# Patient Record
Sex: Female | Born: 1945 | Race: White | Hispanic: No | Smoking: Never smoker
Health system: Southern US, Community
[De-identification: ages and names within clinical notes are randomized; demographics above are authoritative.]

## PROBLEM LIST (undated history)

## (undated) ENCOUNTER — Emergency Department (HOSPITAL_COMMUNITY): Disposition: A | Discharge status: Other Acute Inpatient Hospital | Payer: Medicare Other

## (undated) DIAGNOSIS — F411 Generalized anxiety disorder: Secondary | ICD-10-CM

## (undated) DIAGNOSIS — R079 Chest pain, unspecified: Secondary | ICD-10-CM

## (undated) DIAGNOSIS — Z860100 Personal history of colon polyps, unspecified: Secondary | ICD-10-CM

## (undated) DIAGNOSIS — E785 Hyperlipidemia, unspecified: Secondary | ICD-10-CM

## (undated) DIAGNOSIS — S065X9A Traumatic subdural hemorrhage with loss of consciousness of unspecified duration, initial encounter: Secondary | ICD-10-CM

## (undated) DIAGNOSIS — I10 Essential (primary) hypertension: Secondary | ICD-10-CM

## (undated) DIAGNOSIS — I2510 Atherosclerotic heart disease of native coronary artery without angina pectoris: Secondary | ICD-10-CM

## (undated) DIAGNOSIS — M810 Age-related osteoporosis without current pathological fracture: Secondary | ICD-10-CM

## (undated) HISTORY — DX: Chest pain, unspecified: R07.9

## (undated) HISTORY — PX: NASAL SINUS SURGERY: SHX719

## (undated) HISTORY — DX: Personal history of colon polyps, unspecified: Z86.0100

## (undated) HISTORY — DX: Personal history of colonic polyps: Z86.010

## (undated) HISTORY — DX: Essential (primary) hypertension: I10

## (undated) HISTORY — PX: POLYPECTOMY: SHX149

## (undated) HISTORY — DX: Atherosclerotic heart disease of native coronary artery without angina pectoris: I25.10

## (undated) HISTORY — DX: Generalized anxiety disorder: F41.1

## (undated) HISTORY — DX: Hyperlipidemia, unspecified: E78.5

## (undated) HISTORY — DX: Age-related osteoporosis without current pathological fracture: M81.0

---
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
## 2005-05-21 ENCOUNTER — Encounter: Payer: Self-pay | Admitting: Gastroenterology

## 2006-01-26 ENCOUNTER — Encounter: Admission: RE | Admit: 2006-01-26 | Discharge: 2006-01-26 | Payer: Self-pay | Admitting: Family Medicine

## 2007-02-08 ENCOUNTER — Encounter: Admission: RE | Admit: 2007-02-08 | Discharge: 2007-02-08 | Payer: Self-pay | Admitting: Family Medicine

## 2007-07-27 ENCOUNTER — Inpatient Hospital Stay (HOSPITAL_COMMUNITY): Admission: EM | Admit: 2007-07-27 | Discharge: 2007-08-02 | Payer: Self-pay | Admitting: Emergency Medicine

## 2007-07-27 ENCOUNTER — Ambulatory Visit: Payer: Self-pay | Admitting: Cardiothoracic Surgery

## 2007-07-27 ENCOUNTER — Encounter (INDEPENDENT_AMBULATORY_CARE_PROVIDER_SITE_OTHER): Payer: Self-pay | Admitting: Cardiology

## 2007-07-28 ENCOUNTER — Encounter: Payer: Self-pay | Admitting: Cardiothoracic Surgery

## 2007-08-18 ENCOUNTER — Encounter (HOSPITAL_COMMUNITY): Admission: RE | Admit: 2007-08-18 | Discharge: 2007-10-19 | Payer: Self-pay | Admitting: *Deleted

## 2007-08-26 ENCOUNTER — Ambulatory Visit: Payer: Self-pay | Admitting: Cardiothoracic Surgery

## 2007-08-26 ENCOUNTER — Encounter: Admission: RE | Admit: 2007-08-26 | Discharge: 2007-08-26 | Payer: Self-pay | Admitting: Cardiothoracic Surgery

## 2007-10-20 ENCOUNTER — Encounter (HOSPITAL_COMMUNITY): Admission: RE | Admit: 2007-10-20 | Discharge: 2008-01-18 | Payer: Self-pay | Admitting: *Deleted

## 2007-10-20 HISTORY — PX: CORONARY ARTERY BYPASS GRAFT: SHX141

## 2008-01-10 ENCOUNTER — Encounter: Payer: Self-pay | Admitting: Cardiology

## 2008-02-09 ENCOUNTER — Encounter: Admission: RE | Admit: 2008-02-09 | Discharge: 2008-02-09 | Payer: Self-pay | Admitting: Family Medicine

## 2008-06-27 ENCOUNTER — Ambulatory Visit: Payer: Self-pay | Admitting: Gastroenterology

## 2008-06-27 DIAGNOSIS — K5900 Constipation, unspecified: Secondary | ICD-10-CM

## 2008-06-27 DIAGNOSIS — Z860100 Personal history of colon polyps, unspecified: Secondary | ICD-10-CM | POA: Insufficient documentation

## 2008-06-27 LAB — CONVERTED CEMR LAB: TSH: 2.16 microintl units/mL (ref 0.35–5.50)

## 2008-07-04 ENCOUNTER — Encounter: Payer: Self-pay | Admitting: Gastroenterology

## 2008-07-06 ENCOUNTER — Telehealth: Payer: Self-pay | Admitting: Gastroenterology

## 2008-08-06 ENCOUNTER — Ambulatory Visit: Payer: Self-pay | Admitting: Gastroenterology

## 2008-10-19 HISTORY — PX: CORONARY ANGIOPLASTY WITH STENT PLACEMENT: SHX49

## 2009-02-11 ENCOUNTER — Encounter: Admission: RE | Admit: 2009-02-11 | Discharge: 2009-02-11 | Payer: Self-pay | Admitting: Family Medicine

## 2009-04-03 ENCOUNTER — Encounter: Payer: Self-pay | Admitting: Cardiology

## 2009-04-04 ENCOUNTER — Encounter: Payer: Self-pay | Admitting: Cardiology

## 2009-05-01 ENCOUNTER — Encounter: Payer: Self-pay | Admitting: Cardiology

## 2009-08-01 ENCOUNTER — Ambulatory Visit: Payer: Self-pay | Admitting: Cardiology

## 2009-08-01 DIAGNOSIS — E785 Hyperlipidemia, unspecified: Secondary | ICD-10-CM | POA: Insufficient documentation

## 2009-08-01 DIAGNOSIS — I2510 Atherosclerotic heart disease of native coronary artery without angina pectoris: Secondary | ICD-10-CM

## 2009-08-01 DIAGNOSIS — I10 Essential (primary) hypertension: Secondary | ICD-10-CM

## 2009-08-05 ENCOUNTER — Inpatient Hospital Stay (HOSPITAL_BASED_OUTPATIENT_CLINIC_OR_DEPARTMENT_OTHER): Admission: RE | Admit: 2009-08-05 | Discharge: 2009-08-05 | Payer: Self-pay | Admitting: Cardiology

## 2009-08-05 ENCOUNTER — Ambulatory Visit: Payer: Self-pay | Admitting: Cardiology

## 2009-08-06 ENCOUNTER — Telehealth: Payer: Self-pay | Admitting: Cardiology

## 2009-08-07 LAB — CONVERTED CEMR LAB
BUN: 15 mg/dL (ref 6–23)
Basophils Relative: 0.6 % (ref 0.0–3.0)
CO2: 30 meq/L (ref 19–32)
Creatinine, Ser: 1 mg/dL (ref 0.4–1.2)
Eosinophils Relative: 2.9 % (ref 0.0–5.0)
Glucose, Bld: 92 mg/dL (ref 70–99)
Lymphocytes Relative: 32.8 % (ref 12.0–46.0)
Lymphs Abs: 2.3 10*3/uL (ref 0.7–4.0)
MCHC: 34.1 g/dL (ref 30.0–36.0)
MCV: 94.3 fL (ref 78.0–100.0)
Monocytes Relative: 8 % (ref 3.0–12.0)
Neutro Abs: 3.8 10*3/uL (ref 1.4–7.7)
Neutrophils Relative %: 55.7 % (ref 43.0–77.0)
RDW: 12 % (ref 11.5–14.6)
Sodium: 143 meq/L (ref 135–145)

## 2009-08-09 ENCOUNTER — Ambulatory Visit: Payer: Self-pay | Admitting: Cardiology

## 2009-08-09 ENCOUNTER — Inpatient Hospital Stay (HOSPITAL_COMMUNITY): Admission: RE | Admit: 2009-08-09 | Discharge: 2009-08-10 | Payer: Self-pay | Admitting: Cardiology

## 2009-08-16 ENCOUNTER — Telehealth (INDEPENDENT_AMBULATORY_CARE_PROVIDER_SITE_OTHER): Payer: Self-pay | Admitting: *Deleted

## 2009-08-19 ENCOUNTER — Telehealth: Payer: Self-pay | Admitting: Cardiology

## 2009-08-19 ENCOUNTER — Telehealth (INDEPENDENT_AMBULATORY_CARE_PROVIDER_SITE_OTHER): Payer: Self-pay | Admitting: Physician Assistant

## 2009-08-20 ENCOUNTER — Telehealth: Payer: Self-pay | Admitting: Cardiology

## 2009-08-26 ENCOUNTER — Encounter (INDEPENDENT_AMBULATORY_CARE_PROVIDER_SITE_OTHER): Payer: Self-pay | Admitting: *Deleted

## 2009-08-27 ENCOUNTER — Encounter: Payer: Self-pay | Admitting: Physician Assistant

## 2009-08-27 ENCOUNTER — Ambulatory Visit: Payer: Self-pay | Admitting: Cardiology

## 2009-08-27 DIAGNOSIS — R079 Chest pain, unspecified: Secondary | ICD-10-CM

## 2009-08-28 ENCOUNTER — Inpatient Hospital Stay (HOSPITAL_COMMUNITY): Admission: RE | Admit: 2009-08-28 | Discharge: 2009-08-29 | Payer: Self-pay | Admitting: Cardiovascular Disease

## 2009-08-28 ENCOUNTER — Ambulatory Visit: Payer: Self-pay | Admitting: Cardiovascular Disease

## 2009-08-28 LAB — CONVERTED CEMR LAB
CO2: 29 meq/L (ref 19–32)
Calcium: 9.5 mg/dL (ref 8.4–10.5)
GFR calc non Af Amer: 40.29 mL/min (ref >60)
Glucose, Bld: 102 mg/dL — ABNORMAL HIGH (ref 70–99)

## 2009-08-30 ENCOUNTER — Encounter: Payer: Self-pay | Admitting: Cardiology

## 2009-09-17 ENCOUNTER — Ambulatory Visit: Payer: Self-pay | Admitting: Cardiovascular Disease

## 2009-09-17 ENCOUNTER — Encounter: Payer: Self-pay | Admitting: Cardiology

## 2009-09-17 ENCOUNTER — Encounter: Payer: Self-pay | Admitting: Nurse Practitioner

## 2009-09-18 ENCOUNTER — Telehealth: Payer: Self-pay | Admitting: Gastroenterology

## 2009-10-19 ENCOUNTER — Encounter (HOSPITAL_COMMUNITY): Admission: RE | Admit: 2009-10-19 | Discharge: 2010-01-17 | Payer: Self-pay | Admitting: Cardiology

## 2009-10-23 ENCOUNTER — Encounter: Payer: Self-pay | Admitting: Cardiology

## 2009-10-30 ENCOUNTER — Encounter: Payer: Self-pay | Admitting: Cardiology

## 2009-11-07 ENCOUNTER — Encounter: Payer: Self-pay | Admitting: Cardiology

## 2009-11-13 ENCOUNTER — Telehealth (INDEPENDENT_AMBULATORY_CARE_PROVIDER_SITE_OTHER): Payer: Self-pay | Admitting: Physician Assistant

## 2009-12-17 ENCOUNTER — Encounter: Payer: Self-pay | Admitting: Cardiology

## 2009-12-23 ENCOUNTER — Encounter: Payer: Self-pay | Admitting: Cardiology

## 2009-12-24 ENCOUNTER — Ambulatory Visit: Payer: Self-pay | Admitting: Cardiology

## 2009-12-24 DIAGNOSIS — F411 Generalized anxiety disorder: Secondary | ICD-10-CM

## 2010-02-12 ENCOUNTER — Encounter: Admission: RE | Admit: 2010-02-12 | Discharge: 2010-02-12 | Payer: Self-pay | Admitting: Family Medicine

## 2010-02-17 ENCOUNTER — Ambulatory Visit: Payer: Self-pay | Admitting: Cardiovascular Disease

## 2010-02-17 ENCOUNTER — Inpatient Hospital Stay (HOSPITAL_COMMUNITY): Admission: EM | Admit: 2010-02-17 | Discharge: 2010-02-18 | Payer: Self-pay | Admitting: Emergency Medicine

## 2010-03-24 ENCOUNTER — Encounter: Payer: Self-pay | Admitting: Cardiology

## 2010-03-25 ENCOUNTER — Ambulatory Visit: Payer: Self-pay | Admitting: Cardiology

## 2010-04-03 ENCOUNTER — Encounter: Payer: Self-pay | Admitting: Cardiology

## 2010-07-22 ENCOUNTER — Encounter: Payer: Self-pay | Admitting: Cardiology

## 2010-08-12 ENCOUNTER — Telehealth: Payer: Self-pay | Admitting: Cardiology

## 2010-09-25 ENCOUNTER — Ambulatory Visit: Payer: Self-pay | Admitting: Cardiology

## 2010-09-25 ENCOUNTER — Encounter: Payer: Self-pay | Admitting: Cardiology

## 2010-10-17 ENCOUNTER — Telehealth: Payer: Self-pay | Admitting: Cardiology

## 2010-11-09 ENCOUNTER — Encounter: Payer: Self-pay | Admitting: Cardiothoracic Surgery

## 2010-11-18 NOTE — Miscellaneous (Signed)
Clinical Lists Changes  Observations: Added new observation of CARDCATHFIND:  1. Coronary artery status post prior coronary bypass graft surgery and       prior percutaneous coronary interventions as described above.   2. Severe native vessel disease with 50% to 70% ostial and 80%       stenosis in the proximal LAD, 0% stenosis at the stent site in the       marginal branch of the circumflex artery, and 40% proximal and 50%       stenosis in the mid right coronary artery.   3. Occluded vein graft to the right coronary artery (old), occluded       vein graft to the circumflex artery (old), patent vein graft to       diagonal branch of the left anterior descending with 30% narrowing       within the stent in the proximal portion of the vein graft, and       patent but small-caliber left internal mammary artery graft to the       left anterior descending.   4. Normal left ventricular function.      RECOMMENDATIONS:  There was no clear source of ischemia.  His symptoms   are somewhat atypical and I suspect in view of these findings they are   probably not ischemic.  We will plan reassurance and search for further   etiology of her symptoms if they persist.           (02/18/2010 9:21) Added new observation of CXR RESULTS:  Cardiomediastinal silhouette is stable.  Status post CABG   again noted.  No acute infiltrate or pleural effusion.  Thoracic   spine osteopenia. No pulmonary edema.    IMPRESSION:   No active disease.  Status post CABG .  (02/17/2010 9:21)      Cardiac Cath  Procedure date:  02/18/2010  Findings:       1. Coronary artery status post prior coronary bypass graft surgery and       prior percutaneous coronary interventions as described above.   2. Severe native vessel disease with 50% to 70% ostial and 80%       stenosis in the proximal LAD, 0% stenosis at the stent site in the       marginal branch of the circumflex artery, and 40% proximal and 50%   stenosis in the mid right coronary artery.   3. Occluded vein graft to the right coronary artery (old), occluded       vein graft to the circumflex artery (old), patent vein graft to       diagonal branch of the left anterior descending with 30% narrowing       within the stent in the proximal portion of the vein graft, and       patent but small-caliber left internal mammary artery graft to the       left anterior descending.   4. Normal left ventricular function.      RECOMMENDATIONS:  There was no clear source of ischemia.  His symptoms   are somewhat atypical and I suspect in view of these findings they are   probably not ischemic.  We will plan reassurance and search for further   etiology of her symptoms if they persist.            CXR  Procedure date:  02/17/2010  Findings:       Cardiomediastinal silhouette is stable.  Status post CABG  again noted.  No acute infiltrate or pleural effusion.  Thoracic   spine osteopenia. No pulmonary edema.    IMPRESSION:   No active disease.  Status post CABG .

## 2010-11-18 NOTE — Progress Notes (Signed)
Summary: going to [place] - needs records to take with her  Phone Note Call from Patient Call back at [facility] Phone [phone]   Caller: Patient Reason for Call: Talk to Nurse Initial call taken by: Judie Grieve,  August 12, 2010 2:36 PM  Follow-up for Phone Call        appt in 09/2010 for 6 month appt.  leaving for [place] on the 1st of November, would like records to take with her.  Instructed pt to come by office and sign a release of information form for records.  Kim in Medical Records aware and will have them ready for her. Follow-up by: Charolotte Capuchin, RN,  August 12, 2010 4:41 PM

## 2010-11-18 NOTE — Letter (Signed)
Summary: [facility]  [facility]   Imported By: Debby Freiberg 04/16/2010 14:42:02  _____________________________________________________________________  External Attachment:    Type:   Image     Comment:   External Document

## 2010-11-18 NOTE — Progress Notes (Signed)
Summary: DISCOMFORT  Phone Note From Other Clinic   Call For: Dr Rollene Rotunda Summary of Call: Pt c/o atypical cp intermittently. Maria at cardiac rehab requested [facility] be made aware. She will fax over request for pt to get a call and discuss this. No Sx currently. Initial call taken by: Park Breed PA-C,  November 13, 2009 3:47 PM     Appended Document:  We will call the patient to see how she is doing.  Appended Document:  SPOKE WITH PT - STATES SHE HAS BEEN FEELING MORE TIRED OVER THE PAST 2 TO 3 WEEKS, SHE IS HAVING A SQUEEZING SENSATION MOSTLY AT NIGHT WHEN SHE LAYS DOWN BUT DOES KNOW SHE HAS MUSCLE SPASMS.  SHE FEELS SO THOUGH HER HR IS GOING UP AT REST  BUT STATES SYMPTOMS GETS BETTER WITH WALKING.  THE SQUEEZING FEELING HAS BEEN GOING ON SINCE HER BYPASS SURGERY.  SHE IS MOST CONCERNED ABOUT THE FATIGUE.  SHE WOULD ALSO LIKE TO KNOW IF SHE CAN SWIM.    Appended Document: DISCOMFORT 11/14/09  7pm  I spoke with the patient tonight.  She is not having discomfort currently.  Her symptoms are unlike her angina.  I did review cardiac rehab records and she has been having PACs which is part of her symptoms.  At this point I don't think further testing is indicated.

## 2010-11-18 NOTE — Letter (Signed)
[facility]   Imported By: Debby Freiberg 04/16/2010 14:41:25  _____________________________________________________________________  External Attachment:    Type:   Image     Comment:   External Document

## 2010-11-18 NOTE — Miscellaneous (Signed)
Summary: [facility]   [facility]   Imported By: Roderic Ovens 11/08/2009 11:32:12  _____________________________________________________________________  External Attachment:    Type:   Image     Comment:   External Document

## 2010-11-18 NOTE — Letter (Signed)
Summary: [facility]   Imported By: Kassie Mends 12/09/2009 09:34:47  _____________________________________________________________________  External Attachment:    Type:   Image     Comment:   External Document

## 2010-11-18 NOTE — Letter (Signed)
[facility]   Imported By: Marylou Mccoy 12/05/2009 12:46:37  _____________________________________________________________________  External Attachment:    Type:   Image     Comment:   External Document

## 2010-11-18 NOTE — Assessment & Plan Note (Signed)
Summary: 3 month rov/sl   Visit Type:  Follow-up Primary Provider:  Rudi Heap, MD  CC:  CAD.  History of Present Illness: The patient presents for followup of her coronary disease. Since I last saw her she has continued to get episodes at the same time each month of heart racing and some discomfort. She actually was treating this with St. John's wort and 2 she became concerned about using this. She would take nitroglycerin at times without improvement. These would not happen with exertion but would occur at rest. She had symptoms for days at a time. None of her symptoms were similar to her previous angina. She is not describing classic substernal pressure, neck or arm discomfort. She is not describing new shortness of breath, PND or orthopnea. She had no presyncope or syncope. She seems to have quite a bit of stress and discusses this today. She is doing cardiac rehabilitation and enjoys it but gets stressed about getting there 3 times a week for instance.  Current Medications (verified): 1)  Crestor 5 Mg Tabs (Rosuvastatin Calcium) .... Once Daily 2)  Metoprolol Tartrate 25 Mg Tabs (Metoprolol Tartrate) .... Two Times A Day 3)  Cozaar 25 Mg Tabs (Losartan Potassium) .... Once Daily 4)  Aspirin Ec 325 Mg Tbec (Aspirin) .... Once Daily 5)  Beano  Tabs (Alpha-D-Galactosidase) .... Take 2 Three Times A Day 6)  Benadryl 25 Mg Caps (Diphenhydramine Hcl) .... As Needed 7)  Fexofenadine Hcl 180 Mg Tabs (Fexofenadine Hcl) .... As Needed 8)  Zetia 10 Mg Tabs (Ezetimibe) .... Daily 9)  Vitamin D3 2000 Unit Caps (Cholecalciferol) .... Daily 10)  Fish Oil   Oil (Fish Oil) .... Two Times A Day 11)  Flax Seed Oil 1000 Mg Caps (Flaxseed (Linseed)) .... Daily 12)  Vitamin B Complex-C   Caps (B Complex-C) .... 3 Times Weekly 13)  Miralax  Powd (Polyethylene Glycol 3350) .... 1/2 Daily 14)  Plavix 75 Mg Tabs (Clopidogrel Bisulfate) .... Take One Daily 15)  Acid Reducer 75 Mg Tabs (Ranitidine Hcl) .Marland Kitchen.. 1  By Mouth Daily  Allergies (verified): 1)  ! Sulfa 2)  ! * Bactrin 3)  ! Prednisone  Past History:  Past Medical History: Arrhythmia Coronary Artery Disease      a. CABG x 4 07/2007 - left IMA grafting to the LAD and vein grafts to the right coronary, diagonal, and OM.      b. 07/2009 - PCI of native OM - 2.25 x 23-mm Cypher           c. 08/2009 - s/p PCI of VG-D1: 2.25- x 23-mm Cypher DES Hyperlipidemia x 12 years Fructose intolerance HTN   Past Surgical History: Reviewed history from 08/01/2009 and no changes required. CABG (October 2008 LIMA to the LAD, SVG to RCA, SVG to diagonal and SVG to OM1) Sinus surgery Polypectomy  Review of Systems       As stated in the HPI and negative for all other systems.   Vital Signs:  Patient profile:   65 year old female Height:      65 inches Weight:      149 pounds BMI:     24.88 Pulse rate:   64 / minute Resp:     16 per minute BP sitting:   102 / 68  (right arm)  Vitals Entered By: Marrion Coy, CNA (December 24, 2009 2:08 PM)  Physical Exam  General:  Well developed, well nourished, in no acute distress. Head:  HEENT:  Normal Eyes:  PERRLA/EOM intact; conjunctiva and lids normal. Mouth:  Teeth, gums and palate normal. Oral mucosa normal. Neck:  supple without bruits or JVD Chest Wall:  well-healed sternotomy scar Lungs:  Clear bilaterally to auscultation and percussion. Abdomen:  Bowel sounds positive; abdomen soft and non-tender without masses, organomegaly, or hernias noted. No hepatosplenomegaly. Msk:  Back normal, normal gait. Muscle strength and tone normal. Extremities:  No clubbing or cyanosis. Neurologic:  Alert and oriented x 3. Skin:  Intact without lesions or rashes. Cervical Nodes:  no significant adenopathy Axillary Nodes:  no significant adenopathy Inguinal Nodes:  no significant adenopathy Psych:  Normal affect.   Detailed Cardiovascular Exam  Neck    Carotids: Carotids full and equal bilaterally  without bruits.      Neck Veins: Normal, no JVD.    Heart    Inspection: no deformities or lifts noted.      Palpation: normal PMI with no thrills palpable.      Auscultation: regular rate and rhythm, S1, S2 without murmurs, rubs, gallops, or clicks.    Vascular    Abdominal Aorta: no palpable masses, pulsations, or audible bruits.      Femoral Pulses: normal femoral pulses bilaterally.      Pedal Pulses: pulses normal in all 4 extremities    Radial Pulses: normal radial pulses bilaterally.      Peripheral Circulation: no clubbing, cyanosis, or edema noted with normal capillary refill.     EKG  Procedure date:  12/24/2009  Findings:      sinus rhythm, rate 64, axis within normal limits, intervals within normal limits, no acute ST-T wave changes.  Impression & Recommendations:  Problem # 1:  CHEST PAIN UNSPECIFIED (ICD-786.50) The patient continues to have atypical chest discomfort not like previous angina. It improves with St. John's wort. A cousin from [place] sent to her a drug that she thought she should be taking which she described as a "miracle drug". This turned out to be Plavix which she is already on. We discussed this discomfort at length. At this point I have no suspicion of ongoing angina. No further testing is indicated. She should continue with risk reduction. Orders: EKG w/ Interpretation (93000)  Problem # 2:  ESSENTIAL HYPERTENSION, BENIGN (ICD-401.1) Her blood pressure is controlled and she will continue the meds as listed.  Problem # 3:  DYSLIPIDEMIA (ICD-272.4) I reviewed her lipid profile. This was done recently with an HDL of 52 and an LDL of 66. She can continue the regimen as listed.  Problem # 4:  ANXIETY STATE, UNSPECIFIED (ICD-300.00) She does not admit to or acknowledge this as a possibility. It is clear however that she is anxious. We discussed relaxation techniques.  Patient Instructions: 1)  Your physician recommends that you schedule a follow-up  appointment in: 6 months with Dr Antoine Poche 2)  Your physician recommends that you continue on your current medications as directed. Please refer to the Current Medication list given to you today.

## 2010-11-18 NOTE — Assessment & Plan Note (Signed)
Summary: 6 month rov 414.01  pfh,rn   Visit Type:  Follow-up Primary Provider:  Rudi Heap, MD  CC:  CAD.  History of Present Illness: The patient presents for follow up.  Since I last saw her she has had no throat or cardiovascular complaints. She remains active. She was walking. She is no longer having chest pain that she had previously. She denies any neck or arm discomfort. She has had no new palpitations, presyncope or syncope. She has no shortness of breath, PND or orthopnea.   Current Medications (verified): 1)  Crestor 5 Mg Tabs (Rosuvastatin Calcium) .Marland Kitchen.. 1 1/2 By Mouth Daily 2)  Metoprolol Tartrate 25 Mg Tabs (Metoprolol Tartrate) .Marland Kitchen.. 1 By Mouth Three Times A Day 3)  Cozaar 25 Mg Tabs (Losartan Potassium) .... Once Daily 4)  Aspirin Ec 325 Mg Tbec (Aspirin) .... Once Daily 5)  Beano  Tabs (Alpha-D-Galactosidase) .... Take 2 Three Times A Day 6)  Benadryl 25 Mg Caps (Diphenhydramine Hcl) .... As Needed 7)  Fexofenadine Hcl 180 Mg Tabs (Fexofenadine Hcl) .... As Needed 8)  Zetia 10 Mg Tabs (Ezetimibe) .... Daily 9)  Vitamin D3 2000 Unit Caps (Cholecalciferol) .... Daily 10)  Fish Oil   Oil (Fish Oil) .... Two Times A Day 11)  Flax Seed Oil 1000 Mg Caps (Flaxseed (Linseed)) .... Daily 12)  Vitamin B Complex-C   Caps (B Complex-C) .... 3 Times Weekly 13)  Miralax  Powd (Polyethylene Glycol 3350) .... 1/2 Daily 14)  Plavix 75 Mg Tabs (Clopidogrel Bisulfate) .... Take One Daily 15)  Protonix 40 Mg Tbec (Pantoprazole Sodium) .Marland Kitchen.. 1 By Mouth Dialy 16)  Calcium Carbonate 600 Mg Tabs (Calcium Carbonate) .Marland Kitchen.. 1 By Mouth Dialyu 17)  Nitrostat 0.4 Mg Subl (Nitroglycerin) .Marland Kitchen.. 1 By Mouth As Needed For Chest Pain As Directed  Allergies (verified): 1)  ! Sulfa 2)  ! * Bactrin 3)  ! Prednisone  Past History:  Past Medical History: Reviewed history from 12/24/2009 and no changes required. Arrhythmia Coronary Artery Disease      a. CABG x 4 07/2007 - left IMA grafting to the LAD  and vein grafts to the right coronary, diagonal, and OM.      b. 07/2009 - PCI of native OM - 2.25 x 23-mm Cypher           c. 08/2009 - s/p PCI of VG-D1: 2.25- x 23-mm Cypher DES Hyperlipidemia x 12 years Fructose intolerance HTN   Past Surgical History: Reviewed history from 08/01/2009 and no changes required. CABG (October 2008 LIMA to the LAD, SVG to RCA, SVG to diagonal and SVG to OM1) Sinus surgery Polypectomy  Review of Systems       As stated in the HPI and negative for all other systems.   Vital Signs:  Patient profile:   65 year old female Height:      65 inches Weight:      152 pounds BMI:     25.39 Pulse rate:   57 / minute Resp:     16 per minute BP sitting:   142 / 78  (right arm)  Vitals Entered By: Marrion Coy, CNA (September 25, 2010 9:42 AM)  Physical Exam  General:  Well developed, well nourished, in no acute distress. Head:  HEENT: Normal Eyes:  PERRLA/EOM intact; conjunctiva and lids normal. Mouth:  Teeth, gums and palate normal. Oral mucosa normal. Neck:  supple without bruits or JVD Chest Wall:  well-healed sternotomy scar Lungs:  Clear bilaterally  to auscultation and percussion. Abdomen:  Bowel sounds positive; abdomen soft and non-tender without masses, organomegaly, or hernias noted. No hepatosplenomegaly. Msk:  Back normal, normal gait. Muscle strength and tone normal. Extremities:  No clubbing or cyanosis. Neurologic:  Alert and oriented x 3. Skin:  Intact without lesions or rashes. Cervical Nodes:  no significant adenopathy Inguinal Nodes:  no significant adenopathy Psych:  Normal affect.   Detailed Cardiovascular Exam  Neck    Carotids: Carotids full and equal bilaterally without bruits.      Neck Veins: Normal, no JVD.    Heart    Inspection: no deformities or lifts noted.      Palpation: normal PMI with no thrills palpable.      Auscultation: regular rate and rhythm, S1, S2 without murmurs, rubs, gallops, or clicks.     Vascular    Abdominal Aorta: no palpable masses, pulsations, or audible bruits.      Femoral Pulses: normal femoral pulses bilaterally.      Pedal Pulses: pulses normal in all 4 extremities    Radial Pulses: normal radial pulses bilaterally.      Peripheral Circulation: no clubbing, cyanosis, or edema noted with normal capillary refill.     EKG  Procedure date:  09/25/2010  Findings:      Sinus bradycardia, rate 57, axis rightward, intervals within normal limits, no acute ST-T wave changes.  Impression & Recommendations:  Problem # 1:  CORONARY ATHEROSCLEROSIS NATIVE CORONARY ARTERY (ICD-414.01) She is having no new symptoms. No change in therapy is indicated. Orders: EKG w/ Interpretation (93000)  Problem # 2:  DYSLIPIDEMIA (ICD-272.4) She had an excellent lipid profile when last checked October. She will continue with her current regimen.  Problem # 3:  ESSENTIAL HYPERTENSION, BENIGN (ICD-401.1) Her blood pressure is very slightly elevated today but this was never the case at home Orders: EKG w/ Interpretation (93000)  Patient Instructions: 1)  Your physician recommends that you schedule a follow-up appointment in: 6 months with Dr Antoine Poche 2)  Your physician recommends that you continue on your current medications as directed. Please refer to the Current Medication list given to you today.

## 2010-11-18 NOTE — Assessment & Plan Note (Signed)
Summary: eph/jml   Visit Type:  Follow-up Primary Provider:  Rudi Heap, MD  CC:  CAD.  History of Present Illness: The patient presents for followup of her known coronary disease. Since I last saw her she has been doing much better. She's been ambulating without problems. Is starting to exercise more. She is breathing better. She has more energy. She's not having any chest discomfort, neck or arm discomfort. She is not having palpitations, presyncope or syncope. She's going to start p.o. and start swimming.  Current Medications (verified): 1)  Crestor 5 Mg Tabs (Rosuvastatin Calcium) .Marland Kitchen.. 1 1/4 By Mouth Daily 2)  Metoprolol Tartrate 25 Mg Tabs (Metoprolol Tartrate) .Marland Kitchen.. 1 By Mouth Three Times A Day 3)  Cozaar 25 Mg Tabs (Losartan Potassium) .... Once Daily 4)  Aspirin Ec 325 Mg Tbec (Aspirin) .... Once Daily 5)  Beano  Tabs (Alpha-D-Galactosidase) .... Take 2 Three Times A Day 6)  Benadryl 25 Mg Caps (Diphenhydramine Hcl) .... As Needed 7)  Fexofenadine Hcl 180 Mg Tabs (Fexofenadine Hcl) .... As Needed 8)  Zetia 10 Mg Tabs (Ezetimibe) .... Daily 9)  Vitamin D3 2000 Unit Caps (Cholecalciferol) .... Daily 10)  Fish Oil   Oil (Fish Oil) .... Two Times A Day 11)  Flax Seed Oil 1000 Mg Caps (Flaxseed (Linseed)) .... Daily 12)  Vitamin B Complex-C   Caps (B Complex-C) .... 3 Times Weekly 13)  Miralax  Powd (Polyethylene Glycol 3350) .... 1/2 Daily 14)  Plavix 75 Mg Tabs (Clopidogrel Bisulfate) .... Take One Daily 15)  Protonix 40 Mg Tbec (Pantoprazole Sodium) .Marland Kitchen.. 1 By Mouth Dialy 16)  Calcium Carbonate 600 Mg Tabs (Calcium Carbonate) .Marland Kitchen.. 1 By Mouth Dialyu  Allergies (verified): 1)  ! Sulfa 2)  ! * Bactrin 3)  ! Prednisone  Past History:  Past Medical History: Reviewed history from 12/24/2009 and no changes required. Arrhythmia Coronary Artery Disease      a. CABG x 4 07/2007 - left IMA grafting to the LAD and vein grafts to the right coronary, diagonal, and OM.      b. 07/2009  - PCI of native OM - 2.25 x 23-mm Cypher           c. 08/2009 - s/p PCI of VG-D1: 2.25- x 23-mm Cypher DES Hyperlipidemia x 12 years Fructose intolerance HTN   Past Surgical History: Reviewed history from 08/01/2009 and no changes required. CABG (October 2008 LIMA to the LAD, SVG to RCA, SVG to diagonal and SVG to OM1) Sinus surgery Polypectomy  Review of Systems       As stated in the HPI and negative for all other systems.   Vital Signs:  Patient profile:   65 year old female Height:      65 inches Weight:      148 pounds BMI:     24.72 Pulse rate:   65 / minute Resp:     16 per minute BP sitting:   120 / 60  (right arm)  Vitals Entered By: Marrion Coy, CNA (March 25, 2010 3:31 PM)  Physical Exam  General:  Well developed, well nourished, in no acute distress. Head:  HEENT: Normal Eyes:  PERRLA/EOM intact; conjunctiva and lids normal. Mouth:  Teeth, gums and palate normal. Oral mucosa normal. Neck:  supple without bruits or JVD Chest Wall:  well-healed sternotomy scar Heart:  regular S1, S2, no S3, S4, or murmurs. Abdomen:  Bowel sounds positive; abdomen soft and non-tender without masses, organomegaly, or hernias noted.  No hepatosplenomegaly. Msk:  Back normal, normal gait. Muscle strength and tone normal. Extremities:  No clubbing or cyanosis. Neurologic:  Alert and oriented x 3. Skin:  Intact without lesions or rashes. Cervical Nodes:  no significant adenopathy Psych:  Normal affect.   EKG  Procedure date:  03/25/2010  Findings:      Sinus rhythm, rate 65, axis within normal limits, intervals within normal limits, no acute ST-T wave changes.  Impression & Recommendations:  Problem # 1:  CORONARY ATHEROSCLEROSIS NATIVE CORONARY ARTERY (ICD-414.01) The patient is improved. She will continue with secondary risk reduction. No further testing is suggested. Orders: EKG w/ Interpretation (93000)  Problem # 2:  ANXIETY STATE, UNSPECIFIED (ICD-300.00) Is  interesting that her symptoms improved coincidentally with her making a trip back to [place] to take her mother back with her. The patient's mother had been living with her for 2 months during which time her symptoms were most severe.  Problem # 3:  DYSLIPIDEMIA (ICD-272.4) She has an excellent lipid profile that is followed closely by Dr. Christell Constant.I will defer to his management.  Patient Instructions: 1)  Your physician recommends that you schedule a follow-up appointment in: 6 months with Dr Antoine Poche 2)  Your physician recommends that you continue on your current medications as directed. Please refer to the Current Medication list given to you today.

## 2010-11-20 NOTE — Progress Notes (Signed)
Summary: pt needs to talk about medication  Phone Note Call from Patient Call back at Home Phone [phone]   Caller: Patient Reason for Call: Talk to Nurse, Talk to Doctor Summary of Call: pt is having a problem getting her crestor the Pharm only gave her 10 pills and she needs 45 Initial call taken by: Omer Jack,  October 17, 2010 3:33 PM  Follow-up for Phone Call        pt aware RX was sent into [facility]. Follow-up by: Charolotte Capuchin, RN,  October 17, 2010 5:23 PM    Prescriptions: CRESTOR 5 MG TABS (ROSUVASTATIN CALCIUM) 1 1/2 by mouth daily  #45 x 11   Entered by:   Charolotte Capuchin, RN   Authorized by:   Rollene Rotunda, MD, [facility]   Signed by:   Charolotte Capuchin, RN on 10/17/2010   Method used:   Electronically to        [facility]* (retail)       [address] [place], [place]  [address]       Ph: [phone]       Fax: [phone]   RxID:   [id number]

## 2011-01-06 LAB — COMPREHENSIVE METABOLIC PANEL
ALT: 31 U/L (ref 0–35)
CO2: 29 mEq/L (ref 19–32)
Calcium: 9.2 mg/dL (ref 8.4–10.5)
Creatinine, Ser: 0.85 mg/dL (ref 0.4–1.2)
GFR calc Af Amer: >60 mL/min (ref >60)
GFR calc non Af Amer: >60 mL/min (ref >60)
Glucose, Bld: 83 mg/dL (ref 70–99)
Sodium: 141 mEq/L (ref 135–145)
Total Protein: 6.6 g/dL (ref 6.0–8.3)

## 2011-01-06 LAB — CBC
HCT: 39.2 % (ref 36.0–46.0)
Hemoglobin: 13.9 g/dL (ref 12.0–15.0)
Hemoglobin: 14.2 g/dL (ref 12.0–15.0)
MCHC: 35.4 g/dL (ref 30.0–36.0)
MCHC: 35.5 g/dL (ref 30.0–36.0)
MCV: 93.3 fL (ref 78.0–100.0)
MCV: 93.4 fL (ref 78.0–100.0)
Platelets: 194 10*3/uL (ref 150–400)
RBC: 4.3 MIL/uL (ref 3.87–5.11)
RDW: 12.6 % (ref 11.5–15.5)
WBC: 5.5 10*3/uL (ref 4.0–10.5)

## 2011-01-06 LAB — POCT CARDIAC MARKERS
CKMB, poc: 1.9 ng/mL (ref 1.0–8.0)
CKMB, poc: 1.9 ng/mL (ref 1.0–8.0)
Myoglobin, poc: 62.4 ng/mL (ref 12–200)
Myoglobin, poc: 65.5 ng/mL (ref 12–200)
Troponin i, poc: <0.05 ng/mL (ref 0.00–0.09)

## 2011-01-06 LAB — CARDIAC PANEL(CRET KIN+CKTOT+MB+TROPI)
CK, MB: 2.1 ng/mL (ref 0.3–4.0)
CK, MB: 2.3 ng/mL (ref 0.3–4.0)
Relative Index: 1.9 (ref 0.0–2.5)
Total CK: 109 U/L (ref 7–177)
Total CK: 118 U/L (ref 7–177)

## 2011-01-06 LAB — PROTIME-INR
INR: 0.96 (ref 0.00–1.49)
Prothrombin Time: 12.7 seconds (ref 11.6–15.2)

## 2011-01-06 LAB — DIFFERENTIAL
Eosinophils Relative: 3 % (ref 0–5)
Lymphocytes Relative: 28 % (ref 12–46)
Lymphocytes Relative: 32 % (ref 12–46)
Lymphs Abs: 1.5 10*3/uL (ref 0.7–4.0)
Lymphs Abs: 2 10*3/uL (ref 0.7–4.0)
Monocytes Absolute: 0.6 10*3/uL (ref 0.1–1.0)
Monocytes Relative: 10 % (ref 3–12)
Monocytes Relative: 11 % (ref 3–12)
Neutro Abs: 3.1 10*3/uL (ref 1.7–7.7)
Neutrophils Relative %: 54 % (ref 43–77)

## 2011-01-06 LAB — BASIC METABOLIC PANEL
Chloride: 105 mEq/L (ref 96–112)
Creatinine, Ser: 0.87 mg/dL (ref 0.4–1.2)
GFR calc Af Amer: >60 mL/min (ref >60)
GFR calc non Af Amer: >60 mL/min (ref >60)
Potassium: 4.5 mEq/L (ref 3.5–5.1)
Sodium: 140 mEq/L (ref 135–145)

## 2011-01-06 LAB — BRAIN NATRIURETIC PEPTIDE: Pro B Natriuretic peptide (BNP): 106 pg/mL — ABNORMAL HIGH (ref 0.0–100.0)

## 2011-01-06 LAB — APTT: aPTT: 29 seconds (ref 24–37)

## 2011-01-08 ENCOUNTER — Other Ambulatory Visit: Payer: Self-pay | Admitting: Family Medicine

## 2011-01-08 DIAGNOSIS — Z1231 Encounter for screening mammogram for malignant neoplasm of breast: Secondary | ICD-10-CM

## 2011-01-21 LAB — CARDIAC PANEL(CRET KIN+CKTOT+MB+TROPI)
CK, MB: 1.9 ng/mL (ref 0.3–4.0)
Relative Index: INVALID (ref 0.0–2.5)
Troponin I: 0.03 ng/mL (ref 0.00–0.06)
Troponin I: 0.04 ng/mL (ref 0.00–0.06)

## 2011-01-21 LAB — PROTIME-INR
INR: 0.91 (ref 0.00–1.49)
Prothrombin Time: 12.2 seconds (ref 11.6–15.2)

## 2011-01-21 LAB — CBC
HCT: 36.7 % (ref 36.0–46.0)
Hemoglobin: 13.4 g/dL (ref 12.0–15.0)
MCHC: 34.3 g/dL (ref 30.0–36.0)
MCV: 94.2 fL (ref 78.0–100.0)
Platelets: 195 10*3/uL (ref 150–400)
RBC: 3.9 MIL/uL (ref 3.87–5.11)
RBC: 4.1 MIL/uL (ref 3.87–5.11)
WBC: 4 10*3/uL (ref 4.0–10.5)
WBC: 6.2 10*3/uL (ref 4.0–10.5)

## 2011-01-21 LAB — BASIC METABOLIC PANEL
BUN: 12 mg/dL (ref 6–23)
BUN: 13 mg/dL (ref 6–23)
Calcium: 9.2 mg/dL (ref 8.4–10.5)
Chloride: 111 mEq/L (ref 96–112)
Chloride: 112 mEq/L (ref 96–112)
Creatinine, Ser: 0.93 mg/dL (ref 0.4–1.2)
GFR calc Af Amer: >60 mL/min (ref >60)
GFR calc non Af Amer: >60 mL/min (ref >60)
Potassium: 3.7 mEq/L (ref 3.5–5.1)
Sodium: 140 mEq/L (ref 135–145)

## 2011-01-22 LAB — CBC
HCT: 37.1 % (ref 36.0–46.0)
HCT: 40.2 % (ref 36.0–46.0)
Hemoglobin: 12.9 g/dL (ref 12.0–15.0)
Hemoglobin: 13.9 g/dL (ref 12.0–15.0)
MCHC: 34.8 g/dL (ref 30.0–36.0)
MCV: 94.2 fL (ref 78.0–100.0)
Platelets: 190 10*3/uL (ref 150–400)
RBC: 4.3 MIL/uL (ref 3.87–5.11)
RDW: 12.5 % (ref 11.5–15.5)
RDW: 12.7 % (ref 11.5–15.5)
WBC: 4.8 10*3/uL (ref 4.0–10.5)

## 2011-01-22 LAB — BASIC METABOLIC PANEL
BUN: 14 mg/dL (ref 6–23)
CO2: 27 mEq/L (ref 19–32)
Calcium: 8.9 mg/dL (ref 8.4–10.5)
Chloride: 105 mEq/L (ref 96–112)
GFR calc non Af Amer: >60 mL/min (ref >60)
Glucose, Bld: 90 mg/dL (ref 70–99)
Glucose, Bld: 92 mg/dL (ref 70–99)
Potassium: 3.8 mEq/L (ref 3.5–5.1)
Potassium: 4.4 mEq/L (ref 3.5–5.1)
Sodium: 141 mEq/L (ref 135–145)
Sodium: 141 mEq/L (ref 135–145)

## 2011-02-13 ENCOUNTER — Other Ambulatory Visit: Payer: Self-pay | Admitting: Cardiovascular Disease

## 2011-02-16 ENCOUNTER — Ambulatory Visit
Admission: RE | Admit: 2011-02-16 | Discharge: 2011-02-16 | Disposition: A | Discharge status: Home / Self Care | Payer: Medicare Other | Source: Ambulatory Visit | Attending: Family Medicine | Admitting: Family Medicine

## 2011-02-16 DIAGNOSIS — Z1231 Encounter for screening mammogram for malignant neoplasm of breast: Secondary | ICD-10-CM

## 2011-03-03 NOTE — Consult Note (Signed)
[name], Nancy [name] NO.:  [id number]   MEDICAL RECORD NO.:  [id number]          PATIENT TYPE:  INP   LOCATION:  2914                         FACILITY:  [facility]   PHYSICIAN:  Kerin Perna, M.D.  DATE OF BIRTH:  26-Nov-1945   DATE OF CONSULTATION:  07/27/2007  DATE OF DISCHARGE:                                 CONSULTATION   REQUESTING PHYSICIAN:  1. Ritta Slot, M.D.  2. Dr. Allyson Sabal.   REASON FOR CONSULTATION:  Unstable angina with severe 3-vessel coronary  artery disease.   CHIEF COMPLAINT:  Chest pain.   HISTORY OF PRESENT ILLNESS:  I was asked to evaluate this 65 year old  white female for potential surgical coronary revascularization for  recently diagnosed severe coronary artery disease.  The patient has had  a several week history of exertional chest burning relieved by rest.  A  stress Myoview was performed today, during which the patient developed  severe chest pain radiating into her arms and became very hot and  uncomfortable.  The profusion images were positive for ischemia and she  was brought to the emergency department and was admitted.  She underwent  an urgent left heart cath today by Dr. Lynnea Ferrier, which demonstrated a 99%  stenosis of the OM1, and 80% stenosis of the LAD and a 70% stenosis of  the right coronary.  Her LV ejection fraction was normal and she had no  evidence of mitral regurgitation.  Because of her coronary anatomy and  symptoms, she was felt to be a candidate for surgical revascularization.  She is currently stable on IV heparin protocol in the CCU.   PAST MEDICAL HISTORY:  1. Hypertension.  2. History of rare episodes of TAF.  3. Hyperlipidemia.  4. Status post sinus surgery of no effect.  5. Nonsmoker.  6. Chronic hematuria of unknown etiology.   ALLERGIES:  SULFA.   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  She has lived in the [place] area for over  a  year and a half.  She does not smoke or use alcohol.  She is married  with 2 healthy children.  Her father died at age 22 with heart disease.   REVIEW OF SYSTEMS:  CONSTITUTIONAL:  Negative for fever, weight loss.  HEENT is significant for recent left mandibular crown with a temporary  crown placed at this time.  She denied difficulty swallowing.  Thoracic  review is negative for history of thoracic trauma or history of abnormal  chest x-ray and there is no evidence of recent upper respiratory  infection.  CARDIAC:  Positive for severe coronary artery disease and  mild arrhythmia.  GI:  Positive for constipation, which is a chronic  problem.  She has had a colonoscopy and polypectomy in the past.  No  history of hepatitis, jaundice, or gallstones.  UROLOGIC:  Positive for  chronic hematuria of unknown etiology.  NEUROLOGIC:  Negative for stroke  or seizure.  VASCULAR:  Negative for DVT, claudication, or TIA.  ENDOCRINE:  Negative for diabetes or thyroid disease.  MUSCULOSKELETAL:  Positive for arthritis in her knees and she has had an arthroscopy in  the right knee with removal of torn cartilage 5 to 7 years ago.   PHYSICAL EXAMINATION:  VITAL SIGNS:  She is 5 feet, 5 inches and weighs  145 pounds.  Blood pressure is 130/80, pulse 79, respiratory rate 18.  GENERAL:  Middle aged female in no acute distress.  HEENT:  Normocephalic.  NECK:  No JVD and no carotid bruits.  LYMPHATICS:  No palpable, cervical, or supraclavicular adenopathy.  LUNGS:  Breath sounds are clear and equal.  CARDIAC:  Rhythm is regular without S3, gallop, or murmur.  ABDOMEN:  Soft without pulsatile mass or organomegaly.  EXTREMITIES:  No clubbing, cyanosis, or edema.  Peripheral pulses are 2+  and strong in all extremities.  NEURO:  Intact.  She has a compression dressing on the right groin at  the cardiac catheterization site.   LABORATORY DATA:  Reviewed the coronary arteriograms:  She has a very  tight large  circumflex marginal with moderate stenosis of the LAD and  the right coronary arteries.  Her EF is preserved.   PLAN:  The patient will be prepared for surgical vascularization on this  admission on Friday morning July 29, 2007.  I have discussed the  details of the surgery as well as the alternatives, expected recovery,  benefits and risks for the patient and she understands and agrees.   Thank you very much for the consultation.      Kerin Perna, M.D.  Electronically Signed     PV/MEDQ  D:  07/27/2007  T:  07/28/2007  Job:  [id number]   cc:   [facility] Office  Ines Bloomer, [place]  [facility]  Nanetta Batty, M.D.

## 2011-03-03 NOTE — Assessment & Plan Note (Signed)
OFFICE VISIT   Nancy [name], Nancy [name]  DOB:  August 27, 1946                                        August 26, 2007  CHART #:  [id number]   CURRENT PROBLEMS:  1. Status post CABG x4, July 29, 2007, for class IV unstable angina      with severe three-vessel disease.  2. Hypertension.  3. Chronic hematuria of unknown etiology.   HISTORY OF PRESENT ILLNESS:  The patient is a 65 year old female who  underwent urgent surgical coronary revascularization when she was  admitted to the ER after a positive stress test and found to have severe  three-vessel disease.  She underwent left IMA grafting to the LAD and a  vein graft to the right coronary, diagonal, and OM.  She did well  following surgery, remained in sinus rhythm, and was discharged home on  the 4th postoperative day on Lopressor 25 b.i.d., Crestor 10 mg daily,  aspirin, and a 5 day course of Lasix and potassium.  She has had no  recurrent angina, and she is starting the outpatient rehab program.  The  surgical incisions have healed nicely.   PHYSICAL EXAM:  Blood pressure 101/60, pulse 80, respirations 18,  saturation 95%.  She is alert and pleasant.  Her breath sounds are clear  and equal.  The sternum is stable and well-healed.  The leg incision is  well-healed, and there is no peripheral edema.  Her neurologic exam is  intact.   LABORATORY DATA:  PA and lateral chest x-ray shows clear lung fields  with stable cardiac silhouette.  No pleural effusion, and the sternal  wires are well aligned.   IMPRESSION AND PLAN:  The patient has done well 1 month following  surgery.  She was told she could resume driving and light household  activities.  She knows not to lift more than 20  pounds until 3 months after surgery, and she will continue her current  medications until she checks back with Dr. Jenne Campus.  She will return  here as needed.   Kerin Perna, M.D.  Electronically Signed   PV/MEDQ  D:   08/26/2007  T:  08/27/2007  Job:  [id number]   cc:   Darlin Priestly, MD

## 2011-03-03 NOTE — Cardiovascular Report (Signed)
Nancy [name], STRUCKMAN               ACCOUNT NO.:  [id number]   MEDICAL RECORD NO.:  [id number]          PATIENT TYPE:  INP   LOCATION:  2807                         FACILITY:  [facility]   PHYSICIAN:  Ritta Slot, MD     DATE OF BIRTH:  May 05, 1946   DATE OF PROCEDURE:  07/27/2007  DATE OF DISCHARGE:                            CARDIAC CATHETERIZATION   PROCEDURE PERFORMED:  1. Selective angiography of native coronary circulation by Judkins      technique.  2. Retrograde left heart catheterization.  3. Left ventriculography.  4. Selective visualization of the left internal mammary artery.   COMPLICATIONS:  None.   ENTRY SITE:  Right femoral artery.   1% Xylocaine 20 mL used as local anesthetic.   SEDATION USED:  2 mg of Versed, 25 mcg of fentanyl.   PATIENT PROFILE:  The patient is a 65 year old Caucasian lady with a  history of hypertension and a positive family history of coronary artery  disease who was undergoing a Persantine Myoview Scan at [facility] office.  During the procedure, she had  significant substernal chest pressure and was, therefore, transferred  over to the emergency room here at [facility] for further evaluation.  At that time, it was decided to bring her to the cardiac catheterization  laboratory for further evaluation of her coronary arteries.   RESULTS:  1. Pressures:  The aortic pressure was 119/61/86, the left ventricular      pressure was 135/-12/64, the pullback aortic pressure was      128/60/91.  No aortic valve gradient by pullback technique was      noticed.  2. Angiographic results:  1.  The right coronary artery had a proximal      75% stenosis with a subsequent 60% stenosis just distal to that      lesion.  There was good flow down the right coronary artery with a      patent large right coronary artery supplying the PDA.  This was a      dominant vessel.  2.  The left main artery was short but patent.      3.  The  left anterior descending artery at the ostium of the left      anterior descending where it takes off from the left main coronary      artery, there was a 99% significant stenosis that is approximately      10 mm long.  In addition, following that, there was around a 20 to      30-mm long lesion just distal to in the proximal LAD that is around      80%.  There is good flow down the LAD with TIMI 3 flow.  Left      circumflex system is large.  The left circumflex artery is patent      without any disease, but there is a significant lesion and enlarged      OM at the ostium of the large OM and in the very proximal portion      of the OM and a  99% lesion.  3. LV function:  The left ventricle shows normal contractility at 60%.  4. The left internal mammary artery was patent and free of any      disease, and there was no evidence of significant subclavian artery      stenosis.   IMPRESSION:  A 65 year old female with significant 3-vessel coronary  artery disease, normal left ventricular function with a proximal left  anterior descending/distal left main.  I think she will be best served  by coronary artery  bypass grafting, and I think she needs to stay in the intensive care  unit until that can be performed.  She will be continued on her nitro  drip and restart the heparin drip 6 hours after this procedure.  StarClose  device was inserted to the right groin, and 1 gram of Ancef  was given for antibiotic coverage for the StarClose device.      Ritta Slot, MD  Electronically Signed     HS/MEDQ  D:  07/27/2007  T:  07/28/2007  Job:  [id number]

## 2011-03-03 NOTE — Discharge Summary (Signed)
NAMEBUELAH, RENNIE NO.:  [id number]   MEDICAL RECORD NO.:  [id number]          PATIENT TYPE:  INP   LOCATION:  2008                         FACILITY:  [facility]   PHYSICIAN:  Kerin Perna, M.D.  DATE OF BIRTH:  April 15, 1946   DATE OF ADMISSION:  07/27/2007  DATE OF DISCHARGE:  08/02/2007                               DISCHARGE SUMMARY   HISTORY OF PRESENT ILLNESS:  The patient is a 65 year old female patient  of Dr. Allyson Sabal who has recently been evaluated for chest burning symptoms.  She presented on the date of admission to the [facility] office for a stress Myoview due to these symptoms.  She  was recently in [place] where she developed some difficulty with chest  burning-type sensation with ambulation.  She had the Myoview study and  developed chest pressure.  She had increasing symptoms and was felt to  require further evaluation in the hospital due to findings that were  positive for ischemia.  She was given nitroglycerin and EMS was called  and she was sent to the hospital.  She was felt to require further  evaluation and treatment to include cardiac catheterization.   PAST MEDICAL HISTORY:  Essentially unremarkable.  She did have a short  episode of paroxysmal atrial fibrillation during her stress study  lasting for approximately ten seconds.  Other diagnoses due include:  1. Hypertension.  2. Hyperlipidemia.   PAST SURGICAL HISTORY:  Includes:  1. Sinus surgery in 1995.  2. She has had colon polypectomies in 2001, 2003 and 2006.   OTHER DIAGNOSES:  Include:  1. Eczema.  2. Some visual disorder not delineated on the history and physical.   MEDICATIONS PRIOR TO ADMISSION:  1. Metoprolol 25 at noon and 50 in the evening.  2. Allegra 180 mg p.r.n.  3. Flax seed oil 1300 mg two daily.  4. Melatonin p.r.n.  5. Benadryl p.r.n.  6. Pepto-Bismol p.r.n.  7. Multivitamin.  8. Crestor 10 mg q.a.m.  9. Prevacid 30 mg daily.  10.Stool softener twice daily.   ALLERGIES:  INCLUDE SULFA AND SHE IS INTOLERANT TO FRUCTOSE.   FAMILY HISTORY/SOCIAL HISTORY/REVIEW OF SYMPTOMS/PHYSICAL EXAMINATION:  Please see the history and physical note done at the time of admission.   HOSPITAL COURSE:  Patient was admitted to the ER with unstable angina.  She was taken for cardiac catheterization by Dr. Lynnea Ferrier.  This  demonstrated a 99% stenosis of the OM1 and an 80% stenosis of the LAD  and a 70% stenosis of the right coronary artery.  Her left ventricular  ejection fraction was normal and she had no evidence of mitral  regurgitation.  She was placed on heparin and placed in the CCU.  A  cardiac surgical consultation was obtained with Kerin Perna, MD, who  evaluated the patient's studies and recommend proceeding with surgical  revascularization.   PROCEDURE:  On July 29, 2007, the patient underwent the following  procedure:  Coronary artery bypass grafting x4.  The following grafts  were placed:  1) Left internal mammary artery to the left  anterior  descending, 2) a saphenous vein graft to the right coronary artery, 3)  a saphenous vein graft to the diagonal, and finally 4) a saphenous vein  graft to the obtuse marginal.  Patient tolerated the procedure well and  was taken to the surgical intensive care unit in stable condition.   POSTOPERATIVE HOSPITAL COURSE:  Patient has done quite well.  She has  remained hemodynamically stable.  She has had some tachycardia, but her  beta-blocker has been titrated up.  Her routine lines, monitors and  drainage devices have all been discontinued in the standard fashion.  Her incisions are healing well without signs of infection.  She has had  some mild volume overload, but has tolerated a gentle diuresis.  Her  oxygen has been weaned and she maintains good saturations on room air.  Her laboratory values are stable.  Most recent hemoglobin and hematocrit  dated August 02, 2007 are  12 and 35.4, respectively.  Electrolytes, BUN  and creatinine are all within normal limits.  She is tolerating cardiac  rehabilitation phase one modalities using standard protocols.  Her  overall status is felt to be quite stable for discharge on August 02, 2007.   CONDITION ON DISCHARGE:  Stable and improving.   INSTRUCTIONS:  The patient will receive written instructions in regard  to medications, activity, diet, wound care and followup.   FOLLOWUP:  1. Will include Dr. Donata Clay three weeks; the office will call with      this appointment.  2. Additionally, she is instructed to follow up with Dr. Allyson Sabal in two      weeks.   MEDICATIONS AT THE TIME OF DICTATION:  Will be as follows:  1. For pain, Ultram 50 mg one or two every six hours as needed.  2. Lopressor 25 mg twice daily.  3. Crestor 10 mg daily.  4. Lasix 40 mg daily for five days.  5. K-Dur 20 mEq daily for five days.  6. Allegra p.r.n.   FINAL DIAGNOSIS:  Severe multivessel coronary artery disease now status  post surgical revascularization as described.   OTHER DIAGNOSES:  Include:  1. Hypertension.  2. History of anemia.  3. History of colon polyps.  4. HISTORY OF ENVIRONMENTAL ALLERGIES.  5. History of hyperlipidemia.  6. History of hematuria.  7. HISTORY OF FRUCTOSE INTOLERANCE.  8. HISTORY OF SULFA ALLERGY.  9. History of sinus surgery.  10.History of rare episode of paroxysmal atrial fibrillation.      Rowe Clack, P.A.-C.      Kerin Perna, M.D.  Electronically Signed    WEG/MEDQ  D:  08/02/2007  T:  08/02/2007  Job:  [id number]   cc:   Nanetta Batty, M.D.

## 2011-03-03 NOTE — Op Note (Signed)
NAMECYDNE, Nancy [name] NO.:  [id number]   MEDICAL RECORD NO.:  [id number]          PATIENT TYPE:  INP   LOCATION:  2311                         FACILITY:  [facility]   PHYSICIAN:  Kerin Perna, M.D.  DATE OF BIRTH:  03/24/1946   DATE OF PROCEDURE:  07/29/2007  DATE OF DISCHARGE:                               OPERATIVE REPORT   OPERATION:  1. Coronary artery bypass grafting x4 (left internal mammary artery to      left anterior descending, saphenous vein graft to right coronary      artery, saphenous vein graft to diagonal, saphenous vein graft to      obtuse marginal).  2. Endoscopic vein harvest of the right leg greater saphenous vein.   SURGEON:  Kerin Perna, M.D.   ASSISTANT:  Rowe Clack, P.A.-C.  Rick Arozco, SA   PREOPERATIVE DIAGNOSIS:  Class IV unstable angina with severe three  vessel coronary disease.   POSTOPERATIVE DIAGNOSIS:  Class IV unstable angina with severe three  vessel coronary disease.   ANESTHESIA:  General.   INDICATIONS:  The patient is a 65 year old female who presented with  symptoms of unstable angina.  Cardiac catheterization by Dr. Lynnea Ferrier  demonstrated severe three vessel coronary disease with fairly well  preserved LV function.  She is felt to be a candidate for surgical  evaluation.  I examined the patient in her CCU room and reviewed the  results of the cardiac catheterization with the patient and the family.  I discussed the indications and expected benefits of coronary bypass  surgery for treatment of her coronary artery disease.  I reviewed the  alternatives to surgical therapy, as well.  I discussed with the patient  the major aspects of the planned procedure including the choice of  conduit to include internal mammary artery and endoscopically harvested  saphenous vein, the location of the surgical incisions, the use of  general anesthesia and cardiopulmonary bypass, and the expected  postoperative hospital  recovery.  I reviewed with the patient the risks  to her of coronary artery bypass surgery including the risks of MI, CVA,  bleeding, blood transfusion requirement, infection, and death.  After  reviewing these issues, she demonstrated her understanding and agreed to  proceed with the operation as planned under what I felt was an informed  consent.   OPERATIVE FINDINGS:  The vein was of good quality.  The mammary artery  was small less than 1.5 mm but had good flow.  The patient received 2  units of packed cells while on bypass for a hematocrit of left less than  20.   PROCEDURE:  The patient was brought to operating room and placed supine  on the operating table where general anesthesia was induced under  invasive hemodynamic monitoring.  The chest, abdomen and legs were  prepped with Betadine and draped as a sterile field.  A sternal incision  was made as the saphenous vein was harvested endoscopically from the  right leg.  The left internal mammary artery was harvested as a pedicle  graft from its origin at the  subclavian vessels.  It was a good vessel  with excellent flow.  Heparin was administered and the ACT was  documented as being therapeutic.  The pericardium was opened and  suspended.  Pursestrings were placed in the ascending aorta and right  atrium and the patient was cannulated.  When the vein was inspected and  found to be adequate, the patient was placed on bypass.  The distal  coronaries were dissected and identified.  Cardioplegia catheters were  placed for both antegrade and retrograde cold blood cardioplegia.  The  mammary artery and vein grafts were prepared for the distal anastomoses.  The patient was cooled to 32 degrees.  The aortic crossclamp was applied  and 800 mL of cold blood cardioplegia was delivered in split doses  between antegrade aortic and retrograde coronary sinus catheters.  There  was good cardioplegic arrest and septal temperature dropped to less  than  15 degrees.   The distal coronary anastomoses were then performed.  The first distal  anastomosis was to the right coronary.  This had a proximal 75%  stenosis.  A reverse saphenous vein was sewn end-to-side with running 7-  0 Prolene and there is good flow through graft.  The second distal  anastomosis was placed to the circumflex marginal.  This is a 1.5 mm  vessel with a proximal 95% stenosis.  A reverse saphenous vein was sewn  end-to-side with running 7-0 Prolene.  There was good flow through  graft.  Cardioplegia was redosed.  The third distal anastomosis was to  the first diagonal branch of the LAD.  This had a proximal 70% stenosis.  A reverse saphenous vein was sewn end-to-side with running 7-0 Prolene  and there was good flow through graft.  The fourth distal anastomosis  was to the mid portion of the LAD.  It was a 1.5 mm vessel and had a  proximal 80-90% stenosis.  An incision was made in the left lateral  pericardium and the internal mammary artery pedicle was brought through  the pericardium and brought down onto the LAD and sewn end-to-side with  running 8-0 Prolene.  There was good flow through the anastomosis after  briefly releasing the bulldog pedicle on the mammary artery.  The  bulldog was reapplied and the pedicle was secured to the epicardium.  Cardioplegia was redosed.   While the crossclamp was still in place, three proximal vein anastomoses  were placed on the ascending aorta using a 4 mm punch and running 7-0  Prolene.  Prior to tying down the final proximal anastomosis, air was  vented from the coronaries and the left side of the heart using a dose  of retrograde warm blood cardioplegia.  The final proximal anastomosis  was tied and the crossclamp was removed.   The heart was cardioverted back to a regular rhythm.  Air was aspirated  from the vein grafts with a 27 gauge needle.  The cardioplegia catheters  were removed.  The grafts were checked and  found to be hemostatic at the  proximal and distal anastomoses.  The grafts had good flow.  The patient  was rewarmed to 37 degrees and temporary pacing wires were applied.  The  lungs were re-expanded and the ventilator was resumed.  The patient was  then weaned from bypass on without difficulty.  Cardiac output and blood  pressure were stable.  Protamine was administered without adverse  reaction.  The cannulae were removed and the mediastinum was irrigated  with  warm antibiotic irrigation.  The leg incision was irrigated and  closed in a standard fashion.  The superior pericardial fat was closed  over the aorta and vein grafts.  Two mediastinal and a left pleural  chest tube were placed and brought out through separate incisions.  The  sternum was closed with interrupted steel wire.  The pectoralis fascia  was closed with a running #1 Vicryl.  The subcutaneous and skin layers  were closed using running Vicryl and sterile dressings were applied.  Total bypass time was 122 minutes with crossclamp time of 85 minutes.      Kerin Perna, M.D.  Electronically Signed     PV/MEDQ  D:  07/29/2007  T:  07/30/2007  Job:  [id number]   cc:   [facility]

## 2011-03-03 NOTE — H&P (Signed)
[name], [name]               ACCOUNT NO.:  [id number]   MEDICAL RECORD NO.:  [id number]          PATIENT TYPE:  INP   LOCATION:  1826                         FACILITY:  [facility]   PHYSICIAN:  Nancy Priestly, MD  DATE OF BIRTH:  Aug 30, 1946   DATE OF ADMISSION:  07/27/2007  DATE OF DISCHARGE:                              HISTORY & PHYSICAL   CHIEF COMPLAINT:  Chest pain.   HISTORY OF PRESENT ILLNESS:  This is a 65 year old patient of Dr. Allyson [name],  who had just seen Dr. Allyson [name] the first of last week for chest burning and  presented to our office today for a stress Myoview for those symptoms.  She had been in [place] to get her mother to bring her back here and was  walking at a good clip, not really strenuous, but just good walking and  she developed burning that would resolve with rest.  She originally saw  her primary care PA, Nancy [name], who sent her to Dr. Allyson [name] for a  followup.  Dr. Allyson [name] felt she needed a stress test.  She had it today  and actually she had chest pressure prior to the Myoview and then went  ahead and did it.  It increased with severity, went down her left arm,  her tongue felt heavy, became very hot.  The stress study was also  positive for ischemia.  She was then brought up to the clinical area for  evaluation and plans for heart cath on Friday, but at the end of the  visit she complained of chest pressure.  We gave her one nitroglycerin  sublingual and it initially went away, then it returned with worsening  pain, midsternal down her left arm.  We had her lie down, her blood  pressure went up to 180/90 after the first nitroglycerin, a second nitro  was given and four baby aspirin were chewed.  After the second, her pain  turned into more of just a mild heaviness again.  We went ahead and  called EMS.  The patient contacted her husband and explained the issues  to him and we have sent her to the emergency room and she will need a  heart catheterization  today.   PAST MEDICAL HISTORY:  Essentially unremarkable.  She is fairly healthy.  She had a history of FRUCTOSE ALLERGIES and she has had some PACs as  well.  On monitor she had a short line of PAF lasting 10 seconds.  The  2D echo and a Myoview at that time was normal and that was in October  2007.  She has hypertension as well as hyperlipidemia.  She is on  Crestor and on a beta blocker as well.   Other history includes 1995 sinus surgery and colon polypectomies in  2001, 2003 and 2006.  She also has eczema of the skin and visual  disorder.   FAMILY HISTORY:  Her mother is alive at age 95.  She is currently  staying with her daughter, visiting from [place], and they plan to take  her back November 26 to [place].  Her father  died at 54 with heart  disease.  One brother, in [place], has hypertension.  He is a smoker.  She has two children, healthy.   SOCIAL HISTORY:  Married for 43 years with two children, two  grandchildren.  She is active.  She is a retired Radiation protection practitioner.  She drinks an occasional glass of wine.  No tobacco use, no illicit drug  use.  Does not exercise, but is active.   CURRENT MEDICATIONS:  1. Metoprolol 25 mg in the morning, 25 at noon and 50 in the evening.  2. Allegra 180 mg p.r.n.  3. Flax seed oil 1300 mg two daily.  4. Melatonin p.r.n.  5. Benadryl p.r.n.  6. Pepto Bismol p.r.n.  7. Multivitamin two weekly.  8. Crestor 10 mg every morning.  9. Prevacid 30 mg daily.  10.Stool softener twice a day.   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   REVIEW OF SYSTEMS:  No weight changes, no colds or fevers.  MUSCULOSKELETAL:  No joint pain or muscle pain.  SKIN:  Has eczema, but  no significant rashes currently, no blurred vision.  RESPIRATORY/CARDIOVASCULAR:  As per HPI.  GI:  Frequent constipation,  but no melena, no diarrhea, no indigestion.  GU:  She has chronic  hematuria.  She saw a urologist the last time in 2006.  They  did a  cysto.  They could find no reason for the hematuria and she does need a  followup in the very near future.  HEENT:  She has a temporary crown on  her tooth and needs a permanent crown and that is planned for October  14.  She does have a little looseness of the crown currently present.   PHYSICAL EXAM:  VITAL SIGNS:  Initially, blood pressure in the left arm  is 150/74, weight 145.4.  EKG:  With the stress test, sinus rhythm.  Rate 67.  EKG today in the  office, when her pain became more severe, revealed sinus tachycardia,  rate 111, right atrial enlargement, diffuse STT-wave changes.  ON EXAM:  Alert, oriented, white female, pleasant affect, with  increasing anxiety as her pain increased.  SKIN:  Warm and dry, brisk capillary refill.  SCLERAE:  Clear.  NECK:  Supple.  No JVD, no bruits, no thyromegaly.  No adenopathy.  LUNGS:  Clear, without rales, rhonchi or wheezes.  HEART SOUNDS:  S1, S2, regular rate and rhythm without murmur, gallop,  rub or click.  ABDOMEN:  Soft, nontender.  Positive bowel sounds.  Do not palpate  liver, spleen or masses.  LOWER EXTREMITIES:  Without edema, 2+ pedals bilaterally.  NEUROLOGIC:  Alert and oriented times three.   ASSESSMENT:  1. Now unstable angina.  2. Positive stress test.  3. Previously exertional angina.  4. Hypertension.  5. Hyperlipidemia.  6. She has a temporary crown on one of her molars that is somewhat      loose.  7. History of hematuria.  8. Fructose intolerance.   PLAN:  We had patient call her husband to make him aware that we are  sending her to the hospital, non-emergent, but we did not want her to  drive herself.  She was stable when she left with only minimal  heaviness.  She will begin IV heparin, IV nitroglycerin, serial CK-MBs  and undergo cardiac catheterization today.  I did discuss 1% chance of  heart attack, stroke or death with cardiac catheterization and a chance  of hematoma at the  site.  Dr. Jenne [name]  reviewed this with her, as well.  Dr. Jenne [name] saw her with me and these were his plans.  I am dictating  this for Dr. Jenne [name].      Nancy [name]. Valarie Merino      Nancy Priestly, MD  Electronically Signed    LRI/MEDQ  D:  07/27/2007  T:  07/27/2007  Job:  [id number]   cc:   [id number], [employer]

## 2011-03-19 ENCOUNTER — Encounter: Payer: Self-pay | Admitting: *Deleted

## 2011-03-19 ENCOUNTER — Encounter: Payer: Self-pay | Admitting: Cardiovascular Disease

## 2011-03-20 ENCOUNTER — Encounter: Payer: Self-pay | Admitting: Cardiology

## 2011-03-20 ENCOUNTER — Ambulatory Visit (INDEPENDENT_AMBULATORY_CARE_PROVIDER_SITE_OTHER): Payer: Medicare Other | Admitting: Cardiology

## 2011-03-20 DIAGNOSIS — I10 Essential (primary) hypertension: Secondary | ICD-10-CM

## 2011-03-20 DIAGNOSIS — E785 Hyperlipidemia, unspecified: Secondary | ICD-10-CM

## 2011-03-20 DIAGNOSIS — I2510 Atherosclerotic heart disease of native coronary artery without angina pectoris: Secondary | ICD-10-CM

## 2011-03-20 NOTE — Assessment & Plan Note (Signed)
The blood pressure is at target. No change in medications is indicated. We will continue with therapeutic lifestyle changes (TLC).  

## 2011-03-20 NOTE — Progress Notes (Signed)
HPI The patient presents for 6 month followup. She recently has had some trouble with muscle aches taking her Crestor. This happens periodically she'll stop the Crestor for a while. I did look at her blood work and her LDL was 61 her couple of days ago. Her HDL was excellent. She otherwise has been feeling well. She denies any routine chest discomfort, neck or arm discomfort.  She is exercising.  Allergies  Allergen Reactions  . Morphine And Related   . Prednisone   . Sulfamethoxazole W/Trimethoprim   . Sulfonamide Derivatives     Current Outpatient Prescriptions  Medication Sig Dispense Refill  . Alpha-D-Galactosidase (BEANO PO) Take by mouth daily.        Marland Kitchen aspirin 325 MG tablet Take 325 mg by mouth daily.        . B Complex-C (SUPER B COMPLEX PO) Take by mouth daily.        . calcium carbonate (TUMS) 500 MG chewable tablet Chew 1 tablet by mouth daily.        . Cholecalciferol (VITAMIN D-3 PO) Take by mouth daily.        . diphenhydramine-acetaminophen (TYLENOL PM) 25-500 MG TABS Take 1 tablet by mouth at bedtime as needed.        . ezetimibe (ZETIA) 10 MG tablet Take 10 mg by mouth daily.        . fish oil-omega-3 fatty acids 1000 MG capsule Take 2 g by mouth daily.        Marland Kitchen FLAXSEED, LINSEED, PO Take by mouth daily.        Marland Kitchen losartan (COZAAR) 25 MG tablet Take 25 mg by mouth daily.        . metoprolol succinate (TOPROL-XL) 25 MG 24 hr tablet Take 25 mg by mouth. 3 po daily       . nitroGLYCERIN (NITROSTAT) 0.4 MG SL tablet Place 0.4 mg under the tongue every 5 (five) minutes as needed.        . Nutritional Supplements (CHOICE DM FIBER-BURST PO) Take by mouth daily.        . pantoprazole (PROTONIX) 40 MG tablet Take 40 mg by mouth daily.        Marland Kitchen PLAVIX 75 MG tablet TAKE ONE TABLET BY MOUTH EVERY DAY  30 each  6  . polyethylene glycol (MIRALAX / GLYCOLAX) packet Take 17 g by mouth daily.        . rosuvastatin (CRESTOR) 5 MG tablet Take 5 mg by mouth daily.          Past Medical  History  Diagnosis Date  . DYSLIPIDEMIA   . Anxiety state, unspecified   . Essential hypertension, benign   . CORONARY ATHEROSCLEROSIS NATIVE CORONARY ARTERY   . CHEST PAIN UNSPECIFIED   . COLONIC POLYPS, HX OF     Past Surgical History  Procedure Date  . Coronary artery bypass graft   . Nasal sinus surgery   . Polypectomy     ROS:  As stated in the HPI and negative for all other systems.  PHYSICAL EXAM BP 129/73  Pulse 62  Ht 5\' 5"  (1.651 m)  Wt 150 lb (68.04 kg)  BMI 24.96 kg/m2 GENERAL:  Well appearing HEENT:  Pupils equal round and reactive, fundi not visualized, oral mucosa unremarkable NECK:  No jugular venous distention, waveform within normal limits, carotid upstroke brisk and symmetric, no bruits, no thyromegaly LYMPHATICS:  No cervical, inguinal adenopathy LUNGS:  Clear to auscultation bilaterally BACK:  No CVA  tenderness CHEST:  Well healed sternotomy scar. HEART:  PMI not displaced or sustained,S1 and S2 within normal limits, no S3, no S4, no clicks, no rubs, no murmurs ABD:  Flat, positive bowel sounds normal in frequency in pitch, no bruits, no rebound, no guarding, no midline pulsatile mass, no hepatomegaly, no splenomegaly EXT:  2 plus pulses throughout, no edema, no cyanosis no clubbing SKIN:  No rashes no nodules NEURO:  Cranial nerves II through XII grossly intact, motor grossly intact throughout PSYCH:  Cognitively intact, oriented to person place and time   EKG:  Sinus rhythm, rate 64, axis within normal limits, intervals within normal limits, no acute ST-T wave changes.   ASSESSMENT AND PLAN

## 2011-03-20 NOTE — Patient Instructions (Signed)
Continue current medications Follow up in 6 months with Dr Hochrein 

## 2011-03-20 NOTE — Assessment & Plan Note (Signed)
The patient has no new sypmtoms.  No further cardiovascular testing is indicated.  We will continue with aggressive risk reduction and meds as listed.  

## 2011-03-20 NOTE — Assessment & Plan Note (Signed)
She will continue to take her Crestor as tolerated.

## 2011-04-27 ENCOUNTER — Other Ambulatory Visit: Payer: Self-pay | Admitting: Cardiology

## 2011-06-15 ENCOUNTER — Inpatient Hospital Stay (HOSPITAL_COMMUNITY)
Admission: EM | Admit: 2011-06-15 | Discharge: 2011-06-16 | DRG: 313 | Disposition: A | Discharge status: Home / Self Care | Payer: Medicare Other | Source: Ambulatory Visit | Attending: Cardiology | Admitting: Cardiology

## 2011-06-15 ENCOUNTER — Emergency Department (HOSPITAL_COMMUNITY): Payer: Medicare Other

## 2011-06-15 DIAGNOSIS — G8929 Other chronic pain: Secondary | ICD-10-CM | POA: Diagnosis present

## 2011-06-15 DIAGNOSIS — Z882 Allergy status to sulfonamides status: Secondary | ICD-10-CM

## 2011-06-15 DIAGNOSIS — K219 Gastro-esophageal reflux disease without esophagitis: Secondary | ICD-10-CM | POA: Diagnosis present

## 2011-06-15 DIAGNOSIS — Z9861 Coronary angioplasty status: Secondary | ICD-10-CM

## 2011-06-15 DIAGNOSIS — Z7982 Long term (current) use of aspirin: Secondary | ICD-10-CM

## 2011-06-15 DIAGNOSIS — Z951 Presence of aortocoronary bypass graft: Secondary | ICD-10-CM

## 2011-06-15 DIAGNOSIS — Z8249 Family history of ischemic heart disease and other diseases of the circulatory system: Secondary | ICD-10-CM

## 2011-06-15 DIAGNOSIS — E785 Hyperlipidemia, unspecified: Secondary | ICD-10-CM | POA: Diagnosis present

## 2011-06-15 DIAGNOSIS — M549 Dorsalgia, unspecified: Secondary | ICD-10-CM | POA: Diagnosis present

## 2011-06-15 DIAGNOSIS — E7412 Hereditary fructose intolerance: Secondary | ICD-10-CM | POA: Diagnosis present

## 2011-06-15 DIAGNOSIS — R079 Chest pain, unspecified: Secondary | ICD-10-CM

## 2011-06-15 DIAGNOSIS — Z79899 Other long term (current) drug therapy: Secondary | ICD-10-CM

## 2011-06-15 DIAGNOSIS — Z7902 Long term (current) use of antithrombotics/antiplatelets: Secondary | ICD-10-CM

## 2011-06-15 DIAGNOSIS — G4700 Insomnia, unspecified: Secondary | ICD-10-CM | POA: Diagnosis present

## 2011-06-15 DIAGNOSIS — Z888 Allergy status to other drugs, medicaments and biological substances status: Secondary | ICD-10-CM

## 2011-06-15 DIAGNOSIS — I2510 Atherosclerotic heart disease of native coronary artery without angina pectoris: Secondary | ICD-10-CM | POA: Diagnosis present

## 2011-06-15 DIAGNOSIS — R0789 Other chest pain: Principal | ICD-10-CM | POA: Diagnosis present

## 2011-06-15 DIAGNOSIS — I10 Essential (primary) hypertension: Secondary | ICD-10-CM | POA: Diagnosis present

## 2011-06-15 LAB — COMPREHENSIVE METABOLIC PANEL
ALT: 29 U/L (ref 0–35)
Albumin: 4.2 g/dL (ref 3.5–5.2)
Alkaline Phosphatase: 68 U/L (ref 39–117)
Calcium: 9.6 mg/dL (ref 8.4–10.5)
GFR calc Af Amer: >60 mL/min (ref >60)
Glucose, Bld: 98 mg/dL (ref 70–99)
Potassium: 4.4 mEq/L (ref 3.5–5.1)
Sodium: 142 mEq/L (ref 135–145)
Total Protein: 7.1 g/dL (ref 6.0–8.3)

## 2011-06-15 LAB — CBC
Hemoglobin: 13.6 g/dL (ref 12.0–15.0)
MCH: 31.2 pg (ref 26.0–34.0)
Platelets: 207 10*3/uL (ref 150–400)
RBC: 4.36 MIL/uL (ref 3.87–5.11)
WBC: 5.6 10*3/uL (ref 4.0–10.5)

## 2011-06-15 LAB — PROTIME-INR
INR: 0.94 (ref 0.00–1.49)
Prothrombin Time: 12.8 seconds (ref 11.6–15.2)

## 2011-06-15 LAB — POCT I-STAT TROPONIN I: Troponin i, poc: 0 ng/mL (ref 0.00–0.08)

## 2011-06-15 LAB — TROPONIN I: Troponin I: <0.3 ng/mL (ref <0.30)

## 2011-06-16 ENCOUNTER — Inpatient Hospital Stay (HOSPITAL_COMMUNITY): Payer: Medicare Other

## 2011-06-16 DIAGNOSIS — R079 Chest pain, unspecified: Secondary | ICD-10-CM

## 2011-06-16 LAB — HEPARIN LEVEL (UNFRACTIONATED)
Heparin Unfractionated: 0.87 IU/mL — ABNORMAL HIGH (ref 0.30–0.70)
Heparin Unfractionated: 0.33 IU/mL (ref 0.30–0.70)

## 2011-06-16 LAB — LIPID PANEL
Cholesterol: 153 mg/dL (ref 0–200)
HDL: 46 mg/dL (ref >39)
LDL Cholesterol: 84 mg/dL (ref 0–99)
Triglycerides: 116 mg/dL (ref <150)

## 2011-06-16 LAB — CARDIAC PANEL(CRET KIN+CKTOT+MB+TROPI)
CK, MB: 2.8 ng/mL (ref 0.3–4.0)
Relative Index: INVALID (ref 0.0–2.5)
Total CK: 88 U/L (ref 7–177)
Troponin I: <0.3 ng/mL (ref <0.30)
Troponin I: <0.3 ng/mL (ref <0.30)

## 2011-06-16 LAB — CBC
MCH: 30.8 pg (ref 26.0–34.0)
MCV: 89.2 fL (ref 78.0–100.0)
Platelets: 183 10*3/uL (ref 150–400)
RDW: 12.7 % (ref 11.5–15.5)
WBC: 5.7 10*3/uL (ref 4.0–10.5)

## 2011-06-16 LAB — BASIC METABOLIC PANEL
Calcium: 9.1 mg/dL (ref 8.4–10.5)
GFR calc Af Amer: >60 mL/min (ref >60)
GFR calc non Af Amer: >60 mL/min (ref >60)
Glucose, Bld: 86 mg/dL (ref 70–99)
Potassium: 3.6 mEq/L (ref 3.5–5.1)
Sodium: 144 mEq/L (ref 135–145)

## 2011-06-16 LAB — PRO B NATRIURETIC PEPTIDE: Pro B Natriuretic peptide (BNP): 220.9 pg/mL — ABNORMAL HIGH (ref 0–125)

## 2011-06-16 MED ORDER — TECHNETIUM TC 99M TETROFOSMIN IV KIT
30.0000 | PACK | Freq: Once | INTRAVENOUS | Status: AC | PRN
  Administered 2011-06-16: 30 via INTRAVENOUS

## 2011-06-16 MED ORDER — TECHNETIUM TC 99M TETROFOSMIN IV KIT
10.0000 | PACK | Freq: Once | INTRAVENOUS | Status: AC | PRN
  Administered 2011-06-16: 10 via INTRAVENOUS

## 2011-07-02 NOTE — H&P (Signed)
NAMETABIA, LANDOWSKI NO.:  [id number]  MEDICAL RECORD NO.:  [id number]  LOCATION:  2925                         FACILITY:  [facility]  PHYSICIAN:  Harlon Flor, MD   DATE OF BIRTH:  [birth year]  DATE OF ADMISSION:  06/15/2011 DATE OF DISCHARGE:                             HISTORY & PHYSICAL   PRIMARY CARDIOLOGIST:  Rollene Rotunda, MD, [facility]  PRIMARY CARE PHYSICIAN:  Ernestina Penna, MD  CHIEF COMPLAINT:  Chest pain.  HISTORY OF PRESENT ILLNESS:  Ms. Mink is a pleasant 65 year old white female with coronary artery disease and prior bypass surgery in 2008 who presents this evening with chest pain.  She states she noted her pain at approximately 11:30 this morning.  Her pain is waxing and waning throughout the day and she has also noted that her blood pressure was low when her chest is hurting.  She states that her pain radiates to her left shoulder.  She does note that it is somewhat worse with exertionand has been improved with nitroglycerin.  Currently, it has almost gone.  She also notes being fatigued in the last few weeks to months and has wondered if this has to do with the diet she is on due to her fructose intolerance.  These symptoms were somewhat similar to the symptoms she had prior to her last PCI, but the chest pain is of somewhat different quality.  She is currently feeling better after receiving nitroglycerin in the emergency room.  PAST MEDICAL HISTORY: 1. Coronary artery disease:  She had bypass surgery in 2008 with a     LIMA to LAD, a vein graft to her OM, her diagonal, and her RCA.  It     is known that the vein graft to her obtuse marginal and RCA were     occluded.  In October 2010, she underwent 2.25 x 23 Cypher PCI to     her native OM and in November 2010 she underwent 2.25 x 23 Cypher     stenting to the vein graft to the diagonal.  She had a repeat heart     catheterization in May 2011, which showed patent stents,     obstructive  negative disease, and patent LIMA and vein graft to     diagonal. 2. Hyperlipidemia. 3. Hypertension. 4. Fructose intolerance.  MEDICATIONS: 1. Aspirin 325 daily. 2. Toprol-XL 75 mg daily. 3. Losartan 25 mg daily. 4. Zetia 10 mg daily. 5. Crestor 5 mg Monday through Friday only. 6. Plavix 75 mg daily. 7. Protonix 40 mg daily. 8. Fish oil 1400 mg 2 tablets daily. 9. Flaxseed oil 1300 mg daily. 10.Calcium plus vitamin D 1 tablet daily. 11.Super B-Complex daily. 12.D3 2000 international units daily. 13.Benadryl 25 mg as needed for sleep.  ALLERGIES:  MORPHINE and PREDNISONE.  SOCIAL HISTORY:  The patient lives with her husband.  She has never smoked tobacco.  She uses only occasional alcohol.  She does walk 1-2 times per  cm per week with her husband as well as go swimming once per week.  FAMILY HISTORY:  Her mother had hypertension.  She also has a sibling with hypertension.  There is no early  coronary artery disease.  REVIEW OF SYSTEMS:  A full review of systems is obtained and is negative except as stated in the HPI.  PHYSICAL EXAMINATION:  VITAL SIGNS:  Blood pressure 154/60, pulse 63, temperature 97.9, and respiratory rate 18. GENERAL:  No acute distress. HEENT:  Extraocular movements are intact.  Oropharynx is benign. Nonicteric sclera. NECK:  Supple.  No carotid bruits. CARDIOVASCULAR:  Regular rate and rhythm with normal S1 and S2.  No gallop or murmur. LUNGS:  Clear to auscultation bilaterally. ABDOMEN:  Soft, nontender, and nondistended. EXTREMITIES:  No clubbing, cyanosis, or edema. PULSES:  2+ dorsalis pedis and posterior tibial bilaterally. MUSCULOSKELETAL:  No joint deformity.  No CVA tenderness. NEURO:  Alert and oriented x3.  Moves all extremities well.  No gross deficits. LYMPH:  No lymphadenopathy. SKIN:  No rashes.  Chest x-ray is reviewed and it is clear.  EKG shows normal sinus rhythm with a rate of 60 and no ST-T changes.  LABORATORY DATA:   White count 5.6, hemoglobin 13.6, and platelets 207. Sodium 142, potassium 4.4, BUN 12, and creatinine 0.76.  First troponin is undetectable.  ASSESSMENT/PLAN:  Ms.  Berrios is a pleasant 65 year old white female with coronary artery disease and prior bypass surgery as well as percutaneous coronary intervention to her native the obtuse marginal and percutaneous coronary intervention to the vein graft to her diagonal who is here with unstable angina. 1. Unstable angina:  She is currently on aspirin and Plavix, which we     will continue.  We will start a heparin drip in the emergency room.     We will continue her Toprol and other medications and likely plan     to titrate her antianginal therapy.  We will consider adding Imdur.     We will likely consider left heart cath in the morning unless her     cardiac enzymes remain negative in which case functional testing     could be considered. 2. Hyperlipidemia:  She has not tolerated many statins.  We will     continue her Crestor at the dose that she tolerates 5 mg Monday     through Friday. 3. Fluids:  We will give her normal saline at 75 mL an hour while she     is n.p.o. tonight.     Harlon Flor, MD     MMB/MEDQ  D:  06/15/2011  T:  06/16/2011  Job:  [id number]  cc:   Ernestina Penna, M.D.  Electronically Signed by Meridee Score MD on 07/02/2011 08:10:26 PM

## 2011-07-06 ENCOUNTER — Encounter: Payer: Self-pay | Admitting: Cardiology

## 2011-07-07 ENCOUNTER — Encounter: Payer: Self-pay | Admitting: Cardiology

## 2011-07-07 ENCOUNTER — Ambulatory Visit (INDEPENDENT_AMBULATORY_CARE_PROVIDER_SITE_OTHER): Payer: Medicare Other | Admitting: Cardiology

## 2011-07-07 DIAGNOSIS — I10 Essential (primary) hypertension: Secondary | ICD-10-CM

## 2011-07-07 DIAGNOSIS — E785 Hyperlipidemia, unspecified: Secondary | ICD-10-CM

## 2011-07-07 DIAGNOSIS — R079 Chest pain, unspecified: Secondary | ICD-10-CM

## 2011-07-07 NOTE — Progress Notes (Signed)
HPI The patient presents for post hospital followup. She was admitted with chest pain and ruled out.  She had a negative stress test.  Since that time she has had no further chest pain.  She is going to go back to swimming. The patient denies any new symptoms such as chest discomfort, neck or arm discomfort. There has been no new shortness of breath, PND or orthopnea. There have been no reported palpitations, presyncope or syncope.  She is have some slight memory issues.  Allergies  Allergen Reactions  . Morphine And Related   . Prednisone   . Sulfamethoxazole W/Trimethoprim   . Sulfonamide Derivatives     Current Outpatient Prescriptions  Medication Sig Dispense Refill  . Alpha-D-Galactosidase (BEANO PO) Take by mouth daily.        Marland Kitchen aspirin 81 MG tablet Take 81 mg by mouth daily.        . B Complex-C (SUPER B COMPLEX PO) Take by mouth daily.        . calcium carbonate (TUMS) 500 MG chewable tablet Chew 1 tablet by mouth daily.        . Cholecalciferol (VITAMIN D-3 PO) Take by mouth daily.        . diphenhydramine-acetaminophen (TYLENOL PM) 25-500 MG TABS Take 1 tablet by mouth at bedtime as needed.        . fish oil-omega-3 fatty acids 1000 MG capsule Take 2 g by mouth daily.        Marland Kitchen FLAXSEED, LINSEED, PO Take by mouth daily.        Marland Kitchen losartan (COZAAR) 25 MG tablet Take 25 mg by mouth daily.        . metoprolol succinate (TOPROL-XL) 25 MG 24 hr tablet Take 25 mg by mouth. 3 po daily       . nitroGLYCERIN (NITROSTAT) 0.4 MG SL tablet Place 0.4 mg under the tongue every 5 (five) minutes as needed.        . Nutritional Supplements (CHOICE DM FIBER-BURST PO) Take by mouth daily.        . pantoprazole (PROTONIX) 40 MG tablet Take 40 mg by mouth daily.        Marland Kitchen PLAVIX 75 MG tablet TAKE ONE TABLET BY MOUTH EVERY DAY  30 each  6  . polyethylene glycol (MIRALAX / GLYCOLAX) packet Take 17 g by mouth daily.        . rosuvastatin (CRESTOR) 5 MG tablet Take 5 mg by mouth daily.        Marland Kitchen ZETIA 10  MG tablet TAKE ONE TABLET BY MOUTH EVERY DAY  30 each  6    Past Medical History  Diagnosis Date  . DYSLIPIDEMIA   . Anxiety state, unspecified   . Essential hypertension, benign   . CORONARY ATHEROSCLEROSIS NATIVE CORONARY ARTERY   . CHEST PAIN UNSPECIFIED   . COLONIC POLYPS, HX OF     Past Surgical History  Procedure Date  . Coronary artery bypass graft   . Nasal sinus surgery   . Polypectomy     ROS:  As stated in the HPI and negative for all other systems.  PHYSICAL EXAM BP 133/62  Pulse 59  Resp 16  Ht 5\' 5"  (1.651 m)  Wt 140 lb (63.504 kg)  BMI 23.30 kg/m2 GENERAL:  Well appearing HEENT:  Pupils equal round and reactive, fundi not visualized, oral mucosa unremarkable NECK:  No jugular venous distention, waveform within normal limits, carotid upstroke brisk and symmetric, no bruits, no  thyromegaly LYMPHATICS:  No cervical, inguinal adenopathy LUNGS:  Clear to auscultation bilaterally BACK:  No CVA tenderness CHEST:  Well healed sternotomy scar. HEART:  PMI not displaced or sustained,S1 and S2 within normal limits, no S3, no S4, no clicks, no rubs, no murmurs ABD:  Flat, positive bowel sounds normal in frequency in pitch, no bruits, no rebound, no guarding, no midline pulsatile mass, no hepatomegaly, no splenomegaly EXT:  2 plus pulses throughout, no edema, no cyanosis no clubbing SKIN:  No rashes no nodules NEURO:  Cranial nerves II through XII grossly intact, motor grossly intact throughout PSYCH:  Cognitively intact, oriented to person place and time   EKG:  Sinus rhythm, rate 64, axis within normal limits, intervals within normal limits, no acute ST-T wave changes.   ASSESSMENT AND PLAN

## 2011-07-07 NOTE — Assessment & Plan Note (Signed)
At this point she is having no pain.   No change in therapy or further study is indicated.  She will continue with meds as listed

## 2011-07-07 NOTE — Assessment & Plan Note (Signed)
The blood pressure is at target. No change in medications is indicated. We will continue with therapeutic lifestyle changes (TLC).  

## 2011-07-07 NOTE — Patient Instructions (Signed)
Follow up in 6 months Hochrein.  You will receive a letter in the mail 2 months before you are due.  Please call us when you receive this letter to schedule your follow up appointment.  Continue current medications

## 2011-07-07 NOTE — Assessment & Plan Note (Signed)
She will continue the current meds as listed.  I will repeat a lipid panel when I see her back.

## 2011-07-17 ENCOUNTER — Other Ambulatory Visit (HOSPITAL_COMMUNITY): Payer: Self-pay | Admitting: Urology

## 2011-07-17 DIAGNOSIS — D49519 Neoplasm of unspecified behavior of unspecified kidney: Secondary | ICD-10-CM

## 2011-07-20 ENCOUNTER — Other Ambulatory Visit: Payer: Self-pay | Admitting: Cardiology

## 2011-07-30 LAB — BASIC METABOLIC PANEL
BUN: 12
BUN: 12
BUN: 9
CO2: 27
CO2: 28
CO2: 31
CO2: 32
Calcium: 7.6 — ABNORMAL LOW
Calcium: 8.5
Calcium: 8.9
Chloride: 100
Chloride: 102
Chloride: 106
Chloride: 112
Creatinine, Ser: 0.77
Creatinine, Ser: 0.79
Creatinine, Ser: 0.8
Creatinine, Ser: 0.9
GFR calc Af Amer: >60
GFR calc Af Amer: >60
GFR calc Af Amer: >60
GFR calc Af Amer: >60
GFR calc non Af Amer: >60
GFR calc non Af Amer: >60
GFR calc non Af Amer: >60
Glucose, Bld: 105 — ABNORMAL HIGH
Glucose, Bld: 106 — ABNORMAL HIGH
Glucose, Bld: 124 — ABNORMAL HIGH
Potassium: 4
Potassium: 4
Potassium: 4.3
Potassium: 4.8
Sodium: 140
Sodium: 141
Sodium: 142
Sodium: 144

## 2011-07-30 LAB — CROSSMATCH
ABO/RH(D): O POS
Antibody Screen: NEGATIVE

## 2011-07-30 LAB — COMPREHENSIVE METABOLIC PANEL
ALT: 19
ALT: 24
AST: 23
Albumin: 3.8
Albumin: 4.3
Alkaline Phosphatase: 63
CO2: 27
Calcium: 8.8
Calcium: 9.2
Chloride: 104
GFR calc Af Amer: >60
GFR calc non Af Amer: >60
Glucose, Bld: 110 — ABNORMAL HIGH
Potassium: 4.2
Sodium: 138
Sodium: 139
Total Protein: 6.9

## 2011-07-30 LAB — URINALYSIS, ROUTINE W REFLEX MICROSCOPIC
Bilirubin Urine: NEGATIVE
Ketones, ur: NEGATIVE
Leukocytes, UA: NEGATIVE
Nitrite: NEGATIVE
Protein, ur: NEGATIVE
Urobilinogen, UA: 0.2
pH: 6.5

## 2011-07-30 LAB — POCT I-STAT 3, ART BLOOD GAS (G3+)
Bicarbonate: 24.7 — ABNORMAL HIGH
Bicarbonate: 24.9 — ABNORMAL HIGH
O2 Saturation: 100
O2 Saturation: 97
Operator id: 285121
Operator id: 285121
Operator id: 3406
TCO2: 26
pCO2 arterial: 30.9 — ABNORMAL LOW
pCO2 arterial: 35.8
pCO2 arterial: 40.7
pCO2 arterial: 44.5
pH, Arterial: 7.345 — ABNORMAL LOW
pO2, Arterial: 296 — ABNORMAL HIGH
pO2, Arterial: 358 — ABNORMAL HIGH
pO2, Arterial: 75 — ABNORMAL LOW

## 2011-07-30 LAB — CREATININE, SERUM
Creatinine, Ser: 0.77
Creatinine, Ser: 0.82
GFR calc Af Amer: >60
GFR calc Af Amer: >60
GFR calc non Af Amer: >60
GFR calc non Af Amer: >60

## 2011-07-30 LAB — CBC
HCT: 30.1 — ABNORMAL LOW
HCT: 30.1 — ABNORMAL LOW
HCT: 32.9 — ABNORMAL LOW
HCT: 34.7 — ABNORMAL LOW
HCT: 35.4 — ABNORMAL LOW
HCT: 37.4
Hemoglobin: 10.2 — ABNORMAL LOW
Hemoglobin: 10.4 — ABNORMAL LOW
Hemoglobin: 11.2 — ABNORMAL LOW
Hemoglobin: 11.9 — ABNORMAL LOW
Hemoglobin: 12
Hemoglobin: 12.7
Hemoglobin: 14.5
MCHC: 33.9
MCHC: 33.9
MCHC: 33.9
MCHC: 34.1
MCHC: 34.3
MCHC: 34.5
MCHC: 34.9
MCV: 89
MCV: 89.9
MCV: 90.6
MCV: 91.1
MCV: 91.6
MCV: 91.7
MCV: 92.6
Platelets: 122 — ABNORMAL LOW
Platelets: 130 — ABNORMAL LOW
Platelets: 134 — ABNORMAL LOW
Platelets: 134 — ABNORMAL LOW
Platelets: 141 — ABNORMAL LOW
Platelets: 204
Platelets: 217
Platelets: 226
RBC: 3.3 — ABNORMAL LOW
RBC: 3.39 — ABNORMAL LOW
RBC: 3.66 — ABNORMAL LOW
RBC: 3.83 — ABNORMAL LOW
RBC: 3.87
RBC: 4.08
RBC: 4.09
RBC: 4.25
RBC: 4.55
RDW: 13.4
RDW: 13.5
RDW: 13.5
RDW: 13.6
RDW: 13.8
RDW: 13.9
WBC: 11.9 — ABNORMAL HIGH
WBC: 12 — ABNORMAL HIGH
WBC: 5.5
WBC: 7
WBC: 7.5
WBC: 8
WBC: 8.3
WBC: 8.7
WBC: 8.7

## 2011-07-30 LAB — HEMOGLOBIN AND HEMATOCRIT, BLOOD
HCT: 25.8 — ABNORMAL LOW
Hemoglobin: 9 — ABNORMAL LOW

## 2011-07-30 LAB — BLOOD GAS, ARTERIAL
Drawn by: 277551
Patient temperature: 98.6
TCO2: 23.1
pCO2 arterial: 36.8
pH, Arterial: 7.393

## 2011-07-30 LAB — CARDIAC PANEL(CRET KIN+CKTOT+MB+TROPI)
CK, MB: 2.1
Relative Index: INVALID
Troponin I: 0.05

## 2011-07-30 LAB — I-STAT 8, (EC8 V) (CONVERTED LAB)
BUN: 10
Chloride: 105
Glucose, Bld: 104 — ABNORMAL HIGH
Potassium: 5
pCO2, Ven: 44.1 — ABNORMAL LOW
pH, Ven: 7.361 — ABNORMAL HIGH

## 2011-07-30 LAB — I-STAT EC8
Acid-base deficit: 3 — ABNORMAL HIGH
BUN: 9
Bicarbonate: 23.9
Glucose, Bld: 102 — ABNORMAL HIGH
HCT: 31 — ABNORMAL LOW
Operator id: 285121
Potassium: 3.1 — ABNORMAL LOW
TCO2: 22
pCO2 arterial: 49.6 — ABNORMAL HIGH
pH, Arterial: 7.41 — ABNORMAL HIGH

## 2011-07-30 LAB — PLATELET COUNT: Platelets: 116 — ABNORMAL LOW

## 2011-07-30 LAB — CK TOTAL AND CKMB (NOT AT ARMC)
CK, MB: 3
Relative Index: INVALID
Relative Index: INVALID
Total CK: 48
Total CK: 94

## 2011-07-30 LAB — PREPARE FRESH FROZEN PLASMA

## 2011-07-30 LAB — POCT I-STAT 4, (NA,K, GLUC, HGB,HCT)
Glucose, Bld: 97
HCT: 32 — ABNORMAL LOW
Hemoglobin: 10.9 — ABNORMAL LOW
Hemoglobin: 6.8 — CL
Hemoglobin: 8.2 — ABNORMAL LOW
Operator id: 3406
Operator id: 3406
Operator id: 3406
Operator id: 3406
Potassium: 3.9
Sodium: 137
Sodium: 138

## 2011-07-30 LAB — PROTIME-INR
INR: 0.9
INR: 1.3
Prothrombin Time: 11.9
Prothrombin Time: 14.5
Prothrombin Time: 16.7 — ABNORMAL HIGH

## 2011-07-30 LAB — HEPARIN LEVEL (UNFRACTIONATED)
Heparin Unfractionated: 0.73 — ABNORMAL HIGH
Heparin Unfractionated: 0.78 — ABNORMAL HIGH
Heparin Unfractionated: 0.98 — ABNORMAL HIGH
Heparin Unfractionated: <0.1 — ABNORMAL LOW

## 2011-07-30 LAB — LIPID PANEL: Cholesterol: 161

## 2011-07-30 LAB — TSH: TSH: 1.785

## 2011-07-30 LAB — DIFFERENTIAL
Lymphs Abs: 1.5
Monocytes Absolute: 0.4
Monocytes Relative: 6
Neutro Abs: 5
Neutrophils Relative %: 72

## 2011-07-30 LAB — MAGNESIUM
Magnesium: 2.3
Magnesium: 2.7 — ABNORMAL HIGH
Magnesium: 3 — ABNORMAL HIGH

## 2011-07-30 LAB — PREPARE PLATELET PHERESIS

## 2011-07-30 LAB — TROPONIN I: Troponin I: 0.03

## 2011-07-30 LAB — ABO/RH: ABO/RH(D): O POS

## 2011-07-30 LAB — APTT
aPTT: 100 — ABNORMAL HIGH
aPTT: 35

## 2011-07-30 LAB — POCT I-STAT GLUCOSE: Glucose, Bld: 128 — ABNORMAL HIGH

## 2011-07-30 LAB — B-NATRIURETIC PEPTIDE (CONVERTED LAB): Pro B Natriuretic peptide (BNP): 56

## 2011-07-30 NOTE — Discharge Summary (Signed)
Nancy [name], DAFOE NO.:  [id number]  MEDICAL RECORD NO.:  [id number]  LOCATION:  3738                         FACILITY:  [facility]  PHYSICIAN:  Rollene Rotunda, MD, FACCDATE OF BIRTH:  Feb 19, 1946  DATE OF ADMISSION:  06/15/2011 DATE OF DISCHARGE:  06/16/2011                              DISCHARGE SUMMARY   PRIMARY CARDIOLOGIST:  Rollene Rotunda, MD, [facility]  PRIMARY CARE PROVIDER:  Ernestina Penna, MD  DISCHARGE DIAGNOSIS:  Chest pain without objective evidence of ischemia.  SECONDARY DIAGNOSES: 1. Coronary artery disease status post prior coronary artery bypass     grafting and multiple coronary interventions involving the native     obtuse marginal and vein graft to first diagonal. 2. Hyperlipidemia. 3. Hypertension. 4. Fructose intolerance. 5. Gastroesophageal reflux disease. 6. Insomnia. 7. Chronic back pain.  ALLERGIES:  SULFA, FRUCTOSE, BACITRACIN, PREDNISONE, MORPHINE.  PROCEDURES:  Lexiscan Myoview showing no evidence of ischemia or infarction.  HISTORY OF PRESENT ILLNESS:  A 65 year old female with the above problem list who was in usual state of health until approximately 11:30 in the morning on June 15, 2011, when she developed left-sided chest discomfort with a radiation to her shoulder that was worse with exertion and better with nitrates.  She presented to the [facility] ED where she had negative cardiac markers and a nonacute ECG, and was admitted for evaluation.  HOSPITAL COURSE:  The patient ruled out for MI.  She had no additional chest discomfort.  She was initially set up for an exercise Myoview however, the patient had received a beta-blocker in the morning, therefore this was switched to a [employer].  She tolerated this well and imaging has shown no evidence of ischemia or infarct.  She will be discharged home today in good condition.  DISCHARGE LABS:  Hemoglobin 13.1, hematocrit 38.0, WBC is 5.7, platelets 183.  Sodium 144,  potassium 3.6, chloride 111, CO2 of 25, BUN 12, creatinine 0.66, glucose 86, total bilirubin 0.2, alkaline phosphatase 68, AST 20, ALT 29, total protein 7.1, albumin 42, calcium 9.1, CK 89, MB 2.9, troponin-I less than 0.30.  BNP 220.9, total cholesterol 153, triglycerides 116, HDL 46, LDL 84.  MRSA screen was negative.  DISPOSITION:  The patient is being discharged home today in good condition.  FOLLOWUP APPOINTMENTS:  She will followup with Dr. Antoine Poche on July 07, 2011, at 10:45 a.m. and with Dr. Christell Constant as previously scheduled.  DISCHARGE MEDICATIONS: 1. Aspirin 81 mg daily. 2. Beano 2 tabs t.i.d. 3. Benadryl 25 mg 1-2 tabs at bedtime. 4. Calcium carbonate plus D 1 tab q.Monday, Wednesday, and Friday. 5. Clopidogrel 75 mg daily. 6. Fish oil 2 tabs daily. 7. Flaxseed oil 1 tab daily. 8. Losartan 25 mg daily. 9. Metoprolol XL 25 mg daily. 10.MiraLax half-dose daily. 11.Nitroglycerin 0.4 mg sublingual p.r.n. chest pain. 12.Protonix 40 mg daily. 13.Rosuvastatin 5 mg daily. 14.Super B complex Tuesday, Thursday, Sunday. 15.Vitamin D3 daily. 16.Zetia 10 mg daily.  OUTSTANDING LAB STUDIES:  None.  DURATION OF DISCHARGE ENCOUNTER:  Forty minutes including physician time.     Nicolasa Ducking, ANP   ______________________________ Rollene Rotunda, MD, [facility]    CB/MEDQ  D:  06/16/2011  T:  06/17/2011  Job:  [id number]  cc:   Ernestina Penna, M.D.  Electronically Signed by Nicolasa Ducking ANP on 07/02/2011 03:17:26 PM Electronically Signed by Rollene Rotunda MD [facility] on 07/30/2011 01:33:08 PM

## 2011-08-29 ENCOUNTER — Ambulatory Visit (HOSPITAL_COMMUNITY)
Admission: RE | Admit: 2011-08-29 | Discharge: 2011-08-29 | Disposition: A | Discharge status: Home / Self Care | Payer: Medicare Other | Source: Ambulatory Visit | Attending: Urology | Admitting: Urology

## 2011-08-29 DIAGNOSIS — D49519 Neoplasm of unspecified behavior of unspecified kidney: Secondary | ICD-10-CM

## 2011-08-29 DIAGNOSIS — N289 Disorder of kidney and ureter, unspecified: Secondary | ICD-10-CM | POA: Insufficient documentation

## 2011-08-29 LAB — CREATININE, SERUM: GFR calc non Af Amer: 67 mL/min — ABNORMAL LOW (ref >90)

## 2011-08-29 MED ORDER — GADOBENATE DIMEGLUMINE 529 MG/ML IV SOLN
12.0000 mL | Freq: Once | INTRAVENOUS | Status: AC | PRN
  Administered 2011-08-29: 12 mL via INTRAVENOUS

## 2011-09-29 ENCOUNTER — Ambulatory Visit: Payer: Medicare Other | Admitting: Cardiology

## 2011-10-07 ENCOUNTER — Other Ambulatory Visit: Payer: Self-pay | Admitting: Cardiovascular Disease

## 2011-11-09 ENCOUNTER — Other Ambulatory Visit: Payer: Self-pay | Admitting: Cardiology

## 2011-11-26 ENCOUNTER — Encounter: Payer: Self-pay | Admitting: Cardiology

## 2011-11-26 DIAGNOSIS — I10 Essential (primary) hypertension: Secondary | ICD-10-CM | POA: Diagnosis not present

## 2011-11-26 DIAGNOSIS — E785 Hyperlipidemia, unspecified: Secondary | ICD-10-CM | POA: Diagnosis not present

## 2011-12-02 DIAGNOSIS — E785 Hyperlipidemia, unspecified: Secondary | ICD-10-CM | POA: Diagnosis not present

## 2011-12-02 DIAGNOSIS — E875 Hyperkalemia: Secondary | ICD-10-CM | POA: Diagnosis not present

## 2011-12-02 DIAGNOSIS — I2510 Atherosclerotic heart disease of native coronary artery without angina pectoris: Secondary | ICD-10-CM | POA: Diagnosis not present

## 2011-12-11 ENCOUNTER — Other Ambulatory Visit: Payer: Self-pay | Admitting: Cardiology

## 2012-01-04 ENCOUNTER — Ambulatory Visit: Payer: Medicare Other | Admitting: Cardiology

## 2012-01-18 ENCOUNTER — Other Ambulatory Visit: Payer: Self-pay | Admitting: Family Medicine

## 2012-01-18 DIAGNOSIS — Z1231 Encounter for screening mammogram for malignant neoplasm of breast: Secondary | ICD-10-CM

## 2012-01-28 ENCOUNTER — Ambulatory Visit (INDEPENDENT_AMBULATORY_CARE_PROVIDER_SITE_OTHER): Payer: Medicare Other | Admitting: Cardiology

## 2012-01-28 ENCOUNTER — Encounter: Payer: Self-pay | Admitting: Cardiology

## 2012-01-28 VITALS — BP 125/65 | HR 59 | Ht 65.0 in | Wt 133.4 lb

## 2012-01-28 DIAGNOSIS — I10 Essential (primary) hypertension: Secondary | ICD-10-CM | POA: Diagnosis not present

## 2012-01-28 DIAGNOSIS — I2510 Atherosclerotic heart disease of native coronary artery without angina pectoris: Secondary | ICD-10-CM | POA: Diagnosis not present

## 2012-01-28 DIAGNOSIS — E785 Hyperlipidemia, unspecified: Secondary | ICD-10-CM

## 2012-01-28 NOTE — Patient Instructions (Signed)
The current medical regimen is effective;  continue present plan and medications.  Follow up in 6 months with Dr Hochrein.  You will receive a letter in the mail 2 months before you are due.  Please call us when you receive this letter to schedule your follow up appointment.  

## 2012-01-28 NOTE — Assessment & Plan Note (Signed)
The patient has no new sypmtoms.  No further cardiovascular testing is indicated.  We will continue with aggressive risk reduction and meds as listed.  

## 2012-01-28 NOTE — Assessment & Plan Note (Signed)
The blood pressure is at target. No change in medications is indicated. We will continue with therapeutic lifestyle changes (TLC).  

## 2012-01-28 NOTE — Assessment & Plan Note (Signed)
This was checked by her primary provider by her report.  I will defer to Rudi Heap, MD

## 2012-01-28 NOTE — Progress Notes (Signed)
HPI The patient presents for follow up CAD.  Since I last saw her she has done well.  The patient denies any new symptoms such as chest discomfort, neck or arm discomfort. There has been no new shortness of breath, PND or orthopnea. There have been no reported palpitations, presyncope or syncope. She is exercising and has lost weight with diet and exercise.  Allergies  Allergen Reactions  . Morphine And Related   . Prednisone   . Sulfamethoxazole W/Trimethoprim   . Sulfonamide Derivatives     Current Outpatient Prescriptions  Medication Sig Dispense Refill  . Alpha-D-Galactosidase (BEANO PO) Take by mouth daily. Take 2-3 pills before each meal a day      . aspirin 81 MG tablet Take 81 mg by mouth daily.        . B Complex-C (SUPER B COMPLEX PO) Take by mouth daily.        . Cholecalciferol (VITAMIN D-3 PO) Take by mouth daily.        . diphenhydramine-acetaminophen (TYLENOL PM) 25-500 MG TABS Take 1 tablet by mouth at bedtime as needed.        . fexofenadine (ALLEGRA) 180 MG tablet Take 180 mg by mouth daily.      . fish oil-omega-3 fatty acids 1000 MG capsule Take 2 g by mouth daily.        Marland Kitchen FLAXSEED, LINSEED, PO Take by mouth daily.        Marland Kitchen losartan (COZAAR) 25 MG tablet TAKE ONE TABLET BY MOUTH EVERY DAY  30 tablet  6  . metoprolol succinate (TOPROL-XL) 25 MG 24 hr tablet Take 25 mg by mouth. 3 po daily       . NITROSTAT 0.4 MG SL tablet DISSOLVE ONE TABLET AS NEEDED FOR CHEST PAIN AS DIRECTED  25 each  12  . pantoprazole (PROTONIX) 40 MG tablet Take 40 mg by mouth daily.        Marland Kitchen PLAVIX 75 MG tablet TAKE ONE TABLET BY MOUTH EVERY DAY  30 each  6  . polyethylene glycol (MIRALAX / GLYCOLAX) packet Take 17 g by mouth daily.        . ranitidine (ZANTAC) 75 MG tablet Take 75 mg by mouth 2 (two) times daily.      . rosuvastatin (CRESTOR) 5 MG tablet Take 1 tablet (5 mg total) by mouth daily.  30 each  5  . ZETIA 10 MG tablet TAKE ONE TABLET BY MOUTH EVERY DAY  30 each  5    Past  Medical History  Diagnosis Date  . DYSLIPIDEMIA   . Anxiety state, unspecified   . Essential hypertension, benign   . CORONARY ATHEROSCLEROSIS NATIVE CORONARY ARTERY   . CHEST PAIN UNSPECIFIED   . COLONIC POLYPS, HX OF     Past Surgical History  Procedure Date  . Coronary artery bypass graft   . Nasal sinus surgery   . Polypectomy     ROS:  As stated in the HPI and negative for all other systems.  PHYSICAL EXAM BP 125/65  Pulse 59  Ht 5\' 5"  (1.651 m)  Wt 133 lb 6.4 oz (60.51 kg)  BMI 22.20 kg/m2 GENERAL:  Well appearing HEENT:  Pupils equal round and reactive, fundi not visualized, oral mucosa unremarkable NECK:  No jugular venous distention, waveform within normal limits, carotid upstroke brisk and symmetric, no bruits, no thyromegaly LYMPHATICS:  No cervical, inguinal adenopathy LUNGS:  Clear to auscultation bilaterally BACK:  No CVA tenderness CHEST:  Well healed sternotomy scar. HEART:  PMI not displaced or sustained,S1 and S2 within normal limits, no S3, no S4, no clicks, no rubs, no murmurs ABD:  Flat, positive bowel sounds normal in frequency in pitch, no bruits, no rebound, no guarding, no midline pulsatile mass, no hepatomegaly, no splenomegaly EXT:  2 plus pulses throughout, no edema, no cyanosis no clubbing SKIN:  No rashes no nodules NEURO:  Cranial nerves II through XII grossly intact, motor grossly intact throughout PSYCH:  Cognitively intact, oriented to person place and time   EKG:  Sinus rhythm, rate 59, axis within normal limits, intervals within normal limits, no acute ST-T wave changes.  01/28/2012   ASSESSMENT AND PLAN

## 2012-02-17 ENCOUNTER — Ambulatory Visit
Admission: RE | Admit: 2012-02-17 | Discharge: 2012-02-17 | Disposition: A | Discharge status: Home / Self Care | Payer: Medicare Other | Source: Ambulatory Visit | Attending: Family Medicine | Admitting: Family Medicine

## 2012-02-17 DIAGNOSIS — Z1231 Encounter for screening mammogram for malignant neoplasm of breast: Secondary | ICD-10-CM | POA: Diagnosis not present

## 2012-03-31 DIAGNOSIS — E785 Hyperlipidemia, unspecified: Secondary | ICD-10-CM | POA: Diagnosis not present

## 2012-03-31 DIAGNOSIS — I2510 Atherosclerotic heart disease of native coronary artery without angina pectoris: Secondary | ICD-10-CM | POA: Diagnosis not present

## 2012-04-27 ENCOUNTER — Ambulatory Visit
Admission: RE | Admit: 2012-04-27 | Discharge: 2012-04-27 | Disposition: A | Discharge status: Home / Self Care | Payer: Medicare Other | Source: Ambulatory Visit | Attending: Family Medicine | Admitting: Family Medicine

## 2012-04-27 ENCOUNTER — Encounter (HOSPITAL_COMMUNITY): Payer: Self-pay | Admitting: *Deleted

## 2012-04-27 ENCOUNTER — Other Ambulatory Visit: Payer: Self-pay | Admitting: Family Medicine

## 2012-04-27 ENCOUNTER — Inpatient Hospital Stay: Admit: 2012-04-27 | Payer: Self-pay | Admitting: Neurological Surgery

## 2012-04-27 ENCOUNTER — Encounter (HOSPITAL_COMMUNITY)
Admission: EM | Disposition: A | Discharge status: Home / Self Care | Payer: Self-pay | Source: Home / Self Care | Attending: Neurological Surgery

## 2012-04-27 ENCOUNTER — Inpatient Hospital Stay (HOSPITAL_COMMUNITY): Payer: Medicare Other | Admitting: *Deleted

## 2012-04-27 ENCOUNTER — Inpatient Hospital Stay (HOSPITAL_COMMUNITY): Payer: Medicare Other

## 2012-04-27 ENCOUNTER — Inpatient Hospital Stay (HOSPITAL_COMMUNITY)
Admission: EM | Admit: 2012-04-27 | Discharge: 2012-05-02 | DRG: 027 | Disposition: A | Discharge status: Home / Self Care | Payer: Medicare Other | Attending: Neurological Surgery | Admitting: Neurological Surgery

## 2012-04-27 DIAGNOSIS — R51 Headache: Secondary | ICD-10-CM | POA: Diagnosis not present

## 2012-04-27 DIAGNOSIS — I2510 Atherosclerotic heart disease of native coronary artery without angina pectoris: Secondary | ICD-10-CM | POA: Diagnosis present

## 2012-04-27 DIAGNOSIS — S065X9A Traumatic subdural hemorrhage with loss of consciousness of unspecified duration, initial encounter: Secondary | ICD-10-CM

## 2012-04-27 DIAGNOSIS — S065XAA Traumatic subdural hemorrhage with loss of consciousness status unknown, initial encounter: Secondary | ICD-10-CM

## 2012-04-27 DIAGNOSIS — Y998 Other external cause status: Secondary | ICD-10-CM

## 2012-04-27 DIAGNOSIS — X58XXXA Exposure to other specified factors, initial encounter: Secondary | ICD-10-CM | POA: Diagnosis present

## 2012-04-27 DIAGNOSIS — S065X0A Traumatic subdural hemorrhage without loss of consciousness, initial encounter: Principal | ICD-10-CM | POA: Diagnosis present

## 2012-04-27 DIAGNOSIS — J984 Other disorders of lung: Secondary | ICD-10-CM | POA: Diagnosis not present

## 2012-04-27 DIAGNOSIS — I10 Essential (primary) hypertension: Secondary | ICD-10-CM | POA: Diagnosis present

## 2012-04-27 DIAGNOSIS — Z951 Presence of aortocoronary bypass graft: Secondary | ICD-10-CM

## 2012-04-27 DIAGNOSIS — I6200 Nontraumatic subdural hemorrhage, unspecified: Secondary | ICD-10-CM | POA: Diagnosis not present

## 2012-04-27 DIAGNOSIS — E785 Hyperlipidemia, unspecified: Secondary | ICD-10-CM | POA: Diagnosis not present

## 2012-04-27 HISTORY — PX: CRANIOTOMY: SHX93

## 2012-04-27 HISTORY — DX: Traumatic subdural hemorrhage with loss of consciousness status unknown, initial encounter: S06.5XAA

## 2012-04-27 HISTORY — DX: Traumatic subdural hemorrhage with loss of consciousness of unspecified duration, initial encounter: S06.5X9A

## 2012-04-27 LAB — COMPREHENSIVE METABOLIC PANEL
Alkaline Phosphatase: 79 U/L (ref 39–117)
BUN: 9 mg/dL (ref 6–23)
CO2: 26 mEq/L (ref 19–32)
Chloride: 100 mEq/L (ref 96–112)
Creatinine, Ser: 0.82 mg/dL (ref 0.50–1.10)
GFR calc Af Amer: 85 mL/min — ABNORMAL LOW (ref >90)
GFR calc non Af Amer: 73 mL/min — ABNORMAL LOW (ref >90)
Glucose, Bld: 86 mg/dL (ref 70–99)
Potassium: 4.3 mEq/L (ref 3.5–5.1)
Total Bilirubin: 0.3 mg/dL (ref 0.3–1.2)

## 2012-04-27 LAB — CBC WITH DIFFERENTIAL/PLATELET
Basophils Relative: 1 % (ref 0–1)
HCT: 41.1 % (ref 36.0–46.0)
Hemoglobin: 14.3 g/dL (ref 12.0–15.0)
Lymphocytes Relative: 24 % (ref 12–46)
Lymphs Abs: 2.1 10*3/uL (ref 0.7–4.0)
MCHC: 34.8 g/dL (ref 30.0–36.0)
Monocytes Absolute: 0.6 10*3/uL (ref 0.1–1.0)
Monocytes Relative: 7 % (ref 3–12)
Neutro Abs: 5.7 10*3/uL (ref 1.7–7.7)
Neutrophils Relative %: 67 % (ref 43–77)
RBC: 4.53 MIL/uL (ref 3.87–5.11)

## 2012-04-27 LAB — PROTIME-INR
INR: 0.99 (ref 0.00–1.49)
Prothrombin Time: 13.3 seconds (ref 11.6–15.2)

## 2012-04-27 LAB — POCT I-STAT TROPONIN I

## 2012-04-27 SURGERY — CRANIOTOMY HEMATOMA EVACUATION SUBDURAL
Anesthesia: General | Site: Head | Laterality: Right | Wound class: Clean

## 2012-04-27 MED ORDER — SODIUM CHLORIDE 0.9 % IV SOLN
INTRAVENOUS | Status: DC | PRN
  Administered 2012-04-27: 16:00:00 via INTRAVENOUS

## 2012-04-27 MED ORDER — HYDROCODONE-ACETAMINOPHEN 5-325 MG PO TABS
1.0000 | ORAL_TABLET | ORAL | Status: DC | PRN
  Administered 2012-04-29: 1 via ORAL
  Administered 2012-04-30 – 2012-05-01 (×2): 2 via ORAL
  Filled 2012-04-27 (×2): qty 2
  Filled 2012-04-27: qty 1

## 2012-04-27 MED ORDER — FENTANYL CITRATE 0.05 MG/ML IJ SOLN
INTRAMUSCULAR | Status: DC | PRN
  Administered 2012-04-27 (×2): 100 ug via INTRAVENOUS

## 2012-04-27 MED ORDER — SODIUM CHLORIDE 0.9 % IR SOLN
Status: DC | PRN
  Administered 2012-04-27: 17:00:00

## 2012-04-27 MED ORDER — THROMBIN 20000 UNITS EX KIT
PACK | CUTANEOUS | Status: DC | PRN
  Administered 2012-04-27: 17:00:00 via TOPICAL

## 2012-04-27 MED ORDER — ACETAMINOPHEN 325 MG PO TABS: 650.0000 mg | ORAL_TABLET | ORAL | Status: DC | PRN

## 2012-04-27 MED ORDER — METOPROLOL SUCCINATE ER 50 MG PO TB24
75.0000 mg | ORAL_TABLET | Freq: Every day | ORAL | Status: DC
  Administered 2012-04-28 – 2012-05-01 (×4): 75 mg via ORAL
  Filled 2012-04-27 (×5): qty 1

## 2012-04-27 MED ORDER — EZETIMIBE 10 MG PO TABS
10.0000 mg | ORAL_TABLET | Freq: Every day | ORAL | Status: DC
  Administered 2012-04-28 – 2012-05-01 (×4): 10 mg via ORAL
  Filled 2012-04-27 (×5): qty 1

## 2012-04-27 MED ORDER — LABETALOL HCL 5 MG/ML IV SOLN
INTRAVENOUS | Status: DC | PRN
  Administered 2012-04-27 (×2): 5 mg via INTRAVENOUS

## 2012-04-27 MED ORDER — NITROGLYCERIN 0.4 MG SL SUBL: 0.4000 mg | SUBLINGUAL_TABLET | SUBLINGUAL | Status: DC | PRN

## 2012-04-27 MED ORDER — SUCCINYLCHOLINE CHLORIDE 20 MG/ML IJ SOLN
INTRAMUSCULAR | Status: DC | PRN
  Administered 2012-04-27: 100 mg via INTRAVENOUS

## 2012-04-27 MED ORDER — MENTHOL 3 MG MT LOZG: 1.0000 | LOZENGE | OROMUCOSAL | Status: DC | PRN

## 2012-04-27 MED ORDER — PROPOFOL 10 MG/ML IV EMUL
INTRAVENOUS | Status: DC | PRN
  Administered 2012-04-27: 150 mg via INTRAVENOUS
  Administered 2012-04-27 (×2): 50 mg via INTRAVENOUS
  Administered 2012-04-27: 20 mg via INTRAVENOUS
  Administered 2012-04-27: 40 mg via INTRAVENOUS

## 2012-04-27 MED ORDER — SODIUM CHLORIDE 0.9 % IJ SOLN
3.0000 mL | Freq: Two times a day (BID) | INTRAMUSCULAR | Status: DC
  Administered 2012-04-27 – 2012-05-01 (×6): 3 mL via INTRAVENOUS

## 2012-04-27 MED ORDER — LOSARTAN POTASSIUM 25 MG PO TABS
25.0000 mg | ORAL_TABLET | Freq: Every day | ORAL | Status: DC
  Administered 2012-04-28 – 2012-05-01 (×4): 25 mg via ORAL
  Filled 2012-04-27 (×5): qty 1

## 2012-04-27 MED ORDER — ESMOLOL HCL 10 MG/ML IV SOLN
INTRAVENOUS | Status: DC | PRN
  Administered 2012-04-27 (×4): 10 mg via INTRAVENOUS

## 2012-04-27 MED ORDER — VECURONIUM BROMIDE 10 MG IV SOLR
INTRAVENOUS | Status: DC | PRN
  Administered 2012-04-27: 4 mg via INTRAVENOUS

## 2012-04-27 MED ORDER — POTASSIUM CHLORIDE IN NACL 20-0.9 MEQ/L-% IV SOLN
INTRAVENOUS | Status: DC
  Administered 2012-04-27 – 2012-04-28 (×2): via INTRAVENOUS
  Filled 2012-04-27 (×5): qty 1000

## 2012-04-27 MED ORDER — EPHEDRINE SULFATE 50 MG/ML IJ SOLN
INTRAMUSCULAR | Status: DC | PRN
  Administered 2012-04-27 (×4): 5 mg via INTRAVENOUS

## 2012-04-27 MED ORDER — ONDANSETRON HCL 4 MG/2ML IJ SOLN
4.0000 mg | Freq: Four times a day (QID) | INTRAMUSCULAR | Status: AC | PRN
  Administered 2012-04-27: 4 mg via INTRAVENOUS

## 2012-04-27 MED ORDER — BACITRACIN ZINC 500 UNIT/GM EX OINT
TOPICAL_OINTMENT | CUTANEOUS | Status: DC | PRN
  Administered 2012-04-27: 1 via TOPICAL

## 2012-04-27 MED ORDER — MIDAZOLAM HCL 5 MG/5ML IJ SOLN
INTRAMUSCULAR | Status: DC | PRN
  Administered 2012-04-27: 1 mg via INTRAVENOUS

## 2012-04-27 MED ORDER — LIDOCAINE HCL (CARDIAC) 20 MG/ML IV SOLN
INTRAVENOUS | Status: DC | PRN
  Administered 2012-04-27: 80 mg via INTRAVENOUS

## 2012-04-27 MED ORDER — SODIUM CHLORIDE 0.9 % IJ SOLN: 3.0000 mL | INTRAMUSCULAR | Status: DC | PRN

## 2012-04-27 MED ORDER — CEFAZOLIN SODIUM 1-5 GM-% IV SOLN
1.0000 g | Freq: Three times a day (TID) | INTRAVENOUS | Status: AC
  Administered 2012-04-27: 1 g via INTRAVENOUS
  Filled 2012-04-27 (×2): qty 50

## 2012-04-27 MED ORDER — SODIUM CHLORIDE 0.9 % IV SOLN: 250.0000 mL | INTRAVENOUS | Status: DC

## 2012-04-27 MED ORDER — HYDROMORPHONE HCL PF 1 MG/ML IJ SOLN: 0.2500 mg | INTRAMUSCULAR | Status: DC | PRN

## 2012-04-27 MED ORDER — ONDANSETRON HCL 4 MG/2ML IJ SOLN: 4.0000 mg | Freq: Three times a day (TID) | INTRAMUSCULAR | Status: DC | PRN

## 2012-04-27 MED ORDER — HYDROMORPHONE HCL PF 1 MG/ML IJ SOLN
0.5000 mg | INTRAMUSCULAR | Status: DC | PRN
  Administered 2012-04-28: 0.5 mg via INTRAVENOUS
  Filled 2012-04-27: qty 1

## 2012-04-27 MED ORDER — LIDOCAINE-EPINEPHRINE 1 %-1:100000 IJ SOLN
INTRAMUSCULAR | Status: DC | PRN
  Administered 2012-04-27: 10 mL

## 2012-04-27 MED ORDER — GLYCOPYRROLATE 0.2 MG/ML IJ SOLN
INTRAMUSCULAR | Status: DC | PRN
  Administered 2012-04-27: .5 mg via INTRAVENOUS

## 2012-04-27 MED ORDER — PHENOL 1.4 % MT LIQD: 1.0000 | OROMUCOSAL | Status: DC | PRN

## 2012-04-27 MED ORDER — PANTOPRAZOLE SODIUM 40 MG PO TBEC
40.0000 mg | DELAYED_RELEASE_TABLET | Freq: Every day | ORAL | Status: DC
  Administered 2012-04-28 – 2012-05-01 (×4): 40 mg via ORAL
  Filled 2012-04-27 (×4): qty 1

## 2012-04-27 MED ORDER — ACETAMINOPHEN 650 MG RE SUPP: 650.0000 mg | RECTAL | Status: DC | PRN

## 2012-04-27 MED ORDER — ONDANSETRON HCL 4 MG/2ML IJ SOLN
INTRAMUSCULAR | Status: DC | PRN
  Administered 2012-04-27: 4 mg via INTRAVENOUS

## 2012-04-27 MED ORDER — NEOSTIGMINE METHYLSULFATE 1 MG/ML IJ SOLN
INTRAMUSCULAR | Status: DC | PRN
  Administered 2012-04-27: 3 mg via INTRAVENOUS

## 2012-04-27 MED ORDER — ONDANSETRON HCL 4 MG/2ML IJ SOLN
4.0000 mg | INTRAMUSCULAR | Status: DC | PRN
  Administered 2012-04-28 – 2012-04-30 (×4): 4 mg via INTRAVENOUS
  Filled 2012-04-27 (×3): qty 2

## 2012-04-27 SURGICAL SUPPLY — 56 items
BAG DECANTER FOR FLEXI CONT (MISCELLANEOUS) ×2 IMPLANT
BUR ROUTER D-58 CRANI (BURR) ×1 IMPLANT
CANISTER SUCTION 2500CC (MISCELLANEOUS) ×2 IMPLANT
CATH VENTRIC 35X38 W/TROCAR LG (CATHETERS) ×1 IMPLANT
CLIP TI MEDIUM 6 (CLIP) IMPLANT
CLOTH BEACON ORANGE TIMEOUT ST (SAFETY) ×2 IMPLANT
CONT SPEC 4OZ CLIKSEAL STRL BL (MISCELLANEOUS) ×2 IMPLANT
CORDS BIPOLAR (ELECTRODE) ×2 IMPLANT
DRAPE MICROSCOPE ZEISS OPMI (DRAPES) IMPLANT
DRAPE NEUROLOGICAL W/INCISE (DRAPES) ×2 IMPLANT
DRAPE SURG 17X23 STRL (DRAPES) IMPLANT
DRAPE WARM FLUID 44X44 (DRAPE) ×2 IMPLANT
DRESSING TELFA 8X3 (GAUZE/BANDAGES/DRESSINGS) ×1 IMPLANT
DURAPREP 6ML APPLICATOR 50/CS (WOUND CARE) ×2 IMPLANT
ELECT CAUTERY BLADE 6.4 (BLADE) ×2 IMPLANT
ELECT REM PT RETURN 9FT ADLT (ELECTROSURGICAL) ×2
ELECTRODE REM PT RTRN 9FT ADLT (ELECTROSURGICAL) ×1 IMPLANT
EVACUATOR 1/8 PVC DRAIN (DRAIN) IMPLANT
GAUZE SPONGE 4X4 16PLY XRAY LF (GAUZE/BANDAGES/DRESSINGS) IMPLANT
GLOVE BIO SURGEON STRL SZ8 (GLOVE) ×2 IMPLANT
GOWN BRE IMP SLV AUR LG STRL (GOWN DISPOSABLE) ×1 IMPLANT
GOWN BRE IMP SLV AUR XL STRL (GOWN DISPOSABLE) ×1 IMPLANT
GOWN STRL REIN 2XL LVL4 (GOWN DISPOSABLE) ×1 IMPLANT
HEMOSTAT POWDER KIT SURGIFOAM (HEMOSTASIS) IMPLANT
HOOK DURA (MISCELLANEOUS) ×2 IMPLANT
KIT BASIN OR (CUSTOM PROCEDURE TRAY) ×2 IMPLANT
KIT DRAIN CSF ACCUDRAIN (MISCELLANEOUS) ×1 IMPLANT
KIT ROOM TURNOVER OR (KITS) ×2 IMPLANT
NEEDLE HYPO 22GX1.5 SAFETY (NEEDLE) ×2 IMPLANT
NS IRRIG 1000ML POUR BTL (IV SOLUTION) ×2 IMPLANT
PACK CRANIOTOMY (CUSTOM PROCEDURE TRAY) ×2 IMPLANT
PAD ARMBOARD 7.5X6 YLW CONV (MISCELLANEOUS) ×4 IMPLANT
PATTIES SURGICAL .25X.25 (GAUZE/BANDAGES/DRESSINGS) IMPLANT
PATTIES SURGICAL .5 X.5 (GAUZE/BANDAGES/DRESSINGS) IMPLANT
PATTIES SURGICAL .5 X3 (DISPOSABLE) IMPLANT
PATTIES SURGICAL 1X1 (DISPOSABLE) IMPLANT
PERFORATOR LRG  14-11MM (BIT) ×1
PERFORATOR LRG 14-11MM (BIT) ×1 IMPLANT
PIN MAYFIELD SKULL DISP (PIN) IMPLANT
RUBBERBAND STERILE (MISCELLANEOUS) IMPLANT
SPONGE GAUZE 4X4 12PLY (GAUZE/BANDAGES/DRESSINGS) ×2 IMPLANT
SPONGE NEURO XRAY DETECT 1X3 (DISPOSABLE) IMPLANT
SPONGE SURGIFOAM ABS GEL 100 (HEMOSTASIS) ×2 IMPLANT
STAPLER VISISTAT 35W (STAPLE) ×2 IMPLANT
SUT ETHILON 3 0 FSL (SUTURE) IMPLANT
SUT NURALON 4 0 TR CR/8 (SUTURE) ×4 IMPLANT
SUT VIC AB 2-0 CP2 18 (SUTURE) ×2 IMPLANT
SUT VIC AB 2-0 CT2 18 VCP726D (SUTURE) ×1 IMPLANT
SYR 20ML ECCENTRIC (SYRINGE) ×2 IMPLANT
SYR CONTROL 10ML LL (SYRINGE) ×2 IMPLANT
TAPE CLOTH SURG 4X10 WHT LF (GAUZE/BANDAGES/DRESSINGS) ×1 IMPLANT
TOWEL OR 17X24 6PK STRL BLUE (TOWEL DISPOSABLE) ×2 IMPLANT
TOWEL OR 17X26 10 PK STRL BLUE (TOWEL DISPOSABLE) ×2 IMPLANT
TRAY FOLEY CATH 14FRSI W/METER (CATHETERS) ×1 IMPLANT
UNDERPAD 30X30 INCONTINENT (UNDERPADS AND DIAPERS) IMPLANT
WATER STERILE IRR 1000ML POUR (IV SOLUTION) ×2 IMPLANT

## 2012-04-27 NOTE — ED Notes (Signed)
Patient transported to X-ray 

## 2012-04-27 NOTE — Transfer of Care (Signed)
Immediate Anesthesia Transfer of Care Note  Patient: Nancy [name]  Procedure(s) Performed: Procedure(s) (LRB): CRANIOTOMY HEMATOMA EVACUATION SUBDURAL (Right)  Patient Location: PACU  Anesthesia Type: General  Level of Consciousness: awake, oriented and patient cooperative  Airway & Oxygen Therapy: Patient Spontanous Breathing and Patient connected to face mask oxygen  Post-op Assessment: Report given to PACU RN, Post -op Vital signs reviewed and stable and Patient moving all extremities X 4  Post vital signs: Reviewed and stable  Complications: No apparent anesthesia complications

## 2012-04-27 NOTE — Preoperative (Signed)
Beta Blockers   Reason not to administer Beta Blockers:Not Applicable, took Toprol this AM at 0900

## 2012-04-27 NOTE — ED Provider Notes (Addendum)
History     CSN: [id number]  Arrival date & time 04/27/12  1255   First MD Initiated Contact with Patient 04/27/12 1310      Chief Complaint  Patient presents with  . Cerebrovascular Accident    (Consider location/radiation/quality/duration/timing/severity/associated sxs/prior treatment) HPI 66 year old female p/w progressive HA x 1 week. States that she struck her head several weeks ago when standing up after looking under sink. No LOC. Mild nausea. No focal weakness, sensory changes or visual changes. Pt is on no blood thinners. Pain is throbbing in nature. Outpatient CT reveal chronic subdural bleed.  Past Medical History  Diagnosis Date  . DYSLIPIDEMIA   . Anxiety state, unspecified   . Essential hypertension, benign   . CORONARY ATHEROSCLEROSIS NATIVE CORONARY ARTERY   . CHEST PAIN UNSPECIFIED   . COLONIC POLYPS, HX OF     Past Surgical History  Procedure Date  . Coronary artery bypass graft   . Nasal sinus surgery   . Polypectomy     No family history on file.  History  Substance Use Topics  . Smoking status: Never Smoker   . Smokeless tobacco: Not on file  . Alcohol Use: Not on file    OB History    Grav Para Term Preterm Abortions TAB SAB Ect Mult Living                  Review of Systems  Constitutional: Negative for fever and chills.  Respiratory: Negative for shortness of breath.   Cardiovascular: Negative for chest pain.  Gastrointestinal: Positive for nausea. Negative for vomiting and abdominal pain.  Skin: Negative for rash and wound.  Neurological: Positive for headaches. Negative for dizziness, seizures, syncope, weakness, light-headedness and numbness.    Allergies  Bactrim; Morphine and related; Prednisone; and Sulfonamide derivatives  Home Medications   Current Outpatient Rx  Name Route Sig Dispense Refill  . BEANO PO Oral Take by mouth daily. Take 2-3 pills before each meal a day    . ASPIRIN 81 MG PO TABS Oral Take 81 mg by mouth  daily.      Marland Kitchen VITAMIN D-3 PO Oral Take 1 tablet by mouth daily.     . CO Q 10 PO Oral Take 1 capsule by mouth every other day.    Marland Kitchen DIPHENHYDRAMINE-APAP (SLEEP) 25-500 MG PO TABS Oral Take 1 tablet by mouth at bedtime as needed. For insomnia/headache    . OMEGA-3 FATTY ACIDS 1000 MG PO CAPS Oral Take 1 g by mouth 2 (two) times daily.     Marland Kitchen FLAXSEED (LINSEED) PO Oral Take 1 capsule by mouth daily.     Marland Kitchen LOSARTAN POTASSIUM 25 MG PO TABS  TAKE ONE TABLET BY MOUTH EVERY DAY 30 tablet 6  . METOPROLOL SUCCINATE ER 25 MG PO TB24 Oral Take 75 mg by mouth. 3 po daily    . NITROGLYCERIN 0.4 MG SL SUBL Sublingual Place 0.4 mg under the tongue every 5 (five) minutes as needed. For chest pain    . PANTOPRAZOLE SODIUM 40 MG PO TBEC Oral Take 40 mg by mouth daily.      Marland Kitchen PLAVIX 75 MG PO TABS  TAKE ONE TABLET BY MOUTH EVERY DAY 30 each 6  . POLYETHYLENE GLYCOL 3350 PO PACK Oral Take 17 g by mouth daily.      Marland Kitchen RANITIDINE HCL 75 MG PO TABS Oral Take 75 mg by mouth daily.     Marland Kitchen ROSUVASTATIN CALCIUM 5 MG PO TABS Oral Take  5 mg by mouth See admin instructions. Take 5 mg daily on weekdays    . ZETIA 10 MG PO TABS  TAKE ONE TABLET BY MOUTH EVERY DAY 30 each 5    BP 154/54  Pulse 57  Temp 97.9 F (36.6 C) (Oral)  Resp 16  Ht 5\' 5"  (1.651 m)  Wt 132 lb (59.875 kg)  BMI 21.97 kg/m2  SpO2 100%  Physical Exam  Nursing note and vitals reviewed. Constitutional: She is oriented to person, place, and time. She appears well-developed and well-nourished. No distress.  HENT:  Head: Normocephalic and atraumatic.  Mouth/Throat: Oropharynx is clear and moist.  Eyes: EOM are normal. Pupils are equal, round, and reactive to light.  Neck: Normal range of motion. Neck supple.  Cardiovascular: Normal rate and regular rhythm.   Pulmonary/Chest: Effort normal and breath sounds normal. No respiratory distress. She has no wheezes. She has no rales.  Abdominal: Soft. Bowel sounds are normal. There is no tenderness. There is  no rebound and no guarding.  Musculoskeletal: Normal range of motion. She exhibits no edema and no tenderness.  Neurological: She is alert and oriented to person, place, and time.       5/5 motor, sensation intact, ambulating without difficulty  Skin: Skin is warm and dry. No rash noted. No erythema.  Psychiatric: She has a normal mood and affect. Her behavior is normal.    ED Course  Procedures (including critical care time)   Labs Reviewed  CBC WITH DIFFERENTIAL  COMPREHENSIVE METABOLIC PANEL  PROTIME-INR  APTT   Ct Head Wo Contrast  04/27/2012  *RADIOLOGY REPORT*  Clinical Data: 66 year old female with headache times 1 week, worse with lying down.  No known trauma.  CT HEAD WITHOUT CONTRAST  Technique:  Contiguous axial images were obtained from the base of the skull through the vertex without contrast.  Comparison: None.  Findings: Visualized paranasal sinuses and mastoids are clear. Visualized orbits and scalp soft tissues are within normal limits. No acute osseous abnormality identified.  Calcified atherosclerosis at the skull base.  Mixed density right subdural hematoma measuring between 6 and 11 mm in thickness along most of the right convexity.  Associated leftward midline shift of 10 mm to the left.  Effacement of the right lateral ventricle.  There may be early trapping of the left lateral ventricle, but no definite transependymal edema.  Basilar cisterns remain patent at this time. No evidence of cortically based acute infarction identified.  No suspicious intracranial vascular hyperdensity.  IMPRESSION: 1.  Right subdural hematoma up to 11 mm in thickness and associated with leftward midline shift of 10 mm. 2. Possible early trapping of the left lateral ventricle.  No transependymal edema at this time.  Basilar cisterns remain patent.  Critical Value/emergent results were called by telephone at the time of interpretation on 04/27/2012  at 1225 hours  to  Dr. Leodis Sias, who verbally  acknowledged these results. Arrangements are being made to transport the patient to the [facility] ER.  Original Report Authenticated By: Harley Hallmark, M.D.     1. Subdural hematoma       Date: 04/27/2012  Rate: 58  Rhythm: normal sinus rhythm  QRS Axis: normal  Intervals: normal  ST/T Wave abnormalities: normal  Conduction Disutrbances:none  Narrative Interpretation:   Old EKG Reviewed: unchanged   MDM  Discussed with Dr Yetta Barre. Will see in ED        Loren Racer, MD 04/27/12 1444  Loren Racer,  MD 06/27/12 1019

## 2012-04-27 NOTE — Progress Notes (Signed)
Dr. Yetta Barre at bedside spoke with patient  And Dr. Noreene Larsson came to bedside. Patient states she is just tired

## 2012-04-27 NOTE — Anesthesia Preprocedure Evaluation (Addendum)
Anesthesia Evaluation  Patient identified by MRN, date of birth, ID band Patient awake    Reviewed: Allergy & Precautions, H&P , NPO status , Patient's Chart, lab work & pertinent test results, reviewed documented beta blocker date and time   Airway Mallampati: II TM Distance: >3 FB Neck ROM: Full    Dental  (+) Teeth Intact and Dental Advisory Given   Pulmonary neg pulmonary ROS,  breath sounds clear to auscultation        Cardiovascular hypertension, Pt. on medications and Pt. on home beta blockers + CAD Rhythm:Regular Rate:Normal     Neuro/Psych  Headaches,    GI/Hepatic Neg liver ROS, GERD-  Medicated and Controlled,  Endo/Other  negative endocrine ROS  Renal/GU negative Renal ROS     Musculoskeletal   Abdominal Normal abdominal exam  (+)   Peds  Hematology negative hematology ROS (+)   Anesthesia Other Findings   Reproductive/Obstetrics                           Anesthesia Physical Anesthesia Plan  ASA: II  Anesthesia Plan: General   Post-op Pain Management:    Induction: Intravenous  Airway Management Planned: Oral ETT  Additional Equipment: Arterial line  Intra-op Plan:   Post-operative Plan: Extubation in OR  Informed Consent: I have reviewed the patients History and Physical, chart, labs and discussed the procedure including the risks, benefits and alternatives for the proposed anesthesia with the patient or authorized representative who has indicated his/her understanding and acceptance.   Dental advisory given  Plan Discussed with: Anesthesiologist and Surgeon  Anesthesia Plan Comments:        Anesthesia Quick Evaluation

## 2012-04-27 NOTE — ED Notes (Signed)
C/o h/a x 1 week progressively worsening with nausea no emesis. Denies visual changes, dizziness, lightheaded, extremity weakness.  Denies injury. Was instructed to go to ED after CT scan. States received IM pain med in MD's office approx [phone]

## 2012-04-27 NOTE — ED Notes (Signed)
Pt sent here from [place] [facility] with + bleed

## 2012-04-27 NOTE — Progress Notes (Signed)
Small amount of bleeding noted above left eyebrow form positioning tong form O R .

## 2012-04-27 NOTE — Op Note (Signed)
04/27/2012  5:56 PM  PATIENT:  Nancy [name]  66 y.o. female  PRE-OPERATIVE DIAGNOSIS:  Right chronic subdural hematoma  POST-OPERATIVE DIAGNOSIS:  Same  PROCEDURE:  Right burr hole for evacuation of subdural hematoma with placement of subdural drain  SURGEON:  Marikay Alar, MD  ASSISTANTS: None  ANESTHESIA:   General  EBL: Minimal ml  Total I/O In: 800 [I.V.:800] Out: 350 [Urine:350]  BLOOD ADMINISTERED:none  DRAINS: Subdural drain   SPECIMEN:  No Specimen  INDICATION FOR PROCEDURE: This patient presented with a one-week history of progressive headache. CT scan showed a 1 cm thick right chronic subdural hematoma with midline shift. Recommended either a craniotomy or a burr hole for drainage. Patient understood the risks, benefits, and alternatives and potential outcomes and wished to proceed.  PROCEDURE DETAILS: The patient was taken to the operating room and after induction of adequate generalized endotracheal anesthesia, the head was affixed in a 3 point Mayfield head rest, and turned to the left to expose the right frontotemporal parietal region. The head was shaved just behind the hairline in the right frontotemporal region and then cleaned and then prepped with DuraPrep and draped in the usual sterile fashion. 10 cc of local anesthetic was injected, and a small linear incision was made on the right of the head. A burr hole was placed in the right frontal region utilizing the high-speed, air poweedr drill. The dura was coagulated and then opened in a cruciate fashion. Was release of a greenish brownish thin subdural hematoma.  A hematoma was then removed with a combination of irrigation and suction. I continued to irrigate until the irrigant was clear. I fenestrated to thin membranes. I then placed a subdural drain through separate stab incision. Small pieces of Gelfoam were placed at the burr hole site. the galea was then closed with interrupted 2-0 Vicryl suture. The skin was  then closed with staples a sterile dressing was applied. The patient was then taken out of the 3-point Mayfield headrest and awakened from general anesthesia, and transported to the recovery room in stable condition. At the end of the procedure all sponge, needle, and instrument counts were correct.  PLAN OF CARE: Admit to inpatient   PATIENT DISPOSITION:  PACU - hemodynamically stable.   Delay start of Pharmacological VTE agent (>24hrs) due to surgical blood loss or risk of bleeding:  yes

## 2012-04-27 NOTE — ED Notes (Signed)
Neurosurgeon, Dr. Jones at bedside. 

## 2012-04-27 NOTE — ED Notes (Signed)
Returned from [employer] [facility]. Consent obtained. Taken to Neuro OR

## 2012-04-27 NOTE — Progress Notes (Signed)
Site above left eyebrow has stopped bleeding

## 2012-04-27 NOTE — Anesthesia Postprocedure Evaluation (Signed)
  Anesthesia Post-op Note  Patient: Nancy [name]  Procedure(s) Performed: Procedure(s) (LRB): CRANIOTOMY HEMATOMA EVACUATION SUBDURAL (Right)  Patient Location: PACU  Anesthesia Type: General  Level of Consciousness: awake and sedated  Airway and Oxygen Therapy: Patient Spontanous Breathing and Patient connected to nasal cannula oxygen  Post-op Pain: none  Post-op Assessment: Post-op Vital signs reviewed and Patient's Cardiovascular Status Stable  Post-op Vital Signs: stable  Complications: No apparent anesthesia complications

## 2012-04-27 NOTE — H&P (Signed)
Reason for Consult:R SDH Referring Physician: EDP  Nancy [name] is an 66 y.o. female.   HPI:  This is a 66 year old female who presented to the emergency department today with a one-week history of progressive headache. She has had some nausea but no vomiting. She denies visual changes or numbness tingling or weakness or changing gait. She does remember an incident about 3 weeks ago in which she bumped her head, no loss of consciousness, and she really did not think much of it. She presented today with the headaches and a CT scan was ordered which showed a right chronic subdural hematoma and neurosurgical by which was requested. The headache is throbbing or aching in character, and appears fairly holocephalic.  Past Medical History  Diagnosis Date  . DYSLIPIDEMIA   . Anxiety state, unspecified   . Essential hypertension, benign   . CORONARY ATHEROSCLEROSIS NATIVE CORONARY ARTERY   . CHEST PAIN UNSPECIFIED   . COLONIC POLYPS, HX OF     Past Surgical History  Procedure Date  . Coronary artery bypass graft   . Nasal sinus surgery   . Polypectomy     Allergies  Allergen Reactions  . Bactrim (Sulfamethoxazole W-Trimethoprim)     Unknown  . Morphine And Related     Unknown  . Prednisone     Unknown  . Sulfonamide Derivatives     Unknown    History  Substance Use Topics  . Smoking status: Never Smoker   . Smokeless tobacco: Not on file  . Alcohol Use: Not on file    No family history on file.   Review of Systems  Positive ROS: Negative  All other systems have been reviewed and were otherwise negative with the exception of those mentioned in the HPI and as above.  Objective: Vital signs in last 24 hours: Temp:  [97.9 F (36.6 C)] 97.9 F (36.6 C) (07/10 1259) Pulse Rate:  [57-65] 57  (07/10 1430) Resp:  [15-16] 16  (07/10 1430) BP: (144-157)/(54-87) 154/54 mmHg (07/10 1430) SpO2:  [99 %-100 %] 100 % (07/10 1430) Weight:  [59.875 kg (132 lb)] 59.875 kg (132 lb)  (07/10 1324)  General Appearance: Alert, cooperative, no distress, appears stated age Head: Normocephalic, without obvious abnormality, atraumatic Eyes: PERRL, conjunctiva/corneas clear, EOM's intact, fundi benign, both eyes      Ears: Normal TM's and external ear canals, both ears Throat: benign Neck: Supple Lungs: Clear to auscultation bilaterally, respirations unlabored Heart: Regular rate and rhythm, S1 and S2 normal, no murmur, rub or gallop Abdomen: Soft, non-tender, bowel sounds active all four quadrants, no masses, no organomegaly Extremities: Extremities normal, atraumatic, no cyanosis or edema Pulses: 2+ and symmetric all extremities Skin: Skin color, texture, turgor normal, no rashes or lesions  NEUROLOGIC:   Mental status: A&O x4, no aphasia, good attention span, Memory and fund of knowledge Motor Exam - grossly normal, normal tone and bulk. No drift Sensory Exam - grossly normal Reflexes: symmetric, no pathologic reflexes, No Hoffman's, No clonus Coordination - grossly normal Gait -not tested Balance - not tested Cranial Nerves: I: smell Not tested  II: visual acuity  OS: Okay    OD: Okay   II: visual fields Full to confrontation  II: pupils Equal, round, reactive to light  III,VII: ptosis None  III,IV,VI: extraocular muscles  Full ROM  V: mastication Normal  V: facial light touch sensation  Normal  V,VII: corneal reflex  Present  VII: facial muscle function - upper  Normal  VII:  facial muscle function - lower Normal  VIII: hearing Not tested  IX: soft palate elevation  Normal  IX,X: gag reflex Present  XI: trapezius strength  5/5  XI: sternocleidomastoid strength 5/5  XI: neck flexion strength  5/5  XII: tongue strength  Normal    Data Review Lab Results  Component Value Date   WBC 5.7 06/16/2011   HGB 13.1 06/16/2011   HCT 38.0 06/16/2011   MCV 89.2 06/16/2011   PLT 183 06/16/2011   Lab Results  Component Value Date   NA 144 06/16/2011   K 3.6  06/16/2011   CL 111 06/16/2011   CO2 25 06/16/2011   BUN 12 06/16/2011   CREATININE 0.88 08/29/2011   GLUCOSE 86 06/16/2011   Lab Results  Component Value Date   INR 0.94 06/15/2011    Radiology: No results found. CT scan: Shows a right extra-axial hypodense fluid collection consistent with a chronic subdural hematoma measuring about 1 cm in width, with some mass effect and some shift. Basal cisterns are open  Assessment/Plan: 66 year old female with a right chronic subdural hematoma 3 weeks after a fairly minor head trauma. She does take aspirin and Plavix. She is neurologically intact.. her headaches are progressive. There is some shift and mass effect with the subdural hematoma and it does measure about a centimeter in width. I am recommending either burr holes or a small formal craniotomy for evacuation of the subdural hematoma. I think there is some risk and not doing so, as this will continue to enlarge most likely. Have explained the reasoning for the surgery to she and her family. I have discussed how the surgery is performed. I have discussed the likelihood of success. They understand that the risk of the surgery include but are not limited to bleeding, infection, stroke, numbness, weakness, paralysis, visual loss, re\re bleeding, possible need for further surgery, anesthesia risk, and death. They understand this and wished to proceed. she would then be admitted to the neurosurgical ICU postoperatively.   JONES,DAVID S 04/27/2012 2:57 PM

## 2012-04-28 ENCOUNTER — Inpatient Hospital Stay (HOSPITAL_COMMUNITY): Payer: Medicare Other

## 2012-04-28 ENCOUNTER — Encounter (HOSPITAL_COMMUNITY): Payer: Self-pay | Admitting: Radiology

## 2012-04-28 NOTE — Progress Notes (Signed)
Patient ID: Nancy [name], female   DOB: 07-25-1946, 66 y.o.   MRN: [id number] Patient is doing extremely well. She has no headache. She has appropriate soreness at the bur hole site. The dressing is dry. She is awake and alert and pleasant and conversant. No aphasia. Good attention span. All extremities appropriately with good strength. It CT looks good with decreased mass effect and shift. I removed her subdural drain today. We will mobilize her. Pleased with her progress.

## 2012-04-29 MED ORDER — ACETAMINOPHEN 325 MG PO TABS
650.0000 mg | ORAL_TABLET | ORAL | Status: DC | PRN
  Administered 2012-04-29 – 2012-04-30 (×3): 650 mg via ORAL
  Filled 2012-04-29 (×3): qty 2

## 2012-04-29 MED ORDER — ACETAMINOPHEN 650 MG RE SUPP: 650.0000 mg | RECTAL | Status: DC | PRN

## 2012-04-29 MED ORDER — ZOLPIDEM TARTRATE 5 MG PO TABS
5.0000 mg | ORAL_TABLET | Freq: Every evening | ORAL | Status: DC | PRN
  Administered 2012-04-30 – 2012-05-01 (×2): 5 mg via ORAL
  Filled 2012-04-29 (×2): qty 1

## 2012-04-29 MED ORDER — ACETAMINOPHEN 325 MG PO TABS: 650.0000 mg | ORAL_TABLET | ORAL | Status: DC | PRN

## 2012-04-29 NOTE — Evaluation (Signed)
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  Pt. appears to be at baseline level of functioning.  No deficits noted.  No further OT indicated    OT Assessment  Patient does not need any further OT services    Follow Up Recommendations  No OT follow up    Barriers to Discharge      Equipment Recommendations  None recommended by OT    Recommendations for Other Services    Frequency       Precautions / Restrictions Precautions Precautions: None Restrictions Weight Bearing Restrictions: No       ADL  Eating/Feeding: Simulated;Independent Where Assessed - Eating/Feeding: Chair Grooming: Simulated;Wash/dry hands;Wash/dry face;Teeth care;Brushing hair;Independent Where Assessed - Grooming: Unsupported standing Upper Body Bathing: Simulated;Independent Where Assessed - Upper Body Bathing: Unsupported standing Lower Body Bathing: Simulated;Independent Where Assessed - Lower Body Bathing: Unsupported standing Upper Body Dressing: Simulated;Independent Where Assessed - Upper Body Dressing: Unsupported standing Lower Body Dressing: Simulated;Independent Where Assessed - Lower Body Dressing: Unsupported standing Toilet Transfer: Performed;Independent Toilet Transfer Method: Sit to Barista: Comfort height toilet Toileting - Clothing Manipulation and Hygiene: Simulated;Independent Where Assessed - Toileting Clothing Manipulation and Hygiene: Standing Tub/Shower Transfer: Simulated;Independent Tub/Shower Transfer Method: Science writer: Walk in shower Transfers/Ambulation Related to ADLs: Independent ADL Comments: Pt. able to stand on one foot independently for  simulated shower.  Husband reports he has noticed no cognitive or behavioral changes    OT Diagnosis:    OT Problem List:   OT Treatment Interventions:     OT Goals    Visit Information  Last OT Received On: 04/29/12 Assistance Needed: +1 PT/OT Co-Evaluation/Treatment: Yes    Subjective Data  Subjective: "I feel pretty good" Patient Stated Goal: To get back to normal   Prior Functioning  Vision/Perception  Home Living Lives With: Spouse Available Help at Discharge: Family Type of Home: House Home Access: Stairs to enter [employer] of Steps: 4 Entrance Stairs-Rails: Can reach both Home Layout: One level Bathroom Shower/Tub: Walk-in shower;Tub only Bathroom Toilet: Handicapped height Home Adaptive Equipment: Grab bars in shower;Built-in shower seat Prior Function Level of Independence: Independent Able to Take Stairs?: Yes Driving: Yes Vocation: Retired Musician: No difficulties Dominant Hand: Right   Vision - Assessment Eye Alignment: Within Functional Limits Additional Comments: Pt. able to read a page of info. with no difficulties.  Able to locate items in room independently Perception Perception: Within Functional Limits Praxis Praxis: Intact  Cognition  Overall Cognitive Status: Appears within functional limits for tasks assessed/performed Arousal/Alertness: Awake/alert Orientation Level: Oriented X4 / Intact Behavior During Session: [facility] for tasks performed    Extremity/Trunk Assessment Right Upper Extremity Assessment RUE ROM/Strength/Tone: Within functional levels RUE Sensation: WFL - Light Touch RUE Coordination: WFL - gross/fine motor Left Upper Extremity Assessment LUE ROM/Strength/Tone: Within functional levels LUE Sensation: WFL - Light Touch LUE Coordination: WFL - gross/fine motor Right Lower Extremity Assessment RLE ROM/Strength/Tone: Within functional levels RLE Sensation: Deficits RLE Coordination: WFL -  gross/fine motor Left Lower Extremity Assessment LLE ROM/Strength/Tone: Within functional levels LLE Sensation: Deficits LLE Sensation Deficits: Bil feet numbness LLE Coordination: WFL - gross/fine motor Trunk Assessment Trunk Assessment: Normal   Mobility Bed Mobility Bed Mobility: Supine to Sit;Sitting - Scoot to [place]  of Bed;Sit to Supine Supine to Sit: 7: Independent Sitting - Scoot to Edge of Bed: 7: Independent Sit to Supine: 7: Independent Transfers Transfers: Sit to Stand;Stand to Sit Sit to Stand: 7: Independent Stand to Sit: 7: Independent   Exercise    Balance Balance Balance Assessed: Yes High Level Balance High Level Balance Comments: See PT eval for details  End of Session OT - End of Session Activity Tolerance: Patient tolerated treatment well Patient left: in bed;with call bell/phone within reach;with family/visitor present Nurse Communication: Mobility status  GO     Nickoles Gregori M 04/29/2012, 4:12 PM

## 2012-04-29 NOTE — Evaluation (Signed)
Physical Therapy Evaluation Patient Details Name: Nancy [name] MRN: [id number] DOB: 09/04/1946 Today's Date: 04/29/2012 Time: 9147-8295 PT Time Calculation (min): 22 min  PT Assessment / Plan / Recommendation Clinical Impression  pt s/p evacuation of chronic SDH.  Pt is I at time of evaluation.  No further PT needs    PT Assessment  Patent does not need any further PT services    Follow Up Recommendations  No PT follow up    Barriers to Discharge        Equipment Recommendations  None recommended by PT    Recommendations for Other Services     Frequency      Precautions / Restrictions Precautions Precautions: None Restrictions Weight Bearing Restrictions: No   Pertinent Vitals/Pain 2/10 HA       Mobility  Bed Mobility Bed Mobility: Supine to Sit;Sitting - Scoot to Edge of Bed;Sit to Supine Supine to Sit: 7: Independent Sitting - Scoot to Edge of Bed: 7: Independent Sit to Supine: 7: Independent Transfers Transfers: Sit to Stand;Stand to Sit Sit to Stand: 7: Independent Stand to Sit: 7: Independent Ambulation/Gait Ambulation/Gait Assistance: 7: Independent Ambulation Distance (Feet): 600 Feet Assistive device: None Gait Pattern: Within Functional Limits Stairs: Yes Stairs Assistance: 7: Independent Stair Management Technique: No rails;One rail Right;Forwards;Alternating pattern Number of Stairs: 8     Exercises     PT Diagnosis:    PT Problem List:   PT Treatment Interventions:     PT Goals    Visit Information  Last PT Received On: 04/29/12 Assistance Needed: +1    Subjective Data  Subjective: no, I haven't noticed any weakness Patient Stated Goal: Home with my dogs, I   Prior Functioning  Home Living Lives With: Spouse Available Help at Discharge: Family Type of Home: House Home Access: Stairs to enter Secretary/administrator of Steps: 4 Entrance Stairs-Rails: Can reach both Home Layout: One level Bathroom Shower/Tub: Walk-in  shower;Tub only Bathroom Toilet: Handicapped height Home Adaptive Equipment: Grab bars in shower;Built-in shower seat Prior Function Level of Independence: Independent Able to Take Stairs?: Yes Driving: Yes Vocation: Retired Musician: No difficulties Dominant Hand: Right    Cognition  Overall Cognitive Status: Appears within functional limits for tasks assessed/performed Arousal/Alertness: Awake/alert Orientation Level: Oriented X4 / Intact Behavior During Session: WFL for tasks performed    Extremity/Trunk Assessment Right Upper Extremity Assessment RUE ROM/Strength/Tone: Within functional levels RUE Sensation: WFL - Light Touch RUE Coordination: WFL - gross/fine motor Left Upper Extremity Assessment LUE ROM/Strength/Tone: Within functional levels LUE Sensation: WFL - Light Touch LUE Coordination: WFL - gross/fine motor Right Lower Extremity Assessment RLE ROM/Strength/Tone: Within functional levels RLE Sensation: Deficits RLE Coordination: WFL - gross/fine motor Left Lower Extremity Assessment LLE ROM/Strength/Tone: Within functional levels LLE Sensation: Deficits LLE Sensation Deficits: Bil feet numbness LLE Coordination: WFL - gross/fine motor Trunk Assessment Trunk Assessment: Normal   Balance Balance Balance Assessed: Yes High Level Balance High Level Balance Activites: Backward walking;Direction changes;Turns;Sudden stops;Head turns High Level Balance Comments: no lob or deviation  End of Session PT - End of Session Activity Tolerance: Patient tolerated treatment well Patient left: in bed;with call bell/phone within reach Nurse Communication: Mobility status  GP     Cynthie Garmon, Eliseo Gum 04/29/2012, 4:12 PM  04/29/2012  [place] Bing, PT [phone] (pager)

## 2012-04-29 NOTE — Progress Notes (Signed)
Patient ID: Nancy [name], female   DOB: 11-15-1945, 66 y.o.   MRN: [id number] Patient is doing very well. Incision clean dry and intact. No headache but does have some right facial pain. Is mobilizing nicely. Has good strength. Awake and alert and conversant. To the floor. PT and OT, possibly home setting. Followup head CT in the morning.

## 2012-04-30 ENCOUNTER — Inpatient Hospital Stay (HOSPITAL_COMMUNITY): Payer: Medicare Other

## 2012-04-30 NOTE — Progress Notes (Signed)
Subjective: Patient reports Overall she feels okay lobe or headache this morning but otherwise occasional nausea that's not any worse it was no numbness in the arms and legs no vision trouble  Objective: Vital signs in last 24 hours: Temp:  [97.8 F (36.6 C)-98.6 F (37 C)] 98.4 F (36.9 C) (07/13 0628) Pulse Rate:  [58-64] 62  (07/13 0628) Resp:  [15-18] 18  (07/13 0628) BP: (105-148)/(45-71) 132/55 mmHg (07/13 0628) SpO2:  [90 %-100 %] 98 % (07/13 0628)  Intake/Output from previous day: 07/12 0701 - 07/13 0700 In: 340 [P.O.:240; I.V.:100] Out: 600 [Urine:600] Intake/Output this shift:    She is awake alert oriented strength 5 out of 5 wound is clean and dry  Lab Results:  Basename 04/27/12 1445  WBC 8.6  HGB 14.3  HCT 41.1  PLT 242   BMET  Basename 04/27/12 1445  NA 139  K 4.3  CL 100  CO2 26  GLUCOSE 86  BUN 9  CREATININE 0.82  CALCIUM 9.9    Studies/Results: No results found.  Assessment/Plan: Repeated CT shows a stable resolving pneumocephalus persistent fluid on the right frontal lobe still some mass effect we'll continue to observe hopefully this start being reabsorbed by the brain plan followup CT scan Monday morning  LOS: 3 days     Nakisha Chai P 04/30/2012, 9:44 AM

## 2012-05-01 MED ORDER — POLYETHYLENE GLYCOL 3350 17 G PO PACK
17.0000 g | PACK | Freq: Two times a day (BID) | ORAL | Status: DC | PRN
  Filled 2012-05-01: qty 1

## 2012-05-01 MED ORDER — FLEET ENEMA 7-19 GM/118ML RE ENEM
1.0000 | ENEMA | Freq: Every day | RECTAL | Status: DC | PRN
  Administered 2012-05-01: 1 via RECTAL
  Filled 2012-05-01: qty 1

## 2012-05-01 MED ORDER — ONDANSETRON HCL 4 MG PO TABS
4.0000 mg | ORAL_TABLET | Freq: Three times a day (TID) | ORAL | Status: DC | PRN
  Administered 2012-05-01: 4 mg via ORAL
  Filled 2012-05-01: qty 1

## 2012-05-01 MED ORDER — SENNA 8.6 MG PO TABS
1.0000 | ORAL_TABLET | Freq: Every day | ORAL | Status: DC
  Administered 2012-05-01: 8.6 mg via ORAL
  Filled 2012-05-01 (×2): qty 1

## 2012-05-01 MED ORDER — BISACODYL 10 MG RE SUPP
10.0000 mg | Freq: Every day | RECTAL | Status: DC | PRN
  Administered 2012-05-01: 10 mg via RECTAL
  Filled 2012-05-01: qty 1

## 2012-05-01 NOTE — Progress Notes (Signed)
Subjective: Patient reports nausea some headache but generally feeling fair  Objective: Vital signs in last 24 hours: Temp:  [98 F (36.7 C)-98.2 F (36.8 C)] 98.2 F (36.8 C) (07/14 0600) Pulse Rate:  [59-67] 67  (07/14 0600) Resp:  [18-19] 18  (07/14 0600) BP: (131-136)/(43-56) 133/54 mmHg (07/14 0600) SpO2:  [96 %-100 %] 96 % (07/14 0600)  Intake/Output from previous day: 07/13 0701 - 07/14 0700 In: 1340 [P.O.:480; I.V.:860] Out: -  Intake/Output this shift: Total I/O In: 360 [P.O.:360] Out: -   No evidence of a drift motor function appears stable incision on scalp appears clean and dry.  Lab Results: No results found for this basename: WBC:2,HGB:2,HCT:2,PLT:2 in the last 72 hours BMET No results found for this basename: NA:2,K:2,CL:2,CO2:2,GLUCOSE:2,BUN:2,CREATININE:2,CALCIUM:2 in the last 72 hours  Studies/Results: Ct Head Wo Contrast  04/30/2012  *RADIOLOGY REPORT*  Clinical Data: Follow-up subdural.  CT HEAD WITHOUT CONTRAST  Technique:  Contiguous axial images were obtained from the base of the skull through the vertex without contrast.  Comparison: 04/28/2012  Findings: Status post drainage of the right subdural hematoma. Subdural drain has been removed.  Slight increased thickness extra- axial fluid collection, predominately hypodense, with pneumocephalus compared to yesterday's scan, 12 mm measured approximately 2 cm posterior and 1 cm superior to the burr hole (image 20).  Continued surveillance is warranted.  Continued right- to-left shift measuring 8 mm at the level of the septum pellucidum on image 13.  IMPRESSION: Slight increased thickness subdural hematoma following subdural drain removal.  Continued right-to-left shift 8 mm.  Continued surveillance is warranted.  Original Report Authenticated By: Elsie Stain, M.D.    Assessment/Plan: Stable postop for CT scan in am  LOS: 4 days  CT in am, follow for subdural   Brighton Pilley J 05/01/2012, 9:48 AM

## 2012-05-02 ENCOUNTER — Telehealth: Payer: Self-pay | Admitting: Cardiology

## 2012-05-02 ENCOUNTER — Encounter (HOSPITAL_COMMUNITY): Payer: Self-pay | Admitting: Radiology

## 2012-05-02 ENCOUNTER — Inpatient Hospital Stay (HOSPITAL_COMMUNITY): Payer: Medicare Other

## 2012-05-02 NOTE — Discharge Summary (Signed)
Physician Discharge Summary  Patient ID: Nancy [name] MRN: [id number] DOB/AGE: [birth year] 66 y.o.  Admit date: 04/27/2012 Discharge date: 05/02/2012  Admission Diagnoses: L SDH    Discharge Diagnoses: same   Discharged Condition: good  Hospital Course: The patient was admitted on 04/27/2012 and taken to the operating room where the patient underwent L bur hole for Chronic SDH. The patient tolerated the procedure well and was taken to the recovery room and then to the ICU in stable condition. The hospital course was routine. There were no complications. Follow up head CT appeared as expected. The wound remained clean dry and intact. Pt had appropriate head soreness. No complaints of new pain or new N/T/W. The patient remained afebrile with stable vital signs, and tolerated a regular diet. The patient continued to increase activities, and pain was well controlled with oral pain medications.   Consults: None  Significant Diagnostic Studies:  Results for orders placed during the hospital encounter of 04/27/12  CBC WITH DIFFERENTIAL      Component Value Range   WBC 8.6  4.0 - 10.5 K/uL   RBC 4.53  3.87 - 5.11 MIL/uL   Hemoglobin 14.3  12.0 - 15.0 g/dL   HCT 13.2  44.0 - 10.2 %   MCV 90.7  78.0 - 100.0 fL   MCH 31.6  26.0 - 34.0 pg   MCHC 34.8  30.0 - 36.0 g/dL   RDW 72.5  36.6 - 44.0 %   Platelets 242  150 - 400 K/uL   Neutrophils Relative 67  43 - 77 %   Neutro Abs 5.7  1.7 - 7.7 K/uL   Lymphocytes Relative 24  12 - 46 %   Lymphs Abs 2.1  0.7 - 4.0 K/uL   Monocytes Relative 7  3 - 12 %   Monocytes Absolute 0.6  0.1 - 1.0 K/uL   Eosinophils Relative 2  0 - 5 %   Eosinophils Absolute 0.1  0.0 - 0.7 K/uL   Basophils Relative 1  0 - 1 %   Basophils Absolute 0.1  0.0 - 0.1 K/uL  COMPREHENSIVE METABOLIC PANEL      Component Value Range   Sodium 139  135 - 145 mEq/L   Potassium 4.3  3.5 - 5.1 mEq/L   Chloride 100  96 - 112 mEq/L   CO2 26  19 - 32 mEq/L   Glucose, Bld 86  70 -  99 mg/dL   BUN 9  6 - 23 mg/dL   Creatinine, Ser 3.47  0.50 - 1.10 mg/dL   Calcium 9.9  8.4 - 42.5 mg/dL   Total Protein 7.8  6.0 - 8.3 g/dL   Albumin 4.5  3.5 - 5.2 g/dL   AST 26  0 - 37 U/L   ALT 23  0 - 35 U/L   Alkaline Phosphatase 79  39 - 117 U/L   Total Bilirubin 0.3  0.3 - 1.2 mg/dL   GFR calc non Af Amer 73 (*) >90 mL/min   GFR calc Af Amer 85 (*) >90 mL/min  PROTIME-INR      Component Value Range   Prothrombin Time 13.3  11.6 - 15.2 seconds   INR 0.99  0.00 - 1.49  APTT      Component Value Range   aPTT 29  24 - 37 seconds  POCT I-STAT TROPONIN I      Component Value Range   Troponin i, poc 0.00  0.00 - 0.08 ng/mL   Comment 3  MRSA PCR SCREENING      Component Value Range   MRSA by PCR NEGATIVE  NEGATIVE  GLUCOSE, CAPILLARY      Component Value Range   Glucose-Capillary 116 (*) 70 - 99 mg/dL   Comment 1 Notify RN     Comment 2 Documented in Chart      Dg Chest 2 View  04/27/2012  *RADIOLOGY REPORT*  Clinical Data: 3-week history of stabbing headaches, worse over the past week.  Prior CABG.  CHEST - 2 VIEW  Comparison: Two-view chest x-ray 06/15/2011, 02/17/2010, 08/26/2007.  Findings: Prior sternotomy for CABG.  Cardiac silhouette normal in size, unchanged.  Hilar and mediastinal contours unremarkable. Stable hyperinflation and scarring in the left lower lobe.  Lungs otherwise clear.  No pleural effusions.  Mild degenerative changes throughout the thoracic spine.  Osteopenia.  Numerous round opacities overlying the upper chest and scapulae bilaterally is felt to be artifact and external to the patient, as they were not visible on prior examinations.  IMPRESSION: Hyperinflation consistent with COPD and/or asthma.  Stable scarring in the left lower lobe.  No acute cardiopulmonary disease.  Original Report Authenticated By: Arnell Sieving, M.D.   Ct Head Wo Contrast  05/02/2012  *RADIOLOGY REPORT*  Clinical Data: 66 year old female with subdural hematoma  status post drainage.  CT HEAD WITHOUT CONTRAST  Technique:  Contiguous axial images were obtained from the base of the skull through the vertex without contrast.  Comparison: 04/30/2012 and earlier.  Findings: Stable paranasal sinuses and mastoids. Stable visualized osseous structures.  Sequelae of right frontal burr hole placement. Overlying scalp soft tissues are stable with no adverse features. Visualized orbit soft tissues are within normal limits.  Calcified atherosclerosis at the skull base.  Stable small volume of pneumocephalus.  Mixed density right subdural hematoma persists measuring up to 13 mm in thickness.  The configuration of hyperdense blood products has not changed. Leftward midline shift of 8 mm is stable.  A small volume of hemorrhage layering on the tentorium is again noted.  The suprasellar and other basilar cisterns are stable and remaining fairly patent.  Stable mass effect on the ventricles without convincing ventriculomegaly. Stable gray-white matter differentiation throughout the brain.  No evidence of cortically based acute infarction identified.  No new intracranial hemorrhage.  IMPRESSION: 1.  Unchanged appearance of residual right subdural hematoma 12-13 mm in thickness.  Stable mass effect with leftward midline shift of 8 mm. 2.  No new intracranial abnormality.  Original Report Authenticated By: Harley Hallmark, M.D.   Ct Head Wo Contrast  04/30/2012  *RADIOLOGY REPORT*  Clinical Data: Follow-up subdural.  CT HEAD WITHOUT CONTRAST  Technique:  Contiguous axial images were obtained from the base of the skull through the vertex without contrast.  Comparison: 04/28/2012  Findings: Status post drainage of the right subdural hematoma. Subdural drain has been removed.  Slight increased thickness extra- axial fluid collection, predominately hypodense, with pneumocephalus compared to yesterday's scan, 12 mm measured approximately 2 cm posterior and 1 cm superior to the burr hole (image  20).  Continued surveillance is warranted.  Continued right- to-left shift measuring 8 mm at the level of the septum pellucidum on image 13.  IMPRESSION: Slight increased thickness subdural hematoma following subdural drain removal.  Continued right-to-left shift 8 mm.  Continued surveillance is warranted.  Original Report Authenticated By: Elsie Stain, M.D.   Ct Head Wo Contrast  04/28/2012  *RADIOLOGY REPORT*  Clinical Data: Followup subdural hematoma.  CT HEAD  WITHOUT CONTRAST  Technique:  Contiguous axial images were obtained from the base of the skull through the vertex without contrast.  Comparison: 04/27/2012.  Findings: Right-sided burr hole with placement of right sided drain with decrease in size of right-sided subdural hematoma.  Residual subdural hematoma measures up to 5.8 mm.  Pneumocephalus.  Mass effect with midline shift to the left by 7.3 mm versus prior 9.8 mm.  No acute thrombotic infarct.  No intracranial mass lesion detected on this unenhanced examination as separate from the above described findings.  Vascular calcifications.  IMPRESSION: Interval drainage of right-sided subdural hematoma with decrease in degree of mass effect and midline shift as noted above.  Original Report Authenticated By: Fuller Canada, M.D.   Ct Head Wo Contrast  04/27/2012  *RADIOLOGY REPORT*  Clinical Data: 66 year old female with headache times 1 week, worse with lying down.  No known trauma.  CT HEAD WITHOUT CONTRAST  Technique:  Contiguous axial images were obtained from the base of the skull through the vertex without contrast.  Comparison: None.  Findings: Visualized paranasal sinuses and mastoids are clear. Visualized orbits and scalp soft tissues are within normal limits. No acute osseous abnormality identified.  Calcified atherosclerosis at the skull base.  Mixed density right subdural hematoma measuring between 6 and 11 mm in thickness along most of the right convexity.  Associated leftward midline  shift of 10 mm to the left.  Effacement of the right lateral ventricle.  There may be early trapping of the left lateral ventricle, but no definite transependymal edema.  Basilar cisterns remain patent at this time. No evidence of cortically based acute infarction identified.  No suspicious intracranial vascular hyperdensity.  IMPRESSION: 1.  Right subdural hematoma up to 11 mm in thickness and associated with leftward midline shift of 10 mm. 2. Possible early trapping of the left lateral ventricle.  No transependymal edema at this time.  Basilar cisterns remain patent.  Critical Value/emergent results were called by telephone at the time of interpretation on 04/27/2012  at 1225 hours  to  Dr. Leodis Sias, who verbally acknowledged these results. Arrangements are being made to transport the patient to the [facility] ER.  Original Report Authenticated By: Harley Hallmark, M.D.    Antibiotics:  Anti-infectives     Start     Dose/Rate Route Frequency Ordered Stop   04/27/12 2100   ceFAZolin (ANCEF) IVPB 1 g/50 mL premix     Comments: AET 1819      1 g 100 mL/hr over 30 Minutes Intravenous Every 8 hours 04/27/12 2009 04/28/12 1314   04/27/12 1707   bacitracin 50,000 Units in sodium chloride irrigation 0.9 % 500 mL irrigation  Status:  Discontinued          As needed 04/27/12 1741 04/27/12 1812          Discharge Exam: Blood pressure 137/65, pulse 68, temperature 98.3 F (36.8 C), temperature source Oral, resp. rate 18, height 5\' 5"  (1.651 m), weight 60.7 kg (133 lb 13.1 oz), SpO2 96.00%. Neurologic: Grossly normal Incision CDI  Discharge Medications:   Medication List  As of 05/02/2012  8:27 AM   STOP taking these medications         PLAVIX 75 MG tablet         TAKE these medications         aspirin 81 MG tablet   Take 81 mg by mouth daily.      BEANO PO  Take by mouth daily. Take 2-3 pills before each meal a day      CO Q 10 PO   Take 1 capsule by mouth every other  day.      diphenhydramine-acetaminophen 25-500 MG Tabs   Commonly known as: TYLENOL PM   Take 1 tablet by mouth at bedtime as needed. For insomnia/headache      fish oil-omega-3 fatty acids 1000 MG capsule   Take 1 g by mouth 2 (two) times daily.      FLAXSEED (LINSEED) PO   Take 1 capsule by mouth daily.      losartan 25 MG tablet   Commonly known as: COZAAR   TAKE ONE TABLET BY MOUTH EVERY DAY      metoprolol succinate 25 MG 24 hr tablet   Commonly known as: TOPROL-XL   Take 75 mg by mouth. 3 po daily      nitroGLYCERIN 0.4 MG SL tablet   Commonly known as: NITROSTAT   Place 0.4 mg under the tongue every 5 (five) minutes as needed. For chest pain      pantoprazole 40 MG tablet   Commonly known as: PROTONIX   Take 40 mg by mouth daily.      polyethylene glycol packet   Commonly known as: MIRALAX / GLYCOLAX   Take 17 g by mouth daily.      ranitidine 75 MG tablet   Commonly known as: ZANTAC   Take 75 mg by mouth daily.      rosuvastatin 5 MG tablet   Commonly known as: CRESTOR   Take 5 mg by mouth See admin instructions. Take 5 mg daily on weekdays      VITAMIN D-3 PO   Take 1 tablet by mouth daily.      ZETIA 10 MG tablet   Generic drug: ezetimibe   TAKE ONE TABLET BY MOUTH EVERY DAY            Disposition: home   Final Dx: Ines Bloomer hole for CSDH  Discharge Orders    Future Orders Please Complete By Expires   Diet - low sodium heart healthy      Increase activity slowly      Driving Restrictions      Comments:   No driving   Call MD for:  temperature >100.4      Call MD for:  persistant nausea and vomiting      Call MD for:  severe uncontrolled pain      Call MD for:  redness, tenderness, or signs of infection (pain, swelling, redness, odor or green/yellow discharge around incision site)      Call MD for:  difficulty breathing, headache or visual disturbances         Follow-up Information    Follow up with Tamel Abel S, MD. Schedule an appointment  as soon as possible for a visit in 1 week.   Contact information:   [address], [address] [place] [address] [phone]           Signed: Tia Alert 05/02/2012, 8:27 AM

## 2012-05-02 NOTE — Telephone Encounter (Signed)
New msg Pt was in hospital and she has been off plavix since last wed. She wants to know should she continue taking this med.

## 2012-05-02 NOTE — Telephone Encounter (Signed)
Called patient back. She states that she was released from [facility] today after a subdural hematoma by Dr.David Jones. He has her Plavix on hold but she is not sure how much longer to hold. He advised her that he would be in touch with Dr.Hochrein but she has not been notified of Plavix instructions. Advised will ask Dr.Hochrein tomorrow and call her back. Message sent to Dr.Hochrein and Avie Arenas RN.

## 2012-05-03 ENCOUNTER — Other Ambulatory Visit: Payer: Self-pay | Admitting: Neurological Surgery

## 2012-05-03 DIAGNOSIS — I6200 Nontraumatic subdural hemorrhage, unspecified: Secondary | ICD-10-CM

## 2012-05-03 NOTE — Telephone Encounter (Signed)
Given this bleed and the fact that her PCI was in May 11 I would prefer that she stay off the Plavix.  She should be taking 81 mg ASA if this was not stopped by neurosurgery.

## 2012-05-03 NOTE — Telephone Encounter (Signed)
Reviewed with pt who states understanding and will continue to take ASA 81 mg daily - she will not take Plavix.

## 2012-05-10 ENCOUNTER — Ambulatory Visit
Admission: RE | Admit: 2012-05-10 | Discharge: 2012-05-10 | Disposition: A | Discharge status: Home / Self Care | Payer: Medicare Other | Source: Ambulatory Visit | Attending: Neurological Surgery | Admitting: Neurological Surgery

## 2012-05-10 DIAGNOSIS — I6200 Nontraumatic subdural hemorrhage, unspecified: Secondary | ICD-10-CM

## 2012-05-11 DIAGNOSIS — S065X9A Traumatic subdural hemorrhage with loss of consciousness of unspecified duration, initial encounter: Secondary | ICD-10-CM | POA: Diagnosis not present

## 2012-05-11 DIAGNOSIS — E785 Hyperlipidemia, unspecified: Secondary | ICD-10-CM | POA: Diagnosis not present

## 2012-05-11 DIAGNOSIS — S065XAA Traumatic subdural hemorrhage with loss of consciousness status unknown, initial encounter: Secondary | ICD-10-CM | POA: Diagnosis not present

## 2012-05-12 ENCOUNTER — Other Ambulatory Visit: Payer: Self-pay | Admitting: Neurological Surgery

## 2012-05-12 DIAGNOSIS — I6200 Nontraumatic subdural hemorrhage, unspecified: Secondary | ICD-10-CM

## 2012-05-24 ENCOUNTER — Ambulatory Visit
Admission: RE | Admit: 2012-05-24 | Discharge: 2012-05-24 | Disposition: A | Discharge status: Home / Self Care | Payer: Medicare Other | Source: Ambulatory Visit | Attending: Neurological Surgery | Admitting: Neurological Surgery

## 2012-05-24 DIAGNOSIS — I6200 Nontraumatic subdural hemorrhage, unspecified: Secondary | ICD-10-CM | POA: Diagnosis not present

## 2012-05-25 ENCOUNTER — Telehealth: Payer: Self-pay | Admitting: Cardiology

## 2012-05-25 NOTE — Telephone Encounter (Signed)
Pt is aware and will restart ASA at 81 mg

## 2012-05-25 NOTE — Telephone Encounter (Signed)
She should restart 81 mg ASA if she has been cleared by neurosurgery to do so.

## 2012-05-25 NOTE — Telephone Encounter (Signed)
Pt was released from Dr. Marikay Alar and patient needs to know if she needs to start her plavix and asa again please call to advise

## 2012-05-25 NOTE — Telephone Encounter (Signed)
Will forward to Dr Hochrein for review and orders 

## 2012-06-02 ENCOUNTER — Other Ambulatory Visit: Payer: Self-pay | Admitting: Cardiology

## 2012-06-07 DIAGNOSIS — H4320 Crystalline deposits in vitreous body, unspecified eye: Secondary | ICD-10-CM | POA: Diagnosis not present

## 2012-06-07 DIAGNOSIS — H04129 Dry eye syndrome of unspecified lacrimal gland: Secondary | ICD-10-CM | POA: Diagnosis not present

## 2012-06-07 DIAGNOSIS — H2510 Age-related nuclear cataract, unspecified eye: Secondary | ICD-10-CM | POA: Diagnosis not present

## 2012-07-04 ENCOUNTER — Other Ambulatory Visit: Payer: Self-pay | Admitting: Cardiology

## 2012-07-04 NOTE — Telephone Encounter (Signed)
..   Requested Prescriptions   Pending Prescriptions Disp Refills  . ZETIA 10 MG PO TABS 30 tablet 5    Sig: TAKE ONE TABLET BY MOUTH EVERY DAY

## 2012-07-21 ENCOUNTER — Encounter: Payer: Self-pay | Admitting: Cardiology

## 2012-07-21 DIAGNOSIS — E785 Hyperlipidemia, unspecified: Secondary | ICD-10-CM | POA: Diagnosis not present

## 2012-07-21 DIAGNOSIS — I259 Chronic ischemic heart disease, unspecified: Secondary | ICD-10-CM | POA: Diagnosis not present

## 2012-07-27 DIAGNOSIS — Z23 Encounter for immunization: Secondary | ICD-10-CM | POA: Diagnosis not present

## 2012-07-28 ENCOUNTER — Encounter: Payer: Self-pay | Admitting: Cardiology

## 2012-07-28 ENCOUNTER — Ambulatory Visit (INDEPENDENT_AMBULATORY_CARE_PROVIDER_SITE_OTHER): Payer: Medicare Other | Admitting: Cardiology

## 2012-07-28 VITALS — BP 117/57 | HR 61 | Ht 65.0 in | Wt 133.8 lb

## 2012-07-28 DIAGNOSIS — I10 Essential (primary) hypertension: Secondary | ICD-10-CM | POA: Diagnosis not present

## 2012-07-28 DIAGNOSIS — I2510 Atherosclerotic heart disease of native coronary artery without angina pectoris: Secondary | ICD-10-CM | POA: Diagnosis not present

## 2012-07-28 DIAGNOSIS — I6200 Nontraumatic subdural hemorrhage, unspecified: Secondary | ICD-10-CM

## 2012-07-28 DIAGNOSIS — R079 Chest pain, unspecified: Secondary | ICD-10-CM | POA: Diagnosis not present

## 2012-07-28 DIAGNOSIS — S065X9A Traumatic subdural hemorrhage with loss of consciousness of unspecified duration, initial encounter: Secondary | ICD-10-CM

## 2012-07-28 NOTE — Patient Instructions (Addendum)
**Note De-identified  Obfuscation** Your physician recommends that you continue on your current medications as directed. Please refer to the Current Medication list given to you today.  Your physician wants you to follow-up in: 1 year. You will receive a reminder letter in the mail two months in advance. If you don't receive a letter, please call our office to schedule the follow-up appointment.  

## 2012-07-28 NOTE — Progress Notes (Signed)
HPI The patient presents for follow up CAD.  Since I last saw her she has done well.  The patient denies any new symptoms such as chest discomfort, neck or arm discomfort. There has been no new shortness of breath, PND or orthopnea. There have been no reported palpitations, presyncope or syncope. She does have muscle aches and this does limit her abilities. She is disappointed because she can't exercise.  Of note since I last saw her she bumped her head and had a subdural hematoma and needed surgery to relieve this. She has since been on Plavix. She has recovered completely from this.  Allergies  Allergen Reactions  . Bactrim (Sulfamethoxazole W-Trimethoprim)     Unknown  . Morphine And Related     nausea  . Prednisone     Unknown  . Sulfonamide Derivatives     Unknown    Current Outpatient Prescriptions  Medication Sig Dispense Refill  . Alpha-D-Galactosidase (BEANO PO) Take by mouth daily. Take 2-3 pills before each meal a day      . aspirin 81 MG tablet Take 81 mg by mouth daily.        . Cholecalciferol (VITAMIN D-3 PO) Take 1 tablet by mouth daily.       . Coenzyme Q10 (CO Q 10 PO) Take 1 capsule by mouth every other day.      . diphenhydramine-acetaminophen (TYLENOL PM) 25-500 MG TABS Take 1 tablet by mouth at bedtime as needed. For insomnia/headache      . fish oil-omega-3 fatty acids 1000 MG capsule Take 1 g by mouth 2 (two) times daily.       Marland Kitchen FLAXSEED, LINSEED, PO Take 1 capsule by mouth daily.       Marland Kitchen losartan (COZAAR) 25 MG tablet TAKE ONE TABLET BY MOUTH EVERY DAY  30 tablet  9  . metoprolol succinate (TOPROL-XL) 25 MG 24 hr tablet Take 75 mg by mouth. 3 po daily      . nitroGLYCERIN (NITROSTAT) 0.4 MG SL tablet Place 0.4 mg under the tongue every 5 (five) minutes as needed. For chest pain      . pantoprazole (PROTONIX) 40 MG tablet Take 40 mg by mouth daily.        . polyethylene glycol (MIRALAX / GLYCOLAX) packet Take 17 g by mouth daily.        . ranitidine  (ZANTAC) 75 MG tablet Take 75 mg by mouth daily.       . rosuvastatin (CRESTOR) 5 MG tablet Take 5 mg by mouth See admin instructions. Take 5 mg daily on weekdays      . ZETIA 10 MG tablet TAKE ONE TABLET BY MOUTH EVERY DAY  30 tablet  5    Past Medical History  Diagnosis Date  . DYSLIPIDEMIA   . Anxiety state, unspecified   . Essential hypertension, benign   . CORONARY ATHEROSCLEROSIS NATIVE CORONARY ARTERY   . CHEST PAIN UNSPECIFIED   . COLONIC POLYPS, HX OF     Past Surgical History  Procedure Date  . Coronary artery bypass graft   . Nasal sinus surgery   . Polypectomy   . Craniotomy 04/27/2012    Procedure: CRANIOTOMY HEMATOMA EVACUATION SUBDURAL;  Surgeon: Tia Alert, MD;  Location: [facility];  Service: Neurosurgery;  Laterality: Right;  Right Frontal Burr hole for Subdural Hematoma    ROS:  As stated in the HPI and negative for all other systems.  PHYSICAL EXAM BP 117/57  Pulse 61  Ht 5\' 5"  (1.651 m)  Wt 60.691 kg (133 lb 12.8 oz)  BMI 22.27 kg/m2 GENERAL:  Well appearing NECK:  No jugular venous distention, waveform within normal limits, carotid upstroke brisk and symmetric, no bruits, no thyromegaly LUNGS:  Clear to auscultation bilaterally BACK:  No CVA tenderness CHEST:  Well healed sternotomy scar. HEART:  PMI not displaced or sustained,S1 and S2 within normal limits, no S3, no S4, no clicks, no rubs, no murmurs ABD:  Flat, positive bowel sounds normal in frequency in pitch, no bruits, no rebound, no guarding, no midline pulsatile mass, no hepatomegaly, no splenomegaly EXT:  2 plus pulses throughout, no edema, no cyanosis no clubbing   EKG:  Sinus rhythm, rate 61, axis within normal limits, intervals within normal limits, no acute ST-T wave changes.  07/28/2012   ASSESSMENT AND PLAN  CORONARY ATHEROSCLEROSIS NATIVE CORONARY ARTERY -  The patient has no new sypmtoms. No further cardiovascular testing is indicated. We will continue with aggressive risk  reduction and meds as listed.   ESSENTIAL HYPERTENSION, BENIGN -  The blood pressure is at target. No change in medications is indicated. We will continue with therapeutic lifestyle changes (TLC).   DYSLIPIDEMIA -  This is going to be followed by Dr. Modesto Charon.  Given her muscle aches it is very reasonable to stop the Crestor. She could be started on pravastatin perhaps 40 mg. However, I will defer to her PCP who is seeing her next week.  SUBDURAL HEMATOMA - She can remain off of the Plavix.

## 2012-08-03 DIAGNOSIS — I2510 Atherosclerotic heart disease of native coronary artery without angina pectoris: Secondary | ICD-10-CM | POA: Diagnosis not present

## 2012-08-03 DIAGNOSIS — E785 Hyperlipidemia, unspecified: Secondary | ICD-10-CM | POA: Diagnosis not present

## 2012-08-15 ENCOUNTER — Other Ambulatory Visit: Payer: Self-pay | Admitting: Cardiology

## 2012-08-15 NOTE — Telephone Encounter (Signed)
..   Requested Prescriptions   Pending Prescriptions Disp Refills  . NITROSTAT 0.4 MG SL tablet [Pharmacy Med Name: NITROSTAT 0.4MG      SUB] 25 tablet 6    Sig: DISSOLVE ONE TABLET AS NEEDED FOR CHEST PAIN AS DIRECTED

## 2012-09-05 DIAGNOSIS — N281 Cyst of kidney, acquired: Secondary | ICD-10-CM | POA: Diagnosis not present

## 2012-09-24 ENCOUNTER — Encounter (HOSPITAL_COMMUNITY): Payer: Self-pay | Admitting: *Deleted

## 2012-09-24 DIAGNOSIS — I2510 Atherosclerotic heart disease of native coronary artery without angina pectoris: Secondary | ICD-10-CM | POA: Diagnosis not present

## 2012-09-24 DIAGNOSIS — Z7982 Long term (current) use of aspirin: Secondary | ICD-10-CM | POA: Diagnosis not present

## 2012-09-24 DIAGNOSIS — S01409A Unspecified open wound of unspecified cheek and temporomandibular area, initial encounter: Secondary | ICD-10-CM | POA: Insufficient documentation

## 2012-09-24 DIAGNOSIS — S0180XA Unspecified open wound of other part of head, initial encounter: Secondary | ICD-10-CM | POA: Insufficient documentation

## 2012-09-24 DIAGNOSIS — Z860100 Personal history of colon polyps, unspecified: Secondary | ICD-10-CM | POA: Insufficient documentation

## 2012-09-24 DIAGNOSIS — E785 Hyperlipidemia, unspecified: Secondary | ICD-10-CM | POA: Insufficient documentation

## 2012-09-24 DIAGNOSIS — F411 Generalized anxiety disorder: Secondary | ICD-10-CM | POA: Insufficient documentation

## 2012-09-24 DIAGNOSIS — Z951 Presence of aortocoronary bypass graft: Secondary | ICD-10-CM | POA: Diagnosis not present

## 2012-09-24 DIAGNOSIS — Z79899 Other long term (current) drug therapy: Secondary | ICD-10-CM | POA: Diagnosis not present

## 2012-09-24 DIAGNOSIS — W540XXA Bitten by dog, initial encounter: Secondary | ICD-10-CM | POA: Insufficient documentation

## 2012-09-24 DIAGNOSIS — I10 Essential (primary) hypertension: Secondary | ICD-10-CM | POA: Diagnosis not present

## 2012-09-24 DIAGNOSIS — Y9389 Activity, other specified: Secondary | ICD-10-CM | POA: Insufficient documentation

## 2012-09-24 DIAGNOSIS — Y9289 Other specified places as the place of occurrence of the external cause: Secondary | ICD-10-CM | POA: Insufficient documentation

## 2012-09-24 NOTE — ED Notes (Signed)
Pt was attacked by her dog in the face, completely unprevoked.  Dog is up-to-date on shots.  Lac to corner of R lip and hematoma with skin breakage to L forearm.  Last tetanus 5 years ago.

## 2012-09-25 ENCOUNTER — Emergency Department (HOSPITAL_COMMUNITY)
Admission: EM | Admit: 2012-09-25 | Discharge: 2012-09-25 | Disposition: A | Discharge status: Home / Self Care | Payer: Medicare Other | Attending: Emergency Medicine | Admitting: Emergency Medicine

## 2012-09-25 DIAGNOSIS — IMO0002 Reserved for concepts with insufficient information to code with codable children: Secondary | ICD-10-CM

## 2012-09-25 DIAGNOSIS — S01459A Open bite of unspecified cheek and temporomandibular area, initial encounter: Secondary | ICD-10-CM

## 2012-09-25 MED ORDER — BACITRACIN-NEOMYCIN-POLYMYXIN OINTMENT TUBE
TOPICAL_OINTMENT | Freq: Once | CUTANEOUS | Status: AC
  Administered 2012-09-25: 01:00:00 via TOPICAL
  Filled 2012-09-25: qty 15

## 2012-09-25 NOTE — ED Provider Notes (Signed)
History     CSN: [id number]  Arrival date & time 09/24/12  2302   First MD Initiated Contact with Patient 09/25/12 0019      Chief Complaint  Patient presents with  . Animal Bite    (Consider location/radiation/quality/duration/timing/severity/associated sxs/prior treatment) HPI Comments: Patient was With her dog in bed.  When he turned and bit her on the face, cutting a laceration to the corner of the mouth and has a page to the anterior portion of her left forearm.  Patient.  Tetanus is up-to-date.  The dog's immunizations are, up-to-date  Patient is a 66 y.o. female presenting with animal bite. The history is provided by the patient.  Animal Bite  The incident occurred just prior to arrival. There is an injury to the face. There is an injury to the left forearm.    Past Medical History  Diagnosis Date  . DYSLIPIDEMIA   . Anxiety state, unspecified   . Essential hypertension, benign   . CORONARY ATHEROSCLEROSIS NATIVE CORONARY ARTERY   . CHEST PAIN UNSPECIFIED   . COLONIC POLYPS, HX OF     Past Surgical History  Procedure Date  . Coronary artery bypass graft   . Nasal sinus surgery   . Polypectomy   . Craniotomy 04/27/2012    Procedure: CRANIOTOMY HEMATOMA EVACUATION SUBDURAL;  Surgeon: Tia Alert, MD;  Location: [facility];  Service: Neurosurgery;  Laterality: Right;  Right Frontal Burr hole for Subdural Hematoma    No family history on file.  History  Substance Use Topics  . Smoking status: Never Smoker   . Smokeless tobacco: Not on file  . Alcohol Use: No    OB History    Grav Para Term Preterm Abortions TAB SAB Ect Mult Living                  Review of Systems  Constitutional: Negative for fever.  HENT: Negative for facial swelling.   Respiratory: Negative for wheezing.   Skin: Positive for wound.  Neurological: Negative for dizziness.    Allergies  Bactrim; Morphine and related; Prednisone; and Sulfonamide derivatives  Home Medications     Current Outpatient Rx  Name  Route  Sig  Dispense  Refill  . BEANO PO   Oral   Take 2-3 tablets by mouth daily. Take 2-3 pills before each meal a day         . ASPIRIN 81 MG PO TABS   Oral   Take 81 mg by mouth daily.          Marland Kitchen VITAMIN D-3 PO   Oral   Take 1 tablet by mouth daily.          . CO Q 10 PO   Oral   Take 1 capsule by mouth every other day.         Marland Kitchen DIPHENHYDRAMINE HCL 25 MG PO TABS   Oral   Take 25 mg by mouth at bedtime as needed. For sleep         . DIPHENHYDRAMINE-APAP (SLEEP) 25-500 MG PO TABS   Oral   Take 1 tablet by mouth at bedtime as needed. For insomnia/headache         . EZETIMIBE 10 MG PO TABS   Oral   Take 10 mg by mouth daily.         . OMEGA-3 FATTY ACIDS 1000 MG PO CAPS   Oral   Take 1 g by mouth 2 (two) times daily.          Marland Kitchen  FLAXSEED (LINSEED) PO   Oral   Take 1 capsule by mouth daily.          Marland Kitchen LOSARTAN POTASSIUM 25 MG PO TABS   Oral   Take 25 mg by mouth daily.         Marland Kitchen METOPROLOL SUCCINATE ER 25 MG PO TB24   Oral   Take 75 mg by mouth daily. 3 po daily         . NITROGLYCERIN 0.4 MG SL SUBL   Sublingual   Place 0.4 mg under the tongue every 5 (five) minutes as needed. For chest pain         . PANTOPRAZOLE SODIUM 40 MG PO TBEC   Oral   Take 40 mg by mouth daily.          Marland Kitchen PANTOPRAZOLE SODIUM 40 MG PO TBEC   Oral   Take 40 mg by mouth daily.         Marland Kitchen POLYETHYLENE GLYCOL 3350 PO PACK   Oral   Take 17 g by mouth daily.            BP 150/75  Pulse 50  Temp 97.5 F (36.4 C) (Oral)  Resp 18  SpO2 100%  Physical Exam  Constitutional: She appears well-developed and well-nourished.  HENT:  Head: Normocephalic. Head is with laceration.    Eyes: Pupils are equal, round, and reactive to light.  Neck: Normal range of motion.  Cardiovascular: Normal rate.   Pulmonary/Chest: Effort normal.  Musculoskeletal: Normal range of motion.  Skin: Skin is warm.       Laceration to corner  of mouth R side  Pinch/abrasion to L FA     ED Course  LACERATION REPAIR Date/Time: 09/25/2012 1:02 AM Performed by: Arman Filter Authorized by: Arman Filter Consent: Verbal consent obtained. Risks and benefits: risks, benefits and alternatives were discussed Consent given by: patient Patient understanding: patient states understanding of the procedure being performed Patient identity confirmed: verbally with patient Time out: Immediately prior to procedure a "time out" was called to verify the correct patient, procedure, equipment, support staff and site/side marked as required. Body area: head/neck Location details: chin Laceration length: 1 cm Foreign bodies: no foreign bodies Tendon involvement: none Nerve involvement: none Vascular damage: no Anesthesia: local infiltration Local anesthetic: lidocaine 1% without epinephrine Anesthetic total: 1.5 ml Patient sedated: no Irrigation solution: saline Skin closure: 6-0 Prolene Mucous membrane closure: 4-0 Chromic gut Number of sutures: 5 Technique: simple Approximation: close Approximation difficulty: simple Dressing: antibiotic ointment Comments: 1 suture placed in mucous membrane 4 sutures placced in facial skin    (including critical care time)  Labs Reviewed - No data to display No results found.   No diagnosis found.    MDM   Facial and intraoral laceration, repaired        Arman Filter, NP 09/25/12 [phone]

## 2012-09-25 NOTE — ED Notes (Signed)
Pt st's her dog bit her in the face.  Pt has lac to right upper lip.

## 2012-09-25 NOTE — ED Provider Notes (Signed)
Medical screening examination/treatment/procedure(s) were performed by non-physician practitioner and as supervising physician I was immediately available for consultation/collaboration.   Belvie Iribe W. Delaine Hernandez, MD 09/25/12 1749 

## 2012-09-29 DIAGNOSIS — Z4802 Encounter for removal of sutures: Secondary | ICD-10-CM | POA: Diagnosis not present

## 2012-10-27 DIAGNOSIS — I10 Essential (primary) hypertension: Secondary | ICD-10-CM | POA: Diagnosis not present

## 2012-10-27 DIAGNOSIS — Z79899 Other long term (current) drug therapy: Secondary | ICD-10-CM | POA: Diagnosis not present

## 2012-10-27 DIAGNOSIS — E785 Hyperlipidemia, unspecified: Secondary | ICD-10-CM | POA: Diagnosis not present

## 2012-11-02 DIAGNOSIS — E785 Hyperlipidemia, unspecified: Secondary | ICD-10-CM | POA: Diagnosis not present

## 2012-11-02 DIAGNOSIS — M12569 Traumatic arthropathy, unspecified knee: Secondary | ICD-10-CM | POA: Diagnosis not present

## 2012-12-20 ENCOUNTER — Other Ambulatory Visit: Payer: Self-pay | Admitting: Cardiology

## 2012-12-21 ENCOUNTER — Other Ambulatory Visit: Payer: Self-pay

## 2012-12-21 MED ORDER — EZETIMIBE 10 MG PO TABS: 10.0000 mg | ORAL_TABLET | Freq: Every day | ORAL | Status: DC

## 2012-12-21 NOTE — Telephone Encounter (Signed)
..   Requested Prescriptions   Signed Prescriptions Disp Refills  . ezetimibe (ZETIA) 10 MG tablet 30 tablet 6    Sig: Take 1 tablet (10 mg total) by mouth daily.    Authorizing Sagan Wurzel: Rollene Rotunda    Ordering User: Christella Hartigan, ROSE Judie Petit

## 2013-01-19 ENCOUNTER — Other Ambulatory Visit: Payer: Self-pay | Admitting: Family Medicine

## 2013-01-23 ENCOUNTER — Telehealth: Payer: Self-pay | Admitting: Family Medicine

## 2013-01-24 MED ORDER — PRAVASTATIN SODIUM 40 MG PO TABS: 40.0000 mg | ORAL_TABLET | Freq: Every day | ORAL | Status: DC

## 2013-01-24 NOTE — Telephone Encounter (Signed)
rx refilled.

## 2013-02-13 ENCOUNTER — Other Ambulatory Visit: Payer: Self-pay

## 2013-02-13 DIAGNOSIS — Z1231 Encounter for screening mammogram for malignant neoplasm of breast: Secondary | ICD-10-CM

## 2013-02-20 ENCOUNTER — Other Ambulatory Visit (INDEPENDENT_AMBULATORY_CARE_PROVIDER_SITE_OTHER): Payer: Medicare Other

## 2013-02-20 DIAGNOSIS — Z8679 Personal history of other diseases of the circulatory system: Secondary | ICD-10-CM | POA: Diagnosis not present

## 2013-02-20 DIAGNOSIS — Z0000 Encounter for general adult medical examination without abnormal findings: Secondary | ICD-10-CM

## 2013-02-20 DIAGNOSIS — I25810 Atherosclerosis of coronary artery bypass graft(s) without angina pectoris: Secondary | ICD-10-CM | POA: Diagnosis not present

## 2013-02-20 LAB — HEMOGLOBIN A1C: Hgb A1c MFr Bld: 5.8 % — ABNORMAL HIGH (ref <5.7)

## 2013-02-20 LAB — LIPID PANEL
HDL: 41 mg/dL (ref >39)
LDL Cholesterol: 64 mg/dL (ref 0–99)
Total CHOL/HDL Ratio: 3.4 Ratio

## 2013-02-20 LAB — COMPLETE METABOLIC PANEL WITH GFR
ALT: 25 U/L (ref 0–35)
Alkaline Phosphatase: 67 U/L (ref 39–117)
Sodium: 143 mEq/L (ref 135–145)
Total Bilirubin: 0.5 mg/dL (ref 0.3–1.2)
Total Protein: 7 g/dL (ref 6.0–8.3)

## 2013-02-20 LAB — CBC WITH DIFFERENTIAL/PLATELET
Basophils Relative: 1 % (ref 0–1)
Eosinophils Absolute: 0.2 10*3/uL (ref 0.0–0.7)
HCT: 39.9 % (ref 36.0–46.0)
Hemoglobin: 13.6 g/dL (ref 12.0–15.0)
MCH: 30.8 pg (ref 26.0–34.0)
MCHC: 34.1 g/dL (ref 30.0–36.0)
Monocytes Absolute: 0.4 10*3/uL (ref 0.1–1.0)
Monocytes Relative: 8 % (ref 3–12)
Neutro Abs: 3 10*3/uL (ref 1.7–7.7)

## 2013-02-27 ENCOUNTER — Ambulatory Visit (INDEPENDENT_AMBULATORY_CARE_PROVIDER_SITE_OTHER): Payer: Medicare Other | Admitting: Family Medicine

## 2013-02-27 ENCOUNTER — Encounter: Payer: Self-pay | Admitting: Family Medicine

## 2013-02-27 VITALS — BP 98/56 | HR 46 | Temp 97.6°F | Resp 12 | Wt 131.0 lb

## 2013-02-27 DIAGNOSIS — I2510 Atherosclerotic heart disease of native coronary artery without angina pectoris: Secondary | ICD-10-CM | POA: Diagnosis not present

## 2013-02-27 DIAGNOSIS — I10 Essential (primary) hypertension: Secondary | ICD-10-CM | POA: Diagnosis not present

## 2013-02-27 MED ORDER — PANTOPRAZOLE SODIUM 40 MG PO TBEC: 40.0000 mg | DELAYED_RELEASE_TABLET | Freq: Every day | ORAL | Status: DC

## 2013-02-27 NOTE — Progress Notes (Signed)
Subjective:    Patient ID: Nancy [name], female    DOB: 05/04/1946, 67 y.o.   MRN: [id number]  HPI Patient is here for followup of her hypertension and her hyperlipidemia with regards to her coronary artery disease.  Her blood pressure today is low at 98/56. Furthermore she has had symptomatic bradycardia with lightheadedness and dizziness and fatigue. Her today is 89. She's currently taking Toprol-XL 75 mg by mouth daily.  She is also on Losartan 25 mg by Mouth Daily.  She denies chest pain or shortness of breath.  She also takes pravastatin 40 mg by mouth daily for hyperlipidemia. I reviewed with her her fasting lipid panel. Her LDL is well below 70. Her HDL is greater than 40. Your triglycerides are acceptable. She does report myalgias on statin therapy. She also complains of heartburn that is not controlled by Zantac Past Medical History  Diagnosis Date  . DYSLIPIDEMIA   . Anxiety state, unspecified   . Essential hypertension, benign   . CORONARY ATHEROSCLEROSIS NATIVE CORONARY ARTERY   . CHEST PAIN UNSPECIFIED   . COLONIC POLYPS, HX OF    Current Outpatient Prescriptions on File Prior to Visit  Medication Sig Dispense Refill  . Alpha-D-Galactosidase (BEANO PO) Take 2-3 tablets by mouth daily. Take 2-3 pills before each meal a day      . aspirin 81 MG tablet Take 81 mg by mouth daily.       . Cholecalciferol (VITAMIN D-3 PO) Take 1 tablet by mouth daily.       . Coenzyme Q10 (CO Q 10 PO) Take 1 capsule by mouth every other day.      . diphenhydrAMINE (BENADRYL) 25 MG tablet Take 25 mg by mouth at bedtime as needed. For sleep      . diphenhydramine-acetaminophen (TYLENOL PM) 25-500 MG TABS Take 1 tablet by mouth at bedtime as needed. For insomnia/headache      . ezetimibe (ZETIA) 10 MG tablet Take 1 tablet (10 mg total) by mouth daily.  30 tablet  6  . fish oil-omega-3 fatty acids 1000 MG capsule Take 1 g by mouth 2 (two) times daily.       Marland Kitchen FLAXSEED, LINSEED, PO Take 1 capsule by  mouth daily.       Marland Kitchen losartan (COZAAR) 25 MG tablet Take 25 mg by mouth daily.      . metoprolol succinate (TOPROL-XL) 25 MG 24 hr tablet Take 75 mg by mouth daily. 3 po daily      . nitroGLYCERIN (NITROSTAT) 0.4 MG SL tablet Place 0.4 mg under the tongue every 5 (five) minutes as needed. For chest pain      . polyethylene glycol (MIRALAX / GLYCOLAX) packet Take 17 g by mouth daily.       . pravastatin (PRAVACHOL) 40 MG tablet Take 1 tablet (40 mg total) by mouth at bedtime.  30 tablet  5   No current facility-administered medications on file prior to visit.   Allergies  Allergen Reactions  . Bactrim (Sulfamethoxazole W-Trimethoprim) Other (See Comments)    Blisters/skin peeling  . Morphine And Related     nausea  . Prednisone     Unknown  . Sulfonamide Derivatives Other (See Comments)    Blisters/skin peeling      Review of Systems    remainder of review of systems is negative Objective:   Physical Exam  Constitutional: She appears well-developed and well-nourished.  Neck: No JVD present. No thyromegaly present.  Cardiovascular: Normal rate,  regular rhythm and normal heart sounds.  Exam reveals no friction rub.   No murmur heard. Pulmonary/Chest: Effort normal and breath sounds normal. No respiratory distress. She has no wheezes. She has no rales. She exhibits no tenderness.  Abdominal: Soft. Bowel sounds are normal. She exhibits no distension. There is no tenderness. There is no rebound and no guarding.  Lymphadenopathy:    She has no cervical adenopathy.          Assessment & Plan:  1. Essential hypertension, benign I have asked her to decrease Toprol-XL to 25 mg by mouth daily. We will review her blood pressure and her pulse rate in 2 weeks.  2. CORONARY ATHEROSCLEROSIS NATIVE CORONARY ARTERY Her LDL is at his goal of less than 70. Advised her to continue pravastatin 40 mg by mouth daily. Did recommend she discontinue Zantac and try protonix 40 mg by mouth daily for  heartburn.

## 2013-03-09 ENCOUNTER — Ambulatory Visit
Admission: RE | Admit: 2013-03-09 | Discharge: 2013-03-09 | Disposition: A | Discharge status: Home / Self Care | Payer: Medicare Other | Source: Ambulatory Visit

## 2013-03-09 DIAGNOSIS — Z1231 Encounter for screening mammogram for malignant neoplasm of breast: Secondary | ICD-10-CM

## 2013-03-20 ENCOUNTER — Other Ambulatory Visit: Payer: Self-pay | Admitting: Family Medicine

## 2013-03-20 ENCOUNTER — Other Ambulatory Visit: Payer: Self-pay | Admitting: Cardiology

## 2013-03-22 NOTE — Telephone Encounter (Signed)
..   Requested Prescriptions   Pending Prescriptions Disp Refills  . losartan (COZAAR) 25 MG tablet [Pharmacy Med Name: LOSARTAN 25MG    TAB] 30 tablet 6    Sig: TAKE ONE TABLET BY MOUTH EVERY DAY

## 2013-04-05 IMAGING — CT CT HEAD W/O CM
2 series · 16 of 30 positions shown, 18 images · non-contrast
Comparison: Head CT 05/02/2012

CLINICAL DATA: Follow-up subdural hemorrhage.

CT HEAD WITHOUT CONTRAST
TECHNIQUE: Contiguous axial images were obtained from the base of
the skull through the vertex without contrast.

[Series 2: head w/o · axial · non-contrast · 0.49mm/px · z∈[+24,+131]mm · 8 of 28 slices shown, 10 images]
[im 4/28  brain]
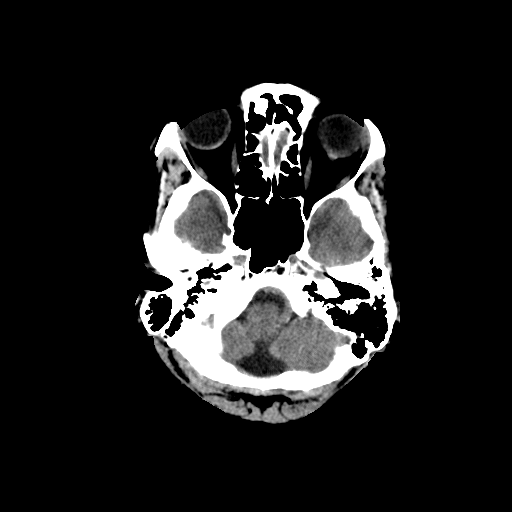
[im 4/28  bone]
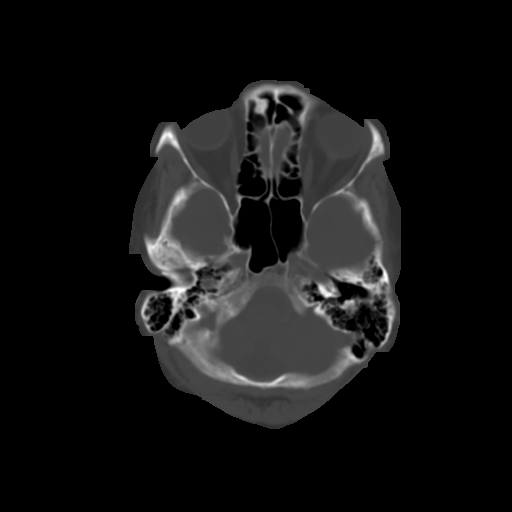
[im 7/28  brain]
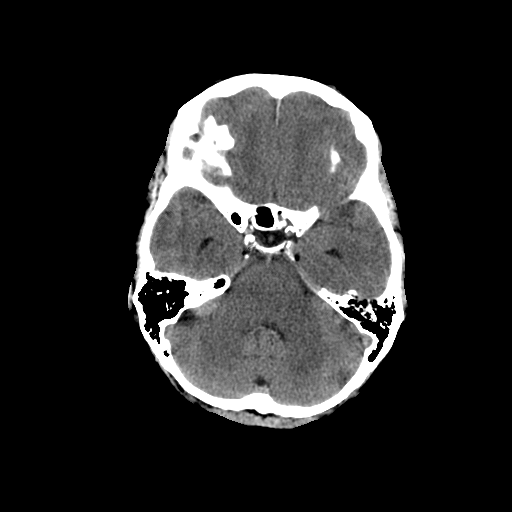
[im 10/28  brain]
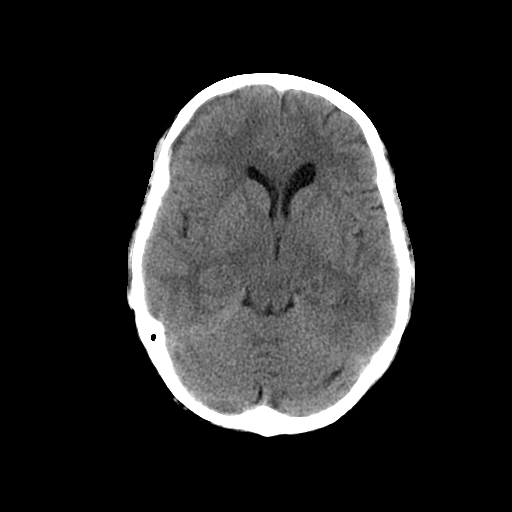
[im 13/28  brain]
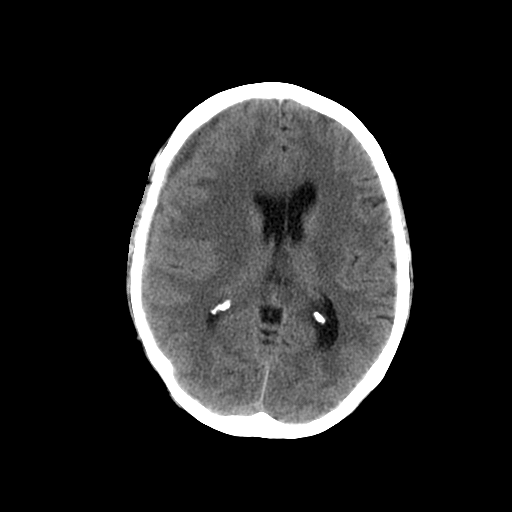
[im 16/28  brain]
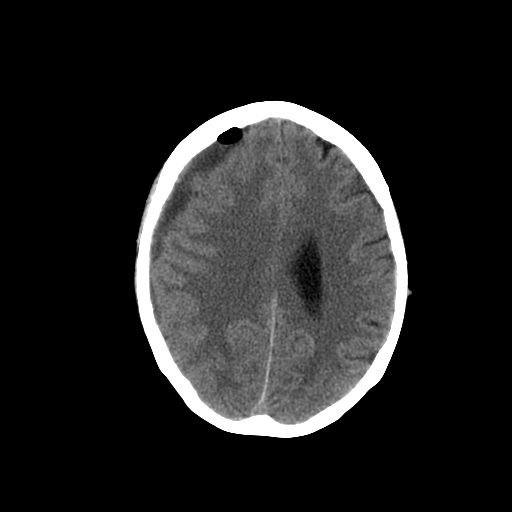
[im 16/28  bone]
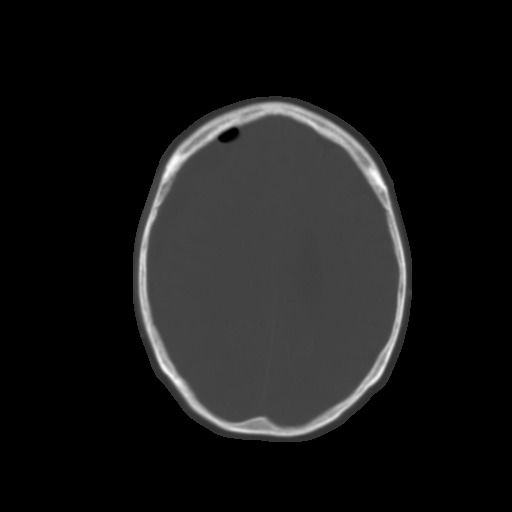
[im 19/28  brain]
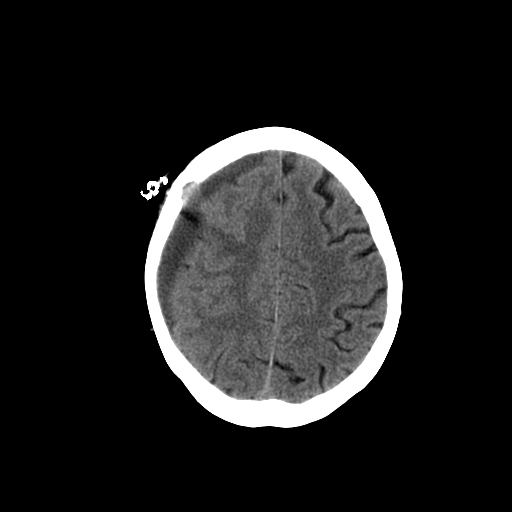
[im 22/28  brain]
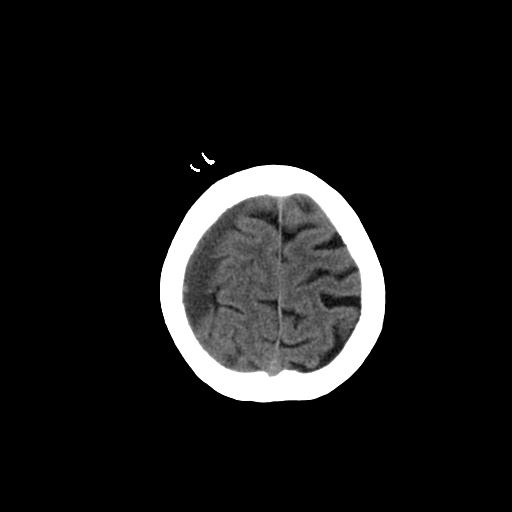
[im 25/28  brain]
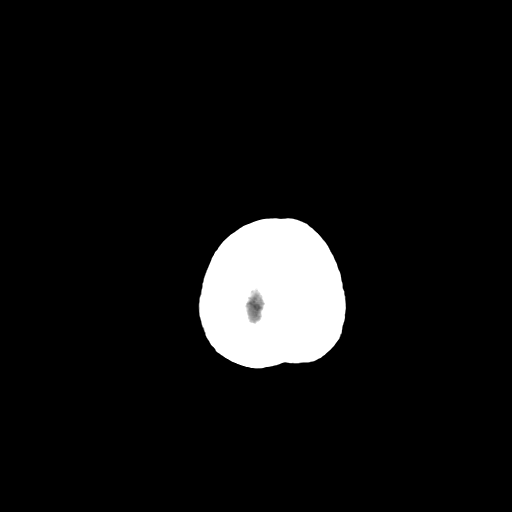

[Series 3: head bone · axial · 0.49mm/px · z∈[+20,+132]mm · 8 of 56 slices shown]
[im 6/56  bone]
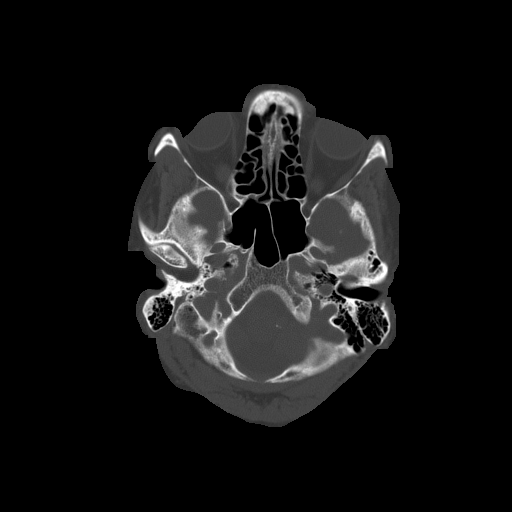
[im 12/56  bone]
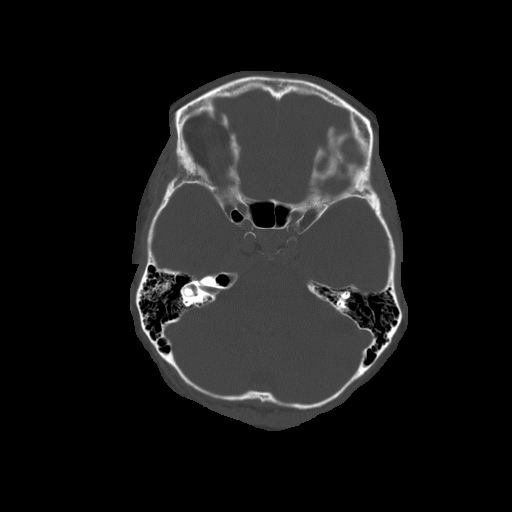
[im 18/56  bone]
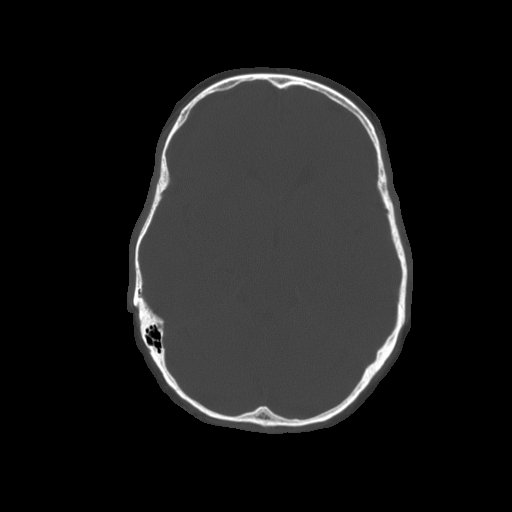
[im 24/56  bone]
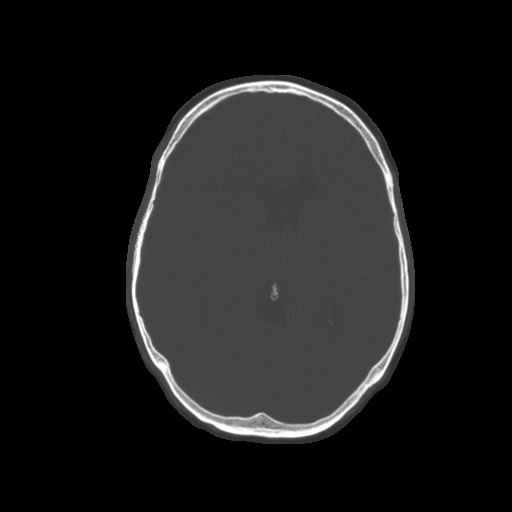
[im 32/56  bone]
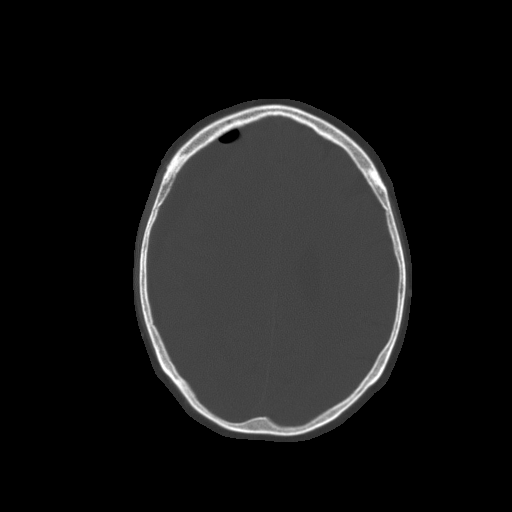
[im 38/56  bone]
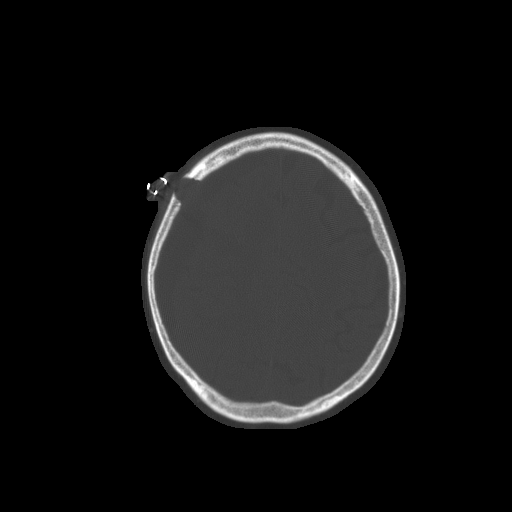
[im 44/56  bone]
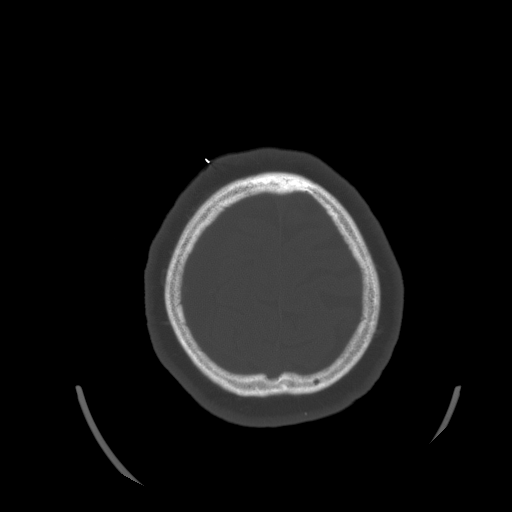
[im 50/56  bone]
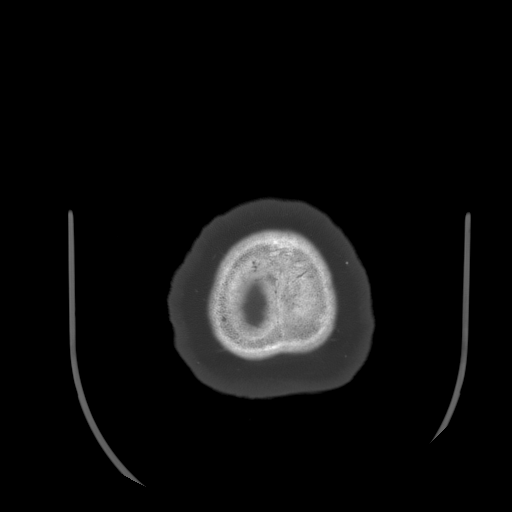

[16 of 30 positions shown; findings below may reference images not displayed]

FINDINGS: There is again demonstrated a relatively low density
right extra-axial fluid collection over the right cerebral
convexity which is decreased in volume paired to prior.  For
example,  at the level  of the right burr hole,  the extra-axial
collection measures 11 mm (image 19)  compared to 13 mm on prior.
There is no new hemorrhage on the right.  The volume of
pneumocephalus is decreased anteriorly.

Likewise, the degree of right to left midline shift is decreased
measuring 6 mm compared to 8 mm on prior.

There is no new intracranial hemorrhage.  No hydrocephalus.
Basilar cisterns are patent.  No acute cortical infarction.
IMPRESSION: 1..  Persistent but decreased in volume  right subdural hematoma.

2.  Persistent but decreased right to left midline shift.
3.  No new hemorrhage.

## 2013-04-18 ENCOUNTER — Other Ambulatory Visit: Payer: Self-pay | Admitting: Family Medicine

## 2013-04-28 ENCOUNTER — Ambulatory Visit (INDEPENDENT_AMBULATORY_CARE_PROVIDER_SITE_OTHER): Payer: Medicare Other | Admitting: Family Medicine

## 2013-04-28 ENCOUNTER — Encounter: Payer: Self-pay | Admitting: Family Medicine

## 2013-04-28 VITALS — BP 110/70 | HR 56 | Temp 98.0°F | Resp 14 | Wt 131.0 lb

## 2013-04-28 DIAGNOSIS — R002 Palpitations: Secondary | ICD-10-CM

## 2013-04-28 DIAGNOSIS — I2510 Atherosclerotic heart disease of native coronary artery without angina pectoris: Secondary | ICD-10-CM | POA: Diagnosis not present

## 2013-04-28 DIAGNOSIS — R079 Chest pain, unspecified: Secondary | ICD-10-CM | POA: Diagnosis not present

## 2013-04-28 DIAGNOSIS — IMO0001 Reserved for inherently not codable concepts without codable children: Secondary | ICD-10-CM | POA: Diagnosis not present

## 2013-04-28 LAB — COMPLETE METABOLIC PANEL WITH GFR
ALT: 24 U/L (ref 0–35)
BUN: 16 mg/dL (ref 6–23)
CO2: 29 mEq/L (ref 19–32)
Creat: 0.93 mg/dL (ref 0.50–1.10)
GFR, Est African American: 74 mL/min
GFR, Est Non African American: 64 mL/min
Glucose, Bld: 86 mg/dL (ref 70–99)
Total Bilirubin: 0.6 mg/dL (ref 0.3–1.2)

## 2013-04-28 LAB — CBC WITH DIFFERENTIAL/PLATELET
Eosinophils Absolute: 0.1 10*3/uL (ref 0.0–0.7)
Eosinophils Relative: 2 % (ref 0–5)
Lymphs Abs: 1.8 10*3/uL (ref 0.7–4.0)
MCH: 30.6 pg (ref 26.0–34.0)
MCV: 89.4 fL (ref 78.0–100.0)
Monocytes Absolute: 0.5 10*3/uL (ref 0.1–1.0)
Platelets: 222 10*3/uL (ref 150–400)
RBC: 4.44 MIL/uL (ref 3.87–5.11)

## 2013-04-28 LAB — LIPID PANEL
Cholesterol: 137 mg/dL (ref 0–200)
Triglycerides: 115 mg/dL (ref <150)

## 2013-04-28 LAB — HEMOGLOBIN A1C: Mean Plasma Glucose: 114 mg/dL (ref <117)

## 2013-04-28 LAB — TSH: TSH: 2.65 u[IU]/mL (ref 0.350–4.500)

## 2013-04-28 MED ORDER — KETOCONAZOLE 2 % EX SHAM: MEDICATED_SHAMPOO | CUTANEOUS | Status: DC

## 2013-04-28 NOTE — Progress Notes (Signed)
Subjective:    Patient ID: Nancy [name], female    DOB: [birth year], 67 y.o.   MRN: [id number]  HPI Patient presents today with several medical concerns. She complains of dry cracked and painful skin inside her nostrils.  She takes Benadryl every night to help her sleep. She denies any sinus pain or congestion.  She also complains of palpitations in the chest.  These occur episodically and last a few seconds. Associate with palpitations he sometimes feels a chest tightness like a cramp.  It is not related to exertion. She has had it more regularly after meals.  She denies shortness of breath or dyspnea on exertion. The pain can last minutes.  She has a significant history of coronary artery disease. Her last stress test was in 2012. She is compliant with her aspirin.  She denies any true angina.  She is also reporting dry brittle nails and thinning hair. Past Medical History  Diagnosis Date  . DYSLIPIDEMIA   . Anxiety state, unspecified   . Essential hypertension, benign   . CORONARY ATHEROSCLEROSIS NATIVE CORONARY ARTERY   . CHEST PAIN UNSPECIFIED   . COLONIC POLYPS, HX OF    Current Outpatient Prescriptions on File Prior to Visit  Medication Sig Dispense Refill  . Alpha-D-Galactosidase (BEANO PO) Take 2-3 tablets by mouth daily. Take 2-3 pills before each meal a day      . aspirin 81 MG tablet Take 81 mg by mouth daily.       . Cholecalciferol (VITAMIN D-3 PO) Take 1 tablet by mouth daily.       . Coenzyme Q10 (CO Q 10 PO) Take 1 capsule by mouth every other day.      . diphenhydrAMINE (BENADRYL) 25 MG tablet Take 25 mg by mouth at bedtime as needed. For sleep      . diphenhydramine-acetaminophen (TYLENOL PM) 25-500 MG TABS Take 1 tablet by mouth at bedtime as needed. For insomnia/headache      . ezetimibe (ZETIA) 10 MG tablet Take 1 tablet (10 mg total) by mouth daily.  30 tablet  6  . fish oil-omega-3 fatty acids 1000 MG capsule Take 1 g by mouth 2 (two) times daily.       Marland Kitchen FLAXSEED,  LINSEED, PO Take 1 capsule by mouth daily.       Marland Kitchen losartan (COZAAR) 25 MG tablet TAKE ONE TABLET BY MOUTH EVERY DAY  30 tablet  6  . metoprolol succinate (TOPROL-XL) 25 MG 24 hr tablet TAKE THREE TABLETS BY MOUTH ONCE DAILY  90 tablet  0  . nitroGLYCERIN (NITROSTAT) 0.4 MG SL tablet Place 0.4 mg under the tongue every 5 (five) minutes as needed. For chest pain      . pantoprazole (PROTONIX) 40 MG tablet Take 1 tablet (40 mg total) by mouth daily.  30 tablet  11  . polyethylene glycol (MIRALAX / GLYCOLAX) packet Take 17 g by mouth daily.       . pravastatin (PRAVACHOL) 40 MG tablet Take 1 tablet (40 mg total) by mouth at bedtime.  30 tablet  5   No current facility-administered medications on file prior to visit.   Past Surgical History  Procedure Laterality Date  . Coronary artery bypass graft    . Nasal sinus surgery    . Polypectomy    . 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 Marital Status: Married    Spouse Name: N/A    Number of Children: N/A  . Years of Education: N/A   Occupational History  . Not on file.   Social History Main Topics  . Smoking status: Never Smoker   . Smokeless tobacco: Not on file  . Alcohol Use: No  . Drug Use: No  . Sexually Active: Not on file   Other Topics Concern  . Not on file   Social History Narrative  . No narrative on file   No family history on file.    Review of Systems  All other systems reviewed and are negative.       Objective:   Physical Exam  Vitals reviewed. Constitutional: She appears well-developed and well-nourished.  HENT:  Nose: Nose normal.  Mouth/Throat: No oropharyngeal exudate.  Eyes: Conjunctivae are normal. Pupils are equal, round, and reactive to light.  Neck: Neck supple. No JVD present.  No thyromegaly present.  Cardiovascular: Normal rate, regular rhythm and normal heart sounds.  Exam reveals no gallop.   No murmur heard. Pulmonary/Chest: Effort normal and breath sounds normal. No respiratory distress. She has no wheezes. She has no rales. She exhibits no tenderness.  Abdominal: Soft. Bowel sounds are normal. She exhibits no distension and no mass. There is no tenderness. There is no rebound and no guarding.  Lymphadenopathy:    She has no cervical adenopathy.          Assessment & Plan:  1. Coronary atherosclerosis of native coronary artery EKG today in the office shows normal sinus rhythm at 59 beats per minute with normal intervals and normal axis there is possible left atrial enlargement. Otherwise there is no evidence of ischemia or infarction on EKG. - Lipid panel - Ambulatory referral to Cardiology  2. Type II or unspecified type diabetes mellitus without mention of complication, uncontrolled Check hemoglobin A1c - Hemoglobin A1c  3. Palpitations I believe the patient is mainly having PVCs. I will check a TSH - CBC with Differential - COMPLETE METABOLIC PANEL WITH GFR - TSH  4. Chest pain EKG today is normal. The pain may be gastrointestinal in origin however I will recommend a cardiology consult for possible stress test given her previous history of coronary artery disease.  If the stress test is normal I will pursue possibly an EGD to evaluate other gastrointestinal causes. - EKG 12-Lead - Ambulatory referral to Cardiology

## 2013-04-28 NOTE — Addendum Note (Signed)
Addended by: Lynnea Ferrier on: 04/28/2013 09:34 AM   Modules accepted: Orders

## 2013-05-11 ENCOUNTER — Ambulatory Visit (INDEPENDENT_AMBULATORY_CARE_PROVIDER_SITE_OTHER): Payer: Medicare Other | Admitting: Cardiology

## 2013-05-11 ENCOUNTER — Encounter: Payer: Self-pay | Admitting: Cardiology

## 2013-05-11 VITALS — BP 115/50 | HR 88 | Ht 65.0 in | Wt 130.0 lb

## 2013-05-11 DIAGNOSIS — I2510 Atherosclerotic heart disease of native coronary artery without angina pectoris: Secondary | ICD-10-CM

## 2013-05-11 DIAGNOSIS — I10 Essential (primary) hypertension: Secondary | ICD-10-CM | POA: Diagnosis not present

## 2013-05-11 NOTE — Progress Notes (Signed)
HPI The patient presents for follow up CAD.  She was actually referred back by her primary care doctor to consider another stress test. She continues to have some aching under her left breast periodically. This happens for seconds at a time. This is actually unchanged from previous pain that she had. She said she was having this at the time of her last stress test in 2012. She has occasional skipping heart beats when this happens but she's not had any presyncope or syncope. She's not had any PND or orthopnea. She's had no weight gain or edema. The most activity she is doing currently is walking the dog.  Allergies  Allergen Reactions  . Bactrim (Sulfamethoxazole W-Trimethoprim) Other (See Comments)    Blisters/skin peeling  . Morphine And Related     nausea  . Prednisone     Unknown  . Sulfonamide Derivatives Other (See Comments)    Blisters/skin peeling    Current Outpatient Prescriptions  Medication Sig Dispense Refill  . Alpha-D-Galactosidase (BEANO PO) Take 2-3 tablets by mouth daily. Take 2-3 pills before each meal a day      . aspirin 81 MG tablet Take 81 mg by mouth daily.       Marland Kitchen BIOTIN 5000 PO Take 5,000 mcg by mouth daily.      . Cholecalciferol (VITAMIN D-3 PO) Take 1 tablet by mouth daily.       . Coenzyme Q10 (CO Q 10 PO) Take 1 capsule by mouth every other day.      . diphenhydrAMINE (BENADRYL) 25 MG tablet Take 25 mg by mouth at bedtime as needed. For sleep      . diphenhydramine-acetaminophen (TYLENOL PM) 25-500 MG TABS Take 1 tablet by mouth at bedtime as needed. For insomnia/headache      . ezetimibe (ZETIA) 10 MG tablet Take 1 tablet (10 mg total) by mouth daily.  30 tablet  6  . fish oil-omega-3 fatty acids 1000 MG capsule Take 1 g by mouth 2 (two) times daily.       Marland Kitchen FLAXSEED, LINSEED, PO Take 1 capsule by mouth daily.       Marland Kitchen ketoconazole (NIZORAL) 2 % shampoo Apply topically 2 (two) times a week.  120 mL  0  . losartan (COZAAR) 25 MG tablet TAKE ONE TABLET BY  MOUTH EVERY DAY  30 tablet  6  . metoprolol succinate (TOPROL-XL) 25 MG 24 hr tablet       . nitroGLYCERIN (NITROSTAT) 0.4 MG SL tablet Place 0.4 mg under the tongue every 5 (five) minutes as needed. For chest pain      . pantoprazole (PROTONIX) 40 MG tablet Take 1 tablet (40 mg total) by mouth daily.  30 tablet  11  . polyethylene glycol (MIRALAX / GLYCOLAX) packet Take 17 g by mouth daily.       . pravastatin (PRAVACHOL) 40 MG tablet Take 1 tablet (40 mg total) by mouth at bedtime.  30 tablet  5  . vitamin E (VITAMIN E) 400 UNIT capsule Take 400 Units by mouth daily.       No current facility-administered medications for this visit.    Past Medical History  Diagnosis Date  . DYSLIPIDEMIA   . Anxiety state, unspecified   . Essential hypertension, benign   . CORONARY ATHEROSCLEROSIS NATIVE CORONARY ARTERY   . CHEST PAIN UNSPECIFIED   . COLONIC POLYPS, HX OF     Past Surgical History  Procedure Laterality Date  . Coronary artery bypass graft    .  Nasal sinus surgery    . Polypectomy    . 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  Otherwise as stated in the HPI and negative for all other systems.  PHYSICAL EXAM BP 115/50  Pulse 88  Ht 5\' 5"  (1.651 m)  Wt 130 lb (58.968 kg)  BMI 21.63 kg/m2 GENERAL:  Well appearing NECK:  No jugular venous distention, waveform within normal limits, carotid upstroke brisk and symmetric, no bruits, no thyromegaly LUNGS:  Clear to auscultation bilaterally BACK:  No CVA tenderness CHEST:  Well healed sternotomy scar. HEART:  PMI not displaced or sustained,S1 and S2 within normal limits, no S3, no S4, no clicks, no rubs, no murmurs ABD:  Flat, positive bowel sounds normal in frequency in pitch, no bruits, no rebound, no guarding, no midline pulsatile  mass, no hepatomegaly, no splenomegaly EXT:  2 plus pulses throughout, no edema, no cyanosis no clubbing   EKG:  Sinus rhythm, rate 59, axis within normal limits, intervals within normal limits, no acute ST-T wave changes.  04/28/13   ASSESSMENT AND PLAN  CORONARY ATHEROSCLEROSIS NATIVE CORONARY ARTERY -  Her pain is atypical. I will bring the patient back for a POET (Plain Old Exercise Test). This will allow me to screen for obstructive coronary disease, risk stratify and very importantly provide a prescription for exercise.  ESSENTIAL HYPERTENSION, BENIGN -  The blood pressure is at target. No change in medications is indicated. We will continue with therapeutic lifestyle changes (TLC).   DYSLIPIDEMIA -  She will remain on the meds as listed.

## 2013-05-11 NOTE — Patient Instructions (Addendum)
The current medical regimen is effective;  continue present plan and medications.  Your physician has requested that you have an exercise tolerance test. For further information please visit [email or web address]. Please also follow instruction sheet, as given.   

## 2013-05-21 ENCOUNTER — Other Ambulatory Visit: Payer: Self-pay | Admitting: Family Medicine

## 2013-05-22 NOTE — Telephone Encounter (Signed)
Medication refilled per protocol. 

## 2013-05-24 ENCOUNTER — Other Ambulatory Visit: Payer: Self-pay

## 2013-05-26 ENCOUNTER — Other Ambulatory Visit: Payer: Medicare Other

## 2013-05-30 ENCOUNTER — Ambulatory Visit: Payer: Medicare Other | Admitting: Family Medicine

## 2013-05-31 ENCOUNTER — Encounter: Payer: Self-pay | Admitting: Nurse Practitioner

## 2013-05-31 ENCOUNTER — Ambulatory Visit (INDEPENDENT_AMBULATORY_CARE_PROVIDER_SITE_OTHER): Payer: Medicare Other | Admitting: Nurse Practitioner

## 2013-05-31 VITALS — BP 148/64 | HR 68 | Resp 20

## 2013-05-31 DIAGNOSIS — I2510 Atherosclerotic heart disease of native coronary artery without angina pectoris: Secondary | ICD-10-CM

## 2013-05-31 DIAGNOSIS — R079 Chest pain, unspecified: Secondary | ICD-10-CM

## 2013-05-31 NOTE — Progress Notes (Signed)
Exercise Treadmill Test  Pre-Exercise Testing Evaluation Rhythm: normal sinus  Rate: 65     Test  Exercise Tolerance Test Ordering MD: Angelina Sheriff, MD  Interpreting MD: Norma Fredrickson, NP  Unique Test No: 1  Treadmill:  1  Indication for ETT: chest pain - rule out ischemia  Contraindication to ETT: No   Stress Modality: exercise - treadmill  Cardiac Imaging Performed: non   Protocol: standard Bruce - maximal  Max BP:  229/79  Max MPHR (bpm):  153 85% MPR (bpm):  130  MPHR obtained (bpm):  151 % MPHR obtained:  99%  Reached 85% MPHR (min:sec):  6:49 Total Exercise Time (min-sec):  9 minutes  Workload in METS:   Borg Scale: 14  Reason ETT Terminated:  desired heart rate attained    ST Segment Analysis At Rest: normal ST segments - no evidence of significant ST depression With Exercise: no evidence of significant ST depression  Other Information Arrhythmia:  Yes Angina during ETT:  absent (0) Quality of ETT:  diagnostic  ETT Interpretation:  normal - no evidence of ischemia by ST analysis  Comments: Patient presents today for routine GXT. Has had some atypical chest pain with palpitations.   Today, she exercised on the standard Bruce protocol for 9 minutes. Good exercise tolerance. Hypertensive BP response. Clinically negative for chest pain. EKG negative for ischemia. Very frequent PACs noted at rest and in recovery. Rare PVC with exercise. One brief run of PAT (6 beats). No significant arrhythmia noted.   Recommendations: Have discussed with her about increasing her Toprol - she has already done this - does not wish to take additional medicines at this time. She had had her medicines prior to this test today. Overall, her GXT is negative for ischemia but she is felt to need further follow up for her BP. I have asked her to monitor at home and let her PCP know at her follow up visit with him.   Patient is agreeable to this plan and will call if any problems develop in the  interim.   Rosalio Macadamia, RN, ANP-C [facility] [address] [place], [place]  [address]

## 2013-06-08 ENCOUNTER — Encounter: Payer: Self-pay | Admitting: Gastroenterology

## 2013-06-13 ENCOUNTER — Encounter: Payer: Self-pay | Admitting: Gastroenterology

## 2013-06-16 ENCOUNTER — Telehealth: Payer: Self-pay | Admitting: Cardiology

## 2013-06-16 NOTE — Telephone Encounter (Signed)
Reviewed results of GXT with pt.  She will follow her blood pressure at home and call if problems

## 2013-06-16 NOTE — Telephone Encounter (Signed)
Pt calling re stress test results. °

## 2013-06-27 ENCOUNTER — Ambulatory Visit: Payer: Medicare Other | Admitting: Cardiovascular Disease

## 2013-07-06 ENCOUNTER — Encounter: Payer: Self-pay | Admitting: Family Medicine

## 2013-07-06 ENCOUNTER — Ambulatory Visit (INDEPENDENT_AMBULATORY_CARE_PROVIDER_SITE_OTHER): Payer: Medicare Other | Admitting: Family Medicine

## 2013-07-06 VITALS — BP 118/58 | HR 60 | Temp 97.7°F | Resp 12 | Ht 63.3 in | Wt 130.0 lb

## 2013-07-06 DIAGNOSIS — M2550 Pain in unspecified joint: Secondary | ICD-10-CM

## 2013-07-06 DIAGNOSIS — Z23 Encounter for immunization: Secondary | ICD-10-CM

## 2013-07-06 DIAGNOSIS — IMO0001 Reserved for inherently not codable concepts without codable children: Secondary | ICD-10-CM

## 2013-07-06 NOTE — Progress Notes (Signed)
Subjective:    Patient ID: Nancy [name], female    DOB: 03/21/1946, 67 y.o.   MRN: [id number]  HPI  Patient complains of 5 months symmetric myalgias in her thighs and calves and forearms. She's also complaining of pain in the bilateral hips knees and wrists.   She is also reporting muscle weakness. She has to use her arms to help push herself up from a squatted position.  She is going twice due to her knees buckling due to weakness in her thigh muscles.  She does have palpable crepitus in both knee joints consistent with osteoarthritis however the muscle pain and muscle weakness seems out of proportion to what expect from typical osteoarthritis. Furthermore she is having symmetric polyarthralgias and polymyalgias.  She is currently on pravastatin and zetia for HLD. Past Medical History  Diagnosis Date  . DYSLIPIDEMIA   . Anxiety state, unspecified   . Essential hypertension, benign   . CORONARY ATHEROSCLEROSIS NATIVE CORONARY ARTERY   . CHEST PAIN UNSPECIFIED   . COLONIC POLYPS, HX OF    Current Outpatient Prescriptions on File Prior to Visit  Medication Sig Dispense Refill  . Alpha-D-Galactosidase (BEANO PO) Take 2-3 tablets by mouth daily. Take 2-3 pills before each meal a day      . aspirin 81 MG tablet Take 81 mg by mouth daily.       Marland Kitchen BIOTIN 5000 PO Take 5,000 mcg by mouth daily.      . Cholecalciferol (VITAMIN D-3 PO) Take 1 tablet by mouth daily.       . Coenzyme Q10 (CO Q 10 PO) Take 1 capsule by mouth every other day.      . diphenhydrAMINE (BENADRYL) 25 MG tablet Take 25 mg by mouth at bedtime as needed. For sleep      . diphenhydramine-acetaminophen (TYLENOL PM) 25-500 MG TABS Take 1 tablet by mouth at bedtime as needed. For insomnia/headache      . ezetimibe (ZETIA) 10 MG tablet Take 1 tablet (10 mg total) by mouth daily.  30 tablet  6  . fish oil-omega-3 fatty acids 1000 MG capsule Take 1 g by mouth 2 (two) times daily.       Marland Kitchen FLAXSEED, LINSEED, PO Take 1 capsule by mouth  daily.       Marland Kitchen ketoconazole (NIZORAL) 2 % shampoo Apply topically 2 (two) times a week.  120 mL  0  . losartan (COZAAR) 25 MG tablet TAKE ONE TABLET BY MOUTH EVERY DAY  30 tablet  6  . metoprolol succinate (TOPROL-XL) 25 MG 24 hr tablet 75 mg once a day      . nitroGLYCERIN (NITROSTAT) 0.4 MG SL tablet Place 0.4 mg under the tongue every 5 (five) minutes as needed. For chest pain      . pantoprazole (PROTONIX) 40 MG tablet Take 1 tablet (40 mg total) by mouth daily.  30 tablet  11  . polyethylene glycol (MIRALAX / GLYCOLAX) packet Take 17 g by mouth daily.       . pravastatin (PRAVACHOL) 40 MG tablet Take 1 tablet (40 mg total) by mouth at bedtime.  30 tablet  5  . vitamin E (VITAMIN E) 400 UNIT capsule Take 400 Units by mouth daily.       No current facility-administered medications on file prior to visit.   Allergies  Allergen Reactions  . Bactrim [Sulfamethoxazole W-Trimethoprim] Other (See Comments)    Blisters/skin peeling  . Morphine And Related     nausea  .  Prednisone     Unknown  . Sulfonamide Derivatives Other (See Comments)    Blisters/skin peeling   History   Social History  . Marital Status: Married    Spouse Name: N/A    Number of Children: N/A  . Years of Education: N/A   Occupational History  . Not on file.   Social History Main Topics  . Smoking status: Never Smoker   . Smokeless tobacco: Not on file  . Alcohol Use: No  . Drug Use: No  . Sexual Activity: Not on file   Other Topics Concern  . Not on file   Social History Narrative  . No narrative on file     Review of Systems  All other systems reviewed and are negative.       Objective:   Physical Exam  Vitals reviewed. Constitutional: She appears well-developed and well-nourished.  Cardiovascular: Normal rate and regular rhythm.   Pulmonary/Chest: Effort normal and breath sounds normal.  Abdominal: Soft. Bowel sounds are normal.  Musculoskeletal:       Right wrist: Normal.       Left  wrist: Normal.       Right hip: Normal.       Left hip: Normal.       Right knee: She exhibits normal range of motion, no swelling, no effusion, no erythema, no LCL laxity, normal patellar mobility, no bony tenderness, normal meniscus and no MCL laxity. Tenderness found. Medial joint line tenderness noted. No MCL and no LCL tenderness noted.       Left knee: She exhibits erythema. She exhibits normal range of motion, no swelling, no effusion, normal alignment, no LCL laxity, normal patellar mobility, normal meniscus and no MCL laxity. Tenderness found. Medial joint line and lateral joint line tenderness noted. No MCL and no LCL tenderness noted.          Assessment & Plan:  1. Need for prophylactic vaccination and inoculation against influenza - Flu Vaccine QUAD 36+ mos IM  2. Need for prophylactic vaccination and inoculation against unspecified single disease - Pneumococcal polysaccharide vaccine 23-valent greater than or equal to 2yo subcutaneous/IM  3. Myalgia and myositis  4. Arthralgia I have asked the patient to temporarily discontinue pravastatin and zetia.  I would like to see her back in 2 weeks. If her pain is completely better, we will discuss options for treating hyperlipidemia especially given her history of coronary artery disease. I would recommend trying a different statin medication due to the significant benefit in secondary prevention of heart disease.  If symptoms are no better, I would begin a workup including a rheumatoid factor, ANA, sed rate, x-rays of the knees and hips and wrists.

## 2013-07-19 ENCOUNTER — Other Ambulatory Visit: Payer: Self-pay | Admitting: Family Medicine

## 2013-07-19 ENCOUNTER — Other Ambulatory Visit: Payer: Self-pay | Admitting: Cardiology

## 2013-07-20 ENCOUNTER — Encounter: Payer: Self-pay | Admitting: Family Medicine

## 2013-07-20 ENCOUNTER — Ambulatory Visit (INDEPENDENT_AMBULATORY_CARE_PROVIDER_SITE_OTHER): Payer: Medicare Other | Admitting: Family Medicine

## 2013-07-20 VITALS — BP 98/64 | HR 62 | Temp 97.5°F | Resp 14 | Wt 129.0 lb

## 2013-07-20 DIAGNOSIS — M25561 Pain in right knee: Secondary | ICD-10-CM

## 2013-07-20 DIAGNOSIS — M25569 Pain in unspecified knee: Secondary | ICD-10-CM

## 2013-07-20 DIAGNOSIS — E785 Hyperlipidemia, unspecified: Secondary | ICD-10-CM

## 2013-07-20 LAB — SEDIMENTATION RATE: Sed Rate: 6 mm/hr (ref 0–22)

## 2013-07-20 MED ORDER — ATORVASTATIN CALCIUM 20 MG PO TABS: 20.0000 mg | ORAL_TABLET | Freq: Every day | ORAL | Status: DC

## 2013-07-20 NOTE — Progress Notes (Signed)
Subjective:    Patient ID: Nancy [name], female    DOB: 1946/01/08, 67 y.o.   MRN: [id number]  HPI The patient's last office visit, she is complaining of significant and severe pain in both knees and both ankles. He temporarily discontinued her cholesterol medicines to see if the pain would improve off the statin. She is here now 2-3 weeks later saying that the pain is only slightly better. She continues to have daily aches and pains particularly in both knees and both ankles. Chest has a significant past medical history for coronary artery disease and would benefit from being on a statin. She is hesitant to resume her old medications. Past Medical History  Diagnosis Date  . DYSLIPIDEMIA   . Anxiety state, unspecified   . Essential hypertension, benign   . CORONARY ATHEROSCLEROSIS NATIVE CORONARY ARTERY   . CHEST PAIN UNSPECIFIED   . COLONIC POLYPS, HX OF    Past Surgical History  Procedure Laterality Date  . Coronary artery bypass graft    . Nasal sinus surgery    . Polypectomy    . 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 Alpha-D-Galactosidase (BEANO PO) Take 2-3 tablets by mouth daily. Take 2-3 pills before each meal a day      . aspirin 81 MG tablet Take 81 mg by mouth daily.       Marland Kitchen BIOTIN 5000 PO Take 5,000 mcg by mouth daily.      . Cholecalciferol (VITAMIN D-3 PO) Take 1 tablet by mouth daily.       . Coenzyme Q10 (CO Q 10 PO) Take 1 capsule by mouth every other day.      . diphenhydrAMINE (BENADRYL) 25 MG tablet Take 25 mg by mouth at bedtime as needed. For sleep      . diphenhydramine-acetaminophen (TYLENOL PM) 25-500 MG TABS Take 1 tablet by mouth at bedtime as needed. For insomnia/headache      . fish  oil-omega-3 fatty acids 1000 MG capsule Take 1 g by mouth 2 (two) times daily.       Marland Kitchen FLAXSEED, LINSEED, PO Take 1 capsule by mouth daily.       Marland Kitchen ketoconazole (NIZORAL) 2 % shampoo Apply topically 2 (two) times a week.  120 mL  0  . losartan (COZAAR) 25 MG tablet TAKE ONE TABLET BY MOUTH EVERY DAY  30 tablet  6  . metoprolol succinate (TOPROL-XL) 25 MG 24 hr tablet TAKE THREE TABLETS BY MOUTH ONCE DAILY  90 tablet  3  . nitroGLYCERIN (NITROSTAT) 0.4 MG SL tablet Place 0.4 mg under the tongue every 5 (five) minutes as needed. For chest pain      . pantoprazole (PROTONIX) 40 MG tablet Take 1 tablet (40 mg total) by mouth daily.  30 tablet  11  . polyethylene glycol (MIRALAX / GLYCOLAX) packet Take 17 g by mouth daily.       . vitamin E (VITAMIN E) 400 UNIT capsule Take 400 Units by mouth daily.      Marland Kitchen ZETIA 10 MG tablet TAKE ONE TABLET BY MOUTH ONCE DAILY  30 tablet  6  . pravastatin (PRAVACHOL) 40 MG tablet Take 1 tablet (40 mg total) by mouth at bedtime.  30  tablet  5   No current facility-administered medications on file prior to visit.   Allergies  Allergen Reactions  . Bactrim [Sulfamethoxazole-Trimethoprim] Other (See Comments)    Blisters/skin peeling  . Morphine And Related     nausea  . Prednisone     Unknown  . Sulfonamide Derivatives Other (See Comments)    Blisters/skin peeling      Review of Systems  All other systems reviewed and are negative.       Objective:   Physical Exam  Vitals reviewed. Cardiovascular: Normal rate and regular rhythm.   Pulmonary/Chest: Effort normal and breath sounds normal.  Musculoskeletal: She exhibits no edema.       Right knee: She exhibits decreased range of motion. She exhibits no swelling, no effusion, no erythema, normal alignment, no LCL laxity, normal patellar mobility, no bony tenderness, normal meniscus and no MCL laxity.       Left knee: She exhibits decreased range of motion. She exhibits no effusion, no erythema, normal  alignment, no LCL laxity, normal patellar mobility, normal meniscus and no MCL laxity. No medial joint line, no lateral joint line, no MCL and no LCL tenderness noted.          Assessment & Plan:  1. HLD (hyperlipidemia) Resume Lipitor 20 mg by mouth daily.  recheck fasting lipid panel in 3 months. - atorvastatin (LIPITOR) 20 MG tablet; Take 1 tablet (20 mg total) by mouth daily.  Dispense: 90 tablet; Refill: 3  2. Knee pain, bilateral I believe her pain is likely osteoarthritis. I will check x-rays of both knees and a sedimentation rate. The sedimentation rate is normal and her x-rays confirm osteoarthritis, I would start the patient on an anti-inflammatory medication. We have to discuss the long-term cardiovascular risk of daily NSAID therapy however. - DG Knee Complete 4 Views Left; Future - DG Knee Complete 4 Views Right; Future - Sedimentation Rate

## 2013-07-21 ENCOUNTER — Ambulatory Visit
Admission: RE | Admit: 2013-07-21 | Discharge: 2013-07-21 | Disposition: A | Discharge status: Home / Self Care | Payer: Medicare Other | Source: Ambulatory Visit | Attending: Family Medicine | Admitting: Family Medicine

## 2013-07-21 DIAGNOSIS — M25569 Pain in unspecified knee: Secondary | ICD-10-CM | POA: Diagnosis not present

## 2013-07-21 DIAGNOSIS — M25561 Pain in right knee: Secondary | ICD-10-CM

## 2013-07-26 ENCOUNTER — Telehealth: Payer: Self-pay | Admitting: Family Medicine

## 2013-07-31 ENCOUNTER — Ambulatory Visit: Payer: Medicare Other | Admitting: Family Medicine

## 2013-08-07 ENCOUNTER — Ambulatory Visit (AMBULATORY_SURGERY_CENTER): Payer: Self-pay | Admitting: *Deleted

## 2013-08-07 VITALS — Ht 63.75 in | Wt 131.4 lb

## 2013-08-07 DIAGNOSIS — Z86010 Personal history of colonic polyps: Secondary | ICD-10-CM

## 2013-08-07 MED ORDER — MOVIPREP 100 G PO SOLR: ORAL | Status: DC

## 2013-08-07 NOTE — Progress Notes (Signed)
No allergies to eggs or soy. No problems with anesthesia.  

## 2013-08-18 ENCOUNTER — Encounter: Payer: Self-pay | Admitting: Family Medicine

## 2013-08-21 ENCOUNTER — Encounter: Payer: Self-pay | Admitting: Gastroenterology

## 2013-08-21 ENCOUNTER — Ambulatory Visit (AMBULATORY_SURGERY_CENTER): Payer: Medicare Other | Admitting: Gastroenterology

## 2013-08-21 VITALS — BP 108/70 | HR 57 | Temp 98.1°F | Resp 25 | Ht 63.75 in | Wt 131.0 lb

## 2013-08-21 DIAGNOSIS — K5730 Diverticulosis of large intestine without perforation or abscess without bleeding: Secondary | ICD-10-CM

## 2013-08-21 DIAGNOSIS — D126 Benign neoplasm of colon, unspecified: Secondary | ICD-10-CM

## 2013-08-21 DIAGNOSIS — Z86010 Personal history of colonic polyps: Secondary | ICD-10-CM | POA: Diagnosis not present

## 2013-08-21 MED ORDER — SODIUM CHLORIDE 0.9 % IV SOLN: 500.0000 mL | INTRAVENOUS | Status: DC

## 2013-08-21 NOTE — Patient Instructions (Signed)
Discharge instructions given with verbal understanding. Handouts on polyps and diverticulosis. Resume previous medications. YOU HAD AN ENDOSCOPIC PROCEDURE TODAY AT THE [facility]: Refer to the procedure report that was given to you for any specific questions about what was found during the examination.  If the procedure report does not answer your questions, please call your gastroenterologist to clarify.  If you requested that your care partner not be given the details of your procedure findings, then the procedure report has been included in a sealed envelope for you to review at your convenience later.  YOU SHOULD EXPECT: Some feelings of bloating in the abdomen. Passage of more gas than usual.  Walking can help get rid of the air that was put into your GI tract during the procedure and reduce the bloating. If you had a lower endoscopy (such as a colonoscopy or flexible sigmoidoscopy) you may notice spotting of blood in your stool or on the toilet paper. If you underwent a bowel prep for your procedure, then you may not have a normal bowel movement for a few days.  DIET: Your first meal following the procedure should be a light meal and then it is ok to progress to your normal diet.  A half-sandwich or bowl of soup is an example of a good first meal.  Heavy or fried foods are harder to digest and may make you feel nauseous or bloated.  Likewise meals heavy in dairy and vegetables can cause extra gas to form and this can also increase the bloating.  Drink plenty of fluids but you should avoid alcoholic beverages for 24 hours.  ACTIVITY: Your care partner should take you home directly after the procedure.  You should plan to take it easy, moving slowly for the rest of the day.  You can resume normal activity the day after the procedure however you should NOT DRIVE or use heavy machinery for 24 hours (because of the sedation medicines used during the test).    SYMPTOMS TO REPORT  IMMEDIATELY: A gastroenterologist can be reached at any hour.  During normal business hours, 8:30 AM to 5:00 PM Monday through Friday, call [phone].  After hours and on weekends, please call the GI answering service at [phone] who will take a message and have the physician on call contact you.   Following lower endoscopy (colonoscopy or flexible sigmoidoscopy):  Excessive amounts of blood in the stool  Significant tenderness or worsening of abdominal pains  Swelling of the abdomen that is new, acute  Fever of 100F or higher  FOLLOW UP: If any biopsies were taken you will be contacted by phone or by letter within the next 1-3 weeks.  Call your gastroenterologist if you have not heard about the biopsies in 3 weeks.  Our staff will call the home number listed on your records the next business day following your procedure to check on you and address any questions or concerns that you may have at that time regarding the information given to you following your procedure. This is a courtesy call and so if there is no answer at the home number and we have not heard from you through the emergency physician on call, we will assume that you have returned to your regular daily activities without incident.  SIGNATURES/CONFIDENTIALITY: You and/or your care partner have signed paperwork which will be entered into your electronic medical record.  These signatures attest to the fact that that the information above on your After Visit Summary   has been reviewed and is understood.  Full responsibility of the confidentiality of this discharge information lies with you and/or your care-partner. 

## 2013-08-21 NOTE — Progress Notes (Signed)
Once the cecum was reached by Dr. Christella Hartigan, the pt relaxed and rested comfortably. Maw

## 2013-08-21 NOTE — Op Note (Signed)
 [facility] [address] [place], [address]   COLONOSCOPY PROCEDURE REPORT  PATIENT: Nancy [name], Nancy [name]  MR#: [id number] BIRTHDATE: [birth year] , 67  yrs. old GENDER: Female ENDOSCOPIST: Rachael Fee, MD PROCEDURE DATE:  08/21/2013 PROCEDURE:   Colonoscopy with snare polypectomy First Screening Colonoscopy - Avg.  risk and is 50 yrs.  old or older - No.  Prior Negative Screening - Now for repeat screening. N/A  History of Adenoma - Now for follow-up colonoscopy & has been > or = to 3 yrs.  Yes hx of adenoma.  Has been 3 or more years since last colonoscopy.  Polyps Removed Today? Yes. ASA CLASS:   Class II INDICATIONS:25mm adenom removed 2006, [place]; 2009 colonoscopy Christella Hartigan found no polyps. MEDICATIONS: Fentanyl 50 mcg IV, Versed 5 mg IV, and These medications were titrated to patient response per physician's verbal order DESCRIPTION OF PROCEDURE:   After the risks benefits and alternatives of the procedure were thoroughly explained, informed consent was obtained.  A digital rectal exam revealed no abnormalities of the rectum.   The LB [id number]  endoscope was introduced through the anus and advanced to the cecum, which was identified by both the appendix and ileocecal valve. No adverse events experienced.   The quality of the prep was good.  The instrument was then slowly withdrawn as the colon was fully examined.  COLON FINDINGS: One polyp was found, removed and sent to pathology. This was 3mm across, sessile, located in descending segment, removed with cold snare.  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  The examination was otherwise normal.  Retroflexed views revealed no abnormalities. The time to cecum=2 minutes 54 seconds.  Withdrawal time=7 minutes 06 seconds.  The scope was withdrawn and the procedure completed. COMPLICATIONS: There were no complications.  ENDOSCOPIC IMPRESSION: One polyp was found, removed and sent to  pathology. 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 The examination was otherwise normal.  RECOMMENDATIONS: If the polyp(s) removed today are proven to be adenomatous (pre-cancerous) polyps, you will need a repeat colonoscopy in 5 years.  Otherwise you should continue to follow colorectal cancer screening guidelines for "routine risk" patients with colonoscopy in 10 years.  You will receive a letter within 1-2 weeks with the results of your biopsy as well as final recommendations.  Please call my office if you have not received a letter after 3 weeks.   eSigned:  Rachael Fee, MD 08/21/2013 9:34 AM  cc: Lynnea Ferrier, MD

## 2013-08-21 NOTE — Progress Notes (Signed)
The pt tolerated the colonoscopy very well. Maw   

## 2013-08-21 NOTE — Progress Notes (Signed)
Patient did not experience any of the following events: a burn prior to discharge; a fall within the facility; wrong site/side/patient/procedure/implant event; or a hospital transfer or hospital admission upon discharge from the facility. (G8907) Patient did not have preoperative order for IV antibiotic SSI prophylaxis. (G8918)  

## 2013-08-22 ENCOUNTER — Telehealth: Payer: Self-pay | Admitting: *Deleted

## 2013-08-22 NOTE — Telephone Encounter (Signed)
  Follow up Call-  Call back number 08/21/2013  Post procedure Call Back phone  # [phone]  Permission to leave phone message Yes     Patient questions:  Do you have a fever, pain , or abdominal swelling? no Pain Score  0 *  Have you tolerated food without any problems? yes  Have you been able to return to your normal activities? yes  Do you have any questions about your discharge instructions: Diet   no Medications  no Follow up visit  no  Do you have questions or concerns about your Care? no  Actions: * If pain score is 4 or above: No action needed, pain <4.

## 2013-08-23 ENCOUNTER — Other Ambulatory Visit: Payer: Self-pay

## 2013-08-23 MED ORDER — NITROGLYCERIN 0.4 MG SL SUBL: 0.4000 mg | SUBLINGUAL_TABLET | SUBLINGUAL | Status: DC | PRN

## 2013-08-25 ENCOUNTER — Encounter: Payer: Self-pay | Admitting: Gastroenterology

## 2013-10-16 ENCOUNTER — Other Ambulatory Visit: Payer: Medicare Other

## 2013-10-16 DIAGNOSIS — Z79899 Other long term (current) drug therapy: Secondary | ICD-10-CM

## 2013-10-16 DIAGNOSIS — I10 Essential (primary) hypertension: Secondary | ICD-10-CM | POA: Diagnosis not present

## 2013-10-16 DIAGNOSIS — E785 Hyperlipidemia, unspecified: Secondary | ICD-10-CM | POA: Diagnosis not present

## 2013-10-16 LAB — CBC WITH DIFFERENTIAL/PLATELET
Basophils Absolute: 0.1 10*3/uL (ref 0.0–0.1)
Eosinophils Relative: 2 % (ref 0–5)
Hemoglobin: 13.8 g/dL (ref 12.0–15.0)
Lymphocytes Relative: 30 % (ref 12–46)
Lymphs Abs: 1.9 10*3/uL (ref 0.7–4.0)
Neutro Abs: 3.6 10*3/uL (ref 1.7–7.7)
Platelets: 222 10*3/uL (ref 150–400)
RBC: 4.29 MIL/uL (ref 3.87–5.11)
RDW: 13.6 % (ref 11.5–15.5)
WBC: 6.2 10*3/uL (ref 4.0–10.5)

## 2013-10-16 LAB — COMPREHENSIVE METABOLIC PANEL
ALT: 28 U/L (ref 0–35)
AST: 29 U/L (ref 0–37)
Alkaline Phosphatase: 56 U/L (ref 39–117)
BUN: 10 mg/dL (ref 6–23)
CO2: 27 mEq/L (ref 19–32)
Calcium: 9.4 mg/dL (ref 8.4–10.5)
Chloride: 105 mEq/L (ref 96–112)
Creat: 0.86 mg/dL (ref 0.50–1.10)
Potassium: 4.9 mEq/L (ref 3.5–5.3)
Sodium: 141 mEq/L (ref 135–145)
Total Protein: 6.8 g/dL (ref 6.0–8.3)

## 2013-10-16 LAB — LIPID PANEL
Cholesterol: 122 mg/dL (ref 0–200)
LDL Cholesterol: 53 mg/dL (ref 0–99)
Total CHOL/HDL Ratio: 2.4 Ratio

## 2013-10-20 ENCOUNTER — Encounter: Payer: Self-pay | Admitting: Family Medicine

## 2013-10-20 ENCOUNTER — Ambulatory Visit (INDEPENDENT_AMBULATORY_CARE_PROVIDER_SITE_OTHER): Payer: Medicare Other | Admitting: Family Medicine

## 2013-10-20 VITALS — BP 130/64 | HR 64 | Temp 97.7°F | Resp 12 | Ht 65.0 in | Wt 130.0 lb

## 2013-10-20 DIAGNOSIS — Z23 Encounter for immunization: Secondary | ICD-10-CM | POA: Diagnosis not present

## 2013-10-20 DIAGNOSIS — I10 Essential (primary) hypertension: Secondary | ICD-10-CM

## 2013-10-20 DIAGNOSIS — L719 Rosacea, unspecified: Secondary | ICD-10-CM | POA: Diagnosis not present

## 2013-10-20 DIAGNOSIS — E785 Hyperlipidemia, unspecified: Secondary | ICD-10-CM

## 2013-10-20 DIAGNOSIS — I25810 Atherosclerosis of coronary artery bypass graft(s) without angina pectoris: Secondary | ICD-10-CM | POA: Diagnosis not present

## 2013-10-20 MED ORDER — DOXYCYCLINE HYCLATE 100 MG PO TABS: 100.0000 mg | ORAL_TABLET | Freq: Two times a day (BID) | ORAL | Status: DC

## 2013-10-20 NOTE — Progress Notes (Signed)
Subjective:    Patient ID: Nancy [name], female    DOB: 1946-02-05, 68 y.o.   MRN: [id number]  HPI Patient has a history of coronary artery disease, hypertension, and dyslipidemia. She is here today for recheck of his conditions. Her most recent labwork as listed below: Lab on 10/16/2013  Component Date Value Range Status  . WBC 10/16/2013 6.2  4.0 - 10.5 K/uL Final  . RBC 10/16/2013 4.29  3.87 - 5.11 MIL/uL Final  . Hemoglobin 10/16/2013 13.8  12.0 - 15.0 g/dL Final  . HCT 10/16/2013 39.5  36.0 - 46.0 % Final  . MCV 10/16/2013 92.1  78.0 - 100.0 fL Final  . MCH 10/16/2013 32.2  26.0 - 34.0 pg Final  . MCHC 10/16/2013 34.9  30.0 - 36.0 g/dL Final  . RDW 10/16/2013 13.6  11.5 - 15.5 % Final  . Platelets 10/16/2013 222  150 - 400 K/uL Final  . Neutrophils Relative % 10/16/2013 59  43 - 77 % Final  . Neutro Abs 10/16/2013 3.6  1.7 - 7.7 K/uL Final  . Lymphocytes Relative 10/16/2013 30  12 - 46 % Final  . Lymphs Abs 10/16/2013 1.9  0.7 - 4.0 K/uL Final  . Monocytes Relative 10/16/2013 8  3 - 12 % Final  . Monocytes Absolute 10/16/2013 0.5  0.1 - 1.0 K/uL Final  . Eosinophils Relative 10/16/2013 2  0 - 5 % Final  . Eosinophils Absolute 10/16/2013 0.2  0.0 - 0.7 K/uL Final  . Basophils Relative 10/16/2013 1  0 - 1 % Final  . Basophils Absolute 10/16/2013 0.1  0.0 - 0.1 K/uL Final  . Smear Review 10/16/2013 Criteria for review not met   Final  . Sodium 10/16/2013 141  135 - 145 mEq/L Final  . Potassium 10/16/2013 4.9  3.5 - 5.3 mEq/L Final  . Chloride 10/16/2013 105  96 - 112 mEq/L Final  . CO2 10/16/2013 27  19 - 32 mEq/L Final  . Glucose, Bld 10/16/2013 78  70 - 99 mg/dL Final  . BUN 10/16/2013 10  6 - 23 mg/dL Final  . Creat 10/16/2013 0.86  0.50 - 1.10 mg/dL Final  . Total Bilirubin 10/16/2013 0.5  0.3 - 1.2 mg/dL Final  . Alkaline Phosphatase 10/16/2013 56  39 - 117 U/L Final  . AST 10/16/2013 29  0 - 37 U/L Final  . ALT 10/16/2013 28  0 - 35 U/L Final  . Total Protein  10/16/2013 6.8  6.0 - 8.3 g/dL Final  . Albumin 10/16/2013 4.4  3.5 - 5.2 g/dL Final  . Calcium 10/16/2013 9.4  8.4 - 10.5 mg/dL Final  . Cholesterol 10/16/2013 122  0 - 200 mg/dL Final   Comment: ATP III Classification:                                < 200        mg/dL        Desirable                               200 - 239     mg/dL        Borderline High                               >= 240  mg/dL        High                             . Triglycerides 10/16/2013 88  <150 mg/dL Final  . HDL 10/16/2013 51  >39 mg/dL Final  . Total CHOL/HDL Ratio 10/16/2013 2.4   Final  . VLDL 10/16/2013 18  0 - 40 mg/dL Final  . LDL Cholesterol 10/16/2013 53  0 - 99 mg/dL Final   Comment:                            Total Cholesterol/HDL Ratio:CHD Risk                                                 Coronary Heart Disease Risk Table                                                                 Men       Women                                   1/2 Average Risk              3.4        3.3                                       Average Risk              5.0        4.4                                    2X Average Risk              9.6        7.1                                    3X Average Risk             23.4       11.0                          Use the calculated Patient Ratio above and the CHD Risk table                           to determine the patient's CHD Risk.                          ATP III Classification (LDL):                                <  100        mg/dL         Optimal                               100 - 129     mg/dL         Near or Above Optimal                               130 - 159     mg/dL         Borderline High                               160 - 189     mg/dL         High                                > 190        mg/dL         Very High                              She is doing well except for the fact she has myalgias and arthralgias that she associates with the  Lipitor. Otherwise she is doing fine. She does have an erythematous eruption on her cheeks and nose and above her right eye. It is consistent with rosacea. Erythematous papules and macules mainly on the cheeks. He also has blepharitis above the right eye. Past Medical History  Diagnosis Date  . DYSLIPIDEMIA   . Anxiety state, unspecified   . Essential hypertension, benign   . CORONARY ATHEROSCLEROSIS NATIVE CORONARY ARTERY   . CHEST PAIN UNSPECIFIED   . COLONIC POLYPS, HX OF    Current Outpatient Prescriptions on File Prior to Visit  Medication Sig Dispense Refill  . Alpha-D-Galactosidase (BEANO PO) Take 2-3 tablets by mouth daily. Take 2-3 pills before each meal a day      . aspirin 81 MG tablet Take 81 mg by mouth daily.       Marland Kitchen BIOTIN 5000 PO Take 5,000 mcg by mouth daily.      . Cholecalciferol (VITAMIN D-3 PO) Take 1 tablet by mouth daily.       . Coenzyme Q10 (CO Q 10 PO) Take 1 capsule by mouth every other day.      . diphenhydrAMINE (BENADRYL) 25 MG tablet Take 25 mg by mouth at bedtime as needed. For sleep      . diphenhydramine-acetaminophen (TYLENOL PM) 25-500 MG TABS Take 1 tablet by mouth at bedtime as needed. For insomnia/headache      . Fiber CHEW Chew by mouth daily.      . fish oil-omega-3 fatty acids 1000 MG capsule Take 1 g by mouth 2 (two) times daily.       Marland Kitchen FLAXSEED, LINSEED, PO Take 1 capsule by mouth daily.       Marland Kitchen losartan (COZAAR) 25 MG tablet TAKE ONE TABLET BY MOUTH EVERY DAY  30 tablet  6  . metoprolol succinate (TOPROL-XL) 25 MG 24 hr tablet TAKE THREE TABLETS BY MOUTH ONCE DAILY  90 tablet  3  . nitroGLYCERIN (NITROSTAT) 0.4 MG SL tablet  Place 1 tablet (0.4 mg total) under the tongue every 5 (five) minutes as needed. For chest pain  25 tablet  2  . pantoprazole (PROTONIX) 40 MG tablet Take 1 tablet (40 mg total) by mouth daily.  30 tablet  11  . polyethylene glycol (MIRALAX / GLYCOLAX) packet Take 17 g by mouth daily.       . vitamin E (VITAMIN E) 400 UNIT  capsule Take 400 Units by mouth daily.      Marland Kitchen ZETIA 10 MG tablet TAKE ONE TABLET BY MOUTH ONCE DAILY  30 tablet  6   No current facility-administered medications on file prior to visit.   Allergies  Allergen Reactions  . Bactrim [Sulfamethoxazole-Trimethoprim] Other (See Comments)    Blisters/skin peeling, nausea  . Morphine And Related     nausea  . Prednisone     Unknown  . Sulfonamide Derivatives Other (See Comments)    Blisters/skin peeling   History   Social History  . Marital Status: Married    Spouse Name: N/A    Number of Children: N/A  . Years of Education: N/A   Occupational History  . Not on file.   Social History Main Topics  . Smoking status: Never Smoker   . Smokeless tobacco: Never Used  . Alcohol Use: Yes     Comment: occasional glass wine  . Drug Use: No  . Sexual Activity: Not on file   Other Topics Concern  . Not on file   Social History Narrative  . No narrative on file      Review of Systems  All other systems reviewed and are negative.       Objective:   Physical Exam  Vitals reviewed. Constitutional: She appears well-developed and well-nourished.  Neck: No JVD present. No thyromegaly present.  Cardiovascular: Normal rate, regular rhythm, normal heart sounds and intact distal pulses.  Exam reveals no gallop and no friction rub.   No murmur heard. Pulmonary/Chest: Effort normal and breath sounds normal. No respiratory distress. She has no wheezes. She has no rales.  Abdominal: Soft. Bowel sounds are normal. She exhibits no distension. There is no tenderness. There is no rebound and no guarding.  Lymphadenopathy:    She has no cervical adenopathy.  Skin: Rash noted.   see the description of the rash and history of present illness        Assessment & Plan:  1. HTN (hypertension) Blood pressure is well controlled. Continue current medications at their present dosages.  2. Dyslipidemia Due to her arthralgias and myalgias, we  will decrease Lipitor to 20 mg every other day and recheck labs in 6  3. CAD (coronary artery disease) of artery bypass graft I will try to keep the patient on a statin given her history of coronary artery disease.  4. Rosacea - doxycycline (VIBRA-TABS) 100 MG tablet; Take 1 tablet (100 mg total) by mouth 2 (two) times daily.  Dispense: 20 tablet; Refill: 0  rash becomes recurrent, we can keep the patient on a low dose of doxycycline to prevent. The patient also received Prevnar 13 today in the office.

## 2013-10-20 NOTE — Addendum Note (Signed)
Addended by: Shary Decamp B on: 10/20/2013 09:47 AM   Modules accepted: Orders

## 2013-10-23 ENCOUNTER — Other Ambulatory Visit: Payer: Self-pay | Admitting: Cardiology

## 2013-10-24 DIAGNOSIS — H01029 Squamous blepharitis unspecified eye, unspecified eyelid: Secondary | ICD-10-CM | POA: Diagnosis not present

## 2013-10-25 DIAGNOSIS — N281 Cyst of kidney, acquired: Secondary | ICD-10-CM | POA: Diagnosis not present

## 2013-11-09 ENCOUNTER — Ambulatory Visit
Admission: RE | Admit: 2013-11-09 | Discharge: 2013-11-09 | Disposition: A | Discharge status: Home / Self Care | Payer: Medicare Other | Source: Ambulatory Visit | Attending: Physician Assistant | Admitting: Physician Assistant

## 2013-11-09 ENCOUNTER — Ambulatory Visit (INDEPENDENT_AMBULATORY_CARE_PROVIDER_SITE_OTHER): Payer: Medicare Other | Admitting: Physician Assistant

## 2013-11-09 ENCOUNTER — Encounter: Payer: Self-pay | Admitting: Physician Assistant

## 2013-11-09 VITALS — BP 126/74 | HR 56 | Resp 18 | Wt 127.0 lb

## 2013-11-09 DIAGNOSIS — S0990XA Unspecified injury of head, initial encounter: Secondary | ICD-10-CM

## 2013-11-09 NOTE — Progress Notes (Signed)
Patient ID: Nancy [name] MRN: [id number], DOB: [birth year], 68 y.o. Date of Encounter: @DATE @  Chief Complaint:  Chief Complaint  Patient presents with  . fell 10 days ago, hit head hard    back head still sore, area very sensative    HPI: 68 y.o. year old female  presents with above complaint.  She states that this happened 10 days ago. Says it was the morning that we had ice. She says that she stepped onto the back porch and did not realize that there was a clear sheet of ice covering the entire porch.  She stepped onto the porch and her legs went up in the air, she fell backwards, onto her back and slammed the back of her head against the porch very hard. Says back of her head is still sore. Says that last year she had a head injury. She ended up having a scan that showed a hematoma. End up having to have surgery. Says that with that episode she never had any neurologic deficit/changes. Only a "bump on her head".  Therefore, she says that with this episode, she and her family have been closely monitoring her. Says that her husband has been checking her pupils !! Says that she has had no weakness in either arm or leg, no slurred speech, no gait disturbance or staggering. Has had no nausea vomiting or lethargy. The only thing she has noticed is a were tender spot on the back of her head.  She says that her family finally insisted that she must come in for evaluation. Says that even with her prior hematoma she had no additional signs or symptoms other than just a protrusion of the hematoma itself and the pain/tenderness at that site. Had no neurologic changes/deficit with that episode. She had to have surgery for that hematoma.   Past Medical History  Diagnosis Date  . DYSLIPIDEMIA   . Anxiety state, unspecified   . Essential hypertension, benign   . CORONARY ATHEROSCLEROSIS NATIVE CORONARY ARTERY   . CHEST PAIN UNSPECIFIED   . COLONIC POLYPS, HX OF      Home Meds: See attached  medication section for current medication list. Any medications entered into computer today will not appear on this note's list. The medications listed below were entered prior to today. Current Outpatient Prescriptions on File Prior to Visit  Medication Sig Dispense Refill  . Alpha-D-Galactosidase (BEANO PO) Take 2-3 tablets by mouth daily. Take 2-3 pills before each meal a day      . aspirin 81 MG tablet Take 81 mg by mouth daily.       Marland Kitchen atorvastatin (LIPITOR) 20 MG tablet Take 20 mg by mouth daily.      Marland Kitchen BIOTIN 5000 PO Take 5,000 mcg by mouth daily.      . Cholecalciferol (VITAMIN D-3 PO) Take 1 tablet by mouth daily.       . Coenzyme Q10 (CO Q 10 PO) Take 1 capsule by mouth every other day.      . diphenhydrAMINE (BENADRYL) 25 MG tablet Take 25 mg by mouth at bedtime as needed. For sleep      . diphenhydramine-acetaminophen (TYLENOL PM) 25-500 MG TABS Take 1 tablet by mouth at bedtime as needed. For insomnia/headache      . doxycycline (VIBRA-TABS) 100 MG tablet Take 1 tablet (100 mg total) by mouth 2 (two) times daily.  20 tablet  0  . Fiber CHEW Chew by mouth daily.      Marland Kitchen  fish oil-omega-3 fatty acids 1000 MG capsule Take 1 g by mouth 2 (two) times daily.       Marland Kitchen FLAXSEED, LINSEED, PO Take 1 capsule by mouth daily.       Marland Kitchen losartan (COZAAR) 25 MG tablet TAKE ONE TABLET BY MOUTH ONCE DAILY  30 tablet  6  . metoprolol succinate (TOPROL-XL) 25 MG 24 hr tablet TAKE THREE TABLETS BY MOUTH ONCE DAILY  90 tablet  3  . nitroGLYCERIN (NITROSTAT) 0.4 MG SL tablet Place 1 tablet (0.4 mg total) under the tongue every 5 (five) minutes as needed. For chest pain  25 tablet  2  . pantoprazole (PROTONIX) 40 MG tablet Take 1 tablet (40 mg total) by mouth daily.  30 tablet  11  . polyethylene glycol (MIRALAX / GLYCOLAX) packet Take 17 g by mouth daily.       . vitamin E (VITAMIN E) 400 UNIT capsule Take 400 Units by mouth daily.      Marland Kitchen ZETIA 10 MG tablet TAKE ONE TABLET BY MOUTH ONCE DAILY  30 tablet  6    No current facility-administered medications on file prior to visit.    Allergies:  Allergies  Allergen Reactions  . Bactrim [Sulfamethoxazole-Trimethoprim] Other (See Comments)    Blisters/skin peeling, nausea  . Morphine And Related     nausea  . Prednisone     Unknown  . Sulfonamide Derivatives Other (See Comments)    Blisters/skin peeling    History   Social History  . Marital Status: Married    Spouse Name: N/A    Number of Children: N/A  . Years of Education: N/A   Occupational History  . Not on file.   Social History Main Topics  . Smoking status: Never Smoker   . Smokeless tobacco: Never Used  . Alcohol Use: Yes     Comment: occasional glass wine  . Drug Use: No  . Sexual Activity: Not on file   Other Topics Concern  . Not on file   Social History Narrative  . No narrative on file    No family history on file.   Review of Systems:  See HPI for pertinent ROS. All other ROS negative.    Physical Exam: Blood pressure 126/74, pulse 56, temperature 0 F (-17.8 C), resp. rate 18, weight 127 lb (57.607 kg)., Body mass index is 21.13 kg/(m^2). General: WNWD WF Very Pleasant. Appears in no acute distress. Head: In the middle of her posterior head there is an approximate 1 inch area that is tender to the touch. There is very slight/minimal protrusion at this area--only approx 54mm protrusion. There is no ecchymosis and no laceration. Neck: Supple. No thyromegaly. No lymphadenopathy. Lungs: Clear bilaterally to auscultation without wheezes, rales, or rhonchi. Breathing is unlabored. Heart: RRR with S1 S2. No murmurs, rubs, or gallops. Musculoskeletal:  Strength and tone normal for age. Extremities/Skin: Warm and dry.  Neuro: Alert and oriented X 3. Moves all extremities spontaneously. Gait is normal. CNII-XII grossly in tact.5/5 bilateral grip strength, bilateral upper extremity strength, bilateral lower extremity strength. Pupils are reactive to light and  equal bilaterally. Extraocular movements intact. Psych:  Responds to questions appropriately with a normal affect.     ASSESSMENT AND PLAN:  68 y.o. year old female with  1. Head injury We'll obtain CT to evaluate for possible hematoma.  I verified with her that she is on no other anticoagulant or antiplatelet therapy. Only on aspirin 81 mg. She is going to the  CT scan right now. She is not to leave there until she has spoken with me regarding the findings. We'll followup with her at that time. - CT Head Wo Contrast; Future   Signed, [facility] [place], [place], [facility] 11/09/2013 9:28 AM

## 2013-12-08 DIAGNOSIS — H2510 Age-related nuclear cataract, unspecified eye: Secondary | ICD-10-CM | POA: Diagnosis not present

## 2013-12-08 DIAGNOSIS — H43819 Vitreous degeneration, unspecified eye: Secondary | ICD-10-CM | POA: Diagnosis not present

## 2013-12-08 DIAGNOSIS — H01139 Eczematous dermatitis of unspecified eye, unspecified eyelid: Secondary | ICD-10-CM | POA: Diagnosis not present

## 2013-12-21 ENCOUNTER — Other Ambulatory Visit: Payer: Self-pay | Admitting: Family Medicine

## 2013-12-21 NOTE — Telephone Encounter (Signed)
Refill appropriate and filled per protocol. 

## 2014-01-17 ENCOUNTER — Other Ambulatory Visit: Payer: Self-pay | Admitting: Family Medicine

## 2014-02-20 ENCOUNTER — Other Ambulatory Visit: Payer: Self-pay | Admitting: Cardiology

## 2014-03-13 ENCOUNTER — Emergency Department (HOSPITAL_COMMUNITY)
Admission: EM | Admit: 2014-03-13 | Discharge: 2014-03-13 | Disposition: A | Discharge status: Home / Self Care | Payer: No Typology Code available for payment source | Attending: Emergency Medicine | Admitting: Emergency Medicine

## 2014-03-13 ENCOUNTER — Emergency Department (HOSPITAL_COMMUNITY): Payer: No Typology Code available for payment source

## 2014-03-13 ENCOUNTER — Encounter (HOSPITAL_COMMUNITY): Payer: Self-pay | Admitting: Emergency Medicine

## 2014-03-13 DIAGNOSIS — Y9389 Activity, other specified: Secondary | ICD-10-CM | POA: Insufficient documentation

## 2014-03-13 DIAGNOSIS — Z860100 Personal history of colon polyps, unspecified: Secondary | ICD-10-CM | POA: Insufficient documentation

## 2014-03-13 DIAGNOSIS — Z7982 Long term (current) use of aspirin: Secondary | ICD-10-CM | POA: Insufficient documentation

## 2014-03-13 DIAGNOSIS — I10 Essential (primary) hypertension: Secondary | ICD-10-CM | POA: Insufficient documentation

## 2014-03-13 DIAGNOSIS — S298XXA Other specified injuries of thorax, initial encounter: Secondary | ICD-10-CM | POA: Diagnosis not present

## 2014-03-13 DIAGNOSIS — Y92410 Unspecified street and highway as the place of occurrence of the external cause: Secondary | ICD-10-CM | POA: Insufficient documentation

## 2014-03-13 DIAGNOSIS — Z951 Presence of aortocoronary bypass graft: Secondary | ICD-10-CM | POA: Insufficient documentation

## 2014-03-13 DIAGNOSIS — I2510 Atherosclerotic heart disease of native coronary artery without angina pectoris: Secondary | ICD-10-CM | POA: Insufficient documentation

## 2014-03-13 DIAGNOSIS — Z79899 Other long term (current) drug therapy: Secondary | ICD-10-CM | POA: Insufficient documentation

## 2014-03-13 DIAGNOSIS — S3981XA Other specified injuries of abdomen, initial encounter: Secondary | ICD-10-CM | POA: Insufficient documentation

## 2014-03-13 DIAGNOSIS — E785 Hyperlipidemia, unspecified: Secondary | ICD-10-CM | POA: Insufficient documentation

## 2014-03-13 DIAGNOSIS — Z9861 Coronary angioplasty status: Secondary | ICD-10-CM | POA: Insufficient documentation

## 2014-03-13 DIAGNOSIS — S20219A Contusion of unspecified front wall of thorax, initial encounter: Secondary | ICD-10-CM | POA: Insufficient documentation

## 2014-03-13 DIAGNOSIS — R079 Chest pain, unspecified: Secondary | ICD-10-CM | POA: Diagnosis not present

## 2014-03-13 DIAGNOSIS — Z8659 Personal history of other mental and behavioral disorders: Secondary | ICD-10-CM | POA: Insufficient documentation

## 2014-03-13 MED ORDER — TRAMADOL HCL 50 MG PO TABS
50.0000 mg | ORAL_TABLET | Freq: Once | ORAL | Status: AC
  Administered 2014-03-13: 50 mg via ORAL
  Filled 2014-03-13: qty 1

## 2014-03-13 MED ORDER — TRAMADOL HCL 50 MG PO TABS: 50.0000 mg | ORAL_TABLET | Freq: Four times a day (QID) | ORAL | Status: DC | PRN

## 2014-03-13 NOTE — ED Notes (Signed)
Pt returned from radiology.

## 2014-03-13 NOTE — ED Notes (Signed)
Pt given water and crackers  
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
## 2014-04-05 ENCOUNTER — Telehealth: Payer: Self-pay | Admitting: Cardiology

## 2014-04-05 NOTE — Telephone Encounter (Signed)
New message     Pt received letter of Dr Percival Spanish moving to [place]---pt does not want to go to [place]---who would Dr Percival Spanish recommend she see here at the [address] office?

## 2014-04-05 NOTE — Telephone Encounter (Signed)
Pt would like to continue to be seen at the [facility] office - she would like to see Dr Acie Fredrickson.  Advised I will forward to Dr Percival Spanish for his knowledge and verify with Dr Acie Fredrickson OK with him.  Pt's husband who also like to continue his care here with Dr Acie Fredrickson.

## 2014-04-06 NOTE — Telephone Encounter (Signed)
OK with me.

## 2014-04-13 ENCOUNTER — Other Ambulatory Visit: Payer: Medicare Other

## 2014-04-13 DIAGNOSIS — Z79899 Other long term (current) drug therapy: Secondary | ICD-10-CM

## 2014-04-13 DIAGNOSIS — I10 Essential (primary) hypertension: Secondary | ICD-10-CM | POA: Diagnosis not present

## 2014-04-13 DIAGNOSIS — I2510 Atherosclerotic heart disease of native coronary artery without angina pectoris: Secondary | ICD-10-CM | POA: Diagnosis not present

## 2014-04-13 LAB — COMPLETE METABOLIC PANEL WITH GFR
ALT: 32 U/L (ref 0–35)
AST: 26 U/L (ref 0–37)
Albumin: 4.7 g/dL (ref 3.5–5.2)
Alkaline Phosphatase: 67 U/L (ref 39–117)
BUN: 13 mg/dL (ref 6–23)
CALCIUM: 9.3 mg/dL (ref 8.4–10.5)
CHLORIDE: 102 meq/L (ref 96–112)
CO2: 26 meq/L (ref 19–32)
CREATININE: 0.9 mg/dL (ref 0.50–1.10)
GFR, EST AFRICAN AMERICAN: 76 mL/min
GFR, EST NON AFRICAN AMERICAN: 66 mL/min
Glucose, Bld: 89 mg/dL (ref 70–99)
Potassium: 4.8 mEq/L (ref 3.5–5.3)
Sodium: 141 mEq/L (ref 135–145)
Total Bilirubin: 0.7 mg/dL (ref 0.2–1.2)
Total Protein: 7 g/dL (ref 6.0–8.3)

## 2014-04-13 LAB — LIPID PANEL
CHOLESTEROL: 120 mg/dL (ref 0–200)
HDL: 48 mg/dL (ref >39)
LDL Cholesterol: 45 mg/dL (ref 0–99)
Total CHOL/HDL Ratio: 2.5 Ratio
Triglycerides: 137 mg/dL (ref <150)
VLDL: 27 mg/dL (ref 0–40)

## 2014-04-16 NOTE — Telephone Encounter (Signed)
Follow Up:  Pt states she is still waiting to hear from Dr. Acie Fredrickson to see if it is ok for her to transfer to his care. I see Dr. Percival Spanish said it is ok but I do not see any documentation from Dr. Acie Fredrickson.Marland KitchenMarland Kitchen

## 2014-04-16 NOTE — Telephone Encounter (Signed)
Ok with me 

## 2014-04-16 NOTE — Telephone Encounter (Signed)
Spoke with patient and advised waiting to hear from Dr Acie Fredrickson. Will forward to [place] and Dr Acie Fredrickson

## 2014-04-17 NOTE — Telephone Encounter (Signed)
Advised patient and sent to [facility] for scheduling. Per patient no problems just needs follow

## 2014-04-19 ENCOUNTER — Encounter: Payer: Self-pay | Admitting: Family Medicine

## 2014-04-19 ENCOUNTER — Ambulatory Visit (INDEPENDENT_AMBULATORY_CARE_PROVIDER_SITE_OTHER): Payer: Medicare Other | Admitting: Family Medicine

## 2014-04-19 VITALS — BP 110/58 | HR 60 | Temp 97.0°F | Resp 12 | Ht 65.0 in | Wt 127.0 lb

## 2014-04-19 DIAGNOSIS — E785 Hyperlipidemia, unspecified: Secondary | ICD-10-CM

## 2014-04-19 DIAGNOSIS — I10 Essential (primary) hypertension: Secondary | ICD-10-CM

## 2014-04-19 DIAGNOSIS — I25810 Atherosclerosis of coronary artery bypass graft(s) without angina pectoris: Secondary | ICD-10-CM

## 2014-04-19 NOTE — Progress Notes (Signed)
Subjective:    Patient ID: Nancy [name], female    DOB: [birth year], 68 y.o.   MRN: [id number]  HPI Patient has a history of dyslipidemia, hypertension, and coronary artery disease. She is currently on Lipitor 20 mg by mouth daily. She reports severe myalgias from Lipitor. She denies any chest pain shortness of breath or dyspnea on exertion. Her blood pressure is excellent at 110/58. Her most recent lab work is listed below: Lab on 04/13/2014  Component Date Value Ref Range Status  . Sodium 04/13/2014 141  135 - 145 mEq/L Final  . Potassium 04/13/2014 4.8  3.5 - 5.3 mEq/L Final  . Chloride 04/13/2014 102  96 - 112 mEq/L Final  . CO2 04/13/2014 26  19 - 32 mEq/L Final  . Glucose, Bld 04/13/2014 89  70 - 99 mg/dL Final  . BUN 04/13/2014 13  6 - 23 mg/dL Final  . Creat 04/13/2014 0.90  0.50 - 1.10 mg/dL Final  . Total Bilirubin 04/13/2014 0.7  0.2 - 1.2 mg/dL Final  . Alkaline Phosphatase 04/13/2014 67  39 - 117 U/L Final  . AST 04/13/2014 26  0 - 37 U/L Final  . ALT 04/13/2014 32  0 - 35 U/L Final  . Total Protein 04/13/2014 7.0  6.0 - 8.3 g/dL Final  . Albumin 04/13/2014 4.7  3.5 - 5.2 g/dL Final  . Calcium 04/13/2014 9.3  8.4 - 10.5 mg/dL Final  . GFR, Est African American 04/13/2014 76   Final  . GFR, Est Non African American 04/13/2014 66   Final   Comment:                            The estimated GFR is a calculation valid for adults (>=14 years old)                          that uses the CKD-EPI algorithm to adjust for age and sex. It is                            not to be used for children, pregnant women, hospitalized patients,                             patients on dialysis, or with rapidly changing kidney function.                          According to the NKDEP, eGFR >89 is normal, 60-89 shows mild                          impairment, 30-59 shows moderate impairment, 15-29 shows severe                          impairment and <15 is ESRD.                             Marland Kitchen  Cholesterol 04/13/2014 120  0 - 200 mg/dL Final   Comment: ATP III Classification:                                < 200  mg/dL        Desirable                               200 - 239     mg/dL        Borderline High                               >= 240        mg/dL        High                             . Triglycerides 04/13/2014 137  <150 mg/dL Final  . HDL 04/13/2014 48  >39 mg/dL Final  . Total CHOL/HDL Ratio 04/13/2014 2.5   Final  . VLDL 04/13/2014 27  0 - 40 mg/dL Final  . LDL Cholesterol 04/13/2014 45  0 - 99 mg/dL Final   Comment:                            Total Cholesterol/HDL Ratio:CHD Risk                                                 Coronary Heart Disease Risk Table                                                                 Men       Women                                   1/2 Average Risk              3.4        3.3                                       Average Risk              5.0        4.4                                    2X Average Risk              9.6        7.1                                    3X Average Risk             23.4       11.0  Use the calculated Patient Ratio above and the CHD Risk table                           to determine the patient's CHD Risk.                          ATP III Classification (LDL):                                < 100        mg/dL         Optimal                               100 - 129     mg/dL         Near or Above Optimal                               130 - 159     mg/dL         Borderline High                               160 - 189     mg/dL         High                                > 190        mg/dL         Very High                              Past Medical History  Diagnosis Date  . DYSLIPIDEMIA   . Anxiety state, unspecified   . Essential hypertension, benign   . CORONARY ATHEROSCLEROSIS NATIVE CORONARY ARTERY   . CHEST PAIN UNSPECIFIED   . COLONIC POLYPS, HX OF    Past  Surgical History  Procedure Laterality Date  . Coronary artery bypass graft  2009  . Nasal sinus surgery    . Polypectomy    . 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 Coronary angioplasty with stent placement  2010    3 times   Current Outpatient Prescriptions on File Prior to Visit  Medication Sig Dispense Refill  . Alpha-D-Galactosidase (BEANO PO) Take 2-3 tablets by mouth daily. Take 2-3 pills before each meal a day      . aspirin 81 MG tablet Take 81 mg by mouth daily.       Marland Kitchen atorvastatin (LIPITOR) 20 MG tablet Take 20 mg by mouth daily.      . Cholecalciferol (VITAMIN D-3 PO) Take 1 tablet by mouth daily.       . Coenzyme Q10 (CO Q 10 PO) Take 1 capsule by mouth every other day.      . diphenhydrAMINE (BENADRYL) 25 MG tablet Take 25 mg by mouth at bedtime as needed. For sleep      .  diphenhydramine-acetaminophen (TYLENOL PM) 25-500 MG TABS Take 1 tablet by mouth at bedtime as needed. For insomnia/headache      . ezetimibe (ZETIA) 10 MG tablet Take 10 mg by mouth daily.      . Fiber CHEW Chew 1 each by mouth daily.       . fish oil-omega-3 fatty acids 1000 MG capsule Take 1 g by mouth 2 (two) times daily.       Marland Kitchen FLAXSEED, LINSEED, PO Take 1 capsule by mouth daily.       Marland Kitchen losartan (COZAAR) 25 MG tablet Take 25 mg by mouth daily.      . metoprolol succinate (TOPROL-XL) 25 MG 24 hr tablet Take 75 mg by mouth daily.      . nitroGLYCERIN (NITROSTAT) 0.4 MG SL tablet Place 1 tablet (0.4 mg total) under the tongue every 5 (five) minutes as needed. For chest pain  25 tablet  2  . polyethylene glycol (MIRALAX / GLYCOLAX) packet Take 17 g by mouth daily.       . ranitidine (ZANTAC) 150 MG tablet Take 150 mg by mouth daily.      . traMADol (ULTRAM) 50 MG tablet Take 1 tablet (50 mg total) by mouth every 6 (six)  hours as needed.  15 tablet  0  . vitamin E (VITAMIN E) 400 UNIT capsule Take 400 Units by mouth daily.       No current facility-administered medications on file prior to visit.   Allergies  Allergen Reactions  . Bactrim [Sulfamethoxazole-Trimethoprim] Other (See Comments)    Blisters/skin peeling, nausea  . Morphine And Related     nausea  . Prednisone     Unknown  . Sulfonamide Derivatives Other (See Comments)    Blisters/skin peeling   History   Social History  . Marital Status: Married    Spouse Name: N/A    Number of Children: N/A  . Years of Education: N/A   Occupational History  . Not on file.   Social History Main Topics  . Smoking status: Never Smoker   . Smokeless tobacco: Never Used  . Alcohol Use: Yes     Comment: occasional glass wine  . Drug Use: No  . Sexual Activity: Not on file   Other Topics Concern  . Not on file   Social History Narrative  . No narrative on file      Review of Systems  All other systems reviewed and are negative.      Objective:   Physical Exam  Vitals reviewed. Constitutional: She appears well-developed and well-nourished.  Neck: No JVD present.  Cardiovascular: Normal rate, regular rhythm and normal heart sounds.  Exam reveals no gallop and no friction rub.   No murmur heard. Pulmonary/Chest: Effort normal and breath sounds normal. No respiratory distress. She has no wheezes. She has no rales. She exhibits no tenderness.  Abdominal: Soft. Bowel sounds are normal. She exhibits no distension. There is no tenderness. There is no rebound and no guarding.  Musculoskeletal: She exhibits no edema.          Assessment & Plan:  Essential hypertension  Dyslipidemia  patient's cholesterol is excellent. Due to her severe myalgias, I have recommended the patient decrease Lipitor to 10 mg by mouth daily. Recheck fasting lipid panel in 6 months. Go LDL is less than 70. Continue aspirin. Blood pressures well controlled. I'll  make no further changes in her medication at this time.

## 2014-04-25 ENCOUNTER — Other Ambulatory Visit: Payer: Self-pay

## 2014-04-25 DIAGNOSIS — Z1231 Encounter for screening mammogram for malignant neoplasm of breast: Secondary | ICD-10-CM

## 2014-05-09 ENCOUNTER — Ambulatory Visit
Admission: RE | Admit: 2014-05-09 | Discharge: 2014-05-09 | Disposition: A | Discharge status: Home / Self Care | Payer: Medicare Other | Source: Ambulatory Visit

## 2014-05-09 DIAGNOSIS — Z1231 Encounter for screening mammogram for malignant neoplasm of breast: Secondary | ICD-10-CM | POA: Diagnosis not present

## 2014-05-21 ENCOUNTER — Other Ambulatory Visit: Payer: Self-pay | Admitting: Cardiology

## 2014-06-04 ENCOUNTER — Institutional Professional Consult (permissible substitution): Payer: Medicare Other | Admitting: Cardiovascular Disease

## 2014-06-08 ENCOUNTER — Encounter: Payer: Self-pay | Admitting: Gastroenterology

## 2014-06-12 DIAGNOSIS — H2510 Age-related nuclear cataract, unspecified eye: Secondary | ICD-10-CM | POA: Diagnosis not present

## 2014-06-19 ENCOUNTER — Other Ambulatory Visit: Payer: Self-pay | Admitting: Cardiology

## 2014-06-19 ENCOUNTER — Other Ambulatory Visit: Payer: Self-pay | Admitting: Cardiovascular Disease

## 2014-06-28 ENCOUNTER — Ambulatory Visit (INDEPENDENT_AMBULATORY_CARE_PROVIDER_SITE_OTHER): Payer: Medicare Other | Admitting: Cardiovascular Disease

## 2014-06-28 ENCOUNTER — Encounter: Payer: Self-pay | Admitting: Cardiovascular Disease

## 2014-06-28 VITALS — BP 110/56 | HR 56 | Ht 65.0 in | Wt 129.0 lb

## 2014-06-28 DIAGNOSIS — I25810 Atherosclerosis of coronary artery bypass graft(s) without angina pectoris: Secondary | ICD-10-CM

## 2014-06-28 DIAGNOSIS — E785 Hyperlipidemia, unspecified: Secondary | ICD-10-CM

## 2014-06-28 DIAGNOSIS — I2510 Atherosclerotic heart disease of native coronary artery without angina pectoris: Secondary | ICD-10-CM

## 2014-06-28 NOTE — Progress Notes (Signed)
Nancy [name] Date of Birth  1946/09/12       [facility] [address], [address], [place], [place]  [address]   [place], [place]  [address] [place]   Fax  [phone]     Fax [phone]  Problem List: 1. Coronary artery disease-status post coronary artery bypass graft- 2008  Status post PCI in 2010. 2. craniotomy for subdural hematoma. 20. History of hyperlipidemia- currently well controlled on medications   History of Present Illness:  Nancy [name] is doing well from a cardiac standpoint.  She is having lots of leg cramps.  Takes Tylenol PM each night for cramps.  She's tried taking mustard and this does not help.  Her atorvastatin dose has been cut in 1/2 but she continues to have joint pain, muscle ache.  She also has numbness in her feet,  Has lots of ankle, knee and hip pain   Current Outpatient Prescriptions on File Prior to Visit  Medication Sig Dispense Refill  . Alpha-D-Galactosidase (BEANO PO) Take 2-3 tablets by mouth daily. Take 2-3 pills before each meal a day      . aspirin 81 MG tablet Take 81 mg by mouth daily.       Marland Kitchen atorvastatin (LIPITOR) 20 MG tablet Take 20 mg by mouth daily. TAKING A 1/2 TAB CURRENTLY DUE TO JOINT PAIN      . Cholecalciferol (VITAMIN D-3 PO) Take 1 tablet by mouth daily.       . Coenzyme Q10 (CO Q 10 PO) Take 1 capsule by mouth every other day.      . diphenhydrAMINE (BENADRYL) 25 MG tablet Take 25 mg by mouth at bedtime as needed. For sleep      . diphenhydramine-acetaminophen (TYLENOL PM) 25-500 MG TABS Take 1 tablet by mouth at bedtime as needed. For insomnia/headache      . Fiber CHEW Chew 1 each by mouth daily.       . fish oil-omega-3 fatty acids 1000 MG capsule Take 1 g by mouth 2 (two) times daily.       Marland Kitchen FLAXSEED, LINSEED, PO Take 1 capsule by mouth daily.       Marland Kitchen losartan (COZAAR) 25 MG tablet TAKE ONE TABLET BY MOUTH ONCE DAILY  30 tablet  0    . metoprolol succinate (TOPROL-XL) 25 MG 24 hr tablet Take 75 mg by mouth daily.      . nitroGLYCERIN (NITROSTAT) 0.4 MG SL tablet Place 1 tablet (0.4 mg total) under the tongue every 5 (five) minutes as needed. For chest pain  25 tablet  2  . polyethylene glycol (MIRALAX / GLYCOLAX) packet Take 17 g by mouth daily.       . ranitidine (ZANTAC) 150 MG tablet Take 150 mg by mouth daily.      . traMADol (ULTRAM) 50 MG tablet Take 1 tablet (50 mg total) by mouth every 6 (six) hours as needed.  15 tablet  0  . vitamin E (VITAMIN E) 400 UNIT capsule Take 400 Units by mouth daily.      Marland Kitchen ZETIA 10 MG tablet TAKE ONE TABLET BY MOUTH ONCE DAILY  30 tablet  0   No current facility-administered medications on file prior to visit.    Allergies  Allergen Reactions  . Bactrim [Sulfamethoxazole-Trimethoprim] Other (See Comments)    Blisters/skin peeling, nausea  . Morphine And Related  nausea  . Prednisone     Unknown  . Sulfonamide Derivatives Other (See Comments)    Blisters/skin peeling    Past Medical History  Diagnosis Date  . DYSLIPIDEMIA   . Anxiety state, unspecified   . Essential hypertension, benign   . CORONARY ATHEROSCLEROSIS NATIVE CORONARY ARTERY   . CHEST PAIN UNSPECIFIED   . COLONIC POLYPS, HX OF     Past Surgical History  Procedure Laterality Date  . Coronary artery bypass graft  2009  . Nasal sinus surgery    . Polypectomy    . Craniotomy  04/27/2012    Procedure: CRANIOTOMY HEMATOMA EVACUATION SUBDURAL;  Surgeon: Eustace Moore, MD;  Location: [facility];  Service: Neurosurgery;  Laterality: Right;  Right Frontal Burr hole for Subdural Hematoma  . Coronary angioplasty with stent placement  2010    3 times    History  Smoking status  . Never Smoker   Smokeless tobacco  . Never Used    History  Alcohol Use  . Yes    Comment: occasional glass wine    No family history on file.  Reviw of Systems:  Reviewed in the HPI.  All other systems are  negative.  Physical Exam: Blood pressure 110/56, pulse 56, height 5\' 5"  (1.651 m), weight 129 lb (58.514 kg). Wt Readings from Last 3 Encounters:  06/28/14 129 lb (58.514 kg)  04/19/14 127 lb (57.607 kg)  03/13/14 126 lb (57.153 kg)     General: Well developed, well nourished, in no acute distress.  Head: Normocephalic, atraumatic, sclera non-icteric, mucus membranes are moist,   Neck: Supple. Carotids are 2 + without bruits. No JVD   Lungs: Clear   Heart: RR , normal S1S2, sternotomy scar.   Abdomen: Soft, non-tender, non-distended with normal bowel sounds.  Msk:  Strength and tone are normal   Extremities: No clubbing or cyanosis. No edema.  Distal pedal pulses are 2+ and equal    Neuro: CN II - XII intact.  Alert and oriented X 3.  Psych:  Normal   ECG: 06/28/2014: Sinus bradycardia 56. She has no ST or T wave changes. Has very small Q waves in the inferior leads.  Assessment / Plan:

## 2014-06-28 NOTE — Assessment & Plan Note (Signed)
Nancy [name] presents for continuation of cardiology care. She's not having any particular cardiac problems. She's having lots of joint and muscle pains which may be due to her   Atorvastatin.  We will see with the Zetia but wil stop the Atorva.  For her muscle aches and pains will result.  I will see her again in 6 months for followup visit. We'll check fasting labs at that time.

## 2014-06-28 NOTE — Patient Instructions (Signed)
Your physician recommends that you return for FASTING lab work in 6 months at [facility].   Your physician has recommended you make the following change in your medication:   1. Stop Atorvastatin.  Your physician wants you to follow-up in: 6 montsh with Dr. Acie Fredrickson. You will receive a reminder letter in the mail two months in advance. If you don't receive a letter, please call our office to schedule the follow-up appointment.

## 2014-07-19 ENCOUNTER — Other Ambulatory Visit: Payer: Self-pay | Admitting: Family Medicine

## 2014-07-19 ENCOUNTER — Other Ambulatory Visit: Payer: Self-pay | Admitting: Cardiovascular Disease

## 2014-07-19 NOTE — Telephone Encounter (Signed)
Medication refilled per protocol. 

## 2014-08-02 ENCOUNTER — Ambulatory Visit (INDEPENDENT_AMBULATORY_CARE_PROVIDER_SITE_OTHER): Payer: Medicare Other | Admitting: *Deleted

## 2014-08-02 DIAGNOSIS — Z23 Encounter for immunization: Secondary | ICD-10-CM

## 2014-08-02 NOTE — Progress Notes (Signed)
Patient ID: Nancy [name], female   DOB: 05/12/1946, 68 y.o.   MRN: [id number] Patient seen in office for Influenza Vaccination.   Tolerated IM administration well.

## 2014-08-03 ENCOUNTER — Other Ambulatory Visit: Payer: Self-pay

## 2014-09-18 ENCOUNTER — Other Ambulatory Visit: Payer: Self-pay | Admitting: Family Medicine

## 2014-09-18 ENCOUNTER — Telehealth: Payer: Self-pay | Admitting: *Deleted

## 2014-09-18 NOTE — Telephone Encounter (Signed)
MD made aware and advised that patient would need to be seen to R/O hypokalemia.   Call placed to patient and patient made aware.   Appointment scheduled.

## 2014-09-18 NOTE — Telephone Encounter (Signed)
Received call from patient.   Reports that she was taking Pravastatin for cholesterol, but noted increased fatigue and muscle pain. Dr. Carmelina Noun took patient off Pravastatin in Sept 2015. Patient is currently taking fish oil and Zetia.   Patient reports that she now has intermittent calf cramping, especially in L calf. Reports that cramping is moving up the back of thighs and is noted especially while sitting or lying down.   States that while she has intermittent cramping in her legs, she also has some chest tightness and a headache. Denies SOB or chest pain.   States that she is using mustard for cramping and it is slightly effective.   MD please advise.

## 2014-09-20 ENCOUNTER — Encounter: Payer: Self-pay | Admitting: Family Medicine

## 2014-09-20 ENCOUNTER — Ambulatory Visit (INDEPENDENT_AMBULATORY_CARE_PROVIDER_SITE_OTHER): Payer: Medicare Other | Admitting: Family Medicine

## 2014-09-20 ENCOUNTER — Other Ambulatory Visit: Payer: Self-pay | Admitting: *Deleted

## 2014-09-20 VITALS — BP 140/80 | HR 80 | Temp 98.3°F | Resp 16 | Ht 65.0 in | Wt 128.0 lb

## 2014-09-20 DIAGNOSIS — M609 Myositis, unspecified: Secondary | ICD-10-CM | POA: Diagnosis not present

## 2014-09-20 DIAGNOSIS — M791 Myalgia: Secondary | ICD-10-CM

## 2014-09-20 DIAGNOSIS — G5601 Carpal tunnel syndrome, right upper limb: Secondary | ICD-10-CM

## 2014-09-20 DIAGNOSIS — I25810 Atherosclerosis of coronary artery bypass graft(s) without angina pectoris: Secondary | ICD-10-CM | POA: Diagnosis not present

## 2014-09-20 DIAGNOSIS — M5432 Sciatica, left side: Secondary | ICD-10-CM | POA: Diagnosis not present

## 2014-09-20 DIAGNOSIS — IMO0001 Reserved for inherently not codable concepts without codable children: Secondary | ICD-10-CM

## 2014-09-20 DIAGNOSIS — G5602 Carpal tunnel syndrome, left upper limb: Secondary | ICD-10-CM | POA: Diagnosis not present

## 2014-09-20 LAB — BASIC METABOLIC PANEL WITH GFR
BUN: 14 mg/dL (ref 6–23)
CALCIUM: 9.6 mg/dL (ref 8.4–10.5)
CO2: 26 mEq/L (ref 19–32)
CREATININE: 0.84 mg/dL (ref 0.50–1.10)
Chloride: 105 mEq/L (ref 96–112)
GFR, EST AFRICAN AMERICAN: 83 mL/min
GFR, EST NON AFRICAN AMERICAN: 72 mL/min
GLUCOSE: 83 mg/dL (ref 70–99)
Potassium: 4.4 mEq/L (ref 3.5–5.3)
Sodium: 142 mEq/L (ref 135–145)

## 2014-09-20 MED ORDER — PREDNISONE 20 MG PO TABS: ORAL_TABLET | ORAL | Status: DC

## 2014-09-20 MED ORDER — NITROGLYCERIN 0.4 MG SL SUBL: 0.4000 mg | SUBLINGUAL_TABLET | SUBLINGUAL | Status: DC | PRN

## 2014-09-20 NOTE — Progress Notes (Signed)
Subjective:    Patient ID: Nancy [name], female    DOB: 1946/04/06, 68 y.o.   MRN: [id number]  HPI  Patient has been suffering from severe myalgias in both legs left greater than right. The myalgias mainly center in her hamstrings bilaterally as well as in her calves. It is worse at night. Is not accompanied by claudication. Her cardiologist recently discontinued her statin medication and the myalgias seem to be worsening. She denies any fever or rashes. She does complain of pain in her left hip. The pain radiates from her lower back and her left hip. She also complains of pain radiating down the left leg. She is also complaining of numbness and tingling in her left hand greater than her right hand. She has pain that originates over the thenar eminence in the left palm and radiates into the thumb and into the index and middle finger. It is worse with wrist flexion. She denies any neck pain or neck injury. Past Medical History  Diagnosis Date  . DYSLIPIDEMIA   . Anxiety state, unspecified   . Essential hypertension, benign   . CORONARY ATHEROSCLEROSIS NATIVE CORONARY ARTERY   . CHEST PAIN UNSPECIFIED   . COLONIC POLYPS, HX OF    Current Outpatient Prescriptions on File Prior to Visit  Medication Sig Dispense Refill  . Alpha-D-Galactosidase (BEANO PO) Take 2-3 tablets by mouth daily. Take 2-3 pills before each meal a day    . aspirin 81 MG tablet Take 81 mg by mouth daily.     . Cholecalciferol (VITAMIN D-3 PO) Take 1 tablet by mouth daily.     . Coenzyme Q10 (CO Q 10 PO) Take 1 capsule by mouth every other day.    . diphenhydrAMINE (BENADRYL) 25 MG tablet Take 25 mg by mouth at bedtime as needed. For sleep    . diphenhydramine-acetaminophen (TYLENOL PM) 25-500 MG TABS Take 1 tablet by mouth at bedtime as needed. For insomnia/headache    . Fiber CHEW Chew 1 each by mouth daily.     . fish oil-omega-3 fatty acids 1000 MG capsule Take 1 g by mouth 2 (two) times daily.     Marland Kitchen FLAXSEED,  LINSEED, PO Take 1 capsule by mouth daily.     Marland Kitchen losartan (COZAAR) 25 MG tablet TAKE ONE TABLET BY MOUTH ONCE DAILY 30 tablet 6  . metoprolol succinate (TOPROL-XL) 25 MG 24 hr tablet TAKE THREE TABLETS BY MOUTH ONCE DAILY 90 tablet 1  . nitroGLYCERIN (NITROSTAT) 0.4 MG SL tablet Place 1 tablet (0.4 mg total) under the tongue every 5 (five) minutes as needed. For chest pain 25 tablet 2  . polyethylene glycol (MIRALAX / GLYCOLAX) packet Take 17 g by mouth daily.     . ranitidine (ZANTAC) 150 MG tablet Take 150 mg by mouth daily.    . traMADol (ULTRAM) 50 MG tablet Take 1 tablet (50 mg total) by mouth every 6 (six) hours as needed. 15 tablet 0  . vitamin E (VITAMIN E) 400 UNIT capsule Take 400 Units by mouth daily.    Marland Kitchen ZETIA 10 MG tablet TAKE ONE TABLET BY MOUTH ONCE DAILY 30 tablet 6   No current facility-administered medications on file prior to visit.   Past Surgical History  Procedure Laterality Date  . Coronary artery bypass graft  2009  . Nasal sinus surgery    . Polypectomy    . Craniotomy  04/27/2012    Procedure: CRANIOTOMY HEMATOMA EVACUATION SUBDURAL;  Surgeon: Eustace Moore, MD;  Location: [facility];  Service: Neurosurgery;  Laterality: Right;  Right Frontal Burr hole for Subdural Hematoma  . Coronary angioplasty with stent placement  2010    3 times   Allergies  Allergen Reactions  . Atorvastatin Other (See Comments)    Muscle cramps  . Bactrim [Sulfamethoxazole-Trimethoprim] Other (See Comments)    Blisters/skin peeling, nausea  . Morphine And Related Nausea Only  . Prednisone Other (See Comments)    Unknown  . Sulfonamide Derivatives Other (See Comments)    Blisters/skin peeling   History   Social History  . Marital Status: Married    Spouse Name: N/A    Number of Children: N/A  . Years of Education: N/A   Occupational History  . Not on file.   Social History Main Topics  . Smoking status: Never Smoker   . Smokeless tobacco: Never Used  . Alcohol Use:  Yes     Comment: occasional glass wine  . Drug Use: No  . Sexual Activity: Not on file   Other Topics Concern  . Not on file   Social History Narrative     Review of Systems  All other systems reviewed and are negative.      Objective:   Physical Exam  Cardiovascular: Normal rate and regular rhythm.   Pulmonary/Chest: Effort normal and breath sounds normal.  Abdominal: Soft. Bowel sounds are normal.  Musculoskeletal: She exhibits tenderness.  Skin: No erythema.  Vitals reviewed.  patient has a negative straight leg raise on the right and the left. However she has pain radiating to the issue of tuberosity and down her leg. She denies any muscle tenderness to palpation in the left or right leg. She denies any pain with range of motion in the left or right hip or the left or right knee. She denies any weakness in the left or right leg. She has a positive Tinel sign and a positive Phalen sign in the left wrist.        Assessment & Plan:  Myalgia and myositis - Plan: BASIC METABOLIC PANEL WITH GFR  Sciatica, left - Plan: predniSONE (DELTASONE) 20 MG tablet  Carpal tunnel syndrome of right wrist  Carpal tunnel syndrome of left wrist  I believe the patient has multiple problems. Pertaining to her myalgias and her cramps, I will check a BMP to rule out hypokalemia. I will have the patient temporarily discontinue zetia and recheck in 2 weeks. If she continues to have a cramps, I will try the patient on chloroquine. I also believe the patient is having left-sided sciatica. Will start the patient on a prednisone taper pack. She has prednisone listed as an allergy however she states the medication only made her nauseated and she is willing to try the medication for relief of the pain in her left hip. If the pain persists consider an MRI of the lumbar spine. Regarding her carpal tunnel syndrome she would like to try the prednisone first. If it is not improving at that time, I would  recommend a cockup wrist splint as well as a cortisone injection in the left wrist. I will recheck the patient in 2 weeks.

## 2014-10-29 ENCOUNTER — Ambulatory Visit (INDEPENDENT_AMBULATORY_CARE_PROVIDER_SITE_OTHER): Payer: Medicare Other | Admitting: Family Medicine

## 2014-10-29 ENCOUNTER — Encounter: Payer: Self-pay | Admitting: Family Medicine

## 2014-10-29 VITALS — BP 124/76 | HR 68 | Temp 97.8°F | Resp 18 | Wt 128.0 lb

## 2014-10-29 DIAGNOSIS — E785 Hyperlipidemia, unspecified: Secondary | ICD-10-CM

## 2014-10-29 DIAGNOSIS — Z8679 Personal history of other diseases of the circulatory system: Secondary | ICD-10-CM

## 2014-10-29 DIAGNOSIS — M5432 Sciatica, left side: Secondary | ICD-10-CM | POA: Diagnosis not present

## 2014-10-29 DIAGNOSIS — I10 Essential (primary) hypertension: Secondary | ICD-10-CM

## 2014-10-29 DIAGNOSIS — K219 Gastro-esophageal reflux disease without esophagitis: Secondary | ICD-10-CM | POA: Diagnosis not present

## 2014-10-29 LAB — LIPID PANEL
Cholesterol: 192 mg/dL (ref 0–200)
HDL: 53 mg/dL (ref >39)
LDL Cholesterol: 116 mg/dL — ABNORMAL HIGH (ref 0–99)
Total CHOL/HDL Ratio: 3.6 Ratio
Triglycerides: 114 mg/dL (ref <150)
VLDL: 23 mg/dL (ref 0–40)

## 2014-10-29 LAB — CBC WITH DIFFERENTIAL/PLATELET
Basophils Absolute: 0.1 10*3/uL (ref 0.0–0.1)
Basophils Relative: 1 % (ref 0–1)
Eosinophils Absolute: 0.1 10*3/uL (ref 0.0–0.7)
Eosinophils Relative: 2 % (ref 0–5)
HEMATOCRIT: 41.7 % (ref 36.0–46.0)
Hemoglobin: 14.5 g/dL (ref 12.0–15.0)
LYMPHS ABS: 1.4 10*3/uL (ref 0.7–4.0)
LYMPHS PCT: 26 % (ref 12–46)
MCH: 31.7 pg (ref 26.0–34.0)
MCHC: 34.8 g/dL (ref 30.0–36.0)
MCV: 91 fL (ref 78.0–100.0)
MPV: 11.1 fL (ref 8.6–12.4)
Monocytes Absolute: 0.5 10*3/uL (ref 0.1–1.0)
Monocytes Relative: 10 % (ref 3–12)
NEUTROS ABS: 3.3 10*3/uL (ref 1.7–7.7)
Neutrophils Relative %: 61 % (ref 43–77)
Platelets: 258 10*3/uL (ref 150–400)
RBC: 4.58 MIL/uL (ref 3.87–5.11)
RDW: 13.9 % (ref 11.5–15.5)
WBC: 5.4 10*3/uL (ref 4.0–10.5)

## 2014-10-29 MED ORDER — PANTOPRAZOLE SODIUM 40 MG PO TBEC: 40.0000 mg | DELAYED_RELEASE_TABLET | Freq: Every day | ORAL | Status: DC

## 2014-10-29 NOTE — Progress Notes (Signed)
Subjective:    Patient ID: Nancy [name], female    DOB: 20-Dec-1945, 69 y.o.   MRN: [id number]  HPI Please the patient's last office visit. Patient's pains in her left leg have not improved since discontinuing Zetia. She is now off all cholesterol medication and continues to have pain. However after further discussion, the pain in her left leg is more of a numbness and tingling and burning pain with numbness in the toes. It radiates from her gluteus to her feet. She also complains of a tightness in her chest but the pain in her chest as described as a heaviness. She also reports a bitter acid taste in her mouth from time to time that is unrelieved by Zantac. After developing the tightness in her chest she will frequently developed a warm tingling sensation in her skin and began to feel lightheaded..  Patient's blood pressures well controlled. Past Medical History  Diagnosis Date  . DYSLIPIDEMIA   . Anxiety state, unspecified   . Essential hypertension, benign   . CORONARY ATHEROSCLEROSIS NATIVE CORONARY ARTERY   . CHEST PAIN UNSPECIFIED   . COLONIC POLYPS, HX OF    Past Surgical History  Procedure Laterality Date  . Coronary artery bypass graft  2009  . Nasal sinus surgery    . Polypectomy    . Craniotomy  04/27/2012    Procedure: CRANIOTOMY HEMATOMA EVACUATION SUBDURAL;  Surgeon: Eustace Moore, MD;  Location: [facility];  Service: Neurosurgery;  Laterality: Right;  Right Frontal Burr hole for Subdural Hematoma  . Coronary angioplasty with stent placement  2010    3 times   Current Outpatient Prescriptions on File Prior to Visit  Medication Sig Dispense Refill  . Alpha-D-Galactosidase (BEANO PO) Take 2-3 tablets by mouth daily. Take 2-3 pills before each meal a day    . aspirin 81 MG tablet Take 81 mg by mouth daily.     . Cholecalciferol (VITAMIN D-3 PO) Take 1 tablet by mouth daily.     . Coenzyme Q10 (CO Q 10 PO) Take 1 capsule by mouth every other day.    . diphenhydrAMINE  (BENADRYL) 25 MG tablet Take 25 mg by mouth at bedtime as needed. For sleep    . diphenhydramine-acetaminophen (TYLENOL PM) 25-500 MG TABS Take 1 tablet by mouth at bedtime as needed. For insomnia/headache    . Fiber CHEW Chew 1 each by mouth daily.     . fish oil-omega-3 fatty acids 1000 MG capsule Take 1 g by mouth 2 (two) times daily.     Marland Kitchen losartan (COZAAR) 25 MG tablet TAKE ONE TABLET BY MOUTH ONCE DAILY 30 tablet 6  . metoprolol succinate (TOPROL-XL) 25 MG 24 hr tablet TAKE THREE TABLETS BY MOUTH ONCE DAILY 90 tablet 1  . nitroGLYCERIN (NITROSTAT) 0.4 MG SL tablet Place 1 tablet (0.4 mg total) under the tongue every 5 (five) minutes as needed. For chest pain 25 tablet 3  . polyethylene glycol (MIRALAX / GLYCOLAX) packet Take 17 g by mouth daily.     . ranitidine (ZANTAC) 150 MG tablet Take 150 mg by mouth daily.    . vitamin E (VITAMIN E) 400 UNIT capsule Take 400 Units by mouth daily.    Marland Kitchen FLAXSEED, LINSEED, PO Take 1 capsule by mouth daily.     Marland Kitchen ZETIA 10 MG tablet TAKE ONE TABLET BY MOUTH ONCE DAILY (Patient not taking: Reported on 10/29/2014) 30 tablet 6   No current facility-administered medications on file prior to  visit.   Allergies  Allergen Reactions  . Atorvastatin Other (See Comments)    Muscle cramps  . Bactrim [Sulfamethoxazole-Trimethoprim] Other (See Comments)    Blisters/skin peeling, nausea  . Morphine And Related Nausea Only  . Prednisone Other (See Comments)    Unknown  . Sulfonamide Derivatives Other (See Comments)    Blisters/skin peeling   History   Social History  . Marital Status: Married    Spouse Name: N/A    Number of Children: N/A  . Years of Education: N/A   Occupational History  . Not on file.   Social History Main Topics  . Smoking status: Never Smoker   . Smokeless tobacco: Never Used  . Alcohol Use: Yes     Comment: occasional glass wine  . Drug Use: No  . Sexual Activity: Not on file   Other Topics Concern  . Not on file   Social  History Narrative      Review of Systems  All other systems reviewed and are negative.      Objective:   Physical Exam  Constitutional: She is oriented to person, place, and time.  Cardiovascular: Normal rate, regular rhythm and normal heart sounds.   No murmur heard. Pulmonary/Chest: Effort normal and breath sounds normal. No respiratory distress. She has no wheezes. She has no rales.  Abdominal: Soft. Bowel sounds are normal. She exhibits no distension and no mass. There is no tenderness. There is no rebound and no guarding.  Musculoskeletal: Normal range of motion. She exhibits no edema or tenderness.  Neurological: She is alert and oriented to person, place, and time. She has normal reflexes. She displays normal reflexes. No cranial nerve deficit. She exhibits normal muscle tone. Coordination normal.  Vitals reviewed.         Assessment & Plan:  Essential hypertension - Plan: CBC with Differential  Dyslipidemia - Plan: Lipid panel  History of ASCVD - Plan: CBC with Differential  Gastroesophageal reflux disease without esophagitis - Plan: pantoprazole (PROTONIX) 40 MG tablet  Left sided sciatica - Plan: MR Lumbar Spine Wo Contrast  Patient's blood pressures well controlled. I will make no changes in her medication at this time. I would like the patient to resume present. Given her history of coronary artery disease. Her goal LDL cholesterol is less than 70. I believe the patient's pain in her left leg is likely due to sciatica. I will schedule the patient for an MRI of the lumbar spine and if nerve impingement is indeed found, I will refer the patient to [facility] for possible epidural steroid injections.  The tightness in her chest I believe is multifactorial. Some of her symptoms sound like GERD. Therefore I will have the patient discontinue Zantac and replace it with Protonix 40 mg by mouth daily. Some of her symptoms sound like it may be anxiety or panic  attacks. I will treat her sciatica and her GERD adequately first and if there are residual symptoms began treating her anxiety.

## 2014-11-02 ENCOUNTER — Telehealth: Payer: Self-pay | Admitting: Family Medicine

## 2014-11-02 NOTE — Telephone Encounter (Signed)
-----   Message from Susy Frizzle, MD sent at 10/30/2014  7:32 AM EST ----- Please resume zetia.  Otherwise, labs are nml.

## 2014-11-02 NOTE — Telephone Encounter (Signed)
Pt aware of lab results and provider recommendations 

## 2014-11-03 ENCOUNTER — Other Ambulatory Visit: Payer: Self-pay | Admitting: Family Medicine

## 2014-11-07 ENCOUNTER — Ambulatory Visit
Admission: RE | Admit: 2014-11-07 | Discharge: 2014-11-07 | Disposition: A | Discharge status: Home / Self Care | Payer: Medicare Other | Source: Ambulatory Visit | Attending: Family Medicine | Admitting: Family Medicine

## 2014-11-07 DIAGNOSIS — M4856XA Collapsed vertebra, not elsewhere classified, lumbar region, initial encounter for fracture: Secondary | ICD-10-CM | POA: Diagnosis not present

## 2014-11-07 DIAGNOSIS — M5136 Other intervertebral disc degeneration, lumbar region: Secondary | ICD-10-CM | POA: Diagnosis not present

## 2014-11-07 DIAGNOSIS — M5432 Sciatica, left side: Secondary | ICD-10-CM

## 2014-11-07 DIAGNOSIS — M4806 Spinal stenosis, lumbar region: Secondary | ICD-10-CM | POA: Diagnosis not present

## 2014-11-09 ENCOUNTER — Other Ambulatory Visit: Payer: Medicare Other

## 2014-11-19 ENCOUNTER — Encounter: Payer: Self-pay | Admitting: Family Medicine

## 2014-11-21 ENCOUNTER — Other Ambulatory Visit: Payer: Self-pay | Admitting: Family Medicine

## 2014-11-27 DIAGNOSIS — N281 Cyst of kidney, acquired: Secondary | ICD-10-CM | POA: Diagnosis not present

## 2014-12-11 ENCOUNTER — Encounter: Payer: Self-pay | Admitting: Family Medicine

## 2014-12-11 ENCOUNTER — Ambulatory Visit (INDEPENDENT_AMBULATORY_CARE_PROVIDER_SITE_OTHER): Payer: Medicare Other | Admitting: Family Medicine

## 2014-12-11 VITALS — BP 118/56 | HR 66 | Temp 97.8°F | Resp 16 | Ht 65.0 in | Wt 132.0 lb

## 2014-12-11 DIAGNOSIS — S32030A Wedge compression fracture of third lumbar vertebra, initial encounter for closed fracture: Secondary | ICD-10-CM

## 2014-12-11 DIAGNOSIS — R5382 Chronic fatigue, unspecified: Secondary | ICD-10-CM

## 2014-12-11 DIAGNOSIS — E038 Other specified hypothyroidism: Secondary | ICD-10-CM | POA: Diagnosis not present

## 2014-12-11 MED ORDER — HYDROCODONE-ACETAMINOPHEN 5-325 MG PO TABS: 1.0000 | ORAL_TABLET | Freq: Four times a day (QID) | ORAL | Status: DC | PRN

## 2014-12-11 NOTE — Progress Notes (Signed)
Subjective:    Patient ID: Nancy [name], female    DOB: 14-Feb-1946, 69 y.o.   MRN: [id number]  HPI Patient continues to have low back pain. There is no radiation of the pain. MRI from January revealed: Mild compression fracture L3 with bone marrow edema suggesting healing fracture. Correlate with any recent injury. This is most likely a benign fracture. If pain progresses, or there is no history of injury, postcontrast imaging suggested to rule out tumor.  5 mm anterior slip L3-4 with mild spinal stenosis.  Mild spinal stenosis and subarticular stenosis bilaterally L4-5.  Patient also complains of fatigue and anxiety. She had a stress test that was normal in 2014. Lab work has been repeatedly normal. I have not checked a thyroid test. Past Medical History  Diagnosis Date  . DYSLIPIDEMIA   . Anxiety state, unspecified   . Essential hypertension, benign   . CORONARY ATHEROSCLEROSIS NATIVE CORONARY ARTERY   . CHEST PAIN UNSPECIFIED   . COLONIC POLYPS, HX OF    Past Surgical History  Procedure Laterality Date  . Coronary artery bypass graft  2009  . Nasal sinus surgery    . Polypectomy    . Craniotomy  04/27/2012    Procedure: CRANIOTOMY HEMATOMA EVACUATION SUBDURAL;  Surgeon: Eustace Moore, MD;  Location: [facility];  Service: Neurosurgery;  Laterality: Right;  Right Frontal Burr hole for Subdural Hematoma  . Coronary angioplasty with stent placement  2010    3 times   Current Outpatient Prescriptions on File Prior to Visit  Medication Sig Dispense Refill  . Alpha-D-Galactosidase (BEANO PO) Take 2-3 tablets by mouth daily. Take 2-3 pills before each meal a day    . aspirin 81 MG tablet Take 81 mg by mouth daily.     Marland Kitchen atorvastatin (LIPITOR) 20 MG tablet TAKE ONE TABLET BY MOUTH ONCE DAILY 90 tablet 2  . Cholecalciferol (VITAMIN D-3 PO) Take 1 tablet by mouth daily.     . Coenzyme Q10 (CO Q 10 PO) Take 1 capsule by mouth every other day.    . diphenhydrAMINE (BENADRYL)  25 MG tablet Take 25 mg by mouth at bedtime as needed. For sleep    . diphenhydramine-acetaminophen (TYLENOL PM) 25-500 MG TABS Take 1 tablet by mouth at bedtime as needed. For insomnia/headache    . Fiber CHEW Chew 1 each by mouth daily.     . fish oil-omega-3 fatty acids 1000 MG capsule Take 1 g by mouth 2 (two) times daily.     Marland Kitchen FLAXSEED, LINSEED, PO Take 1 capsule by mouth daily.     Marland Kitchen losartan (COZAAR) 25 MG tablet TAKE ONE TABLET BY MOUTH ONCE DAILY 30 tablet 6  . metoprolol succinate (TOPROL-XL) 25 MG 24 hr tablet TAKE THREE TABLETS BY MOUTH ONCE DAILY 90 tablet 3  . nitroGLYCERIN (NITROSTAT) 0.4 MG SL tablet Place 1 tablet (0.4 mg total) under the tongue every 5 (five) minutes as needed. For chest pain 25 tablet 3  . pantoprazole (PROTONIX) 40 MG tablet Take 1 tablet (40 mg total) by mouth daily. 30 tablet 3  . polyethylene glycol (MIRALAX / GLYCOLAX) packet Take 17 g by mouth daily.     . ranitidine (ZANTAC) 150 MG tablet Take 150 mg by mouth daily.    . vitamin E (VITAMIN E) 400 UNIT capsule Take 400 Units by mouth daily.    Marland Kitchen ZETIA 10 MG tablet TAKE ONE TABLET BY MOUTH ONCE DAILY 30 tablet 6   No  current facility-administered medications on file prior to visit.   Allergies  Allergen Reactions  . Atorvastatin Other (See Comments)    Muscle cramps  . Bactrim [Sulfamethoxazole-Trimethoprim] Other (See Comments)    Blisters/skin peeling, nausea  . Morphine And Related Nausea Only  . Prednisone Other (See Comments)    Unknown  . Sulfonamide Derivatives Other (See Comments)    Blisters/skin peeling   History   Social History  . Marital Status: Married    Spouse Name: N/A  . Number of Children: N/A  . Years of Education: N/A   Occupational History  . Not on file.   Social History Main Topics  . Smoking status: Never Smoker   . Smokeless tobacco: Never Used  . Alcohol Use: Yes     Comment: occasional glass wine  . Drug Use: No  . Sexual Activity: Not on file   Other  Topics Concern  . Not on file   Social History Narrative      Review of Systems  All other systems reviewed and are negative.      Objective:   Physical Exam  Cardiovascular: Normal rate, regular rhythm and normal heart sounds.   No murmur heard. Pulmonary/Chest: Effort normal and breath sounds normal. No respiratory distress. She has no wheezes. She has no rales. She exhibits no tenderness.  Musculoskeletal: She exhibits no edema or tenderness.       Lumbar back: She exhibits decreased range of motion and pain. She exhibits no tenderness and no spasm.  Vitals reviewed.         Assessment & Plan:  Compression fracture of L3 lumbar vertebra, closed, initial encounter  Chronic fatigue - Plan: TSH  We discussed options for her low back pain including symptomatic relief with pain medication and tincture of time versus referral for kyphoplasty. Patient elects to treat symptomatically with hydrocodone/acetaminophen 5/325 one by mouth every 6 hours when necessary pain. Given her fatigue I will check a TSH however I continue to believe that her fatigue and shortness of breath is likely anxiety related. Consider an SSRI in the future if they persist if the TSH is normal

## 2014-12-12 LAB — TSH: TSH: 2.578 u[IU]/mL (ref 0.350–4.500)

## 2014-12-20 ENCOUNTER — Telehealth: Payer: Self-pay | Admitting: Family Medicine

## 2014-12-20 NOTE — Telephone Encounter (Signed)
[phone] Please call this number instead

## 2014-12-20 NOTE — Telephone Encounter (Signed)
Patient would like to speak with you regarding the medication she "refused" she has changed her mind  [phone]

## 2014-12-21 NOTE — Telephone Encounter (Signed)
LMTRC

## 2015-01-01 ENCOUNTER — Ambulatory Visit (INDEPENDENT_AMBULATORY_CARE_PROVIDER_SITE_OTHER): Payer: Medicare Other | Admitting: Family Medicine

## 2015-01-01 ENCOUNTER — Encounter: Payer: Self-pay | Admitting: Family Medicine

## 2015-01-01 VITALS — BP 104/58 | HR 58 | Temp 97.7°F | Resp 14 | Ht 65.0 in | Wt 130.0 lb

## 2015-01-01 DIAGNOSIS — Z124 Encounter for screening for malignant neoplasm of cervix: Secondary | ICD-10-CM

## 2015-01-01 NOTE — Progress Notes (Signed)
Subjective:    Patient ID: Nancy [name], female    DOB: 1946/03/15, 69 y.o.   MRN: [id number]  HPI Patient is here today to have a Pap smear performed. We also discussed her last office visit. I believe the patient is suffering anxiety attacks which is causing her chest pain and shortness of breath. Please see the last office visit area and at the present time the patient is not interested in taking Effexor for stress and anxiety. She would like something she could use on an as-needed basis to help her sleep at night. She denies any vaginal bleeding. She denies any pain with intercourse. Past Medical History  Diagnosis Date  . DYSLIPIDEMIA   . Anxiety state, unspecified   . Essential hypertension, benign   . CORONARY ATHEROSCLEROSIS NATIVE CORONARY ARTERY   . CHEST PAIN UNSPECIFIED   . COLONIC POLYPS, HX OF    Past Surgical History  Procedure Laterality Date  . Coronary artery bypass graft  2009  . Nasal sinus surgery    . Polypectomy    . Craniotomy  04/27/2012    Procedure: CRANIOTOMY HEMATOMA EVACUATION SUBDURAL;  Surgeon: Eustace Moore, MD;  Location: [facility];  Service: Neurosurgery;  Laterality: Right;  Right Frontal Burr hole for Subdural Hematoma  . Coronary angioplasty with stent placement  2010    3 times   Current Outpatient Prescriptions on File Prior to Visit  Medication Sig Dispense Refill  . Alpha-D-Galactosidase (BEANO PO) Take 2-3 tablets by mouth daily. Take 2-3 pills before each meal a day    . aspirin 81 MG tablet Take 81 mg by mouth daily.     Marland Kitchen atorvastatin (LIPITOR) 20 MG tablet TAKE ONE TABLET BY MOUTH ONCE DAILY 90 tablet 2  . Cholecalciferol (VITAMIN D-3 PO) Take 1 tablet by mouth daily.     . Coenzyme Q10 (CO Q 10 PO) Take 1 capsule by mouth every other day.    . diphenhydrAMINE (BENADRYL) 25 MG tablet Take 25 mg by mouth at bedtime as needed. For sleep    . diphenhydramine-acetaminophen (TYLENOL PM) 25-500 MG TABS Take 1 tablet by mouth at bedtime  as needed. For insomnia/headache    . Fiber CHEW Chew 1 each by mouth daily.     . fish oil-omega-3 fatty acids 1000 MG capsule Take 1 g by mouth 2 (two) times daily.     Marland Kitchen FLAXSEED, LINSEED, PO Take 1 capsule by mouth daily.     Marland Kitchen HYDROcodone-acetaminophen (NORCO) 5-325 MG per tablet Take 1 tablet by mouth every 6 (six) hours as needed for moderate pain. 30 tablet 0  . losartan (COZAAR) 25 MG tablet TAKE ONE TABLET BY MOUTH ONCE DAILY 30 tablet 6  . metoprolol succinate (TOPROL-XL) 25 MG 24 hr tablet TAKE THREE TABLETS BY MOUTH ONCE DAILY 90 tablet 3  . nitroGLYCERIN (NITROSTAT) 0.4 MG SL tablet Place 1 tablet (0.4 mg total) under the tongue every 5 (five) minutes as needed. For chest pain 25 tablet 3  . pantoprazole (PROTONIX) 40 MG tablet Take 1 tablet (40 mg total) by mouth daily. 30 tablet 3  . polyethylene glycol (MIRALAX / GLYCOLAX) packet Take 17 g by mouth daily.     . ranitidine (ZANTAC) 150 MG tablet Take 150 mg by mouth daily.    . vitamin E (VITAMIN E) 400 UNIT capsule Take 400 Units by mouth daily.    Marland Kitchen ZETIA 10 MG tablet TAKE ONE TABLET BY MOUTH ONCE DAILY 30 tablet  6   No current facility-administered medications on file prior to visit.   Allergies  Allergen Reactions  . Atorvastatin Other (See Comments)    Muscle cramps  . Bactrim [Sulfamethoxazole-Trimethoprim] Other (See Comments)    Blisters/skin peeling, nausea  . Morphine And Related Nausea Only  . Prednisone Other (See Comments)    Unknown  . Sulfonamide Derivatives Other (See Comments)    Blisters/skin peeling   History   Social History  . Marital Status: Married    Spouse Name: N/A  . Number of Children: N/A  . Years of Education: N/A   Occupational History  . Not on file.   Social History Main Topics  . Smoking status: Never Smoker   . Smokeless tobacco: Never Used  . Alcohol Use: Yes     Comment: occasional glass wine  . Drug Use: No  . Sexual Activity: Not on file   Other Topics Concern  .  Not on file   Social History Narrative      Review of Systems  All other systems reviewed and are negative.      Objective:   Physical Exam  Cardiovascular: Normal rate, regular rhythm and normal heart sounds.   Pulmonary/Chest: Effort normal and breath sounds normal.  Abdominal: Hernia confirmed negative in the right inguinal area and confirmed negative in the left inguinal area.  Genitourinary: There is no rash, tenderness, lesion or injury on the right labia. There is no rash, tenderness, lesion or injury on the left labia. No tenderness in the vagina. No signs of injury around the vagina. No vaginal discharge found.  Lymphadenopathy:       Right: No inguinal adenopathy present.       Left: No inguinal adenopathy present.  Vitals reviewed.  patient has no cervical motion tenderness. She has no adnexal masses. There are no palpable uterine masses. She does have a prolapsed bladder        Assessment & Plan:  Papanicolaou smear - Plan: PAP, ThinPrep, Imaging, Medicare  Pap smear was sent to pathology. The remainder of her exam is normal. I did recommend she try Unisom at night to help her sleep

## 2015-01-02 LAB — PAP, THIN PREP, IMAGING, MEDICARE

## 2015-01-03 ENCOUNTER — Encounter: Payer: Self-pay | Admitting: Cardiovascular Disease

## 2015-01-03 ENCOUNTER — Other Ambulatory Visit (INDEPENDENT_AMBULATORY_CARE_PROVIDER_SITE_OTHER): Payer: Medicare Other | Admitting: *Deleted

## 2015-01-03 ENCOUNTER — Ambulatory Visit (INDEPENDENT_AMBULATORY_CARE_PROVIDER_SITE_OTHER): Payer: Medicare Other | Admitting: Cardiovascular Disease

## 2015-01-03 VITALS — BP 120/50 | HR 55 | Ht 65.0 in | Wt 128.4 lb

## 2015-01-03 DIAGNOSIS — I2510 Atherosclerotic heart disease of native coronary artery without angina pectoris: Secondary | ICD-10-CM

## 2015-01-03 DIAGNOSIS — E785 Hyperlipidemia, unspecified: Secondary | ICD-10-CM | POA: Diagnosis not present

## 2015-01-03 LAB — HEPATIC FUNCTION PANEL
ALT: 31 U/L (ref 0–35)
AST: 29 U/L (ref 0–37)
Albumin: 4.6 g/dL (ref 3.5–5.2)
Alkaline Phosphatase: 61 U/L (ref 39–117)
Bilirubin, Direct: 0.2 mg/dL (ref 0.0–0.3)
Total Bilirubin: 0.7 mg/dL (ref 0.2–1.2)
Total Protein: 7.2 g/dL (ref 6.0–8.3)

## 2015-01-03 LAB — BASIC METABOLIC PANEL
BUN: 15 mg/dL (ref 6–23)
CALCIUM: 9.7 mg/dL (ref 8.4–10.5)
CO2: 31 meq/L (ref 19–32)
CREATININE: 0.93 mg/dL (ref 0.40–1.20)
Chloride: 108 mEq/L (ref 96–112)
GFR: 63.55 mL/min (ref >60.00)
GLUCOSE: 88 mg/dL (ref 70–99)
POTASSIUM: 5 meq/L (ref 3.5–5.1)
Sodium: 143 mEq/L (ref 135–145)

## 2015-01-03 LAB — LIPID PANEL
CHOL/HDL RATIO: 2
CHOLESTEROL: 109 mg/dL (ref 0–200)
HDL: 43.8 mg/dL (ref >39.00)
LDL CALC: 45 mg/dL (ref 0–99)
NONHDL: 65.2
Triglycerides: 99 mg/dL (ref 0.0–149.0)
VLDL: 19.8 mg/dL (ref 0.0–40.0)

## 2015-01-03 MED ORDER — ROSUVASTATIN CALCIUM 10 MG PO TABS: 10.0000 mg | ORAL_TABLET | Freq: Every day | ORAL | Status: DC

## 2015-01-03 NOTE — Addendum Note (Signed)
Addended by: Eulis Foster on: 01/03/2015 10:04 AM   Modules accepted: Orders

## 2015-01-03 NOTE — Progress Notes (Signed)
Cardiology Office Note   Date:  01/03/2015   ID:  Nancy [name], Nancy [name] 1946-01-03, MRN [id number]  PCP:  Odette Fraction, MD  Cardiologist:   Thayer Headings, MD   Chief Complaint  Patient presents with  . Coronary Artery Disease   1. Coronary artery disease-status post coronary artery bypass graft- 2008 Status post PCI in 2010. 2. craniotomy for subdural hematoma. 20.  hyperlipidemia-    History of Present Illness:  Nancy [name] is doing well from a cardiac standpoint.  She is having lots of leg cramps. Takes Tylenol PM each night for cramps. She's tried taking mustard and this does not help. Her atorvastatin dose has been cut in 1/2 but she continues to have joint pain, muscle ache. She also has numbness in her feet,  Has lots of ankle, knee and hip pain    January 03, 2015:   Nancy [name] is a 69 y.o. female who presents for follow-up of her coronary artery disease.    Past Medical History  Diagnosis Date  . DYSLIPIDEMIA   . Anxiety state, unspecified   . Essential hypertension, benign   . CORONARY ATHEROSCLEROSIS NATIVE CORONARY ARTERY   . CHEST PAIN UNSPECIFIED   . COLONIC POLYPS, HX OF     Past Surgical History  Procedure Laterality Date  . Coronary artery bypass graft  2009  . Nasal sinus surgery    . Polypectomy    . Craniotomy  04/27/2012    Procedure: CRANIOTOMY HEMATOMA EVACUATION SUBDURAL;  Surgeon: Eustace Moore, MD;  Location: [facility];  Service: Neurosurgery;  Laterality: Right;  Right Frontal Burr hole for Subdural Hematoma  . Coronary angioplasty with stent placement  2010    3 times     Current Outpatient Prescriptions  Medication Sig Dispense Refill  . aspirin 81 MG tablet Take 81 mg by mouth daily.     Marland Kitchen atorvastatin (LIPITOR) 20 MG tablet TAKE ONE TABLET BY MOUTH ONCE DAILY 90 tablet 2  . Cholecalciferol (VITAMIN D-3 PO) Take 1 tablet by mouth daily.     . Coenzyme Q10 (CO Q 10 PO) Take 1 capsule by mouth every  other day.    . diphenhydrAMINE (BENADRYL) 25 MG tablet Take 25 mg by mouth at bedtime as needed. For sleep    . diphenhydramine-acetaminophen (TYLENOL PM) 25-500 MG TABS Take 1 tablet by mouth at bedtime as needed. For insomnia/headache    . Fiber CHEW Chew 1 each by mouth daily.     . fish oil-omega-3 fatty acids 1000 MG capsule Take 1 g by mouth 2 (two) times daily.     Marland Kitchen FLAXSEED, LINSEED, PO Take 1 capsule by mouth daily.     Marland Kitchen losartan (COZAAR) 25 MG tablet TAKE ONE TABLET BY MOUTH ONCE DAILY 30 tablet 6  . metoprolol succinate (TOPROL-XL) 25 MG 24 hr tablet TAKE THREE TABLETS BY MOUTH ONCE DAILY 90 tablet 3  . nitroGLYCERIN (NITROSTAT) 0.4 MG SL tablet Place 1 tablet (0.4 mg total) under the tongue every 5 (five) minutes as needed. For chest pain 25 tablet 3  . pantoprazole (PROTONIX) 40 MG tablet Take 1 tablet (40 mg total) by mouth daily. 30 tablet 3  . polyethylene glycol (MIRALAX / GLYCOLAX) packet Take 17 g by mouth daily.     . vitamin E (VITAMIN E) 400 UNIT capsule Take 400 Units by mouth daily.    Marland Kitchen ZETIA 10 MG tablet TAKE ONE TABLET BY MOUTH ONCE DAILY 30 tablet 6  No current facility-administered medications for this visit.    Allergies:   Atorvastatin; Bactrim; Morphine and related; Prednisone; and Sulfonamide derivatives    Social History:  The patient  reports that she has never smoked. She has never used smokeless tobacco. She reports that she drinks alcohol. She reports that she does not use illicit drugs.   Family History:  The patient's family history includes Hypertension in her brother and mother.    ROS:  Please see the history of present illness.    Review of Systems: Constitutional:  denies fever, chills, diaphoresis, appetite change and fatigue.  HEENT: denies photophobia, eye pain, redness, hearing loss, ear pain, congestion, sore throat, rhinorrhea, sneezing, neck pain, neck stiffness and tinnitus.  Respiratory: denies SOB, DOE, cough, chest tightness,  and wheezing.  Cardiovascular: denies chest pain, palpitations and leg swelling.  Gastrointestinal: denies nausea, vomiting, abdominal pain, diarrhea, constipation, blood in stool.  Genitourinary: denies dysuria, urgency, frequency, hematuria, flank pain and difficulty urinating.  Musculoskeletal: denies  myalgias, back pain, joint swelling, arthralgias and gait problem.   Skin: denies pallor, rash and wound.  Neurological: denies dizziness, seizures, syncope, weakness, light-headedness, numbness and headaches.   Hematological: denies adenopathy, easy bruising, personal or family bleeding history.  Psychiatric/ Behavioral: denies suicidal ideation, mood changes, confusion, nervousness, sleep disturbance and agitation.       All other systems are reviewed and negative.    PHYSICAL EXAM: VS:  BP 120/50 mmHg  Pulse 55  Ht 5\' 5"  (1.651 m)  Wt 128 lb 6.4 oz (58.242 kg)  BMI 21.37 kg/m2  SpO2 99% , BMI Body mass index is 21.37 kg/(m^2). GEN: Well nourished, well developed, in no acute distress HEENT: normal Neck: no JVD, carotid bruits, or masses Cardiac: RRR; no murmurs, rubs, or gallops,no edema  Respiratory:  clear to auscultation bilaterally, normal work of breathing GI: soft, nontender, nondistended, + BS MS: no deformity or atrophy Skin: warm and dry, no rash Neuro:  Strength and sensation are intact Psych: normal   EKG:  EKG is not ordered today.    Recent Labs: 04/13/2014: ALT 32 09/20/2014: BUN 14; Creatinine 0.84; Potassium 4.4; Sodium 142 10/29/2014: Hemoglobin 14.5; Platelets 258 12/11/2014: TSH 2.578    Lipid Panel    Component Value Date/Time   CHOL 192 10/29/2014 0816   TRIG 114 10/29/2014 0816   HDL 53 10/29/2014 0816   CHOLHDL 3.6 10/29/2014 0816   VLDL 23 10/29/2014 0816   LDLCALC 116* 10/29/2014 0816      Wt Readings from Last 3 Encounters:  01/03/15 128 lb 6.4 oz (58.242 kg)  01/01/15 130 lb (58.968 kg)  12/11/14 132 lb (59.875 kg)      Other  studies Reviewed: Additional studies/ records that were reviewed today include: . Review of the above records demonstrates:    ASSESSMENT AND PLAN:  1. Coronary artery disease-status post coronary artery bypass graft- 2008 Status post PCI in 2010. , She is doing well. She's not having any episodes of angina.  2. craniotomy for subdural hematoma.  3.   hyperlipidemia- she's been on low-dose atorvastatin but she has severe myalgias and muscle cramps. We'll have her hold her atorvastatin for several weeks and then start her on Crestor.    Current medicines are reviewed at length with the patient today.  The patient does not have concerns regarding medicines.  The following changes have been made:  no change  Labs/ tests ordered today include:  No orders of the defined types were placed  in this encounter.     Disposition:   FU with me in 6 months.  Fasting labs in 3 months     Signed, Stevenson Windmiller, Wonda Cheng, MD  01/03/2015 9:00 AM    [facility] [place], [place], [place]  [address] Phone: [phone]; Fax: [phone]

## 2015-01-03 NOTE — Patient Instructions (Addendum)
Your physician has recommended you make the following change in your medication:  STOP Atorvastatin START Crestor 10 mg once daily IN ABOUT 2-3 WEEKS OR UNTIL MUSCLE ACHES ARE GONE  Your physician recommends that you return for lab work in: 3 months after starting Crestor for fasting cholesterol, liver, bmet (Call Christen Bame, RN [phone] to schedule appointment) You will need to fast for this appointment (nothing but water after midnight)  Your physician wants you to follow-up in: 6 months with Dr. Acie Fredrickson.  You will receive a reminder letter in the mail two months in advance. If you don't receive a letter, please call our office to schedule the follow-up appointment.

## 2015-01-04 ENCOUNTER — Telehealth: Payer: Self-pay | Admitting: Nurse Practitioner

## 2015-01-04 DIAGNOSIS — R079 Chest pain, unspecified: Secondary | ICD-10-CM

## 2015-01-04 NOTE — Telephone Encounter (Signed)
Called patient to review lab results.  Patient reports that she forgot to report to Dr. Acie Fredrickson at her office visit yesterday that she is waking up during the night with chest pain and sweating; reports these episodes have occurred for 3-4 months intermittently but are becoming more frequent.  Patient has hx of CAD with CABG in approximately 2008.  I advised patient that with her history Dr. Acie Fredrickson, who is in the office today, would like to order an exercise stress myoview.   Patient verbalized understanding and agreement.  I reviewed pre-test instructions with patient including to hold Toprol for 24 hours.  I advised patient that someone from our office will call her Monday to schedule the test.  Patient verbalized understanding and agreement.

## 2015-01-15 NOTE — Telephone Encounter (Signed)
Discussed at OV

## 2015-01-16 ENCOUNTER — Ambulatory Visit (HOSPITAL_COMMUNITY): Payer: Medicare Other | Attending: Cardiovascular Disease | Admitting: Radiology

## 2015-01-16 DIAGNOSIS — R079 Chest pain, unspecified: Secondary | ICD-10-CM | POA: Insufficient documentation

## 2015-01-16 MED ORDER — TECHNETIUM TC 99M SESTAMIBI GENERIC - CARDIOLITE
11.0000 | Freq: Once | INTRAVENOUS | Status: AC | PRN
  Administered 2015-01-16: 11 via INTRAVENOUS

## 2015-01-16 MED ORDER — TECHNETIUM TC 99M SESTAMIBI GENERIC - CARDIOLITE
33.0000 | Freq: Once | INTRAVENOUS | Status: AC | PRN
  Administered 2015-01-16: 33 via INTRAVENOUS

## 2015-01-16 NOTE — Progress Notes (Signed)
[facility] [address] [place], [place] [address] [phone]    Cardiology Nuclear Med Study  Nancy [name] is a 69 y.o. female     MRN : [id number]     DOB: 19-Jun-1946  Procedure Date: 01/16/2015  Nuclear Med Background Indication for Stress Test:  Evaluation for Ischemia History:  CAD, '12 MPI: EF: 83% NL Cardiac Risk Factors: Hypertension and Lipids  Symptoms:  Chest Pressure.  (last date of chest discomfort 01/15/2015 in the middle of the night), Doe, Palpitations, Fatigue   Nuclear Pre-Procedure Caffeine/Decaff Intake:  None NPO After: 7:30pm   Lungs:  clear O2 Sat: 99% on room air. IV 0.9% NS with Angio Cath:  22g  IV Site: L Antecubital  IV Started by:  Perrin Maltese, EMT-P  Chest Size (in):  36 Cup Size: A  Height: 5\' 5"  (1.651 m)  Weight:  126 lb (57.153 kg)  BMI:  Body mass index is 20.97 kg/(m^2). Tech Comments:  No Rx this am    Nuclear Med Study 1 or 2 day study: 1 day  Stress Test Type:  Stress  Reading MD: n/a  Order Authorizing Provider: P.Nahser MD  Resting Radionuclide: Technetium 32m Sestamibi  Resting Radionuclide Dose: 11.0 mCi   Stress Radionuclide:  Technetium 23m Sestamibi  Stress Radionuclide Dose: 33.0 mCi           Stress Protocol Rest HR: 58 Stress HR: 146  Rest BP: 152/70 Stress BP: 217/49  Exercise Time (min): 6:31 METS: 7.70   Predicted Max HR: 152 bpm % Max HR: 96.05 bpm Rate Pressure Product: 31682   Dose of Adenosine (mg):  n/a Dose of Lexiscan: n/a mg  Dose of Atropine (mg): n/a Dose of Dobutamine: n/a mcg/kg/min (at max HR)  Stress Test Technologist: Ileene Hutchinson, EMT-P  Nuclear Technologist:  Earl Many, CNMT     Rest Procedure:  Myocardial perfusion imaging was performed at rest 45 minutes following the intravenous administration of Technetium 63m Sestamibi. Rest ECG: NSR - Normal EKG  Stress Procedure:  The patient exercised on the treadmill utilizing the Bruce Protocol for 6:31  minutes. The patient stopped due to sob and denied any chest pain.  Technetium 55m Sestamibi was injected at peak exercise and myocardial perfusion imaging was performed after a brief delay. Stress ECG: Insignificant upsloping ST segment depression.  QPS Raw Data Images:  Normal; no motion artifact; normal heart/lung ratio. Stress Images:  Normal homogeneous uptake in all areas of the myocardium. Rest Images:  Normal homogeneous uptake in all areas of the myocardium. Subtraction (SDS):  There is no evidence of scar or ischemia. Transient Ischemic Dilatation (Normal <1.22):  0.92 Lung/Heart Ratio (Normal <0.45):  0.35  Quantitative Gated Spect Images QGS EDV:  58 ml QGS ESV:  16 ml  Impression Exercise Capacity:  Fair exercise capacity. BP Response:  Hypertensive BP response. Clinical Symptoms:  Dyspnea ECG Impression:  Insignificant upsloping ST segment depression. Comparison with Prior Nuclear Study: No images to compare  Overall Impression:  Normal stress nuclear study.  LV Ejection Fraction: 73%.  LV Wall Motion:  NL LV Function; NL Wall Motion   [name] 01/16/2015

## 2015-01-17 ENCOUNTER — Telehealth: Payer: Self-pay

## 2015-01-17 NOTE — Telephone Encounter (Signed)
spoke with pt about her stress myoview results. pt verbalized understanding that her result was normal and that she will copntinue on her current treatment plan.

## 2015-02-04 ENCOUNTER — Telehealth: Payer: Self-pay | Admitting: Cardiovascular Disease

## 2015-02-04 NOTE — Telephone Encounter (Signed)
New message      Pt c/o medication issue:  1. Name of Medication: atorvastatin  2. How are you currently taking this medication (dosage and times per day)?  3. Are you having a reaction (difficulty breathing--STAT)? no  4. What is your medication issue? Stopped rx for 1 month.  Muscle pain is not going but much better---should pt start crestor now?

## 2015-02-05 NOTE — Telephone Encounter (Signed)
Left message for patient to call the office

## 2015-02-05 NOTE — Telephone Encounter (Signed)
She is still having muscle aches after holding her statin for a month. She should consult her primary medical doctor It may not be related to the statin

## 2015-02-06 NOTE — Telephone Encounter (Signed)
Spoke to patient and reviewed Dr. Elmarie Shiley advice to follow-up with PCP regarding muscle pain as it may be unrelated to the statins.  Patient states she continues to have joint and muscle pain but states it is improved since holding Atorvastatin.  I advised patient to notify our office when she has been evaluated and we will discuss management of hyperlipidemia.  Patient verbalized understanding and agreement.

## 2015-02-13 ENCOUNTER — Encounter: Payer: Self-pay | Admitting: Family Medicine

## 2015-02-19 ENCOUNTER — Other Ambulatory Visit: Payer: Self-pay | Admitting: Cardiovascular Disease

## 2015-03-20 ENCOUNTER — Other Ambulatory Visit: Payer: Self-pay | Admitting: Family Medicine

## 2015-04-02 ENCOUNTER — Encounter: Payer: Self-pay | Admitting: Family Medicine

## 2015-04-02 ENCOUNTER — Other Ambulatory Visit: Payer: Self-pay

## 2015-04-02 ENCOUNTER — Ambulatory Visit (INDEPENDENT_AMBULATORY_CARE_PROVIDER_SITE_OTHER): Payer: Medicare Other | Admitting: Family Medicine

## 2015-04-02 VITALS — BP 140/70 | HR 78 | Temp 97.4°F | Resp 16 | Ht 65.0 in | Wt 132.0 lb

## 2015-04-02 DIAGNOSIS — Z1231 Encounter for screening mammogram for malignant neoplasm of breast: Secondary | ICD-10-CM

## 2015-04-02 DIAGNOSIS — N390 Urinary tract infection, site not specified: Secondary | ICD-10-CM

## 2015-04-02 DIAGNOSIS — I2510 Atherosclerotic heart disease of native coronary artery without angina pectoris: Secondary | ICD-10-CM | POA: Diagnosis not present

## 2015-04-02 DIAGNOSIS — R300 Dysuria: Secondary | ICD-10-CM | POA: Diagnosis not present

## 2015-04-02 LAB — URINALYSIS, MICROSCOPIC ONLY
Casts: NONE SEEN
Crystals: NONE SEEN

## 2015-04-02 LAB — URINALYSIS, ROUTINE W REFLEX MICROSCOPIC
Bilirubin Urine: NEGATIVE
Glucose, UA: NEGATIVE mg/dL
Ketones, ur: NEGATIVE mg/dL
NITRITE: NEGATIVE
Protein, ur: NEGATIVE mg/dL
SPECIFIC GRAVITY, URINE: 1.01 (ref 1.005–1.030)
UROBILINOGEN UA: 0.2 mg/dL (ref 0.0–1.0)
pH: 7 (ref 5.0–8.0)

## 2015-04-02 MED ORDER — CIPROFLOXACIN HCL 500 MG PO TABS: 500.0000 mg | ORAL_TABLET | Freq: Two times a day (BID) | ORAL | Status: DC

## 2015-04-02 NOTE — Patient Instructions (Signed)
Take the antibiotics as prescribed Cranberry juice for bladder F/U as needed

## 2015-04-03 ENCOUNTER — Encounter: Payer: Self-pay | Admitting: Family Medicine

## 2015-04-03 NOTE — Progress Notes (Signed)
Patient ID: ALEGRIA DOMINIQUE, female   DOB: April 17, 1946, 69 y.o.   MRN: [id number]   Subjective:    Patient ID: DIMA MINI, female    DOB: 1946/06/07, 69 y.o.   MRN: [id number]  Patient presents for Dy[facility]ria  patient here with dysuria foul odor to her urine cloudy urine over the past month. She stated has been progressively getting worse. She's also had some urinary frequency. She denies any fever she has a mild fullness in her bladder especially before she goes otherwise no pain. No change in her bowel movements and nausea vomiting associated. She does not typically get urinary tract infections.    Review Of Systems:  GEN- denies fatigue, fever, weight loss,weakness, recent illness HEENT- denies eye drainage, change in vision, nasal discharge, CVS- denies chest pain, palpitations RESP- denies SOB, cough, wheeze ABD- denies N/V, change in stools, abd pain GU- +dysuria, hematuria, dribbling, incontinence MSK- denies joint pain, muscle aches, injury Neuro- denies headache, dizziness, syncope, seizure activity       Objective:    BP 140/70 mmHg  Pulse 78  Temp(Src) 97.4 F (36.3 C) (Oral)  Resp 16  Ht 5\' 5"  (1.651 m)  Wt 132 lb (59.875 kg)  BMI 21.97 kg/m2 GEN- NAD, alert and oriented x3 HEENT- PERRL, EOMI, non injected sclera, pink conjunctiva, MMM, oropharynx clear CVS- RRR, no murmur RESP-CTAB ABD-NABS,soft,mild suprapubic tenderness, no CVA tenderness Pulses- Radial 2+        Assessment & Plan:      Problem List Items Addressed This Visit    None    Visit Diagnoses    UTI (lower urinary tract infection)    -  Primary    Treat with Cipro, cranberry, water    Relevant Orders    Urinalysis, Routine w reflex microscopic (not at [facility]) (Completed)       Note: This dictation was prepared with Dragon dictation along with smaller phrase technology. Any transcriptional errors that result from this process are unintentional.

## 2015-04-15 ENCOUNTER — Other Ambulatory Visit: Payer: Self-pay

## 2015-04-30 ENCOUNTER — Telehealth: Payer: Self-pay | Admitting: Family Medicine

## 2015-04-30 NOTE — Telephone Encounter (Signed)
Patient is having mammogram at the breast center on [address] on July 25th at 12:00, would like to know if she can also get a bone density done on the same day around the same time if possible [phone] if any questions

## 2015-05-07 ENCOUNTER — Other Ambulatory Visit: Payer: Self-pay | Admitting: Family Medicine

## 2015-05-07 DIAGNOSIS — E2839 Other primary ovarian failure: Secondary | ICD-10-CM

## 2015-05-07 NOTE — Telephone Encounter (Signed)
Contacted pt and informed her of different time for bone density and mammogram

## 2015-05-13 ENCOUNTER — Ambulatory Visit: Payer: Medicare Other

## 2015-05-13 ENCOUNTER — Other Ambulatory Visit: Payer: Medicare Other

## 2015-05-22 ENCOUNTER — Ambulatory Visit
Admission: RE | Admit: 2015-05-22 | Discharge: 2015-05-22 | Disposition: A | Discharge status: Home / Self Care | Payer: Medicare Other | Source: Ambulatory Visit | Attending: Family Medicine | Admitting: Family Medicine

## 2015-05-22 ENCOUNTER — Other Ambulatory Visit: Payer: Self-pay | Admitting: Physician Assistant

## 2015-05-22 ENCOUNTER — Ambulatory Visit
Admission: RE | Admit: 2015-05-22 | Discharge: 2015-05-22 | Disposition: A | Discharge status: Home / Self Care | Payer: Medicare Other | Source: Ambulatory Visit

## 2015-05-22 DIAGNOSIS — Z1231 Encounter for screening mammogram for malignant neoplasm of breast: Secondary | ICD-10-CM | POA: Diagnosis not present

## 2015-05-22 DIAGNOSIS — E2839 Other primary ovarian failure: Secondary | ICD-10-CM

## 2015-05-22 DIAGNOSIS — M810 Age-related osteoporosis without current pathological fracture: Secondary | ICD-10-CM | POA: Diagnosis not present

## 2015-05-23 ENCOUNTER — Other Ambulatory Visit: Payer: Self-pay | Admitting: *Deleted

## 2015-05-23 MED ORDER — ALENDRONATE SODIUM 70 MG PO TABS
70.0000 mg | ORAL_TABLET | ORAL | Status: DC
Start: 2015-05-23 — End: 2016-04-30

## 2015-06-19 ENCOUNTER — Encounter: Payer: Self-pay | Admitting: Family Medicine

## 2015-06-25 DIAGNOSIS — H04123 Dry eye syndrome of bilateral lacrimal glands: Secondary | ICD-10-CM | POA: Diagnosis not present

## 2015-06-25 DIAGNOSIS — H2513 Age-related nuclear cataract, bilateral: Secondary | ICD-10-CM | POA: Diagnosis not present

## 2015-07-02 ENCOUNTER — Ambulatory Visit (INDEPENDENT_AMBULATORY_CARE_PROVIDER_SITE_OTHER): Payer: Medicare Other | Admitting: Cardiovascular Disease

## 2015-07-02 ENCOUNTER — Encounter: Payer: Self-pay | Admitting: Cardiovascular Disease

## 2015-07-02 VITALS — BP 110/50 | HR 60 | Ht 65.0 in | Wt 131.0 lb

## 2015-07-02 DIAGNOSIS — I2510 Atherosclerotic heart disease of native coronary artery without angina pectoris: Secondary | ICD-10-CM

## 2015-07-02 DIAGNOSIS — E785 Hyperlipidemia, unspecified: Secondary | ICD-10-CM

## 2015-07-02 LAB — BASIC METABOLIC PANEL
BUN: 14 mg/dL (ref 6–23)
CO2: 28 mEq/L (ref 19–32)
CREATININE: 0.84 mg/dL (ref 0.40–1.20)
Calcium: 9.1 mg/dL (ref 8.4–10.5)
Chloride: 102 mEq/L (ref 96–112)
GFR: 71.36 mL/min (ref >60.00)
Glucose, Bld: 78 mg/dL (ref 70–99)
Potassium: 3.9 mEq/L (ref 3.5–5.1)
Sodium: 138 mEq/L (ref 135–145)

## 2015-07-02 LAB — HEPATIC FUNCTION PANEL
ALK PHOS: 57 U/L (ref 39–117)
ALT: 26 U/L (ref 0–35)
AST: 26 U/L (ref 0–37)
Albumin: 4.4 g/dL (ref 3.5–5.2)
BILIRUBIN TOTAL: 0.5 mg/dL (ref 0.2–1.2)
Bilirubin, Direct: 0.1 mg/dL (ref 0.0–0.3)
TOTAL PROTEIN: 7.2 g/dL (ref 6.0–8.3)

## 2015-07-02 LAB — LIPID PANEL
CHOL/HDL RATIO: 3
Cholesterol: 116 mg/dL (ref 0–200)
HDL: 43.7 mg/dL (ref >39.00)
LDL Cholesterol: 52 mg/dL (ref 0–99)
NONHDL: 72.76
Triglycerides: 105 mg/dL (ref 0.0–149.0)
VLDL: 21 mg/dL (ref 0.0–40.0)

## 2015-07-02 NOTE — Progress Notes (Signed)
Cardiology Office Note   Date:  07/02/2015   ID:  [name], [name] [birth year], MRN [id number]  PCP:  Odette Fraction, MD  Cardiologist:   Thayer Headings, MD   Chief Complaint  Patient presents with  . Follow-up    CAD, hyperlipidemia   1. Coronary artery disease-status post coronary artery bypass graft- 2008 Status post PCI in 2010. 2. craniotomy for subdural hematoma. 20.  hyperlipidemia-    History of Present Illness:  Nancy [name] is doing well from a cardiac standpoint.  She is having lots of leg cramps. Takes Tylenol PM each night for cramps. She's tried taking mustard and this does not help. Her atorvastatin dose has been cut in 1/2 but she continues to have joint pain, muscle ache. She also has numbness in her feet,  Has lots of ankle, knee and hip pain    January 03, 2015:   Nancy [name] is a 69 y.o. female who presents for follow-up of her coronary artery disease.  Sept. 13, 2016:  Doing well.  Normal stress myoview in March , 2016, Lipids looked great  Feeling well.  Not sleeping well.   Past Medical History  Diagnosis Date  . DYSLIPIDEMIA   . Anxiety state, unspecified   . Essential hypertension, benign   . CORONARY ATHEROSCLEROSIS NATIVE CORONARY ARTERY   . CHEST PAIN UNSPECIFIED   . COLONIC POLYPS, HX OF     Past Surgical History  Procedure Laterality Date  . Coronary artery bypass graft  2009  . Nasal sinus surgery    . Polypectomy    . Craniotomy  04/27/2012    Procedure: CRANIOTOMY HEMATOMA EVACUATION SUBDURAL;  Surgeon: Eustace Moore, MD;  Location: [facility];  Service: Neurosurgery;  Laterality: Right;  Right Frontal Burr hole for Subdural Hematoma  . Coronary angioplasty with stent placement  2010    3 times     Current Outpatient Prescriptions  Medication Sig Dispense Refill  . alendronate (FOSAMAX) 70 MG tablet Take 1 tablet (70 mg total) by mouth every 7 (seven) days. Take with a full glass of water on an  empty stomach. 12 tablet 3  . Alpha-D-Galactosidase (BEANO) TABS Take 2 tablets by mouth 3 (three) times daily.    Marland Kitchen aspirin 81 MG tablet Take 81 mg by mouth daily.     . Calcium Carbonate (CALTRATE 600 PO) Take 1 tablet by mouth once a week.    . Cholecalciferol (VITAMIN D-3 PO) Take 1 tablet by mouth every other day.     . Coenzyme Q10 (CO Q 10 PO) Take 1 capsule by mouth every other day.    . diphenhydrAMINE (BENADRYL) 25 MG tablet Take 25 mg by mouth at bedtime as needed. For sleep    . diphenhydramine-acetaminophen (TYLENOL PM) 25-500 MG TABS Take 1 tablet by mouth at bedtime as needed. For insomnia/headache    . Fiber CHEW Chew 1 each by mouth daily.     Marland Kitchen FLAXSEED, LINSEED, PO Take 1 capsule by mouth daily.     Marland Kitchen losartan (COZAAR) 25 MG tablet TAKE ONE TABLET BY MOUTH ONCE DAILY 30 tablet 4  . metoprolol succinate (TOPROL-XL) 25 MG 24 hr tablet TAKE THREE TABLETS BY MOUTH ONCE DAILY 90 tablet 3  . nitroGLYCERIN (NITROSTAT) 0.4 MG SL tablet Place 1 tablet (0.4 mg total) under the tongue every 5 (five) minutes as needed. For chest pain 25 tablet 3  . Omega-3 Fatty Acids (EQL OMEGA 3 FISH OIL) 1400 MG  CAPS Take 1 tablet by mouth daily.    . polyethylene glycol (MIRALAX / GLYCOLAX) packet Take 17 g by mouth daily.     . rosuvastatin (CRESTOR) 10 MG tablet Take 1 tablet (10 mg total) by mouth daily. 31 tablet 11  . vitamin E (VITAMIN E) 400 UNIT capsule Take 400 Units by mouth daily.    Marland Kitchen ZETIA 10 MG tablet TAKE ONE TABLET BY MOUTH ONCE DAILY 30 tablet 4   No current facility-administered medications for this visit.    Allergies:   Atorvastatin; Bactrim; Morphine and related; Prednisone; and Sulfonamide derivatives    Social History:  The patient  reports that she has never smoked. She has never used smokeless tobacco. She reports that she drinks alcohol. She reports that she does not use illicit drugs.   Family History:  The patient's family history includes Heart attack in her father;  Hypertension in her brother and mother; Stroke in her maternal grandmother.    ROS:  Please see the history of present illness.    Review of Systems: Constitutional:  denies fever, chills, diaphoresis, appetite change and fatigue.  HEENT: denies photophobia, eye pain, redness, hearing loss, ear pain, congestion, sore throat, rhinorrhea, sneezing, neck pain, neck stiffness and tinnitus.  Respiratory: denies SOB, DOE, cough, chest tightness, and wheezing.  Cardiovascular: denies chest pain, palpitations and leg swelling.  Gastrointestinal: denies nausea, vomiting, abdominal pain, diarrhea, constipation, blood in stool.  Genitourinary: denies dysuria, urgency, frequency, hematuria, flank pain and difficulty urinating.  Musculoskeletal: denies  myalgias, back pain, joint swelling, arthralgias and gait problem.   Skin: denies pallor, rash and wound.  Neurological: denies dizziness, seizures, syncope, weakness, light-headedness, numbness and headaches.   Hematological: denies adenopathy, easy bruising, personal or family bleeding history.  Psychiatric/ Behavioral: denies suicidal ideation, mood changes, confusion, nervousness, sleep disturbance and agitation.       All other systems are reviewed and negative.    PHYSICAL EXAM: VS:  BP 110/50 mmHg  Pulse 60  Ht 5\' 5"  (1.651 m)  Wt 59.421 kg (131 lb)  BMI 21.80 kg/m2 , BMI Body mass index is 21.8 kg/(m^2). GEN: Well nourished, well developed, in no acute distress HEENT: normal Neck: no JVD, carotid bruits, or masses Cardiac: RRR; no murmurs, rubs, or gallops,no edema  Respiratory:  clear to auscultation bilaterally, normal work of breathing GI: soft, nontender, nondistended, + BS MS: no deformity or atrophy Skin: warm and dry, no rash Neuro:  Strength and sensation are intact Psych: normal   EKG:  EKG is ordered today. Normal sinus rhythm at 60. Normal EKG.   Recent Labs: 10/29/2014: Hemoglobin 14.5; Platelets 258 12/11/2014:  TSH 2.578 01/03/2015: ALT 31; BUN 15; Creatinine, Ser 0.93; Potassium 5.0; Sodium 143    Lipid Panel    Component Value Date/Time   CHOL 109 01/03/2015 0847   TRIG 99.0 01/03/2015 0847   HDL 43.80 01/03/2015 0847   CHOLHDL 2 01/03/2015 0847   VLDL 19.8 01/03/2015 0847   LDLCALC 45 01/03/2015 0847      Wt Readings from Last 3 Encounters:  07/02/15 59.421 kg (131 lb)  04/02/15 59.875 kg (132 lb)  01/16/15 57.153 kg (126 lb)      Other studies Reviewed: Additional studies/ records that were reviewed today include: . Review of the above records demonstrates:    ASSESSMENT AND PLAN:  1. Coronary artery disease-status post coronary artery bypass graft- 2008 Status post PCI in 2010. , She is doing well. She's not having any episodes  of angina.  2. craniotomy for subdural hematoma.  3.   hyperlipidemia- she's been on low-dose atorvastatin but she has severe myalgias and muscle cramps.  Is tolerating the crestor without any difficulty .   Current medicines are reviewed at length with the patient today.  The patient does not have concerns regarding medicines.  The following changes have been made:  no change  Labs/ tests ordered today include:  No orders of the defined types were placed in this encounter.     Disposition:   FU with me in 6 months with fasting labs.    Signed, Nancy [name], Wonda Cheng, MD  07/02/2015 2:10 PM    [facility] [place], [place], [place]  [address] Phone: [phone]; Fax: [phone]

## 2015-07-02 NOTE — Patient Instructions (Signed)

## 2015-07-15 ENCOUNTER — Encounter: Payer: Self-pay | Admitting: Family Medicine

## 2015-07-22 ENCOUNTER — Other Ambulatory Visit: Payer: Self-pay | Admitting: Cardiovascular Disease

## 2015-07-22 ENCOUNTER — Other Ambulatory Visit: Payer: Self-pay | Admitting: Family Medicine

## 2015-08-06 ENCOUNTER — Ambulatory Visit (INDEPENDENT_AMBULATORY_CARE_PROVIDER_SITE_OTHER): Payer: Medicare Other | Admitting: Family Medicine

## 2015-08-06 DIAGNOSIS — Z23 Encounter for immunization: Secondary | ICD-10-CM

## 2015-11-01 ENCOUNTER — Ambulatory Visit (INDEPENDENT_AMBULATORY_CARE_PROVIDER_SITE_OTHER): Payer: Medicare Other | Admitting: Family Medicine

## 2015-11-01 ENCOUNTER — Encounter: Payer: Self-pay | Admitting: Family Medicine

## 2015-11-01 VITALS — BP 108/56 | HR 76 | Temp 97.8°F | Resp 14 | Ht 65.0 in | Wt 132.0 lb

## 2015-11-01 DIAGNOSIS — E785 Hyperlipidemia, unspecified: Secondary | ICD-10-CM | POA: Diagnosis not present

## 2015-11-01 LAB — COMPLETE METABOLIC PANEL WITH GFR
ALT: 20 U/L (ref 6–29)
AST: 25 U/L (ref 10–35)
Albumin: 4.2 g/dL (ref 3.6–5.1)
Alkaline Phosphatase: 44 U/L (ref 33–130)
BUN: 13 mg/dL (ref 7–25)
CO2: 27 mmol/L (ref 20–31)
CREATININE: 0.86 mg/dL (ref 0.50–0.99)
Calcium: 8.9 mg/dL (ref 8.6–10.4)
Chloride: 100 mmol/L (ref 98–110)
GFR, EST AFRICAN AMERICAN: 80 mL/min (ref >=60)
GFR, Est Non African American: 69 mL/min (ref >=60)
Glucose, Bld: 64 mg/dL — ABNORMAL LOW (ref 70–99)
POTASSIUM: 4 mmol/L (ref 3.5–5.3)
Sodium: 136 mmol/L (ref 135–146)
Total Bilirubin: 0.7 mg/dL (ref 0.2–1.2)
Total Protein: 6.5 g/dL (ref 6.1–8.1)

## 2015-11-01 LAB — CBC WITH DIFFERENTIAL/PLATELET
Basophils Absolute: 0.1 10*3/uL (ref 0.0–0.1)
Basophils Relative: 1 % (ref 0–1)
Eosinophils Absolute: 0.1 10*3/uL (ref 0.0–0.7)
Eosinophils Relative: 2 % (ref 0–5)
HCT: 40.6 % (ref 36.0–46.0)
Hemoglobin: 13.8 g/dL (ref 12.0–15.0)
Lymphocytes Relative: 22 % (ref 12–46)
Lymphs Abs: 1.5 10*3/uL (ref 0.7–4.0)
MCH: 31.4 pg (ref 26.0–34.0)
MCHC: 34 g/dL (ref 30.0–36.0)
MCV: 92.3 fL (ref 78.0–100.0)
MPV: 10.8 fL (ref 8.6–12.4)
Monocytes Absolute: 0.5 10*3/uL (ref 0.1–1.0)
Monocytes Relative: 8 % (ref 3–12)
NEUTROS ABS: 4.5 10*3/uL (ref 1.7–7.7)
Neutrophils Relative %: 67 % (ref 43–77)
PLATELETS: 221 10*3/uL (ref 150–400)
RBC: 4.4 MIL/uL (ref 3.87–5.11)
RDW: 13.6 % (ref 11.5–15.5)
WBC: 6.7 10*3/uL (ref 4.0–10.5)

## 2015-11-01 LAB — LIPID PANEL
Cholesterol: 123 mg/dL — ABNORMAL LOW (ref 125–200)
HDL: 51 mg/dL (ref >=46)
LDL CALC: 52 mg/dL (ref <130)
Total CHOL/HDL Ratio: 2.4 Ratio (ref <=5.0)
Triglycerides: 98 mg/dL (ref <150)
VLDL: 20 mg/dL (ref <30)

## 2015-11-01 NOTE — Progress Notes (Signed)
Subjective:    Patient ID: Nancy [name], female    DOB: [birth year], 70 y.o.   MRN: [id number]  HPI   patient is here today for follow-up of her medical problems. She has a history of coronary artery disease status post coronary artery bypass grafting. She also has a history of hyperlipidemia. Recheck a history of statin intolerance due to myalgias. Again today she is complaining of muscle pain in her calves and in her hamstrings bilaterally. The pain is more of a cramping that occurs during the day and at night. It has no relationship to activity. She denies any claudication. She denies any neuropathy. Otherwise she is doing well. She denies any chest pain or shortness of breath or dyspnea on exertion. Past Medical History  Diagnosis Date  . DYSLIPIDEMIA   . Anxiety state, unspecified   . Essential hypertension, benign   . CORONARY ATHEROSCLEROSIS NATIVE CORONARY ARTERY   . CHEST PAIN UNSPECIFIED   . COLONIC POLYPS, HX OF    Past Surgical History  Procedure Laterality Date  . Coronary artery bypass graft  2009  . Nasal sinus surgery    . Polypectomy    . Craniotomy  04/27/2012    Procedure: CRANIOTOMY HEMATOMA EVACUATION SUBDURAL;  Surgeon: Eustace Moore, MD;  Location: [facility];  Service: Neurosurgery;  Laterality: Right;  Right Frontal Burr hole for Subdural Hematoma  . Coronary angioplasty with stent placement  2010    3 times   Current Outpatient Prescriptions on File Prior to Visit  Medication Sig Dispense Refill  . alendronate (FOSAMAX) 70 MG tablet Take 1 tablet (70 mg total) by mouth every 7 (seven) days. Take with a full glass of water on an empty stomach. 12 tablet 3  . Alpha-D-Galactosidase (BEANO) TABS Take 2 tablets by mouth 3 (three) times daily.    Marland Kitchen aspirin 81 MG tablet Take 81 mg by mouth daily.     . Calcium Carbonate (CALTRATE 600 PO) Take 1 tablet by mouth once a week.    . Cholecalciferol (VITAMIN D-3 PO) Take 1 tablet by mouth every other day.     .  Coenzyme Q10 (CO Q 10 PO) Take 1 capsule by mouth every other day.    . diphenhydrAMINE (BENADRYL) 25 MG tablet Take 25 mg by mouth at bedtime as needed. For sleep    . diphenhydramine-acetaminophen (TYLENOL PM) 25-500 MG TABS Take 1 tablet by mouth at bedtime as needed. For insomnia/headache    . Fiber CHEW Chew 1 each by mouth daily.     Marland Kitchen FLAXSEED, LINSEED, PO Take 1 capsule by mouth daily.     Marland Kitchen losartan (COZAAR) 25 MG tablet TAKE ONE TABLET BY MOUTH ONCE DAILY 30 tablet 6  . metoprolol succinate (TOPROL-XL) 25 MG 24 hr tablet TAKE THREE TABLETS BY MOUTH ONCE DAILY 90 tablet 3  . nitroGLYCERIN (NITROSTAT) 0.4 MG SL tablet Place 1 tablet (0.4 mg total) under the tongue every 5 (five) minutes as needed. For chest pain 25 tablet 3  . Omega-3 Fatty Acids (EQL OMEGA 3 FISH OIL) 1400 MG CAPS Take 1 tablet by mouth daily.    . polyethylene glycol (MIRALAX / GLYCOLAX) packet Take 17 g by mouth daily.     . rosuvastatin (CRESTOR) 10 MG tablet Take 1 tablet (10 mg total) by mouth daily. 31 tablet 11  . vitamin E (VITAMIN E) 400 UNIT capsule Take 400 Units by mouth daily.    Marland Kitchen ZETIA 10 MG tablet TAKE  ONE TABLET BY MOUTH ONCE DAILY 30 tablet 6   No current facility-administered medications on file prior to visit.   Allergies  Allergen Reactions  . Atorvastatin Other (See Comments)    Muscle cramps  . Bactrim [Sulfamethoxazole-Trimethoprim] Other (See Comments)    Blisters/skin peeling, nausea  . Morphine And Related Nausea Only  . Prednisone Other (See Comments)    Unknown  . Sulfonamide Derivatives Other (See Comments)    Blisters/skin peeling   Social History   Social History  . Marital Status: Married    Spouse Name: N/A  . Number of Children: N/A  . Years of Education: N/A   Occupational History  . Not on file.   Social History Main Topics  . Smoking status: Never Smoker   . Smokeless tobacco: Never Used  . Alcohol Use: Yes     Comment: occasional glass wine  . Drug Use: No    . Sexual Activity: Not on file   Other Topics Concern  . Not on file   Social History Narrative      Review of Systems  All other systems reviewed and are negative.      Objective:   Physical Exam  Constitutional: She is oriented to person, place, and time.  Cardiovascular: Normal rate, regular rhythm and normal heart sounds.   No murmur heard. Pulmonary/Chest: Effort normal and breath sounds normal. No respiratory distress. She has no wheezes. She has no rales.  Abdominal: Soft. Bowel sounds are normal. She exhibits no distension and no mass. There is no tenderness. There is no rebound and no guarding.  Musculoskeletal: Normal range of motion. She exhibits no edema or tenderness.  Neurological: She is alert and oriented to person, place, and time. She has normal reflexes. No cranial nerve deficit. She exhibits normal muscle tone. Coordination normal.  Vitals reviewed.         Assessment & Plan:  HLD (hyperlipidemia) - Plan: CBC with Differential/Platelet, COMPLETE METABOLIC PANEL WITH GFR, Lipid panel   check a CMP and fasting lipid panel. Goal LDL cholesterol is less than 70. I recommended the patient try tonic water with quinine for cramps ear and also recommended magnesium oxide 400 mg pills daily to try to help prevent cramps. Blood pressure is excellent. Immunizations are up-to-date. The remainder of her preventative care is up-to-date.

## 2015-11-04 ENCOUNTER — Encounter: Payer: Self-pay | Admitting: Family Medicine

## 2015-11-19 ENCOUNTER — Other Ambulatory Visit: Payer: Self-pay | Admitting: Family Medicine

## 2015-11-20 NOTE — Telephone Encounter (Signed)
Medication refilled per protocol. 

## 2016-01-01 DIAGNOSIS — M180 Bilateral primary osteoarthritis of first carpometacarpal joints: Secondary | ICD-10-CM | POA: Diagnosis not present

## 2016-01-06 DIAGNOSIS — M180 Bilateral primary osteoarthritis of first carpometacarpal joints: Secondary | ICD-10-CM | POA: Diagnosis not present

## 2016-01-08 ENCOUNTER — Encounter: Payer: Self-pay | Admitting: Cardiovascular Disease

## 2016-01-08 ENCOUNTER — Ambulatory Visit (INDEPENDENT_AMBULATORY_CARE_PROVIDER_SITE_OTHER): Payer: Medicare Other | Admitting: Cardiovascular Disease

## 2016-01-08 VITALS — BP 106/68 | HR 56 | Ht 65.0 in | Wt 131.0 lb

## 2016-01-08 DIAGNOSIS — I2510 Atherosclerotic heart disease of native coronary artery without angina pectoris: Secondary | ICD-10-CM

## 2016-01-08 DIAGNOSIS — E785 Hyperlipidemia, unspecified: Secondary | ICD-10-CM | POA: Diagnosis not present

## 2016-01-08 NOTE — Patient Instructions (Signed)

## 2016-01-08 NOTE — Progress Notes (Signed)
Cardiology Office Note   Date:  01/08/2016   ID:  Nancy, [name] 11-24-1945, MRN [id number]  PCP:  Nancy Fraction, MD  Cardiologist:   Thayer Headings, MD   Chief Complaint  Patient presents with  . Follow-up    CAD   1. Coronary artery disease-status post coronary artery bypass graft- 2008 Status post PCI in 2010. 2. craniotomy for subdural hematoma. 20.  hyperlipidemia-    History of Present Illness:  Nancy [name] is doing well from a cardiac standpoint.  She is having lots of leg cramps. Takes Tylenol PM each night for cramps. She's tried taking mustard and this does not help. Her atorvastatin dose has been cut in 1/2 but she continues to have joint pain, muscle ache. She also has numbness in her feet,  Has lots of ankle, knee and hip pain    January 03, 2015:   Nancy [name] is a 70 y.o. female who presents for follow-up of her coronary artery disease.  Sept. 13, 2016:  Doing well.  Normal stress myoview in March , 2016, Lipids looked great  Feeling well.  Not sleeping well.   January 07, 2106:  Has had some palpitations when she lies on her left side to sleep  Has worn the monitor in the past Does not want to necessarily wear it again  Has bilateral hand splints on - arthritis   No CP or dyspnea. Has slowed down with her walking  - walking slower and perhaps not as far.    Past Medical History  Diagnosis Date  . DYSLIPIDEMIA   . Anxiety state, unspecified   . Essential hypertension, benign   . CORONARY ATHEROSCLEROSIS NATIVE CORONARY ARTERY   . CHEST PAIN UNSPECIFIED   . COLONIC POLYPS, HX OF     Past Surgical History  Procedure Laterality Date  . Coronary artery bypass graft  2009  . Nasal sinus surgery    . Polypectomy    . Craniotomy  04/27/2012    Procedure: CRANIOTOMY HEMATOMA EVACUATION SUBDURAL;  Surgeon: Eustace Moore, MD;  Location: [facility];  Service: Neurosurgery;  Laterality: Right;  Right Frontal Burr hole  for Subdural Hematoma  . Coronary angioplasty with stent placement  2010    3 times     Current Outpatient Prescriptions  Medication Sig Dispense Refill  . alendronate (FOSAMAX) 70 MG tablet Take 1 tablet (70 mg total) by mouth every 7 (seven) days. Take with a full glass of water on an empty stomach. 12 tablet 3  . Alpha-D-Galactosidase (BEANO) TABS Take 2 tablets by mouth 3 (three) times daily.    Marland Kitchen aspirin 81 MG tablet Take 81 mg by mouth daily.     . Calcium Carbonate (CALTRATE 600 PO) Take 1 tablet by mouth once a week.    . Cholecalciferol (VITAMIN D-3 PO) Take 1 tablet by mouth every other day.     . Coenzyme Q10 (CO Q 10 PO) Take 1 capsule by mouth every other day.    . diphenhydrAMINE (BENADRYL) 25 MG tablet Take 25 mg by mouth at bedtime as needed. For sleep    . diphenhydramine-acetaminophen (TYLENOL PM) 25-500 MG TABS Take 1 tablet by mouth at bedtime as needed. For insomnia/headache    . Fiber CHEW Chew 1 each by mouth daily.     Marland Kitchen FLAXSEED, LINSEED, PO Take 1 capsule by mouth daily.     Marland Kitchen losartan (COZAAR) 25 MG tablet TAKE ONE TABLET BY MOUTH ONCE DAILY  30 tablet 6  . metoprolol succinate (TOPROL-XL) 25 MG 24 hr tablet TAKE THREE TABLETS BY MOUTH ONCE DAILY 90 tablet 1  . nitroGLYCERIN (NITROSTAT) 0.4 MG SL tablet Place 1 tablet (0.4 mg total) under the tongue every 5 (five) minutes as needed. For chest pain 25 tablet 3  . Omega-3 Fatty Acids (EQL OMEGA 3 FISH OIL) 1400 MG CAPS Take 1 tablet by mouth daily.    . polyethylene glycol (MIRALAX / GLYCOLAX) packet Take 17 g by mouth daily.     . rosuvastatin (CRESTOR) 10 MG tablet Take 1 tablet (10 mg total) by mouth daily. 31 tablet 11  . vitamin E (VITAMIN E) 400 UNIT capsule Take 400 Units by mouth daily.    Marland Kitchen ZETIA 10 MG tablet TAKE ONE TABLET BY MOUTH ONCE DAILY 30 tablet 6   No current facility-administered medications for this visit.    Allergies:   Atorvastatin; Bactrim; Buprenorphine hcl; Morphine and related;  Prednisone; Sulfa antibiotics; and Sulfonamide derivatives    Social History:  The patient  reports that she has never smoked. She has never used smokeless tobacco. She reports that she drinks alcohol. She reports that she does not use illicit drugs.   Family History:  The patient's family history includes Heart attack in her father; Hypertension in her brother and mother; Stroke in her maternal grandmother.    ROS:  Please see the history of present illness.    Review of Systems: Constitutional:  denies fever, chills, diaphoresis, appetite change and fatigue.  HEENT: denies photophobia, eye pain, redness, hearing loss, ear pain, congestion, sore throat, rhinorrhea, sneezing, neck pain, neck stiffness and tinnitus.  Respiratory: denies SOB, DOE, cough, chest tightness, and wheezing.  Cardiovascular: denies chest pain, palpitations and leg swelling.  Gastrointestinal: denies nausea, vomiting, abdominal pain, diarrhea, constipation, blood in stool.  Genitourinary: denies dysuria, urgency, frequency, hematuria, flank pain and difficulty urinating.  Musculoskeletal: denies  myalgias, back pain, joint swelling, arthralgias and gait problem.   Skin: denies pallor, rash and wound.  Neurological: denies dizziness, seizures, syncope, weakness, light-headedness, numbness and headaches.   Hematological: denies adenopathy, easy bruising, personal or family bleeding history.  Psychiatric/ Behavioral: denies suicidal ideation, mood changes, confusion, nervousness, sleep disturbance and agitation.       All other systems are reviewed and negative.    PHYSICAL EXAM: VS:  BP 106/68 mmHg  Pulse 56  Ht 5\' 5"  (1.651 m)  Wt 131 lb (59.421 kg)  BMI 21.80 kg/m2 , BMI Body mass index is 21.8 kg/(m^2). GEN: Well nourished, well developed, in no acute distress HEENT: normal Neck: no JVD, carotid bruits, or masses Cardiac: RRR; no murmurs, rubs, or gallops,no edema  Respiratory:  clear to auscultation  bilaterally, normal work of breathing GI: soft, nontender, nondistended, + BS MS: no deformity or atrophy Skin: warm and dry, no rash Neuro:  Strength and sensation are intact Psych: normal   EKG:  EKG is ordered today. Normal sinus rhythm at 60. Normal EKG.   Recent Labs: 11/01/2015: ALT 20; BUN 13; Creat 0.86; Hemoglobin 13.8; Platelets 221; Potassium 4.0; Sodium 136    Lipid Panel    Component Value Date/Time   CHOL 123* 11/01/2015 1118   TRIG 98 11/01/2015 1118   HDL 51 11/01/2015 1118   CHOLHDL 2.4 11/01/2015 1118   VLDL 20 11/01/2015 1118   LDLCALC 52 11/01/2015 1118      Wt Readings from Last 3 Encounters:  01/08/16 131 lb (59.421 kg)  11/01/15 132 lb (  59.875 kg)  07/02/15 131 lb (59.421 kg)      Other studies Reviewed: Additional studies/ records that were reviewed today include: . Review of the above records demonstrates:    ASSESSMENT AND PLAN:  1. Coronary artery disease-status post coronary artery bypass graft- 2008 Status post PCI in 2010. , She is doing well. She's not having any episodes of angina.  2. craniotomy for subdural hematoma.  3.   hyperlipidemia- she's been on low-dose atorvastatin but she has severe myalgias and muscle cramps.  Is tolerating the crestor without any difficulty .   Current medicines are reviewed at length with the patient today.  The patient does not have concerns regarding medicines.  The following changes have been made:  no change  Labs/ tests ordered today include:  No orders of the defined types were placed in this encounter.     Disposition:   FU with me in 6 months with fasting labs.    Signed, Bronislaus Verdell, Wonda Cheng, MD  01/08/2016 10:13 AM    [facility] [place], [place], [place]  [address] Phone: [phone]; Fax: [phone]

## 2016-01-20 ENCOUNTER — Other Ambulatory Visit: Payer: Self-pay | Admitting: Family Medicine

## 2016-01-20 ENCOUNTER — Other Ambulatory Visit: Payer: Self-pay | Admitting: Cardiovascular Disease

## 2016-01-21 ENCOUNTER — Other Ambulatory Visit: Payer: Self-pay | Admitting: *Deleted

## 2016-01-21 MED ORDER — NITROGLYCERIN 0.4 MG SL SUBL: 0.4000 mg | SUBLINGUAL_TABLET | SUBLINGUAL | Status: DC | PRN

## 2016-01-21 NOTE — Telephone Encounter (Signed)
Refill appropriate and filled per protocol. 

## 2016-02-17 ENCOUNTER — Other Ambulatory Visit: Payer: Self-pay | Admitting: Cardiovascular Disease

## 2016-04-30 ENCOUNTER — Encounter: Payer: Self-pay | Admitting: Family Medicine

## 2016-04-30 ENCOUNTER — Ambulatory Visit (INDEPENDENT_AMBULATORY_CARE_PROVIDER_SITE_OTHER): Payer: Medicare Other | Admitting: Family Medicine

## 2016-04-30 VITALS — BP 110/56 | HR 82 | Temp 99.1°F | Resp 12 | Ht 65.0 in | Wt 127.0 lb

## 2016-04-30 DIAGNOSIS — I2510 Atherosclerotic heart disease of native coronary artery without angina pectoris: Secondary | ICD-10-CM

## 2016-04-30 DIAGNOSIS — M791 Myalgia: Secondary | ICD-10-CM

## 2016-04-30 DIAGNOSIS — IMO0001 Reserved for inherently not codable concepts without codable children: Secondary | ICD-10-CM

## 2016-04-30 DIAGNOSIS — M609 Myositis, unspecified: Secondary | ICD-10-CM | POA: Diagnosis not present

## 2016-04-30 NOTE — Progress Notes (Signed)
Subjective:    Patient ID: Nancy [name], female    DOB: 03/14/1946, 70 y.o.   MRN: [id number]  HPI Patient feels terrible. She reports joint pains all over her body. Primarily she is having pains in all the joints of her hand. She was diagnosed as osteoarthritis at the hand clinic and was started on topical NSAIDs. She also complains of severe pain in both shoulders and pain with abduction in both shoulders. She is also complaining of some pain in both knees. She is complaining of forgetfulness. She feels weak and tired and has no desire to exercise. Past Medical History  Diagnosis Date  . DYSLIPIDEMIA   . Anxiety state, unspecified   . Essential hypertension, benign   . CORONARY ATHEROSCLEROSIS NATIVE CORONARY ARTERY   . CHEST PAIN UNSPECIFIED   . COLONIC POLYPS, HX OF    Past Surgical History  Procedure Laterality Date  . Coronary artery bypass graft  2009  . Nasal sinus surgery    . Polypectomy    . 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 Coronary angioplasty with stent placement  2010    3 times   Current Outpatient Prescriptions on File Prior to Visit  Medication Sig Dispense Refill  . Alpha-D-Galactosidase (BEANO) TABS Take 2 tablets by mouth 3 (three) times daily.    Marland Kitchen aspirin 81 MG tablet Take 81 mg by mouth daily.     . Calcium Carbonate (CALTRATE 600 PO) Take 1 tablet by mouth once a week.    . Cholecalciferol (VITAMIN D-3 PO) Take 1 tablet by mouth every other day.     . Coenzyme Q10 (CO Q 10 PO) Take 1 capsule by mouth every other day.    . CRESTOR 10 MG tablet TAKE ONE TABLET BY MOUTH ONCE DAILY 90 tablet 3  . diphenhydrAMINE (BENADRYL) 25 MG tablet Take 25 mg by mouth at bedtime as needed. For sleep    . diphenhydramine-acetaminophen (TYLENOL PM) 25-500 MG TABS Take 1 tablet by  mouth at bedtime as needed. For insomnia/headache    . ezetimibe (ZETIA) 10 MG tablet TAKE ONE TABLET BY MOUTH ONCE DAILY 30 tablet 11  . Fiber CHEW Chew 1 each by mouth daily.     Marland Kitchen FLAXSEED, LINSEED, PO Take 1 capsule by mouth daily.     Marland Kitchen losartan (COZAAR) 25 MG tablet TAKE ONE TABLET BY MOUTH ONCE DAILY 30 tablet 11  . metoprolol succinate (TOPROL-XL) 25 MG 24 hr tablet TAKE THREE TABLETS BY MOUTH ONCE DAILY 90 tablet 3  . nitroGLYCERIN (NITROSTAT) 0.4 MG SL tablet Place 1 tablet (0.4 mg total) under the tongue every 5 (five) minutes as needed. For chest pain 25 tablet 5  . Omega-3 Fatty Acids (EQL OMEGA 3 FISH OIL) 1400 MG CAPS Take 1 tablet by mouth daily.    . polyethylene glycol (MIRALAX / GLYCOLAX) packet Take 17 g by mouth daily.     . vitamin E (VITAMIN E) 400 UNIT capsule Take 400 Units by mouth daily.     No current facility-administered medications on file prior to visit.   Allergies  Allergen Reactions  . Atorvastatin Other (See Comments)    Muscle cramps  . Bactrim [Sulfamethoxazole-Trimethoprim] Other (See Comments)    Blisters/skin peeling, nausea  . Buprenorphine Hcl Nausea Only  . Morphine And  Related Nausea Only  . Prednisone Other (See Comments)    Unknown  . Sulfa Antibiotics Itching    Blisters/skin peeling  . Sulfonamide Derivatives Other (See Comments)    Blisters/skin peeling   Social History   Social History  . Marital Status: Married    Spouse Name: N/A  . Number of Children: N/A  . Years of Education: N/A   Occupational History  . Not on file.   Social History Main Topics  . Smoking status: Never Smoker   . Smokeless tobacco: Never Used  . Alcohol Use: Yes     Comment: occasional glass wine  . Drug Use: No  . Sexual Activity: Not on file   Other Topics Concern  . Not on file   Social History Narrative      Review of Systems  All other systems reviewed and are negative.      Objective:   Physical Exam  Cardiovascular: Normal  rate, regular rhythm and normal heart sounds.   Pulmonary/Chest: Effort normal and breath sounds normal. No respiratory distress. She has no wheezes. She has no rales.  Abdominal: Soft. Bowel sounds are normal.  Musculoskeletal:       Right shoulder: She exhibits decreased range of motion and tenderness.       Left shoulder: She exhibits decreased range of motion and tenderness.       Right knee: Tenderness found.       Left knee: Tenderness found.       Right hand: She exhibits tenderness and bony tenderness.       Left hand: She exhibits tenderness and bony tenderness.  Vitals reviewed.         Assessment & Plan:  Myalgia and myositis - Plan: CBC with Differential/Platelet, COMPLETE METABOLIC PANEL WITH GFR, CK, Sedimentation rate  Check a sedimentation rate to evaluate for polymyalgia rheumatica. Check a CK level to check for statin-induced myositis. Temporarily discontinue Crestor. If labs are normal, consider cortisone injection in the knees and in the shoulders for symptomatically relief of osteoarthritis. If the labs are elevated, consider prednisone for PMR. Also check a CBC and a CMP as the patient appears slightly dehydrated today

## 2016-05-01 LAB — COMPLETE METABOLIC PANEL WITH GFR
ALT: 23 U/L (ref 6–29)
AST: 31 U/L (ref 10–35)
Albumin: 4.3 g/dL (ref 3.6–5.1)
Alkaline Phosphatase: 49 U/L (ref 33–130)
BUN: 17 mg/dL (ref 7–25)
CALCIUM: 9 mg/dL (ref 8.6–10.4)
CHLORIDE: 106 mmol/L (ref 98–110)
CO2: 21 mmol/L (ref 20–31)
Creat: 0.95 mg/dL — ABNORMAL HIGH (ref 0.60–0.93)
GFR, EST AFRICAN AMERICAN: 70 mL/min (ref >=60)
GFR, EST NON AFRICAN AMERICAN: 61 mL/min (ref >=60)
Glucose, Bld: 81 mg/dL (ref 70–99)
POTASSIUM: 4 mmol/L (ref 3.5–5.3)
Sodium: 137 mmol/L (ref 135–146)
Total Bilirubin: 0.7 mg/dL (ref 0.2–1.2)
Total Protein: 6.8 g/dL (ref 6.1–8.1)

## 2016-05-01 LAB — CBC WITH DIFFERENTIAL/PLATELET
Basophils Absolute: 0 cells/uL (ref 0–200)
Basophils Relative: 0 %
EOS ABS: 0 {cells}/uL — AB (ref 15–500)
Eosinophils Relative: 0 %
HCT: 42 % (ref 35.0–45.0)
Hemoglobin: 14.6 g/dL (ref 12.0–15.0)
LYMPHS PCT: 9 %
Lymphs Abs: 486 cells/uL — ABNORMAL LOW (ref 850–3900)
MCH: 31.7 pg (ref 27.0–33.0)
MCHC: 34.8 g/dL (ref 32.0–36.0)
MCV: 91.1 fL (ref 80.0–100.0)
MPV: 11 fL (ref 7.5–12.5)
Monocytes Absolute: 540 cells/uL (ref 200–950)
Monocytes Relative: 10 %
NEUTROS PCT: 81 %
Neutro Abs: 4374 cells/uL (ref 1500–7800)
Platelets: 194 10*3/uL (ref 140–400)
RBC: 4.61 MIL/uL (ref 3.80–5.10)
RDW: 13.5 % (ref 11.0–15.0)
WBC: 5.4 10*3/uL (ref 3.8–10.8)

## 2016-05-01 LAB — SEDIMENTATION RATE: SED RATE: 8 mm/h (ref 0–30)

## 2016-05-01 LAB — CK: CK TOTAL: 67 U/L (ref 7–177)

## 2016-05-02 ENCOUNTER — Encounter (HOSPITAL_COMMUNITY): Payer: Self-pay | Admitting: *Deleted

## 2016-05-02 ENCOUNTER — Ambulatory Visit (HOSPITAL_COMMUNITY)
Admission: EM | Admit: 2016-05-02 | Discharge: 2016-05-02 | Disposition: A | Discharge status: Home / Self Care | Payer: Medicare Other | Attending: Emergency Medicine | Admitting: Emergency Medicine

## 2016-05-02 DIAGNOSIS — R252 Cramp and spasm: Secondary | ICD-10-CM

## 2016-05-02 DIAGNOSIS — E860 Dehydration: Secondary | ICD-10-CM

## 2016-05-02 DIAGNOSIS — Z87898 Personal history of other specified conditions: Secondary | ICD-10-CM | POA: Diagnosis not present

## 2016-05-02 DIAGNOSIS — M79606 Pain in leg, unspecified: Secondary | ICD-10-CM | POA: Diagnosis not present

## 2016-05-02 LAB — POCT I-STAT, CHEM 8
BUN: 18 mg/dL (ref 6–20)
CHLORIDE: 102 mmol/L (ref 101–111)
Calcium, Ion: 1.27 mmol/L — ABNORMAL HIGH (ref 1.12–1.23)
Creatinine, Ser: 0.8 mg/dL (ref 0.44–1.00)
Glucose, Bld: 100 mg/dL — ABNORMAL HIGH (ref 65–99)
HCT: 44 % (ref 36.0–46.0)
Hemoglobin: 15 g/dL (ref 12.0–15.0)
POTASSIUM: 3.9 mmol/L (ref 3.5–5.1)
SODIUM: 141 mmol/L (ref 135–145)
TCO2: 28 mmol/L (ref 0–100)

## 2016-05-02 LAB — POCT URINALYSIS DIP (DEVICE)
GLUCOSE, UA: NEGATIVE mg/dL
KETONES UR: 40 mg/dL — AB
Nitrite: NEGATIVE
Protein, ur: 30 mg/dL — AB
Specific Gravity, Urine: 1.02 (ref 1.005–1.030)
Urobilinogen, UA: 0.2 mg/dL (ref 0.0–1.0)
pH: 6 (ref 5.0–8.0)

## 2016-05-02 MED ORDER — SODIUM CHLORIDE 0.9 % IV BOLUS (SEPSIS)
500.0000 mL | Freq: Once | INTRAVENOUS | Status: AC
  Administered 2016-05-02: 500 mL via INTRAVENOUS

## 2016-05-02 NOTE — ED Notes (Signed)
Pt  Reports   symoptoms  Of  Bilateral  Leg  Cramps      Severe  At  Times     This     Am    mucous   Membranes  Are dry      denys  Any  Chest  Pain  Or shortness  Of  Breath  Pt  Is  Awake  As  Well  As  Alert   /  Oriented     She  Reports

## 2016-05-02 NOTE — ED Provider Notes (Signed)
CSN: [id number]     Arrival date & time 05/02/16  1208 History   First MD Initiated Contact with Patient 05/02/16 1257     Chief Complaint  Patient presents with  . Leg Problem   (Consider location/radiation/quality/duration/timing/severity/associated sxs/prior Treatment) HPI Comments: 70-year-old female process the urgent care with multiple complaints. The primary concern was that around noon today she developed severe muscle pains and cramping in the bilateral lower extremities at the same time. She was sitting on the commode. She felt like her legs on fire. Shortly after this started she developed some dizziness for just a few seconds. Following this her hands were feeling tingly for about 20 minutes and self resolved. Right now she feels well she is having no shortness of breath, chest pain or other chest discomfort, leg pain or other symptoms. She is fully awake, alert and oriented speaking clearly and smiling and sitting on the table and relaxed posturing. 3 days and 2 days ago she was sick on her stomach with nausea but no vomiting and had a poor oral intake. She was able to drink some fluids yesterday. She also notes that for the past couple of months she has been having some chest tightness a little more so than usual. It is self resolving. Occasionally she will have palpitations. As of now she is asymptomatic.   Past Medical History  Diagnosis Date  . DYSLIPIDEMIA   . Anxiety state, unspecified   . Essential hypertension, benign   . CORONARY ATHEROSCLEROSIS NATIVE CORONARY ARTERY   . CHEST PAIN UNSPECIFIED   . COLONIC POLYPS, HX OF    Past Surgical History  Procedure Laterality Date  . Coronary artery bypass graft  2009  . Nasal sinus surgery    . Polypectomy    . Craniotomy  04/27/2012    Procedure: CRANIOTOMY HEMATOMA EVACUATION SUBDURAL;  Surgeon: Eustace Moore, MD;  Location: [facility];  Service: Neurosurgery;  Laterality: Right;  Right Frontal Burr hole for Subdural  Hematoma  . Coronary angioplasty with stent placement  2010    3 times   Family History  Problem Relation Age of Onset  . Hypertension Mother   . Hypertension Brother   . Heart attack Father   . Stroke Maternal Grandmother    Social History  Substance Use Topics  . Smoking status: Never Smoker   . Smokeless tobacco: Never Used  . Alcohol Use: Yes     Comment: occasional glass wine   OB History    No data available     Review of Systems  Constitutional: Positive for activity change and appetite change. Negative for fever.  HENT: Negative.   Eyes: Negative.  Negative for visual disturbance.  Respiratory: Negative.   Cardiovascular:       Currently no chest pain, heaviness or tightness fullness, no palpitations.  Gastrointestinal: Negative.   Genitourinary: Negative.   Musculoskeletal: Positive for myalgias. Negative for back pain, arthralgias, neck pain and neck stiffness.  Skin: Negative.   Neurological: Positive for dizziness. Negative for tremors, syncope, speech difficulty, weakness, numbness and headaches.       See history of present illness. The symptoms are no longer occurring.  All other systems reviewed and are negative.   Allergies  Atorvastatin; Bactrim; Buprenorphine hcl; Morphine and related; Prednisone; Sulfa antibiotics; and Sulfonamide derivatives  Home Medications   Prior to Admission medications   Medication Sig Start Date End Date Taking? Authorizing Provider  Alpha-D-Galactosidase Satira Mccallum) TABS Take 2 tablets by mouth 3 (  three) times daily.    Historical Provider, MD  aspirin 81 MG tablet Take 81 mg by mouth daily.     Historical Provider, MD  Calcium Carbonate (CALTRATE 600 PO) Take 1 tablet by mouth once a week.    Historical Provider, MD  Cholecalciferol (VITAMIN D-3 PO) Take 1 tablet by mouth every other day.     Historical Provider, MD  Coenzyme Q10 (CO Q 10 PO) Take 1 capsule by mouth every other day.    Historical Provider, MD  CRESTOR 10 MG  tablet TAKE ONE TABLET BY MOUTH ONCE DAILY 01/21/16   Thayer Headings, MD  diphenhydrAMINE (BENADRYL) 25 MG tablet Take 25 mg by mouth at bedtime as needed. For sleep    Historical Provider, MD  diphenhydramine-acetaminophen (TYLENOL PM) 25-500 MG TABS Take 1 tablet by mouth at bedtime as needed. For insomnia/headache    Historical Provider, MD  ezetimibe (ZETIA) 10 MG tablet TAKE ONE TABLET BY MOUTH ONCE DAILY 02/18/16   Thayer Headings, MD  Fiber CHEW Chew 1 each by mouth daily.     Historical Provider, MD  FLAXSEED, LINSEED, PO Take 1 capsule by mouth daily.     Historical Provider, MD  losartan (COZAAR) 25 MG tablet TAKE ONE TABLET BY MOUTH ONCE DAILY 02/18/16   Thayer Headings, MD  metoprolol succinate (TOPROL-XL) 25 MG 24 hr tablet TAKE THREE TABLETS BY MOUTH ONCE DAILY 01/21/16   Susy Frizzle, MD  nitroGLYCERIN (NITROSTAT) 0.4 MG SL tablet Place 1 tablet (0.4 mg total) under the tongue every 5 (five) minutes as needed. For chest pain 01/21/16   Thayer Headings, MD  Omega-3 Fatty Acids (EQL OMEGA 3 FISH OIL) 1400 MG CAPS Take 1 tablet by mouth daily.    Historical Provider, MD  polyethylene glycol (MIRALAX / GLYCOLAX) packet Take 17 g by mouth daily.     Historical Provider, MD  vitamin E (VITAMIN E) 400 UNIT capsule Take 400 Units by mouth daily.    Historical Provider, MD   Meds Ordered and Administered this Visit   Medications  sodium chloride 0.9 % bolus 500 mL (500 mLs Intravenous Given 05/02/16 1438)    BP 142/76 mmHg  Pulse 78  Temp(Src) 98.6 F (37 C) (Oral)  Resp 18  SpO2 100% No data found.   Physical Exam  Constitutional: She is oriented to person, place, and time. She appears well-developed and well-nourished. No distress.  HENT:  Head: Normocephalic and atraumatic.  Mouth/Throat: Oropharynx is clear and moist. No oropharyngeal exudate.  Eyes: Conjunctivae and EOM are normal. Pupils are equal, round, and reactive to light.  Neck: Normal range of motion. Neck supple.   Cardiovascular: Normal rate, regular rhythm and normal heart sounds.   Pulmonary/Chest: Effort normal and breath sounds normal. No respiratory distress.  Abdominal: Soft. There is no tenderness.  Musculoskeletal: She exhibits no edema or tenderness.  Palpation of the lower extremity musculature reveals no tenderness. The muscles of both legs were soft. There is no swelling or tightness. No discoloration or other abnormalities seen. Pedal pulses were strong 2+. Negative Homans bilaterally. The pain in the legs cannot be reproduced by passive or active movement.  Lymphadenopathy:    She has no cervical adenopathy.  Neurological: She is alert and oriented to person, place, and time. She exhibits normal muscle tone.  Skin: Skin is warm and dry. No rash noted. No erythema.  Psychiatric: She has a normal mood and affect. Her behavior is normal.  Nursing  note and vitals reviewed.   ED Course  Procedures (including critical care time)  Labs Review Labs Reviewed  POCT I-STAT, CHEM 8 - Abnormal; Notable for the following:    Glucose, Bld 100 (*)    Calcium, Ion 1.27 (*)    All other components within normal limits  POCT URINALYSIS DIP (DEVICE) - Abnormal; Notable for the following:    Bilirubin Urine SMALL (*)    Ketones, ur 40 (*)    Hgb urine dipstick LARGE (*)    Protein, ur 30 (*)    Leukocytes, UA TRACE (*)    All other components within normal limits   Results for orders placed or performed during the hospital encounter of 05/02/16  I-STAT, chem 8  Result Value Ref Range   Sodium 141 135 - 145 mmol/L   Potassium 3.9 3.5 - 5.1 mmol/L   Chloride 102 101 - 111 mmol/L   BUN 18 6 - 20 mg/dL   Creatinine, Ser 0.80 0.44 - 1.00 mg/dL   Glucose, Bld 100 (H) 65 - 99 mg/dL   Calcium, Ion 1.27 (H) 1.12 - 1.23 mmol/L   TCO2 28 0 - 100 mmol/L   Hemoglobin 15.0 12.0 - 15.0 g/dL   HCT 44.0 36.0 - 46.0 %  POCT urinalysis dip (device)  Result Value Ref Range   Glucose, UA NEGATIVE NEGATIVE  mg/dL   Bilirubin Urine SMALL (A) NEGATIVE   Ketones, ur 40 (A) NEGATIVE mg/dL   Specific Gravity, Urine 1.020 1.005 - 1.030   Hgb urine dipstick LARGE (A) NEGATIVE   pH 6.0 5.0 - 8.0   Protein, ur 30 (A) NEGATIVE mg/dL   Urobilinogen, UA 0.2 0.0 - 1.0 mg/dL   Nitrite NEGATIVE NEGATIVE   Leukocytes, UA TRACE (A) NEGATIVE     Imaging Review No results found. ED ECG REPORT   Date: 05/02/2016  Rate: 60   Rhythm: normal sinus rhythm  QRS Axis: normal  Intervals: normal  ST/T Wave abnormalities: T-wave inversion in lead 3 only. No other ST segment changes.  Conduction Disutrbances:none  Narrative Interpretation:   Old EKG Reviewed: unchanged with the exception of the T-wave version as mentioned above.  I have personally reviewed the EKG tracing and agree with the computerized printout as noted.   Visual Acuity Review  Right Eye Distance:   Left Eye Distance:   Bilateral Distance:    Right Eye Near:   Left Eye Near:    Bilateral Near:         MDM   1. Bilateral leg cramps   2. Leg pain, diffuse, unspecified laterality   3. History of chest pain   4. Mild dehydration    Strongly recommended you call your primary care doctor on Monday for an appointment. He should make him aware of your leg pain, cramps and the fact that she get leg pain when walking. Also advise him of your occasional chest pains that you have at rest or other times. For worsening, shortness of breath, persistent chest pain or if he developed severe leg pain began directly to the emergency department or call 911.  Discuss patient's complaints, labs and EKG with Dr. Bridgett Larsson. Agreed that since she was stable now, asymptomatic and giving her some IV fluids that she was stable enough to go home. Also that if she develops any of the symptoms above to go to the ED or call 911.   Janne Napoleon, NP 05/02/16 1526

## 2016-05-02 NOTE — Discharge Instructions (Signed)
Strongly recommended you call your primary care doctor on Monday for an appointment. He should make him aware of your leg pain, cramps and the fact that she get leg pain when walking. Also advise him of your occasional chest pains that you have at rest or other times. For worsening, shortness of breath, persistent chest pain or if he developed severe leg pain began directly to the emergency department or call 911.  Dehydration Dehydration means your body does not have as much fluid as it needs. Your kidneys, brain, and heart will not work properly without the right amount of fluids and salt. Older adults are more likely to become dehydrated than younger adults. This is because:   Their bodies do not hold water as well.  Their bodies do not respond to temperature changes as well.  They do not get thirsty as easily or as quickly. HOME CARE  Ask your doctor how to replace body fluid losses (rehydrate).  Drink enough fluids to keep your pee (urine) clear or pale yellow.  Drink small amounts of fluids often if you feel sick to your stomach (nauseous) or throw up (vomit).  Eat like you normally do.  Avoid:  Foods or drinks high in sugar.  Bubbly (carbonated) drinks.  Juice.  Very hot or cold fluids.  Drinks with caffeine.  Fatty, greasy foods.  Alcohol.  Tobacco.  Eating too much.  Gelatin desserts.  Wash your hands to avoid spreading germs (bacteria, viruses).  Only take medicine as told by your doctor.  Keep all doctor visits as told. GET HELP IF:  You have belly (abdominal) pain that gets worse or stays in one spot (localizes).  You have a rash, stiff neck, or bad headache.  You get easily annoyed, sleepy, or are hard to wake up.  You feel weak, dizzy, or very thirsty.  You have a fever. GET HELP RIGHT AWAY IF:   You cannot drink fluid without throwing up.  You get worse even with treatment.  You throw up often.  You have watery poop (diarrhea)  often.  Your vomit has blood in it or looks greenish.  Your poop (stool) has blood in it or looks black and tarry.  You have not peed in 6 to 8 hours or have only peed a small amount of very dark pee.  You pass out (faint). MAKE SURE YOU:   Understand these instructions.  Will watch your condition.  Will get help right away if you are not doing well or get worse.   This information is not intended to replace advice given to you by your health care provider. Make sure you discuss any questions you have with your health care provider.   Document Released: 09/24/2011 Document Revised: 10/10/2013 Document Reviewed: 06/12/2013 Elsevier Interactive Patient Education 2016 [employer] Inc[employer]  Leg Cramps Leg cramps occur when a muscle or muscles tighten and you have no control over this tightening (involuntary muscle contraction). Muscle cramps can develop in any muscle, but the most common place is in the calf muscles of the leg. Those cramps can occur during exercise or when you are at rest. Leg cramps are painful, and they may last for a few seconds to a few minutes. Cramps may return several times before they finally stop. Usually, leg cramps are not caused by a serious medical problem. In many cases, the cause is not known. Some common causes include:  Overexertion.  Overuse from repetitive motions, or doing the same thing over and over.  Remaining in a certain position for a long period of time.  Improper preparation, form, or technique while performing a sport or an activity.  Dehydration.  Injury.  Side effects of some medicines.  Abnormally low levels of the salts and ions in your blood (electrolytes), especially potassium and calcium. These levels could be low if you are taking water pills (diuretics) or if you are pregnant. HOME CARE INSTRUCTIONS Watch your condition for any changes. Taking the following actions may help to lessen any discomfort that you are feeling:  Stay  well-hydrated. Drink enough fluid to keep your urine clear or pale yellow.  Try massaging, stretching, and relaxing the affected muscle. Do this for several minutes at a time.  For tight or tense muscles, use a warm towel, heating pad, or hot shower water directed to the affected area.  If you are sore or have pain after a cramp, applying ice to the affected area may relieve discomfort.  Put ice in a plastic bag.  Place a towel between your skin and the bag.  Leave the ice on for 20 minutes, 2-3 times per day.  Avoid strenuous exercise for several days if you have been having frequent leg cramps.  Make sure that your diet includes the essential minerals for your muscles to work normally.  Take medicines only as directed by your health care provider. SEEK MEDICAL CARE IF:  Your leg cramps get more severe or more frequent, or they do not improve over time.  Your foot becomes cold, numb, or blue.   This information is not intended to replace advice given to you by your health care provider. Make sure you discuss any questions you have with your health care provider.   Document Released: 11/12/2004 Document Revised: 02/19/2015 Document Reviewed: 09/12/2014 Elsevier Interactive Patient Education 2016 Elsevier Inc.   Rehydration, Elderly Rehydration is the replacement of body fluids lost during dehydration. Dehydration is an extreme loss of body fluids to the point of body function impairment. There are many ways extreme fluid loss can occur, including vomiting, diarrhea, or excess sweating. Recovering from dehydration requires replacing lost fluids, continuing to eat to maintain strength, and avoiding foods and beverages that may contribute to further fluid loss or may increase nausea. It is especially important for older adults to stay hydrated because dehydration can lead to dizziness and falls. Some factors that can contribute to dehydration are more common in the elderly, including  the use of prescription medicines and a decreased sensation of thirst.  HOW TO REHYDRATE In most cases, rehydration involves the replacement of not only fluids but also carbohydrates and basic body salts. Rehydration with an oral rehydration solution is one way to replace essential nutrients lost through dehydration. An oral rehydration solution can be purchased at pharmacies, retail stores, and online. Premixed packets of powder that you combine with water to make a solution are also sold. You can prepare an oral rehydration solution at home by mixing the following ingredients together:    - tsp table salt.   tsp baking soda.   tsp salt substitute containing potassium chloride.  1 tablespoons sugar.  1 L (34 oz) of water. Be sure to use exact measurements. Including too much sugar can make diarrhea worse. Drink -1 cup (120-240 mL) of oral rehydration solution each time you have diarrhea or vomit. If drinking this amount makes your vomiting worse, try drinking smaller amounts more often. For example, drink 1-3 tsp every 5-10 minutes.  A general rule for  staying hydrated is to drink 1-2 L of fluid per day. Talk to your caregiver about the specific amount you should be drinking each day. Drink enough fluids to keep your urine clear or pale yellow. EATING WHEN DEHYDRATED  Even if you have had severe sweating or you are having diarrhea, do not stop eating. Many healthy items in a normal diet are okay to continue eating while recovering from dehydration. The following tips can help you to lessen nausea when you eat:  Ask someone else to prepare your food. Cooking smells may worsen nausea.  Eat in a well-ventilated room away from cooking smells.  Sit up when you eat. Avoid lying down until 1-2 hours after eating.  Eat small amounts when you eat.  Eat foods that are easy to digest. These include soft, well-cooked, or mashed foods. FOODS AND BEVERAGES TO AVOID Avoid eating or drinking the  following foods and beverages that may increase nausea or further loss of fluid:   Fruit juices with a high sugar content, such as concentrated juices.  Alcohol.  Beverages containing caffeine.  Carbonated drinks. They may cause a lot of gas.  Foods that may cause a lot of gas, such as cabbage, broccoli, and beans.  Fatty, greasy, and fried foods.  Spicy, very salty, and very sweet foods or drinks.  Foods or drinks that are very hot or very cold. Consume food or drinks at or near room temperature.  Foods that need a lot of chewing, such as raw vegetables.  Foods that are sticky or hard to swallow, such as peanut butter.   This information is not intended to replace advice given to you by your health care provider. Make sure you discuss any questions you have with your health care provider.   Document Released: 12/28/2011 Document Revised: 06/29/2012 Document Reviewed: 12/28/2011 Elsevier Interactive Patient Education [employer].

## 2016-05-05 ENCOUNTER — Ambulatory Visit (INDEPENDENT_AMBULATORY_CARE_PROVIDER_SITE_OTHER): Payer: Medicare Other | Admitting: Family Medicine

## 2016-05-05 ENCOUNTER — Encounter: Payer: Self-pay | Admitting: Family Medicine

## 2016-05-05 VITALS — BP 122/74 | Temp 97.9°F | Wt 128.0 lb

## 2016-05-05 DIAGNOSIS — I2510 Atherosclerotic heart disease of native coronary artery without angina pectoris: Secondary | ICD-10-CM

## 2016-05-05 DIAGNOSIS — M791 Myalgia: Secondary | ICD-10-CM | POA: Diagnosis not present

## 2016-05-05 DIAGNOSIS — M609 Myositis, unspecified: Secondary | ICD-10-CM | POA: Diagnosis not present

## 2016-05-05 DIAGNOSIS — IMO0001 Reserved for inherently not codable concepts without codable children: Secondary | ICD-10-CM

## 2016-05-05 NOTE — Progress Notes (Signed)
Subjective:    Patient ID: Nancy [name], female    DOB: [birth year], 70 y.o.   MRN: [id number]  HPI 04/30/16 Patient feels terrible. She reports joint pains all over her body. Primarily she is having pains in all the joints of her hand. She was diagnosed as osteoarthritis at the hand clinic and was started on topical NSAIDs. She also complains of severe pain in both shoulders and pain with abduction in both shoulders. She is also complaining of some pain in both knees. She is complaining of forgetfulness. She feels weak and tired and has no desire to exercise.  At that time, my plan was: Check a sedimentation rate to evaluate for polymyalgia rheumatica. Check a CK level to check for statin-induced myositis. Temporarily discontinue Crestor. If labs are normal, consider cortisone injection in the knees and in the shoulders for symptomatically relief of osteoarthritis. If the labs are elevated, consider prednisone for PMR. Also check a CBC and a CMP as the patient appears slightly dehydrated today  Admission on 05/02/2016, Discharged on 05/02/2016  Component Date Value Ref Range Status  . Sodium 05/02/2016 141  135 - 145 mmol/L Final  . Potassium 05/02/2016 3.9  3.5 - 5.1 mmol/L Final  . Chloride 05/02/2016 102  101 - 111 mmol/L Final  . BUN 05/02/2016 18  6 - 20 mg/dL Final  . Creatinine, Ser 05/02/2016 0.80  0.44 - 1.00 mg/dL Final  . Glucose, Bld 05/02/2016 100* 65 - 99 mg/dL Final  . Calcium, Ion 05/02/2016 1.27* 1.12 - 1.23 mmol/L Final  . TCO2 05/02/2016 28  0 - 100 mmol/L Final  . Hemoglobin 05/02/2016 15.0  12.0 - 15.0 g/dL Final  . HCT 05/02/2016 44.0  36.0 - 46.0 % Final  . Glucose, UA 05/02/2016 NEGATIVE  NEGATIVE mg/dL Final  . Bilirubin Urine 05/02/2016 SMALL* NEGATIVE Final  . Ketones, ur 05/02/2016 40* NEGATIVE mg/dL Final  . Specific Gravity, Urine 05/02/2016 1.020  1.005 - 1.030 Final  . Hgb urine dipstick 05/02/2016 LARGE* NEGATIVE Final  . pH 05/02/2016 6.0  5.0 - 8.0  Final  . Protein, ur 05/02/2016 30* NEGATIVE mg/dL Final  . Urobilinogen, UA 05/02/2016 0.2  0.0 - 1.0 mg/dL Final  . Nitrite 05/02/2016 NEGATIVE  NEGATIVE Final  . Leukocytes, UA 05/02/2016 TRACE* NEGATIVE Final   Biochemical Testing Only. Please order routine urinalysis from main lab if confirmatory testing is needed.  Office Visit on 04/30/2016  Component Date Value Ref Range Status  . WBC 04/30/2016 5.4  3.8 - 10.8 K/uL Final  . RBC 04/30/2016 4.61  3.80 - 5.10 MIL/uL Final  . Hemoglobin 04/30/2016 14.6  12.0 - 15.0 g/dL Final  . HCT 04/30/2016 42.0  35.0 - 45.0 % Final  . MCV 04/30/2016 91.1  80.0 - 100.0 fL Final  . MCH 04/30/2016 31.7  27.0 - 33.0 pg Final  . MCHC 04/30/2016 34.8  32.0 - 36.0 g/dL Final  . RDW 04/30/2016 13.5  11.0 - 15.0 % Final  . Platelets 04/30/2016 194  140 - 400 K/uL Final  . MPV 04/30/2016 11.0  7.5 - 12.5 fL Final  . Neutro Abs 04/30/2016 4374  1500 - 7800 cells/uL Final  . Lymphs Abs 04/30/2016 486* 850 - 3900 cells/uL Final  . Monocytes Absolute 04/30/2016 540  200 - 950 cells/uL Final  . Eosinophils Absolute 04/30/2016 0* 15 - 500 cells/uL Final  . Basophils Absolute 04/30/2016 0  0 - 200 cells/uL Final  . Neutrophils Relative % 04/30/2016 81  Final  . Lymphocytes Relative 04/30/2016 9   Final  . Monocytes Relative 04/30/2016 10   Final  . Eosinophils Relative 04/30/2016 0   Final  . Basophils Relative 04/30/2016 0   Final  . Smear Review 04/30/2016 Criteria for review not met   Final   ** Please note change in unit of measure and reference range(s). **  . Sodium 04/30/2016 137  135 - 146 mmol/L Final  . Potassium 04/30/2016 4.0  3.5 - 5.3 mmol/L Final  . Chloride 04/30/2016 106  98 - 110 mmol/L Final  . CO2 04/30/2016 21  20 - 31 mmol/L Final  . Glucose, Bld 04/30/2016 81  70 - 99 mg/dL Final  . BUN 04/30/2016 17  7 - 25 mg/dL Final  . Creat 04/30/2016 0.95* 0.60 - 0.93 mg/dL Final   Comment:   For patients > or = 70 years of age: The upper  reference limit for Creatinine is approximately 13% higher for people identified as African-American.     . Total Bilirubin 04/30/2016 0.7  0.2 - 1.2 mg/dL Final  . Alkaline Phosphatase 04/30/2016 49  33 - 130 U/L Final  . AST 04/30/2016 31  10 - 35 U/L Final  . ALT 04/30/2016 23  6 - 29 U/L Final  . Total Protein 04/30/2016 6.8  6.1 - 8.1 g/dL Final  . Albumin 04/30/2016 4.3  3.6 - 5.1 g/dL Final  . Calcium 04/30/2016 9.0  8.6 - 10.4 mg/dL Final  . GFR, Est African American 04/30/2016 70  >=60 mL/min Final  . GFR, Est Non African American 04/30/2016 61  >=60 mL/min Final  . Total CK 04/30/2016 67  7 - 177 U/L Final  . Sed Rate 04/30/2016 8  0 - 30 mm/hr Final   05/05/2016  Patient went to an urgent care shortly after I saw her for severe muscle cramps in both legs. She was given IV fluids for presumed dehydration. The muscle cramps of subsided. Since stopping the Crestor the pains all over her body have improved dramatically. She still is sore but she is starting to feel closer back to normal. She reports some burning in her legs but she denies any numbness. She denies any shooting nerve like pain radiating down her legs. Today on examination her muscle strength is 5 over 5 equal and symmetric in both legs with no evidence of neurologic deficit. She has palpable dorsalis pedis and popliteal pulses bilaterally. There is no evidence of claudication or symptoms of claudication in her history Past Medical History  Diagnosis Date  . DYSLIPIDEMIA   . Anxiety state, unspecified   . Essential hypertension, benign   . CORONARY ATHEROSCLEROSIS NATIVE CORONARY ARTERY   . CHEST PAIN UNSPECIFIED   . COLONIC POLYPS, HX OF    Past Surgical History  Procedure Laterality Date  . Coronary artery bypass graft  2009  . Nasal sinus surgery    . Polypectomy    . Craniotomy  04/27/2012    Procedure: CRANIOTOMY HEMATOMA EVACUATION SUBDURAL;  Surgeon: Eustace Moore, MD;  Location: [facility] [facility];  Service:  Neurosurgery;  Laterality: Right;  Right Frontal Burr hole for Subdural Hematoma  . Coronary angioplasty with stent placement  2010    3 times   Current Outpatient Prescriptions on File Prior to Visit  Medication Sig Dispense Refill  . Alpha-D-Galactosidase (BEANO) TABS Take 2 tablets by mouth 3 (three) times daily.    Marland Kitchen aspirin 81 MG tablet Take 81 mg by mouth daily.     Marland Kitchen  Calcium Carbonate (CALTRATE 600 PO) Take 1 tablet by mouth once a week.    . Cholecalciferol (VITAMIN D-3 PO) Take 1 tablet by mouth every other day.     . Coenzyme Q10 (CO Q 10 PO) Take 1 capsule by mouth every other day.    . CRESTOR 10 MG tablet TAKE ONE TABLET BY MOUTH ONCE DAILY 90 tablet 3  . diphenhydrAMINE (BENADRYL) 25 MG tablet Take 25 mg by mouth at bedtime as needed. For sleep    . diphenhydramine-acetaminophen (TYLENOL PM) 25-500 MG TABS Take 1 tablet by mouth at bedtime as needed. For insomnia/headache    . ezetimibe (ZETIA) 10 MG tablet TAKE ONE TABLET BY MOUTH ONCE DAILY 30 tablet 11  . Fiber CHEW Chew 1 each by mouth daily.     Marland Kitchen FLAXSEED, LINSEED, PO Take 1 capsule by mouth daily.     Marland Kitchen losartan (COZAAR) 25 MG tablet TAKE ONE TABLET BY MOUTH ONCE DAILY 30 tablet 11  . metoprolol succinate (TOPROL-XL) 25 MG 24 hr tablet TAKE THREE TABLETS BY MOUTH ONCE DAILY 90 tablet 3  . nitroGLYCERIN (NITROSTAT) 0.4 MG SL tablet Place 1 tablet (0.4 mg total) under the tongue every 5 (five) minutes as needed. For chest pain 25 tablet 5  . Omega-3 Fatty Acids (EQL OMEGA 3 FISH OIL) 1400 MG CAPS Take 1 tablet by mouth daily.    . polyethylene glycol (MIRALAX / GLYCOLAX) packet Take 17 g by mouth daily.     . vitamin E (VITAMIN E) 400 UNIT capsule Take 400 Units by mouth daily.     No current facility-administered medications on file prior to visit.   Allergies  Allergen Reactions  . Atorvastatin Other (See Comments)    Muscle cramps  . Bactrim [Sulfamethoxazole-Trimethoprim] Other (See Comments)    Blisters/skin  peeling, nausea  . Buprenorphine Hcl Nausea Only  . Morphine And Related Nausea Only  . Prednisone Other (See Comments)    Unknown  . Sulfa Antibiotics Itching    Blisters/skin peeling  . Sulfonamide Derivatives Other (See Comments)    Blisters/skin peeling   Social History   Social History  . Marital Status: Married    Spouse Name: N/A  . Number of Children: N/A  . Years of Education: N/A   Occupational History  . Not on file.   Social History Main Topics  . Smoking status: Never Smoker   . Smokeless tobacco: Never Used  . Alcohol Use: Yes     Comment: occasional glass wine  . Drug Use: No  . Sexual Activity: Not on file   Other Topics Concern  . Not on file   Social History Narrative      Review of Systems  All other systems reviewed and are negative.      Objective:   Physical Exam  Cardiovascular: Normal rate, regular rhythm and normal heart sounds.   Pulmonary/Chest: Effort normal and breath sounds normal. No respiratory distress. She has no wheezes. She has no rales.  Abdominal: Soft. Bowel sounds are normal.  Musculoskeletal:       Right shoulder: She exhibits decreased range of motion and tenderness.       Left shoulder: She exhibits decreased range of motion and tenderness.       Right knee: Tenderness found.       Left knee: Tenderness found.       Right hand: She exhibits tenderness and bony tenderness.       Left hand: She exhibits tenderness  and bony tenderness.  Vitals reviewed.         Assessment & Plan:  Myalgia and myositis  Symptoms are improving dramatically after IV fluids and stopping her statin medication. I recommended continual clinical monitoring for the next 7-10 days. If her symptoms continue to improve no further workup is necessary. If her symptoms worsen, consider an MRI of the lumbar spine to evaluate for neurogenic claudication and also EMGs and nerve conduction studies to evaluate for any myopathies.

## 2016-05-11 ENCOUNTER — Other Ambulatory Visit: Payer: Medicare Other

## 2016-05-15 ENCOUNTER — Ambulatory Visit: Payer: Medicare Other | Admitting: Family Medicine

## 2016-05-19 ENCOUNTER — Other Ambulatory Visit: Payer: Self-pay | Admitting: Family Medicine

## 2016-05-28 ENCOUNTER — Other Ambulatory Visit: Payer: Self-pay | Admitting: Family Medicine

## 2016-05-28 DIAGNOSIS — Z1231 Encounter for screening mammogram for malignant neoplasm of breast: Secondary | ICD-10-CM

## 2016-06-03 ENCOUNTER — Ambulatory Visit
Admission: RE | Admit: 2016-06-03 | Discharge: 2016-06-03 | Disposition: A | Discharge status: Home / Self Care | Payer: Medicare Other | Source: Ambulatory Visit | Attending: Family Medicine | Admitting: Family Medicine

## 2016-06-03 DIAGNOSIS — Z1231 Encounter for screening mammogram for malignant neoplasm of breast: Secondary | ICD-10-CM | POA: Diagnosis not present

## 2016-06-12 ENCOUNTER — Other Ambulatory Visit: Payer: Self-pay

## 2016-06-18 ENCOUNTER — Ambulatory Visit: Payer: Medicare Other | Admitting: Family Medicine

## 2016-06-19 ENCOUNTER — Encounter: Payer: Self-pay | Admitting: Cardiovascular Disease

## 2016-06-23 ENCOUNTER — Other Ambulatory Visit: Payer: Self-pay | Admitting: Family Medicine

## 2016-06-23 ENCOUNTER — Other Ambulatory Visit: Payer: Medicare Other

## 2016-06-23 DIAGNOSIS — E785 Hyperlipidemia, unspecified: Secondary | ICD-10-CM | POA: Diagnosis not present

## 2016-06-23 DIAGNOSIS — I10 Essential (primary) hypertension: Secondary | ICD-10-CM

## 2016-06-23 DIAGNOSIS — M810 Age-related osteoporosis without current pathological fracture: Secondary | ICD-10-CM | POA: Diagnosis not present

## 2016-06-23 DIAGNOSIS — Z79899 Other long term (current) drug therapy: Secondary | ICD-10-CM

## 2016-06-23 LAB — CBC WITH DIFFERENTIAL/PLATELET
Basophils Absolute: 46 {cells}/uL (ref 0–200)
Basophils Relative: 1 %
Eosinophils Absolute: 138 {cells}/uL (ref 15–500)
Eosinophils Relative: 3 %
HCT: 39.4 % (ref 35.0–45.0)
Hemoglobin: 13.1 g/dL (ref 12.0–15.0)
Lymphocytes Relative: 36 %
Lymphs Abs: 1656 {cells}/uL (ref 850–3900)
MCH: 31 pg (ref 27.0–33.0)
MCHC: 33.2 g/dL (ref 32.0–36.0)
MCV: 93.1 fL (ref 80.0–100.0)
MPV: 11 fL (ref 7.5–12.5)
Monocytes Absolute: 414 {cells}/uL (ref 200–950)
Monocytes Relative: 9 %
Neutro Abs: 2346 {cells}/uL (ref 1500–7800)
Neutrophils Relative %: 51 %
Platelets: 208 K/uL (ref 140–400)
RBC: 4.23 MIL/uL (ref 3.80–5.10)
RDW: 13.6 % (ref 11.0–15.0)
WBC: 4.6 K/uL (ref 3.8–10.8)

## 2016-06-23 LAB — LIPID PANEL
Cholesterol: 107 mg/dL — ABNORMAL LOW (ref 125–200)
HDL: 54 mg/dL
LDL Cholesterol: 39 mg/dL
Total CHOL/HDL Ratio: 2 ratio
Triglycerides: 70 mg/dL
VLDL: 14 mg/dL

## 2016-06-23 LAB — COMPLETE METABOLIC PANEL WITHOUT GFR
ALT: 17 U/L (ref 6–29)
AST: 21 U/L (ref 10–35)
Albumin: 4.2 g/dL (ref 3.6–5.1)
Alkaline Phosphatase: 46 U/L (ref 33–130)
BUN: 16 mg/dL (ref 7–25)
CO2: 22 mmol/L (ref 20–31)
Calcium: 8.7 mg/dL (ref 8.6–10.4)
Chloride: 106 mmol/L (ref 98–110)
Creat: 0.94 mg/dL — ABNORMAL HIGH (ref 0.60–0.93)
GFR, Est African American: 71 mL/min
GFR, Est Non African American: 62 mL/min
Glucose, Bld: 82 mg/dL (ref 70–99)
Potassium: 4.8 mmol/L (ref 3.5–5.3)
Sodium: 140 mmol/L (ref 135–146)
Total Bilirubin: 0.5 mg/dL (ref 0.2–1.2)
Total Protein: 6.6 g/dL (ref 6.1–8.1)

## 2016-06-23 LAB — TSH: TSH: 2.51 m[IU]/L

## 2016-06-24 DIAGNOSIS — H2513 Age-related nuclear cataract, bilateral: Secondary | ICD-10-CM | POA: Diagnosis not present

## 2016-06-24 DIAGNOSIS — H04123 Dry eye syndrome of bilateral lacrimal glands: Secondary | ICD-10-CM | POA: Diagnosis not present

## 2016-06-24 LAB — VITAMIN D 25 HYDROXY (VIT D DEFICIENCY, FRACTURES): Vit D, 25-Hydroxy: 60 ng/mL (ref 30–100)

## 2016-06-29 ENCOUNTER — Ambulatory Visit (INDEPENDENT_AMBULATORY_CARE_PROVIDER_SITE_OTHER): Payer: Medicare Other | Admitting: Family Medicine

## 2016-06-29 ENCOUNTER — Encounter: Payer: Self-pay | Admitting: Family Medicine

## 2016-06-29 VITALS — BP 126/62 | HR 68 | Temp 98.2°F | Resp 14 | Ht 65.0 in | Wt 129.0 lb

## 2016-06-29 DIAGNOSIS — E785 Hyperlipidemia, unspecified: Secondary | ICD-10-CM | POA: Diagnosis not present

## 2016-06-29 DIAGNOSIS — I10 Essential (primary) hypertension: Secondary | ICD-10-CM | POA: Diagnosis not present

## 2016-06-29 DIAGNOSIS — I2510 Atherosclerotic heart disease of native coronary artery without angina pectoris: Secondary | ICD-10-CM

## 2016-06-29 DIAGNOSIS — IMO0001 Reserved for inherently not codable concepts without codable children: Secondary | ICD-10-CM

## 2016-06-29 DIAGNOSIS — Z8679 Personal history of other diseases of the circulatory system: Secondary | ICD-10-CM

## 2016-06-29 DIAGNOSIS — M609 Myositis, unspecified: Secondary | ICD-10-CM

## 2016-06-29 DIAGNOSIS — M791 Myalgia: Secondary | ICD-10-CM | POA: Diagnosis not present

## 2016-06-29 NOTE — Progress Notes (Signed)
Subjective:    Patient ID: Nancy [name], female    DOB: November 11, 1945, 70 y.o.   MRN: [id number]  HPI  04/30/16 Patient feels terrible. She reports joint pains all over her body. Primarily she is having pains in all the joints of her hand. She was diagnosed as osteoarthritis at the hand clinic and was started on topical NSAIDs. She also complains of severe pain in both shoulders and pain with abduction in both shoulders. She is also complaining of some pain in both knees. She is complaining of forgetfulness. She feels weak and tired and has no desire to exercise.  At that time, my plan was: Check a sedimentation rate to evaluate for polymyalgia rheumatica. Check a CK level to check for statin-induced myositis. Temporarily discontinue Crestor. If labs are normal, consider cortisone injection in the knees and in the shoulders for symptomatically relief of osteoarthritis. If the labs are elevated, consider prednisone for PMR. Also check a CBC and a CMP as the patient appears slightly dehydrated today   05/05/2016  Patient went to an urgent care shortly after I saw her for severe muscle cramps in both legs. She was given IV fluids for presumed dehydration. The muscle cramps of subsided. Since stopping the Crestor the pains all over her body have improved dramatically. She still is sore but she is starting to feel closer back to normal. She reports some burning in her legs but she denies any numbness. She denies any shooting nerve like pain radiating down her legs. Today on examination her muscle strength is 5 over 5 equal and symmetric in both legs with no evidence of neurologic deficit. She has palpable dorsalis pedis and popliteal pulses bilaterally. There is no evidence of claudication or symptoms of claudication in her history.  At that time, my plan was: Symptoms are improving dramatically after IV fluids and stopping her statin medication. I recommended continual clinical monitoring for the next 7-10  days. If her symptoms continue to improve no further workup is necessary. If her symptoms worsen, consider an MRI of the lumbar spine to evaluate for neurogenic claudication and also EMGs and nerve conduction studies to evaluate for any myopathies.    06/29/16 Most recent labs are below: Appointment on 06/23/2016  Component Date Value Ref Range Status  . Sodium 06/23/2016 140  135 - 146 mmol/L Final  . Potassium 06/23/2016 4.8  3.5 - 5.3 mmol/L Final  . Chloride 06/23/2016 106  98 - 110 mmol/L Final  . CO2 06/23/2016 22  20 - 31 mmol/L Final  . Glucose, Bld 06/23/2016 82  70 - 99 mg/dL Final  . BUN 06/23/2016 16  7 - 25 mg/dL Final  . Creat 06/23/2016 0.94* 0.60 - 0.93 mg/dL Final   Comment:   For patients > or = 70 years of age: The upper reference limit for Creatinine is approximately 13% higher for people identified as African-American.     . Total Bilirubin 06/23/2016 0.5  0.2 - 1.2 mg/dL Final  . Alkaline Phosphatase 06/23/2016 46  33 - 130 U/L Final  . AST 06/23/2016 21  10 - 35 U/L Final  . ALT 06/23/2016 17  6 - 29 U/L Final  . Total Protein 06/23/2016 6.6  6.1 - 8.1 g/dL Final  . Albumin 06/23/2016 4.2  3.6 - 5.1 g/dL Final  . Calcium 06/23/2016 8.7  8.6 - 10.4 mg/dL Final  . GFR, Est African American 06/23/2016 71  >=60 mL/min Final  . GFR, Est Non African  American 06/23/2016 62  >=60 mL/min Final  . TSH 06/23/2016 2.51  mIU/L Final   Comment:   Reference Range   > or = 20 Years  0.40-4.50   Pregnancy Range First trimester  0.26-2.66 Second trimester 0.55-2.73 Third trimester  0.43-2.91     . Cholesterol 06/23/2016 107* 125 - 200 mg/dL Final  . Triglycerides 06/23/2016 70  <150 mg/dL Final  . HDL 06/23/2016 54  >=46 mg/dL Final  . Total CHOL/HDL Ratio 06/23/2016 2.0  <=5.0 Ratio Final  . VLDL 06/23/2016 14  <30 mg/dL Final  . LDL Cholesterol 06/23/2016 39  <130 mg/dL Final   Comment:   Total Cholesterol/HDL Ratio:CHD Risk                        Coronary Heart  Disease Risk Table                                        Men       Women          1/2 Average Risk              3.4        3.3              Average Risk              5.0        4.4           2X Average Risk              9.6        7.1           3X Average Risk             23.4       11.0 Use the calculated Patient Ratio above and the CHD Risk table  to determine the patient's CHD Risk.   . WBC 06/23/2016 4.6  3.8 - 10.8 K/uL Final  . RBC 06/23/2016 4.23  3.80 - 5.10 MIL/uL Final  . Hemoglobin 06/23/2016 13.1  12.0 - 15.0 g/dL Final  . HCT 06/23/2016 39.4  35.0 - 45.0 % Final  . MCV 06/23/2016 93.1  80.0 - 100.0 fL Final  . MCH 06/23/2016 31.0  27.0 - 33.0 pg Final  . MCHC 06/23/2016 33.2  32.0 - 36.0 g/dL Final  . RDW 06/23/2016 13.6  11.0 - 15.0 % Final  . Platelets 06/23/2016 208  140 - 400 K/uL Final  . MPV 06/23/2016 11.0  7.5 - 12.5 fL Final  . Neutro Abs 06/23/2016 2346  1,500 - 7,800 cells/uL Final  . Lymphs Abs 06/23/2016 1656  850 - 3,900 cells/uL Final  . Monocytes Absolute 06/23/2016 414  200 - 950 cells/uL Final  . Eosinophils Absolute 06/23/2016 138  15 - 500 cells/uL Final  . Basophils Absolute 06/23/2016 46  0 - 200 cells/uL Final  . Neutrophils Relative % 06/23/2016 51  % Final  . Lymphocytes Relative 06/23/2016 36  % Final  . Monocytes Relative 06/23/2016 9  % Final  . Eosinophils Relative 06/23/2016 3  % Final  . Basophils Relative 06/23/2016 1  % Final  . Smear Review 06/23/2016 Criteria for review not met   Final  . Vit D, 25-Hydroxy 06/24/2016 60  30 - 100 ng/mL Final   Comment: Vitamin D Status  25-OH Vitamin D        Deficiency                <20 ng/mL        Insufficiency         20 - 29 ng/mL        Optimal             > or = 30 ng/mL   For 25-OH Vitamin D testing on patients on D2-supplementation and patients for whom quantitation of D2 and D3 fractions is required, the [facility] 25-OH VIT D, (D2,D3), LC/MS/MS is recommended: order  code [id number] (patients > 2 yrs).      Since I last saw the patient, she resumed her Crestor and her Zetia. However she is doing well with no concerns on the medication. Her cholesterol is outstanding. She prefers to defer her flu shot later this year. Hasn't time she denies any muscle aches. She does complain of some memory loss. She has a history of craniotomy to remove the subdural hematoma in 2013. She's had some memory loss since that time. However on her Mini-Mental status exam today she scored 30 out of 30. The issue she seen have been problems with remembering where she put objects.  For instance, she misplaced $1200 in her home. She also occasionally will forget her road name.   Past Medical History:  Diagnosis Date  . Anxiety state, unspecified   . CHEST PAIN UNSPECIFIED   . COLONIC POLYPS, HX OF   . CORONARY ATHEROSCLEROSIS NATIVE CORONARY ARTERY   . DYSLIPIDEMIA   . Essential hypertension, benign    Past Surgical History:  Procedure Laterality Date  . CORONARY ANGIOPLASTY WITH STENT PLACEMENT  2010   3 times  . CORONARY ARTERY BYPASS GRAFT  2009  . CRANIOTOMY  04/27/2012   Procedure: CRANIOTOMY HEMATOMA EVACUATION SUBDURAL;  Surgeon: Eustace Moore, MD;  Location: [place] [facility];  Service: Neurosurgery;  Laterality: Right;  Right Frontal Burr hole for Subdural Hematoma  . NASAL SINUS SURGERY    . POLYPECTOMY     Current Outpatient Prescriptions on File Prior to Visit  Medication Sig Dispense Refill  . Alpha-D-Galactosidase (BEANO) TABS Take 2 tablets by mouth 3 (three) times daily.    Marland Kitchen aspirin 81 MG tablet Take 81 mg by mouth daily.     . Calcium Carbonate (CALTRATE 600 PO) Take 1 tablet by mouth once a week.    . Cholecalciferol (VITAMIN D-3 PO) Take 1 tablet by mouth every other day.     . Coenzyme Q10 (CO Q 10 PO) Take 1 capsule by mouth every other day.    . CRESTOR 10 MG tablet TAKE ONE TABLET BY MOUTH ONCE DAILY 90 tablet 3  . diphenhydrAMINE (BENADRYL) 25 MG tablet Take  25 mg by mouth at bedtime as needed. For sleep    . diphenhydramine-acetaminophen (TYLENOL PM) 25-500 MG TABS Take 1 tablet by mouth at bedtime as needed. For insomnia/headache    . ezetimibe (ZETIA) 10 MG tablet TAKE ONE TABLET BY MOUTH ONCE DAILY 30 tablet 11  . Fiber CHEW Chew 1 each by mouth daily.     Marland Kitchen FLAXSEED, LINSEED, PO Take 1 capsule by mouth daily.     Marland Kitchen losartan (COZAAR) 25 MG tablet TAKE ONE TABLET BY MOUTH ONCE DAILY 30 tablet 11  . metoprolol succinate (TOPROL-XL) 25 MG 24 hr tablet TAKE THREE TABLETS BY MOUTH ONCE DAILY 90 tablet 3  . nitroGLYCERIN (NITROSTAT) 0.4  MG SL tablet Place 1 tablet (0.4 mg total) under the tongue every 5 (five) minutes as needed. For chest pain 25 tablet 5  . Omega-3 Fatty Acids (EQL OMEGA 3 FISH OIL) 1400 MG CAPS Take 1 tablet by mouth daily.    . polyethylene glycol (MIRALAX / GLYCOLAX) packet Take 17 g by mouth daily.     . vitamin E (VITAMIN E) 400 UNIT capsule Take 400 Units by mouth daily.     No current facility-administered medications on file prior to visit.    Allergies  Allergen Reactions  . Atorvastatin Other (See Comments)    Muscle cramps  . Bactrim [Sulfamethoxazole-Trimethoprim] Other (See Comments)    Blisters/skin peeling, nausea  . Buprenorphine Hcl Nausea Only  . Morphine And Related Nausea Only  . Prednisone Other (See Comments)    Unknown  . Sulfa Antibiotics Itching    Blisters/skin peeling  . Sulfonamide Derivatives Other (See Comments)    Blisters/skin peeling   Social History   Social History  . Marital status: Married    Spouse name: N/A  . Number of children: N/A  . Years of education: N/A   Occupational History  . Not on file.   Social History Main Topics  . Smoking status: Never Smoker  . Smokeless tobacco: Never Used  . Alcohol use Yes     Comment: occasional glass wine  . Drug use: No  . Sexual activity: Not on file   Other Topics Concern  . Not on file   Social History Narrative  . No  narrative on file      Review of Systems  All other systems reviewed and are negative.      Objective:   Physical Exam  Constitutional: She appears well-developed and well-nourished. No distress.  Neck: Neck supple. No JVD present. No thyromegaly present.  Cardiovascular: Normal rate, regular rhythm and normal heart sounds.  Exam reveals no gallop and no friction rub.   No murmur heard. Pulmonary/Chest: Effort normal and breath sounds normal. No respiratory distress. She has no wheezes. She has no rales.  Abdominal: Soft. Bowel sounds are normal. She exhibits no distension. There is no tenderness. There is no rebound and no guarding.  Lymphadenopathy:    She has no cervical adenopathy.  Skin: She is not diaphoretic.  Vitals reviewed.         Assessment & Plan:  HLD (hyperlipidemia)  Essential hypertension  History of ASCVD  Dyslipidemia  Myalgia and myositis Blood pressure is excellent. Cholesterol is excellent. Recommended flu shot but she declined. Discontinue Zetia. Recheck in 6 months or as needed

## 2016-07-06 ENCOUNTER — Ambulatory Visit (INDEPENDENT_AMBULATORY_CARE_PROVIDER_SITE_OTHER): Payer: Medicare Other | Admitting: Cardiovascular Disease

## 2016-07-06 ENCOUNTER — Encounter: Payer: Self-pay | Admitting: Cardiovascular Disease

## 2016-07-06 VITALS — BP 110/54 | HR 70 | Ht 65.0 in | Wt 130.4 lb

## 2016-07-06 DIAGNOSIS — I2510 Atherosclerotic heart disease of native coronary artery without angina pectoris: Secondary | ICD-10-CM | POA: Diagnosis not present

## 2016-07-06 DIAGNOSIS — E785 Hyperlipidemia, unspecified: Secondary | ICD-10-CM

## 2016-07-06 NOTE — Patient Instructions (Signed)

## 2016-07-06 NOTE — Progress Notes (Signed)
Cardiology Office Note   Date:  07/06/2016   ID:  Nancy [name], Nancy [name] [birth year], MRN [id number]  PCP:  Odette Fraction, MD  Cardiologist:   Mertie Moores, MD   Chief Complaint  Patient presents with  . Coronary Artery Disease   1. Coronary artery disease-status post coronary artery bypass graft- 2008 Status post PCI in 2010. 2. craniotomy for subdural hematoma. 68.  hyperlipidemia-    [name] is doing well from a cardiac standpoint.  She is having lots of leg cramps. Takes Tylenol PM each night for cramps. She's tried taking mustard and this does not help. Her atorvastatin dose has been cut in 1/2 but she continues to have joint pain, muscle ache. She also has numbness in her feet,  Has lots of ankle, knee and hip pain    January 03, 2015:   Nancy [name] is a 70 y.o. female who presents for follow-up of her coronary artery disease.  Sept. 13, 2016:  Doing well.  Normal stress myoview in March , 2016, Lipids looked great  Feeling well.  Not sleeping well.   January 07, 2106:  Has had some palpitations when she lies on her left side to sleep  Has worn the monitor in the past Does not want to necessarily wear it again  Has bilateral hand splints on - arthritis   No CP or dyspnea. Has slowed down with her walking  - walking slower and perhaps not as far.   Sept. 18, 2017  Has occasional irreg HR when she lays down on her left side.   Able to do her normal activities without cardiac problems . No CP or dyspnea . Lots of arthritis symptoms . Has some memory loss following her subdural hematoma    Past Medical History:  Diagnosis Date  . Anxiety state, unspecified   . CHEST PAIN UNSPECIFIED   . COLONIC POLYPS, HX OF   . CORONARY ATHEROSCLEROSIS NATIVE CORONARY ARTERY   . DYSLIPIDEMIA   . Essential hypertension, benign     Past Surgical History:  Procedure Laterality Date  . CORONARY ANGIOPLASTY WITH STENT PLACEMENT  2010   3 times    . CORONARY ARTERY BYPASS GRAFT  2009  . CRANIOTOMY  04/27/2012   Procedure: CRANIOTOMY HEMATOMA EVACUATION SUBDURAL;  Surgeon: Eustace Moore, MD;  Location: [facility];  Service: Neurosurgery;  Laterality: Right;  Right Frontal Burr hole for Subdural Hematoma  . NASAL SINUS SURGERY    . POLYPECTOMY       Current Outpatient Prescriptions  Medication Sig Dispense Refill  . Alpha-D-Galactosidase (BEANO) TABS Take 2 tablets by mouth 3 (three) times daily.    Marland Kitchen aspirin 81 MG tablet Take 81 mg by mouth daily.     . Cholecalciferol (VITAMIN D-3 PO) Take 1 tablet by mouth every other day.     . Coenzyme Q10 (CO Q 10 PO) Take 1 capsule by mouth every other day.    . CRESTOR 10 MG tablet TAKE ONE TABLET BY MOUTH ONCE DAILY 90 tablet 3  . diphenhydrAMINE (BENADRYL) 25 MG tablet Take 25 mg by mouth at bedtime as needed. For sleep    . diphenhydramine-acetaminophen (TYLENOL PM) 25-500 MG TABS Take 1 tablet by mouth at bedtime as needed. For insomnia/headache    . ezetimibe (ZETIA) 10 MG tablet TAKE ONE TABLET BY MOUTH ONCE DAILY 30 tablet 11  . Fiber CHEW Chew 1 each by mouth daily.     Marland Kitchen FLAXSEED, LINSEED, PO Take  1 capsule by mouth daily.     Marland Kitchen losartan (COZAAR) 25 MG tablet TAKE ONE TABLET BY MOUTH ONCE DAILY 30 tablet 11  . metoprolol succinate (TOPROL-XL) 25 MG 24 hr tablet TAKE THREE TABLETS BY MOUTH ONCE DAILY 90 tablet 3  . nitroGLYCERIN (NITROSTAT) 0.4 MG SL tablet Place 1 tablet (0.4 mg total) under the tongue every 5 (five) minutes as needed. For chest pain 25 tablet 5  . Omega-3 Fatty Acids (EQL OMEGA 3 FISH OIL) 1400 MG CAPS Take 1 tablet by mouth daily.    . polyethylene glycol (MIRALAX / GLYCOLAX) packet Take 17 g by mouth daily.     . vitamin E (VITAMIN E) 400 UNIT capsule Take 400 Units by mouth daily.    . Calcium Carbonate (CALTRATE 600 PO) Take 1 tablet by mouth once a week.     No current facility-administered medications for this visit.     Allergies:   Atorvastatin;  Bactrim [sulfamethoxazole-trimethoprim]; Buprenorphine hcl; Morphine and related; Prednisone; Sulfa antibiotics; and Sulfonamide derivatives    Social History:  The patient  reports that she has never smoked. She has never used smokeless tobacco. She reports that she drinks alcohol. She reports that she does not use drugs.   Family History:  The patient's family history includes Heart attack in her father; Hypertension in her brother and mother; Stroke in her maternal grandmother.    ROS:  Please see the history of present illness.    Review of Systems: Constitutional:  denies fever, chills, diaphoresis, appetite change and fatigue.  HEENT: denies photophobia, eye pain, redness, hearing loss, ear pain, congestion, sore throat, rhinorrhea, sneezing, neck pain, neck stiffness and tinnitus.  Respiratory: denies SOB, DOE, cough, chest tightness, and wheezing.  Cardiovascular: denies chest pain, palpitations and leg swelling.  Gastrointestinal: denies nausea, vomiting, abdominal pain, diarrhea, constipation, blood in stool.  Genitourinary: denies dysuria, urgency, frequency, hematuria, flank pain and difficulty urinating.  Musculoskeletal: denies  myalgias, back pain, joint swelling, arthralgias and gait problem.   Skin: denies pallor, rash and wound.  Neurological: denies dizziness, seizures, syncope, weakness, light-headedness, numbness and headaches.   Hematological: denies adenopathy, easy bruising, personal or family bleeding history.  Psychiatric/ Behavioral: denies suicidal ideation, mood changes, confusion, nervousness, sleep disturbance and agitation.       All other systems are reviewed and negative.    PHYSICAL EXAM: VS:  BP (!) 110/54 (BP Location: Right Arm, Patient Position: Sitting, Cuff Size: Normal)   Pulse 70   Ht 5\' 5"  (1.651 m)   Wt 130 lb 6.4 oz (59.1 kg)   BMI 21.70 kg/m  , BMI Body mass index is 21.7 kg/m. GEN: Well nourished, well developed, in no acute  distress  HEENT: normal  Neck: no JVD, carotid bruits, or masses Cardiac: RR; no murmurs, rubs, or gallops,no edema  Respiratory:  clear to auscultation bilaterally, normal work of breathing GI: soft, nontender, nondistended, + BS MS: no deformity or atrophy  Skin: warm and dry, no rash Neuro:  Strength and sensation are intact Psych: normal   EKG:  EKG is ordered today. Normal sinus rhythm at 60. Normal EKG.   Recent Labs: 06/23/2016: ALT 17; BUN 16; Creat 0.94; Hemoglobin 13.1; Platelets 208; Potassium 4.8; Sodium 140; TSH 2.51    Lipid Panel    Component Value Date/Time   CHOL 107 (L) 06/23/2016 0814   TRIG 70 06/23/2016 0814   HDL 54 06/23/2016 0814   CHOLHDL 2.0 06/23/2016 0814   VLDL 14 06/23/2016  [place] 06/23/2016 0814      Wt Readings from Last 3 Encounters:  07/06/16 130 lb 6.4 oz (59.1 kg)  06/29/16 129 lb (58.5 kg)  05/05/16 128 lb (58.1 kg)      Other studies Reviewed: Additional studies/ records that were reviewed today include: . Review of the above records demonstrates:    ASSESSMENT AND PLAN:  1. Coronary artery disease-status post coronary artery bypass graft- 2008 Status post PCI in 2010. , She is doing well. She's not having any episodes of angina.  2. craniotomy for subdural hematoma.  3.   hyperlipidemia-  She is now on crestor and zetia.  Her LDL levels were not completely controlled on statin alone. Her LDL levels are now at the right level after the addition of the Zetia. I would  continue with the Zetia.   The following changes have been made:  no change  Labs/ tests ordered today include:  No orders of the defined types were placed in this encounter.   Disposition:   FU with me in 6 months with fasting labs.    Signed, Mertie Moores, MD  07/06/2016 9:57 AM    [facility] [address], [place], [place]  [address] Phone: [phone]; Fax: [phone]

## 2016-07-30 ENCOUNTER — Ambulatory Visit (INDEPENDENT_AMBULATORY_CARE_PROVIDER_SITE_OTHER): Payer: Medicare Other | Admitting: *Deleted

## 2016-07-30 DIAGNOSIS — Z23 Encounter for immunization: Secondary | ICD-10-CM | POA: Diagnosis not present

## 2016-07-30 NOTE — Progress Notes (Signed)
Patient seen in office for Influenza Vaccination.   Tolerated IM administration well.   Immunization history updated.  

## 2016-09-09 ENCOUNTER — Other Ambulatory Visit: Payer: Self-pay | Admitting: Family Medicine

## 2016-12-28 ENCOUNTER — Other Ambulatory Visit: Payer: Medicare Other | Admitting: *Deleted

## 2016-12-28 DIAGNOSIS — E785 Hyperlipidemia, unspecified: Secondary | ICD-10-CM

## 2016-12-28 DIAGNOSIS — I2510 Atherosclerotic heart disease of native coronary artery without angina pectoris: Secondary | ICD-10-CM

## 2016-12-28 LAB — LIPID PANEL
CHOLESTEROL TOTAL: 126 mg/dL (ref 100–199)
Chol/HDL Ratio: 2.2 ratio units (ref 0.0–4.4)
HDL: 58 mg/dL (ref >39)
LDL Calculated: 50 mg/dL (ref 0–99)
Triglycerides: 89 mg/dL (ref 0–149)
VLDL CHOLESTEROL CAL: 18 mg/dL (ref 5–40)

## 2016-12-28 LAB — COMPREHENSIVE METABOLIC PANEL
ALBUMIN: 4.5 g/dL (ref 3.5–4.8)
ALK PHOS: 60 IU/L (ref 39–117)
ALT: 16 IU/L (ref 0–32)
AST: 20 IU/L (ref 0–40)
Albumin/Globulin Ratio: 2 (ref 1.2–2.2)
BUN / CREAT RATIO: 14 (ref 12–28)
BUN: 13 mg/dL (ref 8–27)
Bilirubin Total: 0.6 mg/dL (ref 0.0–1.2)
CO2: 24 mmol/L (ref 18–29)
CREATININE: 0.94 mg/dL (ref 0.57–1.00)
Calcium: 9.4 mg/dL (ref 8.7–10.3)
Chloride: 97 mmol/L (ref 96–106)
GFR calc Af Amer: 71 mL/min/{1.73_m2} (ref >59)
GFR calc non Af Amer: 62 mL/min/{1.73_m2} (ref >59)
GLUCOSE: 89 mg/dL (ref 65–99)
Globulin, Total: 2.3 g/dL (ref 1.5–4.5)
Potassium: 5.2 mmol/L (ref 3.5–5.2)
Sodium: 137 mmol/L (ref 134–144)
TOTAL PROTEIN: 6.8 g/dL (ref 6.0–8.5)

## 2016-12-28 NOTE — Addendum Note (Signed)
Addended by: Eulis Foster on: 12/28/2016 08:41 AM   Modules accepted: Orders

## 2016-12-29 ENCOUNTER — Encounter: Payer: Self-pay | Admitting: Cardiovascular Disease

## 2017-01-04 ENCOUNTER — Encounter: Payer: Self-pay | Admitting: Cardiovascular Disease

## 2017-01-04 ENCOUNTER — Ambulatory Visit (INDEPENDENT_AMBULATORY_CARE_PROVIDER_SITE_OTHER): Payer: Medicare Other | Admitting: Cardiovascular Disease

## 2017-01-04 VITALS — BP 130/80 | HR 58 | Ht 65.0 in | Wt 130.1 lb

## 2017-01-04 DIAGNOSIS — I10 Essential (primary) hypertension: Secondary | ICD-10-CM | POA: Diagnosis not present

## 2017-01-04 DIAGNOSIS — R5383 Other fatigue: Secondary | ICD-10-CM | POA: Diagnosis not present

## 2017-01-04 DIAGNOSIS — I2510 Atherosclerotic heart disease of native coronary artery without angina pectoris: Secondary | ICD-10-CM

## 2017-01-04 NOTE — Patient Instructions (Signed)
Medication Instructions:  Hold Crestor (Rosuvastatin) for 1-2 months to see if symptoms resolve   Labwork: TODAY - CBC, TSH   Testing/Procedures: None Ordered   Follow-Up: Your physician wants you to follow-up in: 6 months with Dr. Acie Fredrickson.  You will receive a reminder letter in the mail two months in advance. If you don't receive a letter, please call our office to schedule the follow-up appointment.   If you need a refill on your cardiac medications before your next appointment, please call your pharmacy.   Thank you for choosing [facility]! Christen Bame, RN [phone]

## 2017-01-04 NOTE — Progress Notes (Signed)
Cardiology Office Note   Date:  01/04/2017   ID:  Nancy, [name] 05-05-1946, MRN [id number]  PCP:  Odette Fraction, MD  Cardiologist:   Mertie Moores, MD   Chief Complaint  Patient presents with  . Follow-up    hyperlipidemia   1. Coronary artery disease-status post coronary artery bypass graft- 2008 Status post PCI in 2010. 2. craniotomy for subdural hematoma. 33.  hyperlipidemia-    Nancy [name] is doing well from a cardiac standpoint.  She is having lots of leg cramps. Takes Tylenol PM each night for cramps. She's tried taking mustard and this does not help. Her atorvastatin dose has been cut in 1/2 but she continues to have joint pain, muscle ache. She also has numbness in her feet,  Has lots of ankle, knee and hip pain    January 03, 2015:   Nancy [name] is a 71 y.o. female who presents for follow-up of her coronary artery disease.  Sept. 13, 2016:  Doing well.  Normal stress myoview in March , 2016, Lipids looked great  Feeling well.  Not sleeping well.   January 07, 2106:  Has had some palpitations when she lies on her left side to sleep  Has worn the monitor in the past Does not want to necessarily wear it again  Has bilateral hand splints on - arthritis   No CP or dyspnea. Has slowed down with her walking  - walking slower and perhaps not as far.   Sept. 18, 2017  Has occasional irreg HR when she lays down on her left side.   Able to do her normal activities without cardiac problems . No CP or dyspnea . Lots of arthritis symptoms . Has some memory loss following her subdural hematoma  January 04, 2017:  Multiple concerns / complaints today   Has lots of muscle pains  , joint pain , Arthritis  Brief Dizziness when she stands up  Constipation Fatigues easily    No angina , Very rare episodes of chest tightness.  Has stopped some of her OTC meds ( fish oil, Vit B6, )    Past Medical History:  Diagnosis Date  . Anxiety  state, unspecified   . CHEST PAIN UNSPECIFIED   . COLONIC POLYPS, HX OF   . CORONARY ATHEROSCLEROSIS NATIVE CORONARY ARTERY   . DYSLIPIDEMIA   . Essential hypertension, benign     Past Surgical History:  Procedure Laterality Date  . CORONARY ANGIOPLASTY WITH STENT PLACEMENT  2010   3 times  . CORONARY ARTERY BYPASS GRAFT  2009  . CRANIOTOMY  04/27/2012   Procedure: CRANIOTOMY HEMATOMA EVACUATION SUBDURAL;  Surgeon: Eustace Moore, MD;  Location: [facility];  Service: Neurosurgery;  Laterality: Right;  Right Frontal Burr hole for Subdural Hematoma  . NASAL SINUS SURGERY    . POLYPECTOMY       Current Outpatient Prescriptions  Medication Sig Dispense Refill  . aspirin 81 MG tablet Take 81 mg by mouth daily.     . Cholecalciferol (VITAMIN D-3 PO) Take 1 tablet by mouth every other day.     . Coenzyme Q10 (CO Q 10 PO) Take 1 capsule by mouth every other day.    . CRESTOR 10 MG tablet TAKE ONE TABLET BY MOUTH ONCE DAILY 90 tablet 3  . diphenhydrAMINE (BENADRYL) 25 MG tablet Take 25 mg by mouth at bedtime as needed. For sleep    . diphenhydramine-acetaminophen (TYLENOL PM) 25-500 MG TABS Take 1 tablet by  mouth at bedtime as needed. For insomnia/headache    . ezetimibe (ZETIA) 10 MG tablet TAKE ONE TABLET BY MOUTH ONCE DAILY 30 tablet 11  . Fiber CHEW Chew 1 each by mouth daily.     Marland Kitchen FLAXSEED, LINSEED, PO Take 1 capsule by mouth daily.     Marland Kitchen losartan (COZAAR) 25 MG tablet TAKE ONE TABLET BY MOUTH ONCE DAILY 30 tablet 11  . metoprolol succinate (TOPROL-XL) 25 MG 24 hr tablet TAKE THREE TABLETS BY MOUTH ONCE DAILY 90 tablet 3  . nitroGLYCERIN (NITROSTAT) 0.4 MG SL tablet Place 1 tablet (0.4 mg total) under the tongue every 5 (five) minutes as needed. For chest pain 25 tablet 5  . Omega-3 Fatty Acids (EQL OMEGA 3 FISH OIL) 1400 MG CAPS Take 1 tablet by mouth daily.    . polyethylene glycol (MIRALAX / GLYCOLAX) packet Take 17 g by mouth daily.     . vitamin E (VITAMIN E) 400 UNIT capsule  Take 400 Units by mouth daily.     No current facility-administered medications for this visit.     Allergies:   Alendronate; Atorvastatin; Bactrim [sulfamethoxazole-trimethoprim]; Buprenorphine hcl; Morphine and related; Prednisone; Sulfa antibiotics; and Sulfonamide derivatives    Social History:  The patient  reports that she has never smoked. She has never used smokeless tobacco. She reports that she drinks alcohol. She reports that she does not use drugs.   Family History:  The patient's family history includes Heart attack in her father; Hypertension in her brother and mother; Stroke in her maternal grandmother.    ROS:  Please see the history of present illness.    Review of Systems: Constitutional:  denies fever, chills, diaphoresis, appetite change and fatigue.  HEENT: denies photophobia, eye pain, redness, hearing loss, ear pain, congestion, sore throat, rhinorrhea, sneezing, neck pain, neck stiffness and tinnitus.  Respiratory: denies SOB, DOE, cough, chest tightness, and wheezing.  Cardiovascular: denies chest pain, palpitations and leg swelling.  Gastrointestinal: denies nausea, vomiting, abdominal pain, diarrhea, constipation, blood in stool.  Genitourinary: denies dysuria, urgency, frequency, hematuria, flank pain and difficulty urinating.  Musculoskeletal: denies  myalgias, back pain, joint swelling, arthralgias and gait problem.   Skin: denies pallor, rash and wound.  Neurological: denies dizziness, seizures, syncope, weakness, light-headedness, numbness and headaches.   Hematological: denies adenopathy, easy bruising, personal or family bleeding history.  Psychiatric/ Behavioral: denies suicidal ideation, mood changes, confusion, nervousness, sleep disturbance and agitation.       All other systems are reviewed and negative.    PHYSICAL EXAM: VS:  BP 130/80 (BP Location: Left Arm, Patient Position: Sitting, Cuff Size: Normal)   Pulse (!) 58   Ht 5\' 5"  (1.651 m)    Wt 130 lb 1.9 oz (59 kg)   SpO2 98%   BMI 21.65 kg/m  , BMI Body mass index is 21.65 kg/m. GEN: Well nourished, well developed, in no acute distress  HEENT: normal  Neck: no JVD, carotid bruits, or masses Cardiac: RR; no murmurs, rubs, or gallops,no edema  Respiratory:  clear to auscultation bilaterally, normal work of breathing GI: soft, nontender, nondistended, + BS MS: no deformity or atrophy  Skin: warm and dry, no rash Neuro:  Strength and sensation are intact Psych: normal   EKG:  EKG is ordered today. Normal sinus rhythm at 60. Normal EKG.   Recent Labs: 06/23/2016: Hemoglobin 13.1; Platelets 208; TSH 2.51 12/28/2016: ALT 16; BUN 13; Creatinine, Ser 0.94; Potassium 5.2; Sodium 137    Lipid Panel  Component Value Date/Time   CHOL 126 12/28/2016 0841   TRIG 89 12/28/2016 0841   HDL 58 12/28/2016 0841   CHOLHDL 2.2 12/28/2016 0841   CHOLHDL 2.0 06/23/2016 0814   VLDL 14 06/23/2016 0814   LDLCALC 50 12/28/2016 0841      Wt Readings from Last 3 Encounters:  01/04/17 130 lb 1.9 oz (59 kg)  07/06/16 130 lb 6.4 oz (59.1 kg)  06/29/16 129 lb (58.5 kg)      Other studies Reviewed: Additional studies/ records that were reviewed today include: . Review of the above records demonstrates:    ASSESSMENT AND PLAN:  1. Coronary artery disease-status post coronary artery bypass graft- 2008 Status post PCI in 2010. , She is doing well. She's not having any episodes of angina.  2. craniotomy for subdural hematoma.  3.   hyperlipidemia-  She is now on crestor and zetia.  Her LDL levels were not completely controlled on statin alone. Her LDL levels are now at the right level after the addition of the Zetia. I would  continue with the Zetia.  She's she's having lots of muscle and joint pains. It's possible that this is due to the Crestor. She will hold her Crestor for 1-2 months and continue Zetia. She will call us back around that time and let us know if her  symptoms have improved.  The following changes have been made:  no change  Labs/ tests ordered today include:  No orders of the defined types were placed in this encounter.   Disposition:   FU with me in 6 months with fasting labs.    Signed, Mertie Moores, MD  01/04/2017 11:46 AM    [facility] [address], [place], [place]  [address] Phone: [phone]; Fax: [phone]

## 2017-01-05 LAB — CBC WITH DIFFERENTIAL/PLATELET
BASOS ABS: 0.1 10*3/uL (ref 0.0–0.2)
BASOS: 1 %
EOS (ABSOLUTE): 0.1 10*3/uL (ref 0.0–0.4)
EOS: 2 %
HEMOGLOBIN: 13.4 g/dL (ref 11.1–15.9)
Hematocrit: 41.1 % (ref 34.0–46.6)
IMMATURE GRANS (ABS): 0 10*3/uL (ref 0.0–0.1)
Immature Granulocytes: 0 %
LYMPHS: 26 %
Lymphocytes Absolute: 1.8 10*3/uL (ref 0.7–3.1)
MCH: 30.9 pg (ref 26.6–33.0)
MCHC: 32.6 g/dL (ref 31.5–35.7)
MCV: 95 fL (ref 79–97)
MONOCYTES: 10 %
Monocytes Absolute: 0.7 10*3/uL (ref 0.1–0.9)
NEUTROS ABS: 4.2 10*3/uL (ref 1.4–7.0)
Neutrophils: 61 %
Platelets: 269 10*3/uL (ref 150–379)
RBC: 4.33 x10E6/uL (ref 3.77–5.28)
RDW: 13.4 % (ref 12.3–15.4)
WBC: 6.8 10*3/uL (ref 3.4–10.8)

## 2017-01-05 LAB — TSH: TSH: 1.56 u[IU]/mL (ref 0.450–4.500)

## 2017-01-15 ENCOUNTER — Encounter: Payer: Self-pay | Admitting: Podiatry

## 2017-01-15 ENCOUNTER — Ambulatory Visit (INDEPENDENT_AMBULATORY_CARE_PROVIDER_SITE_OTHER): Payer: Medicare Other | Admitting: Podiatry

## 2017-01-15 ENCOUNTER — Ambulatory Visit (INDEPENDENT_AMBULATORY_CARE_PROVIDER_SITE_OTHER): Payer: Medicare Other

## 2017-01-15 VITALS — BP 134/64 | HR 66 | Resp 16 | Ht 64.0 in | Wt 130.0 lb

## 2017-01-15 DIAGNOSIS — M779 Enthesopathy, unspecified: Secondary | ICD-10-CM | POA: Diagnosis not present

## 2017-01-15 DIAGNOSIS — L600 Ingrowing nail: Secondary | ICD-10-CM

## 2017-01-15 DIAGNOSIS — I2510 Atherosclerotic heart disease of native coronary artery without angina pectoris: Secondary | ICD-10-CM | POA: Diagnosis not present

## 2017-01-15 DIAGNOSIS — M79672 Pain in left foot: Principal | ICD-10-CM

## 2017-01-15 DIAGNOSIS — M79671 Pain in right foot: Secondary | ICD-10-CM

## 2017-01-15 NOTE — Patient Instructions (Signed)

## 2017-01-15 NOTE — Progress Notes (Signed)
Subjective:     Patient ID: Nancy [name], female   DOB: [birth year], 71 y.o.   MRN: [id number]  HPI patient presents with significant deformity of the hallux nails bilateral stating they're increasingly sore and she's tried to trim them and pad them without relief and she like something taken care of them give her relief   Review of Systems  All other systems reviewed and are negative.      Objective:   Physical Exam  Constitutional: She is oriented to person, place, and time.  Cardiovascular: Intact distal pulses.   Musculoskeletal: Normal range of motion.  Neurological: She is oriented to person, place, and time.  Skin: Skin is warm.  Nursing note and vitals reviewed.  neurovascular status intact muscle strength adequate range of motion within normal limits with patient found to have severely deformed damaged hallux nail bilateral that when pressed dorsally are very painful and also thick and fourth and fifth nails of the right foot. 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 I explained procedure and risk and patient wants surgery. Today I infiltrated the hallux bilateral 60 movement second working mixture remove the hallux nail exposed matrix and applied phenol 5 applications 30 seconds followed by alcohol lavage and sterile dressing. Instructed on soaks and we may need to remove these other nails at one point in future

## 2017-01-15 NOTE — Progress Notes (Signed)
   Subjective:    Patient ID: Nancy [name], female    DOB: Nov 26, 1945, 71 y.o.   MRN: [id number]  HPI Chief Complaint  Patient presents with  . Nail Problem    Bilateral; great toes; nail discoloration & thickened nails; x1.5 yrs  . Toe Pain    Bilateral; Toes 2 are going over Toes 3; has numbness in toes 1-4"; x8 months  . Debridement    Bilateral nail trim      Review of Systems  HENT: Positive for sore throat and tinnitus.   Eyes: Positive for itching.  Cardiovascular: Positive for palpitations.  Gastrointestinal: Positive for abdominal distention.  Musculoskeletal: Positive for arthralgias, back pain and myalgias.  Skin: Positive for color change.  All other systems reviewed and are negative.      Objective:   Physical Exam        Assessment & Plan:

## 2017-01-16 ENCOUNTER — Other Ambulatory Visit: Payer: Self-pay | Admitting: Family Medicine

## 2017-01-18 ENCOUNTER — Telehealth: Payer: Self-pay | Admitting: *Deleted

## 2017-01-18 NOTE — Telephone Encounter (Signed)
Pt states epsom salt I causing severe pain during soak. I instructed pt to stop the epsom salt and begin 1Tspoon betadine and 1 Qt warm water. Pt states understanding.

## 2017-02-05 ENCOUNTER — Encounter: Payer: Self-pay | Admitting: Family Medicine

## 2017-02-05 ENCOUNTER — Ambulatory Visit (INDEPENDENT_AMBULATORY_CARE_PROVIDER_SITE_OTHER): Payer: Medicare Other | Admitting: Family Medicine

## 2017-02-05 VITALS — BP 104/60 | HR 60 | Temp 97.9°F | Resp 14 | Ht 65.0 in | Wt 127.0 lb

## 2017-02-05 DIAGNOSIS — I2510 Atherosclerotic heart disease of native coronary artery without angina pectoris: Secondary | ICD-10-CM

## 2017-02-05 DIAGNOSIS — F458 Other somatoform disorders: Secondary | ICD-10-CM

## 2017-02-05 DIAGNOSIS — R1031 Right lower quadrant pain: Secondary | ICD-10-CM | POA: Diagnosis not present

## 2017-02-05 DIAGNOSIS — R0989 Other specified symptoms and signs involving the circulatory and respiratory systems: Secondary | ICD-10-CM

## 2017-02-05 NOTE — Progress Notes (Signed)
Subjective:    Patient ID: Nancy [name], female    DOB: 04/23/1946, 71 y.o.   MRN: [id number]  Medication Refill   Patient presents to the with 2 complaints first, she reports globus sensation. She states that whenever she swallows her pills, he feels like pain in her throat. Sometimes it so bad that she feels like she can't even swallow her own spit. However she denies any problems swallowing meat such as chicken or beef or [place] she has no problem with eating or drinking. She reports that it feels like her throat is swollen. However she denies any pain or hemoptysis or fever or chills or weight loss. There are no palpable masses in the throat. There is no goiter.    She also reports vague intermittent right lower quadrant abdominal pain. It comes and goes. There are no exacerbating or alleviating factors. She denies any fevers or chills or weight loss. She denies any melena or hematochezia. She denies any nausea or vomiting or dysuria. She denies any vaginal bleeding. She does report constipation. She is unable to go the bathroom unless she takes MiraLAX. It times it comes out extremely hard and painful. Past Medical History:  Diagnosis Date  . Anxiety state, unspecified   . CHEST PAIN UNSPECIFIED   . COLONIC POLYPS, HX OF   . CORONARY ATHEROSCLEROSIS NATIVE CORONARY ARTERY   . DYSLIPIDEMIA   . Essential hypertension, benign    Past Surgical History:  Procedure Laterality Date  . CORONARY ANGIOPLASTY WITH STENT PLACEMENT  2010   3 times  . CORONARY ARTERY BYPASS GRAFT  2009  . 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 NASAL SINUS SURGERY    . POLYPECTOMY     Current Outpatient Prescriptions on File Prior to Visit  Medication Sig Dispense Refill  . aspirin 81 MG tablet Take 81 mg by mouth daily.       . Cholecalciferol (VITAMIN D-3 PO) Take 1 tablet by mouth every other day.     . Coenzyme Q10 (CO Q 10 PO) Take 1 capsule by mouth every other day.    . CRESTOR 10 MG tablet TAKE ONE TABLET BY MOUTH ONCE DAILY (Patient taking differently: TAKE ONE TABLET BY MOUTH ONCE DAILY/ON HOLD 01/04/17) 90 tablet 3  . diphenhydrAMINE (BENADRYL) 25 MG tablet Take 25 mg by mouth at bedtime as needed. For sleep    . diphenhydramine-acetaminophen (TYLENOL PM) 25-500 MG TABS Take 1 tablet by mouth at bedtime as needed. For insomnia/headache    . ezetimibe (ZETIA) 10 MG tablet TAKE ONE TABLET BY MOUTH ONCE DAILY 30 tablet 11  . Fiber CHEW Chew 1 each by mouth daily.     Marland Kitchen FLAXSEED, LINSEED, PO Take 1 capsule by mouth daily.     Marland Kitchen losartan (COZAAR) 25 MG tablet TAKE ONE TABLET BY MOUTH ONCE DAILY 30 tablet 11  . metoprolol succinate (TOPROL-XL) 25 MG 24 hr tablet TAKE THREE TABLETS BY MOUTH ONCE DAILY 90 tablet 3  . nitroGLYCERIN (NITROSTAT) 0.4 MG SL tablet Place 1 tablet (0.4 mg total) under the tongue every 5 (five) minutes as needed. For chest pain 25 tablet 5  . Omega-3 Fatty Acids (EQL OMEGA 3 FISH OIL) 1400 MG CAPS Take 1 tablet by mouth daily.    . polyethylene glycol (MIRALAX / GLYCOLAX) packet  Take 17 g by mouth daily.     . vitamin E (VITAMIN E) 400 UNIT capsule Take 400 Units by mouth daily.     No current facility-administered medications on file prior to visit.    Allergies  Allergen Reactions  . Alendronate Shortness Of Breath and Swelling  . Atorvastatin Other (See Comments)    Muscle cramps  . Bactrim [Sulfamethoxazole-Trimethoprim] Other (See Comments)    Blisters/skin peeling, nausea  . Buprenorphine Hcl Nausea Only  . Morphine And Related Nausea Only  . Prednisone Other (See Comments)    Unknown  . Sulfa Antibiotics Itching    Blisters/skin peeling  . Sulfonamide Derivatives Other (See Comments)    Blisters/skin peeling   Social History   Social History  . Marital status:  Married    Spouse name: N/A  . Number of children: N/A  . Years of education: N/A   Occupational History  . Not on file.   Social History Main Topics  . Smoking status: Never Smoker  . Smokeless tobacco: Never Used  . Alcohol use Yes     Comment: occasional glass wine  . Drug use: No  . Sexual activity: Not on file   Other Topics Concern  . Not on file   Social History Narrative  . No narrative on file      Review of Systems  All other systems reviewed and are negative.      Objective:   Physical Exam  Constitutional: She appears well-developed and well-nourished. No distress.  HENT:  Mouth/Throat: Oropharynx is clear and moist. No oropharyngeal exudate.  Neck: Neck supple. No JVD present. No thyromegaly present.  Cardiovascular: Normal rate, regular rhythm and normal heart sounds.  Exam reveals no gallop and no friction rub.   No murmur heard. Pulmonary/Chest: Effort normal and breath sounds normal. No respiratory distress. She has no wheezes. She has no rales.  Abdominal: Soft. Bowel sounds are normal. She exhibits no distension and no mass. There is no tenderness. There is no rebound and no guarding.  Lymphadenopathy:    She has no cervical adenopathy.  Skin: She is not diaphoretic.  Vitals reviewed.         Assessment & Plan:  Globus sensation  Colicky RLQ abdominal pain Believe her globus sensation is likely secondary to acid reflux. I will her to try Nexium 40 mg a day and recheck in 2 weeks. Consider laryngoscopy if no better. I believe that the right lower quadrant abdominal pain is likely secondary to constipation. I will her to try Linzess 74 mg by mouth daily and recheck in 2 weeks.  Consider CT if no better.

## 2017-02-15 ENCOUNTER — Other Ambulatory Visit: Payer: Self-pay | Admitting: Cardiovascular Disease

## 2017-04-19 ENCOUNTER — Other Ambulatory Visit: Payer: Self-pay | Admitting: Cardiovascular Disease

## 2017-04-19 DIAGNOSIS — K219 Gastro-esophageal reflux disease without esophagitis: Secondary | ICD-10-CM | POA: Diagnosis not present

## 2017-04-19 DIAGNOSIS — R490 Dysphonia: Secondary | ICD-10-CM | POA: Insufficient documentation

## 2017-04-19 DIAGNOSIS — Z7289 Other problems related to lifestyle: Secondary | ICD-10-CM | POA: Diagnosis not present

## 2017-04-19 DIAGNOSIS — Z8709 Personal history of other diseases of the respiratory system: Secondary | ICD-10-CM | POA: Diagnosis not present

## 2017-04-19 DIAGNOSIS — J302 Other seasonal allergic rhinitis: Secondary | ICD-10-CM | POA: Diagnosis not present

## 2017-04-19 DIAGNOSIS — R09A2 Foreign body sensation, throat: Secondary | ICD-10-CM | POA: Insufficient documentation

## 2017-04-23 ENCOUNTER — Encounter (HOSPITAL_COMMUNITY): Payer: Self-pay | Admitting: Family Medicine

## 2017-04-23 ENCOUNTER — Ambulatory Visit (HOSPITAL_COMMUNITY)
Admission: EM | Admit: 2017-04-23 | Discharge: 2017-04-23 | Disposition: A | Discharge status: Home / Self Care | Payer: Medicare Other | Attending: Internal Medicine | Admitting: Internal Medicine

## 2017-04-23 DIAGNOSIS — S60012A Contusion of left thumb without damage to nail, initial encounter: Secondary | ICD-10-CM

## 2017-04-23 DIAGNOSIS — W231XXA Caught, crushed, jammed, or pinched between stationary objects, initial encounter: Secondary | ICD-10-CM | POA: Diagnosis not present

## 2017-04-23 DIAGNOSIS — S6010XA Contusion of unspecified finger with damage to nail, initial encounter: Secondary | ICD-10-CM

## 2017-04-23 NOTE — ED Triage Notes (Signed)
Pt here for left thumb pain and swelling after closing it in the car door on Tuesday.

## 2017-04-23 NOTE — ED Provider Notes (Signed)
CSN: [id number]     Arrival date & time 04/23/17  1425 History   None    Chief Complaint  Patient presents with  . Finger Injury   (Consider location/radiation/quality/duration/timing/severity/associated sxs/prior Treatment) 71 year old female presents to clinic for evaluation of an injury to her left thumb. States she caught her thumb in a car door 2 days ago, has had increasing pain, and swelling, in the thumb. She is able to move the thumb and has full grip strength. No fever or chills or other signs of infection.   The history is provided by the patient.    Past Medical History:  Diagnosis Date  . Anxiety state, unspecified   . CHEST PAIN UNSPECIFIED   . COLONIC POLYPS, HX OF   . CORONARY ATHEROSCLEROSIS NATIVE CORONARY ARTERY   . DYSLIPIDEMIA   . Essential hypertension, benign    Past Surgical History:  Procedure Laterality Date  . CORONARY ANGIOPLASTY WITH STENT PLACEMENT  2010   3 times  . CORONARY ARTERY BYPASS GRAFT  2009  . 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 NASAL SINUS SURGERY    . POLYPECTOMY     Family History  Problem Relation Age of Onset  . Hypertension Mother   . Heart attack Father   . Hypertension Brother   . Stroke Maternal Grandmother    Social History  Substance Use Topics  . Smoking status: Never Smoker  . Smokeless tobacco: Never Used  . Alcohol use Yes     Comment: occasional glass wine   OB History    No data available     Review of Systems  Constitutional: Negative.   HENT: Negative.   Respiratory: Negative.   Cardiovascular: Negative.   Musculoskeletal: Negative.   Skin:       Subungual hematoma  Neurological: Negative.     Allergies  Alendronate; Atorvastatin; Bactrim [sulfamethoxazole-trimethoprim]; Buprenorphine hcl; Morphine and related;  Prednisone; Sulfa antibiotics; and Sulfonamide derivatives  Home Medications   Prior to Admission medications   Medication Sig Start Date End Date Taking? Authorizing Provider  aspirin 81 MG tablet Take 81 mg by mouth daily.     [provider]  Cholecalciferol (VITAMIN D-3 PO) Take 1 tablet by mouth every other day.     [provider]  Coenzyme Q10 (CO Q 10 PO) Take 1 capsule by mouth every other day.    [provider]  CRESTOR 10 MG tablet TAKE ONE TABLET BY MOUTH ONCE DAILY 02/15/17   Nahser, Wonda Cheng, MD  diphenhydrAMINE (BENADRYL) 25 MG tablet Take 25 mg by mouth at bedtime as needed. For sleep    [provider]  diphenhydramine-acetaminophen (TYLENOL PM) 25-500 MG TABS Take 1 tablet by mouth at bedtime as needed. For insomnia/headache    [provider]  ezetimibe (ZETIA) 10 MG tablet TAKE ONE TABLET BY MOUTH ONCE DAILY 02/15/17   Nahser, Wonda Cheng, MD  Fiber CHEW Chew 1 each by mouth daily.     [provider]  FLAXSEED, LINSEED, PO Take 1 capsule by mouth daily.     [provider]  losartan (COZAAR) 25 MG tablet TAKE ONE TABLET BY MOUTH ONCE DAILY 02/15/17   Nahser, Wonda Cheng, MD  metoprolol succinate (TOPROL-XL) 25 MG 24 hr tablet TAKE THREE TABLETS BY MOUTH ONCE DAILY 01/18/17  Susy Frizzle, MD  nitroGLYCERIN (NITROSTAT) 0.4 MG SL tablet DISSOLVE ONE TABLET UNDER THE TONGUE EVERY 5 MINUTES AS NEEDED FOR CHEST PAIN.  DO NOT EXCEED A TOTAL OF 3 DOSES IN 15 MINUTES 02/15/17   Nahser, Wonda Cheng, MD  Omega-3 Fatty Acids (EQL OMEGA 3 FISH OIL) 1400 MG CAPS Take 1 tablet by mouth daily.    [provider]  polyethylene glycol (MIRALAX / GLYCOLAX) packet Take 17 g by mouth daily.     [provider]  vitamin E (VITAMIN E) 400 UNIT capsule Take 400 Units by mouth daily.    [provider]   Meds Ordered and Administered this Visit  Medications - No data to display  BP (!) 135/55   Pulse (!) 59    Temp 98 F (36.7 C)   Resp 18   SpO2 100%  No data found.   Physical Exam  Constitutional: She is oriented to person, place, and time. She appears well-developed and well-nourished. No distress.  HENT:  Head: Normocephalic and atraumatic.  Right Ear: External ear normal.  Left Ear: External ear normal.  Eyes: Conjunctivae are normal.  Neck: Normal range of motion.  Neurological: She is alert and oriented to person, place, and time.  Skin: Skin is warm and dry. Capillary refill takes less than 2 seconds. No rash noted. She is not diaphoretic. No erythema.  Subungual hematoma to the thumb of the left hand. Capillary refill less than 2 seconds, sensory function remains intact distally.  Psychiatric: She has a normal mood and affect. Her behavior is normal.  Nursing note and vitals reviewed.   Urgent Care Course     .Marland KitchenIncision and Drainage Date/Time: 04/23/2017 4:05 PM Performed by: Barnet Glasgow Authorized by: Sherlene Shams   Consent:    Consent obtained:  Verbal   Consent given by:  Patient   Risks discussed:  Bleeding, incomplete drainage, infection and pain Location:    Type:  Subungual hematoma   Size:  1 cm   Location:  Upper extremity   Upper extremity location:  Finger   Finger location:  L thumb Anesthesia (see MAR for exact dosages):    Anesthesia method:  None Procedure type:    Complexity:  Simple Procedure details:    Needle aspiration: no     Incision type: cautery pen.   Drainage:  Bloody   Drainage amount:  Copious   Wound treatment:  Wound left open   Packing materials:  None Post-procedure details:    Patient tolerance of procedure:  Tolerated well, no immediate complications   (including critical care time)  Labs Review Labs Reviewed - No data to display  Imaging Review No results found.     MDM   1. Subungual hematoma of digit of hand, initial encounter    Subungual hematoma was drained with cautery pen, no complications in the  procedure. Wound care guidelines provided to the patient, follow-up with primary care return to clinic as needed.    Barnet Glasgow, NP 04/23/17 [phone]

## 2017-04-23 NOTE — Discharge Instructions (Signed)
Your hematoma has been drained, you may still loose your finger nail. If you have any additional issues with your thumb, follow up with your primary care provider, or return to clinic as needed. Keep it clean, dry, change you bandaid at least daily. Monitor for signs or symptoms of infection.

## 2017-04-30 ENCOUNTER — Other Ambulatory Visit: Payer: Self-pay | Admitting: Family Medicine

## 2017-04-30 DIAGNOSIS — Z1231 Encounter for screening mammogram for malignant neoplasm of breast: Secondary | ICD-10-CM

## 2017-05-04 ENCOUNTER — Encounter: Payer: Self-pay | Admitting: Family Medicine

## 2017-05-04 ENCOUNTER — Ambulatory Visit (INDEPENDENT_AMBULATORY_CARE_PROVIDER_SITE_OTHER): Payer: Medicare Other | Admitting: Family Medicine

## 2017-05-04 VITALS — BP 140/60 | HR 62 | Temp 97.7°F | Resp 14 | Ht 65.0 in | Wt 129.0 lb

## 2017-05-04 DIAGNOSIS — M130 Polyarthritis, unspecified: Secondary | ICD-10-CM | POA: Diagnosis not present

## 2017-05-04 DIAGNOSIS — M791 Myalgia, unspecified site: Secondary | ICD-10-CM

## 2017-05-04 DIAGNOSIS — I2510 Atherosclerotic heart disease of native coronary artery without angina pectoris: Secondary | ICD-10-CM

## 2017-05-04 LAB — CBC WITH DIFFERENTIAL/PLATELET
BASOS ABS: 74 {cells}/uL (ref 0–200)
BASOS PCT: 1 %
EOS ABS: 148 {cells}/uL (ref 15–500)
Eosinophils Relative: 2 %
HEMATOCRIT: 39.2 % (ref 35.0–45.0)
Hemoglobin: 13.2 g/dL (ref 12.0–15.0)
LYMPHS PCT: 24 %
Lymphs Abs: 1776 cells/uL (ref 850–3900)
MCH: 31.5 pg (ref 27.0–33.0)
MCHC: 33.7 g/dL (ref 32.0–36.0)
MCV: 93.6 fL (ref 80.0–100.0)
MONO ABS: 666 {cells}/uL (ref 200–950)
MONOS PCT: 9 %
MPV: 10.6 fL (ref 7.5–12.5)
NEUTROS ABS: 4736 {cells}/uL (ref 1500–7800)
Neutrophils Relative %: 64 %
PLATELETS: 212 10*3/uL (ref 140–400)
RBC: 4.19 MIL/uL (ref 3.80–5.10)
RDW: 13.4 % (ref 11.0–15.0)
WBC: 7.4 10*3/uL (ref 3.8–10.8)

## 2017-05-04 LAB — COMPLETE METABOLIC PANEL WITH GFR
ALBUMIN: 4.4 g/dL (ref 3.6–5.1)
ALK PHOS: 52 U/L (ref 33–130)
ALT: 19 U/L (ref 6–29)
AST: 23 U/L (ref 10–35)
BILIRUBIN TOTAL: 0.8 mg/dL (ref 0.2–1.2)
BUN: 15 mg/dL (ref 7–25)
CALCIUM: 9.2 mg/dL (ref 8.6–10.4)
CO2: 21 mmol/L (ref 20–31)
CREATININE: 0.86 mg/dL (ref 0.60–0.93)
Chloride: 99 mmol/L (ref 98–110)
GFR, EST AFRICAN AMERICAN: 79 mL/min (ref >=60)
GFR, EST NON AFRICAN AMERICAN: 68 mL/min (ref >=60)
Glucose, Bld: 80 mg/dL (ref 70–99)
Potassium: 4.1 mmol/L (ref 3.5–5.3)
Sodium: 134 mmol/L — ABNORMAL LOW (ref 135–146)
TOTAL PROTEIN: 6.7 g/dL (ref 6.1–8.1)

## 2017-05-04 MED ORDER — GABAPENTIN 100 MG PO CAPS: 100.0000 mg | ORAL_CAPSULE | Freq: Three times a day (TID) | ORAL | 0 refills | Status: DC

## 2017-05-04 NOTE — Progress Notes (Signed)
Subjective:    Patient ID: Nancy [name], female    DOB: 19-May-1946, 71 y.o.   MRN: [id number]  HPI  Patient seems very upset today. She reports severe pain all over her body. She reports pain in both ankles, both knees, both hips, and both wrists. She also complains of pain in stiffness in her calf muscles as well as in her quadriceps and hamstrings. She denies any pain or stiffness in her shoulders or in her neck. She has a history of myalgias due to statins but this was in the past. His pain is out of proportion to what she is experienced previously. However her exam today is unremarkable. I examined all the above-mentioned joints. There is no crepitus and pain with range of motion or effusion in either ankle, knee, or hip. There is no palpable warmth or erythema in any of the involved joints. There are no visible deformities and she has good range of motion. Furthermore palpation of the muscle groups does not elicit any pain. However the patient states that she has near constant pain in her lower extremities. She denies any numbness or tingling in her feet or low back pain or other evidence of spinal stenosis. Past Medical History:  Diagnosis Date  . Anxiety state, unspecified   . CHEST PAIN UNSPECIFIED   . COLONIC POLYPS, HX OF   . CORONARY ATHEROSCLEROSIS NATIVE CORONARY ARTERY   . DYSLIPIDEMIA   . Essential hypertension, benign    Past Surgical History:  Procedure Laterality Date  . CORONARY ANGIOPLASTY WITH STENT PLACEMENT  2010   3 times  . CORONARY ARTERY BYPASS GRAFT  2009  . 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 NASAL SINUS SURGERY    . POLYPECTOMY     Current Outpatient Prescriptions on File Prior to Visit  Medication Sig Dispense Refill  . aspirin 81 MG tablet Take 81 mg by mouth daily.      . Cholecalciferol (VITAMIN D-3 PO) Take 1 tablet by mouth every other day.     . Coenzyme Q10 (CO Q 10 PO) Take 1 capsule by mouth every other day.    . CRESTOR 10 MG tablet TAKE ONE TABLET BY MOUTH ONCE DAILY 90 tablet 3  . diphenhydrAMINE (BENADRYL) 25 MG tablet Take 25 mg by mouth at bedtime as needed. For sleep    . diphenhydramine-acetaminophen (TYLENOL PM) 25-500 MG TABS Take 1 tablet by mouth at bedtime as needed. For insomnia/headache    . ezetimibe (ZETIA) 10 MG tablet TAKE ONE TABLET BY MOUTH ONCE DAILY 30 tablet 11  . Fiber CHEW Chew 1 each by mouth daily.     Marland Kitchen FLAXSEED, LINSEED, PO Take 1 capsule by mouth daily.     Marland Kitchen losartan (COZAAR) 25 MG tablet TAKE ONE TABLET BY MOUTH ONCE DAILY 30 tablet 11  . metoprolol succinate (TOPROL-XL) 25 MG 24 hr tablet TAKE THREE TABLETS BY MOUTH ONCE DAILY 90 tablet 3  . nitroGLYCERIN (NITROSTAT) 0.4 MG SL tablet DISSOLVE ONE TABLET UNDER THE TONGUE EVERY 5 MINUTES AS NEEDED FOR CHEST PAIN.  DO NOT EXCEED A TOTAL OF 3 DOSES IN 15 MINUTES 25 tablet 5  . Omega-3 Fatty Acids (EQL OMEGA 3 FISH OIL) 1400 MG CAPS Take 1 tablet by mouth daily.    . polyethylene glycol (MIRALAX / GLYCOLAX)  packet Take 17 g by mouth daily.     . vitamin E (VITAMIN E) 400 UNIT capsule Take 400 Units by mouth daily.     No current facility-administered medications on file prior to visit.    Allergies  Allergen Reactions  . Alendronate Shortness Of Breath and Swelling  . Atorvastatin Other (See Comments)    Muscle cramps  . Bactrim [Sulfamethoxazole-Trimethoprim] Other (See Comments)    Blisters/skin peeling, nausea  . Buprenorphine Hcl Nausea Only  . Morphine And Related Nausea Only  . Prednisone Other (See Comments)    Unknown  . Sulfa Antibiotics Itching    Blisters/skin peeling  . Sulfonamide Derivatives Other (See Comments)    Blisters/skin peeling   Social History   Social History  . Marital status: Married    Spouse name: N/A  . Number of children: N/A   . Years of education: N/A   Occupational History  . Not on file.   Social History Main Topics  . Smoking status: Never Smoker  . Smokeless tobacco: Never Used  . Alcohol use Yes     Comment: occasional glass wine  . Drug use: No  . Sexual activity: Not on file   Other Topics Concern  . Not on file   Social History Narrative  . No narrative on file     Review of Systems  All other systems reviewed and are negative.      Objective:   Physical Exam  Cardiovascular: Normal rate, regular rhythm and normal heart sounds.   Pulmonary/Chest: Effort normal and breath sounds normal.  Musculoskeletal:       Right hip: Normal. She exhibits normal range of motion, normal strength and no tenderness.       Left hip: Normal. She exhibits normal range of motion, normal strength and no tenderness.       Right knee: Normal. She exhibits normal range of motion, no swelling and no effusion. No medial joint line and no lateral joint line tenderness noted.       Left knee: Normal. She exhibits normal range of motion, no swelling and no effusion. No medial joint line and no lateral joint line tenderness noted.       Right ankle: Normal.       Left ankle: Normal.  Vitals reviewed.         Assessment & Plan:  Myalgia - Plan: CBC with Differential/Platelet, COMPLETE METABOLIC PANEL WITH GFR, Sedimentation rate, ANA, Rheumatoid factor, CK  Polyarthritis - Plan: CBC with Differential/Platelet, COMPLETE METABOLIC PANEL WITH GFR, Sedimentation rate, ANA, Rheumatoid factor, CK  I am perplexed by the amount of pain she is reporting despite her normal exam. I will have the patient discontinue Crestor and zetia.  Question however if the patient may have fibromyalgia. I will start the patient on gabapentin 100 mg by mouth 3 times a day for possible fibromyalgia versus neuropathic pain and reassess in 2 weeks to see if her symptoms are improving. I'll check a sedimentation rate to evaluate for possible  PMR. I will check an ANA, rheumatoid factor, and CK level to evaluate for autoimmune diseases. I will check a CBC to evaluate for any bone marrow abnormalities to suggest malignancy. Reassess in 2 weeks

## 2017-05-05 LAB — ANA: ANA: NEGATIVE

## 2017-05-05 LAB — SEDIMENTATION RATE: SED RATE: 4 mm/h (ref 0–30)

## 2017-05-05 LAB — CK: Total CK: 114 U/L (ref 29–143)

## 2017-05-05 LAB — RHEUMATOID FACTOR

## 2017-05-06 ENCOUNTER — Encounter: Payer: Self-pay | Admitting: Family Medicine

## 2017-05-17 ENCOUNTER — Other Ambulatory Visit: Payer: Self-pay | Admitting: Family Medicine

## 2017-05-18 ENCOUNTER — Ambulatory Visit: Payer: Medicare Other | Admitting: Family Medicine

## 2017-05-27 ENCOUNTER — Encounter: Payer: Self-pay | Admitting: Obstetrics & Gynecology

## 2017-05-27 ENCOUNTER — Ambulatory Visit (INDEPENDENT_AMBULATORY_CARE_PROVIDER_SITE_OTHER): Payer: Medicare Other | Admitting: Obstetrics & Gynecology

## 2017-05-27 VITALS — BP 120/72 | Ht 64.0 in | Wt 134.0 lb

## 2017-05-27 DIAGNOSIS — Z124 Encounter for screening for malignant neoplasm of cervix: Secondary | ICD-10-CM | POA: Diagnosis not present

## 2017-05-27 DIAGNOSIS — R102 Pelvic and perineal pain: Secondary | ICD-10-CM | POA: Diagnosis not present

## 2017-05-27 DIAGNOSIS — Z01411 Encounter for gynecological examination (general) (routine) with abnormal findings: Secondary | ICD-10-CM | POA: Diagnosis not present

## 2017-05-27 DIAGNOSIS — Z780 Asymptomatic menopausal state: Secondary | ICD-10-CM

## 2017-05-27 NOTE — Progress Notes (Signed)
Nancy [name] 1945/12/05 [id number]   History:    71 y.o. G2P2 Married  RP:  New patient presenting for annual gyn exam   HPI:  Menopause.  No HRT.  No PMB.  C/O intermittent RLQ shooting pain when changes position.  Vaginal/Vulvar dryness.  Not sexually active.  Breasts wnl.  Mictions/BMs wnl.  Past medical history,surgical history, family history and social history were all reviewed and documented in the EPIC chart.  Gynecologic History No LMP recorded. Patient is postmenopausal. Contraception: abstinence and post menopausal status Last Pap: 2 yrs ago. Results were: normal Last mammogram: 05/2016. Results were: normal  Obstetric History OB History  Gravida Para Term Preterm AB Living  2 2       2   SAB TAB Ectopic Multiple Live Births               # Outcome Date GA Lbr Len/2nd Weight Sex Delivery Anes PTL Lv  2 Para           1 Para                ROS: A ROS was performed and pertinent positives and negatives are included in the history.  GENERAL: No fevers or chills. HEENT: No change in vision, no earache, sore throat or sinus congestion. NECK: No pain or stiffness. CARDIOVASCULAR: No chest pain or pressure. No palpitations. PULMONARY: No shortness of breath, cough or wheeze. GASTROINTESTINAL: No abdominal pain, nausea, vomiting or diarrhea, melena or bright red blood per rectum. GENITOURINARY: No urinary frequency, urgency, hesitancy or dysuria. MUSCULOSKELETAL: No joint or muscle pain, no back pain, no recent trauma. DERMATOLOGIC: No rash, no itching, no lesions. ENDOCRINE: No polyuria, polydipsia, no heat or cold intolerance. No recent change in weight. HEMATOLOGICAL: No anemia or easy bruising or bleeding. NEUROLOGIC: No headache, seizures, numbness, tingling or weakness. PSYCHIATRIC: No depression, no loss of interest in normal activity or change in sleep pattern.     Exam:   BP 120/72   Ht 5\' 4"  (1.626 m)   Wt 134 lb (60.8 kg)   BMI 23.00 kg/m   Body mass  index is 23 kg/m.  General appearance : Well developed well nourished female. No acute distress HEENT: Eyes: no retinal hemorrhage or exudates,  Neck supple, trachea midline, no carotid bruits, no thyroidmegaly Lungs: Clear to auscultation, no rhonchi or wheezes, or rib retractions  Heart: Regular rate and rhythm, no murmurs or gallops Breast:Examined in sitting and supine position were symmetrical in appearance, no palpable masses or tenderness,  no skin retraction, no nipple inversion, no nipple discharge, no skin discoloration, no axillary or supraclavicular lymphadenopathy Abdomen: no palpable masses or tenderness, no rebound or guarding Extremities: no edema or skin discoloration or tenderness  Pelvic: Vulva: Normal with atrophy of menopause.  Bartholin, Urethra, Skene Glands: Within normal limits             Vagina: No gross lesions or discharge  Cervix: No gross lesions or discharge.  Pap reflex done.  Uterus  AV, normal size, shape and consistency, non-tender and mobile  Adnexa  Without masses or tenderness  Anus and perineum  normal     Assessment/Plan:  71 y.o. female for annual exam  1. Encounter for gynecological examination with abnormal finding Gyn exam with Atrophic Vaginitis.  Pap reflex done.  Breasts wnl.  Schedule screening Mammo.  2. Menopause present No HRT.  No PMB.  Menopausal Atrophic Vaginitis, but not sexually active.  Replens moisturizer recommended  for vaginal/vulvar dryness.  3. Pelvic pain in female Intermittent Rt pelvic pain with mobilization.  Pelvic exam neg.  Probably muscle/joint pain.  Recommendations discussed.  4. Screening for malignant neoplasm of cervix  - Pap IG w/ reflex to HPV when ASC-U  Counseling on above issues >50% x 20 minutes.  Princess Bruins MD, 11:41 AM 05/27/2017

## 2017-05-28 DIAGNOSIS — Z124 Encounter for screening for malignant neoplasm of cervix: Secondary | ICD-10-CM | POA: Diagnosis not present

## 2017-05-30 NOTE — Patient Instructions (Signed)
1. Encounter for gynecological examination with abnormal finding Gyn exam with Atrophic Vaginitis.  Pap reflex done.  Breasts wnl.  Schedule screening Mammo.  2. Menopause present No HRT.  No PMB.  Menopausal Atrophic Vaginitis, but not sexually active.  Replens moisturizer recommended for vaginal/vulvar dryness.  3. Pelvic pain in female Intermittent Rt pelvic pain with mobilization.  Pelvic exam neg.  Probably muscle/joint pain.  Recommendations discussed.  4. Screening for malignant neoplasm of cervix  - Pap IG w/ reflex to HPV when [facility]  Sullivan, it was a pleasure to meet you today!  I will inform you of your results as soon as available.

## 2017-06-01 ENCOUNTER — Ambulatory Visit (INDEPENDENT_AMBULATORY_CARE_PROVIDER_SITE_OTHER): Payer: Medicare Other | Admitting: Family Medicine

## 2017-06-01 ENCOUNTER — Encounter: Payer: Self-pay | Admitting: Family Medicine

## 2017-06-01 VITALS — BP 122/50 | HR 74 | Temp 97.8°F | Resp 14 | Ht 64.0 in | Wt 129.0 lb

## 2017-06-01 DIAGNOSIS — M797 Fibromyalgia: Secondary | ICD-10-CM | POA: Diagnosis not present

## 2017-06-01 DIAGNOSIS — I2510 Atherosclerotic heart disease of native coronary artery without angina pectoris: Secondary | ICD-10-CM | POA: Diagnosis not present

## 2017-06-01 LAB — PAP IG W/ RFLX HPV ASCU

## 2017-06-01 NOTE — Progress Notes (Signed)
Subjective:    Patient ID: Nancy [name], female    DOB: Feb 16, 1946, 71 y.o.   MRN: [id number]  HPI 05/04/17 Patient seems very upset today. She reports severe pain all over her body. She reports pain in both ankles, both knees, both hips, and both wrists. She also complains of pain in stiffness in her calf muscles as well as in her quadriceps and hamstrings. She denies any pain or stiffness in her shoulders or in her neck. She has a history of myalgias due to statins but this was in the past. His pain is out of proportion to what she is experienced previously. However her exam today is unremarkable. I examined all the above-mentioned joints. There is no crepitus and pain with range of motion or effusion in either ankle, knee, or hip. There is no palpable warmth or erythema in any of the involved joints. There are no visible deformities and she has good range of motion. Furthermore palpation of the muscle groups does not elicit any pain. However the patient states that she has near constant pain in her lower extremities. She denies any numbness or tingling in her feet or low back pain or other evidence of spinal stenosis.  At that time, my plan was: I am perplexed by the amount of pain she is reporting despite her normal exam. I will have the patient discontinue Crestor and zetia.  Question however if the patient may have fibromyalgia. I will start the patient on gabapentin 100 mg by mouth 3 times a day for possible fibromyalgia versus neuropathic pain and reassess in 2 weeks to see if her symptoms are improving. I'll check a sedimentation rate to evaluate for possible PMR. I will check an ANA, rheumatoid factor, and CK level to evaluate for autoimmune diseases. I will check a CBC to evaluate for any bone marrow abnormalities to suggest malignancy. Reassess in 2 weeks.  06/01/17 Office Visit on 05/04/2017  Component Date Value Ref Range Status  . WBC 05/04/2017 7.4  3.8 - 10.8 K/uL Final  . RBC  05/04/2017 4.19  3.80 - 5.10 MIL/uL Final  . Hemoglobin 05/04/2017 13.2  12.0 - 15.0 g/dL Final  . HCT 05/04/2017 39.2  35.0 - 45.0 % Final  . MCV 05/04/2017 93.6  80.0 - 100.0 fL Final  . MCH 05/04/2017 31.5  27.0 - 33.0 pg Final  . MCHC 05/04/2017 33.7  32.0 - 36.0 g/dL Final  . RDW 05/04/2017 13.4  11.0 - 15.0 % Final  . Platelets 05/04/2017 212  140 - 400 K/uL Final  . MPV 05/04/2017 10.6  7.5 - 12.5 fL Final  . Neutro Abs 05/04/2017 4736  1,500 - 7,800 cells/uL Final  . Lymphs Abs 05/04/2017 1776  850 - 3,900 cells/uL Final  . Monocytes Absolute 05/04/2017 666  200 - 950 cells/uL Final  . Eosinophils Absolute 05/04/2017 148  15 - 500 cells/uL Final  . Basophils Absolute 05/04/2017 74  0 - 200 cells/uL Final  . Neutrophils Relative % 05/04/2017 64  % Final  . Lymphocytes Relative 05/04/2017 24  % Final  . Monocytes Relative 05/04/2017 9  % Final  . Eosinophils Relative 05/04/2017 2  % Final  . Basophils Relative 05/04/2017 1  % Final  . Smear Review 05/04/2017 Criteria for review not met   Final  . Sodium 05/04/2017 134* 135 - 146 mmol/L Final  . Potassium 05/04/2017 4.1  3.5 - 5.3 mmol/L Final  . Chloride 05/04/2017 99  98 - 110 mmol/L  Final  . CO2 05/04/2017 21  20 - 31 mmol/L Final  . Glucose, Bld 05/04/2017 80  70 - 99 mg/dL Final  . BUN 05/04/2017 15  7 - 25 mg/dL Final  . Creat 05/04/2017 0.86  0.60 - 0.93 mg/dL Final   Comment:   For patients > or = 71 years of age: The upper reference limit for Creatinine is approximately 13% higher for people identified as African-American.     . Total Bilirubin 05/04/2017 0.8  0.2 - 1.2 mg/dL Final  . Alkaline Phosphatase 05/04/2017 52  33 - 130 U/L Final  . AST 05/04/2017 23  10 - 35 U/L Final  . ALT 05/04/2017 19  6 - 29 U/L Final  . Total Protein 05/04/2017 6.7  6.1 - 8.1 g/dL Final  . Albumin 05/04/2017 4.4  3.6 - 5.1 g/dL Final  . Calcium 05/04/2017 9.2  8.6 - 10.4 mg/dL Final  . GFR, Est African American 05/04/2017 79  >=60  mL/min Final  . GFR, Est Non African American 05/04/2017 68  >=60 mL/min Final  . Sed Rate 05/04/2017 4  0 - 30 mm/hr Final  . Anit Nuclear Antibody(ANA) 05/04/2017 NEG  NEGATIVE Final  . Rhuematoid fact SerPl-aCnc 05/04/2017 <14  <14 IU/mL Final  . Total CK 05/04/2017 114  29 - 143 U/L Final   Patient is here today for follow-up. As indicated above, all of her lab work was completely normal. Sedimentation rate was 4. ANA was negative. CK was within normal limits. Rheumatoid factor was normal. AST was normal suggesting no myositis. CBC was normal.  Patient stopped Crestor and  zetia as we planned. Pain did not improve substantially.  However after starting gabapentin, her pain is remarkably better and is almost gone. Today she is in a much brighter spirits. She is smiling. She denies any pain in her joints in her hips in her back or in her legs. Past Medical History:  Diagnosis Date  . Anxiety state, unspecified   . CHEST PAIN UNSPECIFIED   . COLONIC POLYPS, HX OF   . CORONARY ATHEROSCLEROSIS NATIVE CORONARY ARTERY   . DYSLIPIDEMIA   . Essential hypertension, benign    Past Surgical History:  Procedure Laterality Date  . CORONARY ANGIOPLASTY WITH STENT PLACEMENT  2010   3 times  . CORONARY ARTERY BYPASS GRAFT  2009  . CRANIOTOMY  04/27/2012   Procedure: CRANIOTOMY HEMATOMA EVACUATION SUBDURAL;  Surgeon: Eustace Moore, MD;  Location: [facility];  Service: Neurosurgery;  Laterality: Right;  Right Frontal Burr hole for Subdural Hematoma  . NASAL SINUS SURGERY    . POLYPECTOMY     Current Outpatient Prescriptions on File Prior to Visit  Medication Sig Dispense Refill  . aspirin 81 MG tablet Take 81 mg by mouth daily.     . Cholecalciferol (VITAMIN D-3 PO) Take 1 tablet by mouth every other day.     . Coenzyme Q10 (CO Q 10 PO) Take 1 capsule by mouth every other day.    . CRESTOR 10 MG tablet TAKE ONE TABLET BY MOUTH ONCE DAILY (Patient not taking: Reported on 05/27/2017) 90 tablet 3  .  diphenhydrAMINE (BENADRYL) 25 MG tablet Take 25 mg by mouth at bedtime as needed. For sleep    . diphenhydramine-acetaminophen (TYLENOL PM) 25-500 MG TABS Take 1 tablet by mouth at bedtime as needed. For insomnia/headache    . ezetimibe (ZETIA) 10 MG tablet TAKE ONE TABLET BY MOUTH ONCE DAILY (Patient not taking: Reported on  05/27/2017) 30 tablet 11  . Fiber CHEW Chew 1 each by mouth daily.     Marland Kitchen FLAXSEED, LINSEED, PO Take 1 capsule by mouth daily.     Marland Kitchen gabapentin (NEURONTIN) 100 MG capsule Take 1 capsule (100 mg total) by mouth 3 (three) times daily. 90 capsule 0  . losartan (COZAAR) 25 MG tablet TAKE ONE TABLET BY MOUTH ONCE DAILY 30 tablet 11  . metoprolol succinate (TOPROL-XL) 25 MG 24 hr tablet TAKE 3 TABLETS BY MOUTH ONCE DAILY 90 tablet 3  . nitroGLYCERIN (NITROSTAT) 0.4 MG SL tablet DISSOLVE ONE TABLET UNDER THE TONGUE EVERY 5 MINUTES AS NEEDED FOR CHEST PAIN.  DO NOT EXCEED A TOTAL OF 3 DOSES IN 15 MINUTES 25 tablet 5  . Omega-3 Fatty Acids (EQL OMEGA 3 FISH OIL) 1400 MG CAPS Take 1 tablet by mouth daily.    . polyethylene glycol (MIRALAX / GLYCOLAX) packet Take 17 g by mouth daily.     . vitamin E (VITAMIN E) 400 UNIT capsule Take 400 Units by mouth daily.     No current facility-administered medications on file prior to visit.    Allergies  Allergen Reactions  . Alendronate Shortness Of Breath and Swelling  . Atorvastatin Other (See Comments)    Muscle cramps  . Bactrim [Sulfamethoxazole-Trimethoprim] Other (See Comments)    Blisters/skin peeling, nausea  . Buprenorphine Hcl Nausea Only  . Morphine And Related Nausea Only  . Prednisone Other (See Comments)    Unknown  . Sulfa Antibiotics Itching    Blisters/skin peeling  . Sulfonamide Derivatives Other (See Comments)    Blisters/skin peeling   Social History   Social History  . Marital status: Married    Spouse name: N/A  . Number of children: N/A  . Years of education: N/A   Occupational History  . Not on file.    Social History Main Topics  . Smoking status: Never Smoker  . Smokeless tobacco: Never Used  . Alcohol use Yes     Comment: occasional glass wine  . Drug use: No  . Sexual activity: Not Currently    Partners: Male     Comment: 1ST INTERCOURSE- 58, PARTNERS- 1 , MARRIED - 74 YRS    Other Topics Concern  . Not on file   Social History Narrative  . No narrative on file     Review of Systems  All other systems reviewed and are negative.      Objective:   Physical Exam  Cardiovascular: Normal rate, regular rhythm and normal heart sounds.   Pulmonary/Chest: Effort normal and breath sounds normal.  Musculoskeletal:       Right hip: Normal. She exhibits normal range of motion, normal strength and no tenderness.       Left hip: Normal. She exhibits normal range of motion, normal strength and no tenderness.       Right knee: Normal. She exhibits normal range of motion, no swelling and no effusion. No medial joint line and no lateral joint line tenderness noted.       Left knee: Normal. She exhibits normal range of motion, no swelling and no effusion. No medial joint line and no lateral joint line tenderness noted.       Right ankle: Normal.       Left ankle: Normal.  Vitals reviewed.         Assessment & Plan:  Fibromyalgia- Patient seems to have a almost miraculous response to gabapentin even at a low dose. Or  I'm happy that she is better, I am also guarded and optimism. Given the fact the pain has essentially resolved, I do believe that some of this is related to discontinuing her cholesterol medication. I will leave the patient on gabapentin for fibromyalgia and I asked the patient to resume zetia and see if the pain returns.

## 2017-06-04 ENCOUNTER — Ambulatory Visit
Admission: RE | Admit: 2017-06-04 | Discharge: 2017-06-04 | Disposition: A | Discharge status: Home / Self Care | Payer: Medicare Other | Source: Ambulatory Visit | Attending: Family Medicine | Admitting: Family Medicine

## 2017-06-04 DIAGNOSIS — Z1231 Encounter for screening mammogram for malignant neoplasm of breast: Secondary | ICD-10-CM | POA: Diagnosis not present

## 2017-06-15 ENCOUNTER — Encounter: Payer: Self-pay | Admitting: *Deleted

## 2017-06-24 ENCOUNTER — Ambulatory Visit (INDEPENDENT_AMBULATORY_CARE_PROVIDER_SITE_OTHER): Payer: Medicare Other

## 2017-06-24 DIAGNOSIS — Z23 Encounter for immunization: Secondary | ICD-10-CM

## 2017-06-24 NOTE — Progress Notes (Signed)
Patient was seen in office for flu vaccine. Patient received flu vaccine in her left deltoid patient tolerated well

## 2017-06-30 DIAGNOSIS — H2513 Age-related nuclear cataract, bilateral: Secondary | ICD-10-CM | POA: Diagnosis not present

## 2017-06-30 DIAGNOSIS — H16213 Exposure keratoconjunctivitis, bilateral: Secondary | ICD-10-CM | POA: Diagnosis not present

## 2017-06-30 DIAGNOSIS — H16143 Punctate keratitis, bilateral: Secondary | ICD-10-CM | POA: Diagnosis not present

## 2017-06-30 DIAGNOSIS — H04123 Dry eye syndrome of bilateral lacrimal glands: Secondary | ICD-10-CM | POA: Diagnosis not present

## 2017-08-26 ENCOUNTER — Encounter: Payer: Self-pay | Admitting: Cardiovascular Disease

## 2017-08-26 ENCOUNTER — Ambulatory Visit (INDEPENDENT_AMBULATORY_CARE_PROVIDER_SITE_OTHER): Payer: Medicare Other | Admitting: Cardiovascular Disease

## 2017-08-26 VITALS — BP 128/74 | HR 63 | Ht 64.0 in | Wt 127.2 lb

## 2017-08-26 DIAGNOSIS — I25118 Atherosclerotic heart disease of native coronary artery with other forms of angina pectoris: Secondary | ICD-10-CM

## 2017-08-26 DIAGNOSIS — E782 Mixed hyperlipidemia: Secondary | ICD-10-CM

## 2017-08-26 DIAGNOSIS — I2510 Atherosclerotic heart disease of native coronary artery without angina pectoris: Secondary | ICD-10-CM

## 2017-08-26 DIAGNOSIS — I209 Angina pectoris, unspecified: Secondary | ICD-10-CM | POA: Diagnosis not present

## 2017-08-26 NOTE — Patient Instructions (Signed)
Medication Instructions:  Your physician recommends that you continue on your current medications as directed. Please refer to the Current Medication list given to you today.   Labwork: TODAY - cholesterol, liver panel, basic metabolic panel   Testing/Procedures: Your physician has requested that you have an exercise stress myoview. For further information please visit [email or web address]. Please follow instruction sheet, as given.    Follow-Up: Your physician wants you to follow-up in: 6 months with Dr. Acie Fredrickson.  You will receive a reminder letter in the mail two months in advance. If you don't receive a letter, please call our office to schedule the follow-up appointment.   If you need a refill on your cardiac medications before your next appointment, please call your pharmacy.   Thank you for choosing [facility]! Christen Bame, RN [phone]

## 2017-08-26 NOTE — Progress Notes (Signed)
Cardiology Office Note   Date:  08/26/2017   ID:  Nancy [name], Nancy [name] 20-May-1946, MRN [id number]  PCP:  Susy Frizzle, MD  Cardiologist:   Mertie Moores, MD   Chief Complaint  Patient presents with  . Coronary Artery Disease   1. Coronary artery disease-status post coronary artery bypass graft- 2008 Status post PCI in 2010. 2. craniotomy for subdural hematoma. 19.  hyperlipidemia-    Nancy [name] is doing well from a cardiac standpoint.  She is having lots of leg cramps. Takes Tylenol PM each night for cramps. She's tried taking mustard and this does not help. Her atorvastatin dose has been cut in 1/2 but she continues to have joint pain, muscle ache. She also has numbness in her feet,  Has lots of ankle, knee and hip pain    January 03, 2015:   Nancy [name] is a 71 y.o. female who presents for follow-up of her coronary artery disease.  Sept. 13, 2016:  Doing well.  Normal stress myoview in March , 2016, Lipids looked great  Feeling well.  Not sleeping well.   January 07, 2106:  Has had some palpitations when she lies on her left side to sleep  Has worn the monitor in the past Does not want to necessarily wear it again  Has bilateral hand splints on - arthritis   No CP or dyspnea. Has slowed down with her walking  - walking slower and perhaps not as far.   Sept. 18, 2017  Has occasional irreg HR when she lays down on her left side.   Able to do her normal activities without cardiac problems . No CP or dyspnea . Lots of arthritis symptoms . Has some memory loss following her subdural hematoma  January 04, 2017:  Multiple concerns / complaints today   Has lots of muscle pains  , joint pain , Arthritis  Brief Dizziness when she stands up  Constipation Fatigues easily    No angina , Very rare episodes of chest tightness.  Has stopped some of her OTC meds ( fish oil, Vit B6, )   August 26, 2018:  Nancy [name] is having fatigue  - more  fatigue and shortness of breath with exertion  Very similar to prior to her CABG  Has occasional palpitations    Past Medical History:  Diagnosis Date  . Anxiety state, unspecified   . CHEST PAIN UNSPECIFIED   . COLONIC POLYPS, HX OF   . CORONARY ATHEROSCLEROSIS NATIVE CORONARY ARTERY   . DYSLIPIDEMIA   . Essential hypertension, benign     Past Surgical History:  Procedure Laterality Date  . CORONARY ANGIOPLASTY WITH STENT PLACEMENT  2010   3 times  . CORONARY ARTERY BYPASS GRAFT  2009  . NASAL SINUS SURGERY    . POLYPECTOMY       Current Outpatient Medications  Medication Sig Dispense Refill  . aspirin 81 MG tablet Take 81 mg by mouth daily.     . Cholecalciferol (VITAMIN D-3 PO) Take 1 tablet by mouth every other day.     . Coenzyme Q10 (CO Q 10 PO) Take 1 capsule by mouth every other day.    . CRESTOR 10 MG tablet TAKE ONE TABLET BY MOUTH ONCE DAILY 90 tablet 3  . diphenhydrAMINE (BENADRYL) 25 MG tablet Take 25 mg by mouth at bedtime as needed. For sleep    . diphenhydramine-acetaminophen (TYLENOL PM) 25-500 MG TABS Take 1 tablet by mouth at bedtime as needed. For  insomnia/headache    . ezetimibe (ZETIA) 10 MG tablet TAKE ONE TABLET BY MOUTH ONCE DAILY 30 tablet 11  . Fiber CHEW Chew 1 each by mouth daily.     Marland Kitchen FLAXSEED, LINSEED, PO Take 1 capsule by mouth daily.     Marland Kitchen gabapentin (NEURONTIN) 100 MG capsule Take 1 capsule (100 mg total) by mouth 3 (three) times daily. 90 capsule 0  . losartan (COZAAR) 25 MG tablet TAKE ONE TABLET BY MOUTH ONCE DAILY 30 tablet 11  . metoprolol succinate (TOPROL-XL) 25 MG 24 hr tablet TAKE 3 TABLETS BY MOUTH ONCE DAILY 90 tablet 3  . nitroGLYCERIN (NITROSTAT) 0.4 MG SL tablet DISSOLVE ONE TABLET UNDER THE TONGUE EVERY 5 MINUTES AS NEEDED FOR CHEST PAIN.  DO NOT EXCEED A TOTAL OF 3 DOSES IN 15 MINUTES 25 tablet 5  . Omega-3 Fatty Acids (EQL OMEGA 3 FISH OIL) 1400 MG CAPS Take 1 tablet by mouth daily.    . polyethylene glycol (MIRALAX /  GLYCOLAX) packet Take 17 g by mouth daily.     . vitamin E (VITAMIN E) 400 UNIT capsule Take 400 Units by mouth daily.     No current facility-administered medications for this visit.     Allergies:   Alendronate; Atorvastatin; Bactrim [sulfamethoxazole-trimethoprim]; Buprenorphine hcl; Morphine; Morphine and related; Prednisone; Sulfa antibiotics; Sulfasalazine; and Sulfonamide derivatives    Social History:  The patient  reports that  has never smoked. she has never used smokeless tobacco. She reports that she drinks alcohol. She reports that she does not use drugs.   Family History:  The patient's family history includes Cancer in her maternal grandfather and mother; Heart attack in her father; Hypertension in her brother and mother; Kidney disease in her mother; Stroke in her maternal grandmother.    ROS:  Please see the history of present illness.  Reviewed and current history.  Otherwise negative.  Physical Exam: Blood pressure 128/74, pulse 63, height 5\' 4"  (1.626 m), weight 127 lb 3.2 oz (57.7 kg).  GEN:  Well nourished, well developed in no acute distress HEENT: Normal NECK: No JVD; No carotid bruits LYMPHATICS: No lymphadenopathy CARDIAC: RR, no murmurs, rubs, gallops RESPIRATORY:  Clear to auscultation without rales, wheezing or rhonchi  ABDOMEN: Soft, non-tender, non-distended MUSCULOSKELETAL:  No edema; No deformity  SKIN: Warm and dry NEUROLOGIC:  Alert and oriented x 3  EKG:  Nov. 8  2018:  NSR at 63.   Normal ECG   Recent Labs: 01/04/2017: TSH 1.560 05/04/2017: ALT 19; BUN 15; Creat 0.86; Hemoglobin 13.2; Platelets 212; Potassium 4.1; Sodium 134    Lipid Panel    Component Value Date/Time   CHOL 126 12/28/2016 0841   TRIG 89 12/28/2016 0841   HDL 58 12/28/2016 0841   CHOLHDL 2.2 12/28/2016 0841   CHOLHDL 2.0 06/23/2016 0814   VLDL 14 06/23/2016 0814   LDLCALC 50 12/28/2016 0841     Wt Readings from Last 3 Encounters:  08/26/17 127 lb 3.2 oz (57.7 kg)    06/01/17 129 lb (58.5 kg)  05/27/17 134 lb (60.8 kg)     Other studies Reviewed: Additional studies/ records that were reviewed today include: . Review of the above records demonstrates:    ASSESSMENT AND PLAN:  1. Coronary artery disease-status post coronary artery bypass graft- 2008 Status post PCI in 2010.   Nancy [name] is having more fatigue and shortness of breath when she exercises.  This is very similar to her previous episodes of angina. We will schedule  her for [employer] study.  2. craniotomy for subdural hematoma.  3.   hyperlipidemia-   Will get fasting lipids, BMP,liver enz  today   The following changes have been made:  no change  Labs/ tests ordered today include:  No orders of the defined types were placed in this encounter.   Disposition:   FU with me in 6 months with fasting labs.    Signed, Mertie Moores, MD  08/26/2017 4:02 PM    [facility] [address], [place], [place]  [address] Phone: [phone]; Fax: [phone]

## 2017-08-27 LAB — HEPATIC FUNCTION PANEL
ALBUMIN: 4.7 g/dL (ref 3.5–4.8)
ALK PHOS: 57 IU/L (ref 39–117)
ALT: 17 IU/L (ref 0–32)
AST: 23 IU/L (ref 0–40)
BILIRUBIN TOTAL: 0.6 mg/dL (ref 0.0–1.2)
Bilirubin, Direct: 0.18 mg/dL (ref 0.00–0.40)
TOTAL PROTEIN: 7 g/dL (ref 6.0–8.5)

## 2017-08-27 LAB — BASIC METABOLIC PANEL
BUN/Creatinine Ratio: 11 — ABNORMAL LOW (ref 12–28)
BUN: 11 mg/dL (ref 8–27)
CALCIUM: 9.5 mg/dL (ref 8.7–10.3)
CO2: 25 mmol/L (ref 20–29)
CREATININE: 0.96 mg/dL (ref 0.57–1.00)
Chloride: 101 mmol/L (ref 96–106)
GFR calc Af Amer: 69 mL/min/{1.73_m2} (ref >59)
GFR, EST NON AFRICAN AMERICAN: 60 mL/min/{1.73_m2} (ref >59)
GLUCOSE: 83 mg/dL (ref 65–99)
POTASSIUM: 4.4 mmol/L (ref 3.5–5.2)
Sodium: 141 mmol/L (ref 134–144)

## 2017-08-27 LAB — LIPID PANEL
CHOLESTEROL TOTAL: 120 mg/dL (ref 100–199)
Chol/HDL Ratio: 2.4 ratio (ref 0.0–4.4)
HDL: 51 mg/dL (ref >39)
LDL Calculated: 51 mg/dL (ref 0–99)
Triglycerides: 91 mg/dL (ref 0–149)
VLDL Cholesterol Cal: 18 mg/dL (ref 5–40)

## 2017-08-28 ENCOUNTER — Emergency Department (HOSPITAL_COMMUNITY)
Admission: EM | Admit: 2017-08-28 | Discharge: 2017-08-28 | Disposition: A | Discharge status: Home / Self Care | Payer: Medicare Other | Attending: Emergency Medicine | Admitting: Emergency Medicine

## 2017-08-28 ENCOUNTER — Other Ambulatory Visit: Payer: Self-pay

## 2017-08-28 ENCOUNTER — Encounter (HOSPITAL_COMMUNITY): Payer: Self-pay | Admitting: *Deleted

## 2017-08-28 DIAGNOSIS — S0185XA Open bite of other part of head, initial encounter: Secondary | ICD-10-CM | POA: Diagnosis not present

## 2017-08-28 DIAGNOSIS — Z955 Presence of coronary angioplasty implant and graft: Secondary | ICD-10-CM | POA: Insufficient documentation

## 2017-08-28 DIAGNOSIS — Y999 Unspecified external cause status: Secondary | ICD-10-CM | POA: Insufficient documentation

## 2017-08-28 DIAGNOSIS — S0993XA Unspecified injury of face, initial encounter: Secondary | ICD-10-CM | POA: Diagnosis present

## 2017-08-28 DIAGNOSIS — Z7982 Long term (current) use of aspirin: Secondary | ICD-10-CM | POA: Insufficient documentation

## 2017-08-28 DIAGNOSIS — S0121XA Laceration without foreign body of nose, initial encounter: Secondary | ICD-10-CM | POA: Insufficient documentation

## 2017-08-28 DIAGNOSIS — W540XXA Bitten by dog, initial encounter: Secondary | ICD-10-CM | POA: Diagnosis not present

## 2017-08-28 DIAGNOSIS — Z79899 Other long term (current) drug therapy: Secondary | ICD-10-CM | POA: Diagnosis not present

## 2017-08-28 DIAGNOSIS — Y929 Unspecified place or not applicable: Secondary | ICD-10-CM | POA: Diagnosis not present

## 2017-08-28 DIAGNOSIS — I2510 Atherosclerotic heart disease of native coronary artery without angina pectoris: Secondary | ICD-10-CM | POA: Insufficient documentation

## 2017-08-28 DIAGNOSIS — Y939 Activity, unspecified: Secondary | ICD-10-CM | POA: Insufficient documentation

## 2017-08-28 DIAGNOSIS — Z951 Presence of aortocoronary bypass graft: Secondary | ICD-10-CM | POA: Diagnosis not present

## 2017-08-28 DIAGNOSIS — I10 Essential (primary) hypertension: Secondary | ICD-10-CM | POA: Insufficient documentation

## 2017-08-28 DIAGNOSIS — Z23 Encounter for immunization: Secondary | ICD-10-CM | POA: Diagnosis not present

## 2017-08-28 MED ORDER — TETANUS-DIPHTH-ACELL PERTUSSIS 5-2.5-18.5 LF-MCG/0.5 IM SUSP
0.5000 mL | Freq: Once | INTRAMUSCULAR | Status: AC
  Administered 2017-08-28: 0.5 mL via INTRAMUSCULAR
  Filled 2017-08-28: qty 0.5

## 2017-08-28 MED ORDER — AMOXICILLIN-POT CLAVULANATE 875-125 MG PO TABS: 1.0000 | ORAL_TABLET | Freq: Two times a day (BID) | ORAL | 0 refills | Status: DC

## 2017-08-28 MED ORDER — LIDOCAINE HCL (PF) 1 % IJ SOLN
30.0000 mL | Freq: Once | INTRAMUSCULAR | Status: AC
  Administered 2017-08-28: 30 mL
  Filled 2017-08-28: qty 30

## 2017-08-28 NOTE — Discharge Instructions (Signed)
Medications: Augmentin  Treatment: Take Augmentin twice daily for 7 days.  Make sure to finish all this medication.  Keep your wound dry for the first 24 hours.  After that, you can wash with warm soapy water.  Apply antibiotic ointment twice daily.  And keep clean and covered.  Follow-up: Please return to the emergency department or see your primary care provider in 2 days for wound check.  You may also need to return in 5-7 days to have your sutures removed, if they have not dissolved or fallen out by then.  Please return to the emergency department or see your doctor immediately if you develop any increasing pain, redness, swelling, drainage, red streaking away from the wound.

## 2017-08-28 NOTE — ED Provider Notes (Signed)
[facility] EMERGENCY DEPARTMENT Provider Note   CSN: [id number] Arrival date & time: 08/28/17  1550     History   Chief Complaint Chief Complaint  Patient presents with  . Animal Bite    HPI Nancy [name] is a 71 y.o. female with history of attention, CAD who presents with dog bite to nose.  Patient reports her own dog bit her all of a sudden when she got a little close to his face.  This is happened in the past.  The dog is up-to-date on all shots.  Patient is unsure when her last tetanus was.  Bleeding was controlled prior to arrival.  No other injuries.  Patient denies any chest pain, shortness of breath, abdominal pain, nausea, vomiting.  HPI  Past Medical History:  Diagnosis Date  . Anxiety state, unspecified   . CHEST PAIN UNSPECIFIED   . COLONIC POLYPS, HX OF   . CORONARY ATHEROSCLEROSIS NATIVE CORONARY ARTERY   . DYSLIPIDEMIA   . Essential hypertension, benign     Patient Active Problem List   Diagnosis Date Noted  . Subdural hematoma ([place]) 07/28/2012  . ANXIETY STATE, UNSPECIFIED 12/24/2009  . CHEST PAIN UNSPECIFIED 08/27/2009  . Hyperlipidemia 08/01/2009  . ESSENTIAL HYPERTENSION, BENIGN 08/01/2009  . Coronary artery disease involving native coronary artery of native heart without angina pectoris 08/01/2009  . CONSTIPATION 06/27/2008  . COLONIC POLYPS, HX OF 06/27/2008    Past Surgical History:  Procedure Laterality Date  . CORONARY ANGIOPLASTY WITH STENT PLACEMENT  2010   3 times  . CORONARY ARTERY BYPASS GRAFT  2009  . NASAL SINUS SURGERY    . POLYPECTOMY      OB History    Gravida Para Term Preterm AB Living   2 2       2    SAB TAB Ectopic Multiple Live Births                   Home Medications    Prior to Admission medications   Medication Sig Start Date End Date Taking? Authorizing Provider  amoxicillin-clavulanate (AUGMENTIN) 875-125 MG tablet Take 1 tablet every 12 (twelve) hours by mouth. 08/28/17   Delshawn Stech,  Bea Graff, PA-C  aspirin 81 MG tablet Take 81 mg by mouth daily.     [provider]  Cholecalciferol (VITAMIN D-3 PO) Take 1 tablet by mouth every other day.     [provider]  Coenzyme Q10 (CO Q 10 PO) Take 1 capsule by mouth every other day.    [provider]  CRESTOR 10 MG tablet TAKE ONE TABLET BY MOUTH ONCE DAILY 02/15/17   Nahser, Wonda Cheng, MD  diphenhydrAMINE (BENADRYL) 25 MG tablet Take 25 mg by mouth at bedtime as needed. For sleep    [provider]  diphenhydramine-acetaminophen (TYLENOL PM) 25-500 MG TABS Take 1 tablet by mouth at bedtime as needed. For insomnia/headache    [provider]  ezetimibe (ZETIA) 10 MG tablet TAKE ONE TABLET BY MOUTH ONCE DAILY 02/15/17   Nahser, Wonda Cheng, MD  Fiber CHEW Chew 1 each by mouth daily.     [provider]  FLAXSEED, LINSEED, PO Take 1 capsule by mouth daily.     [provider]  gabapentin (NEURONTIN) 100 MG capsule Take 1 capsule (100 mg total) by mouth 3 (three) times daily. 05/04/17   Susy Frizzle, MD  losartan (COZAAR) 25 MG tablet TAKE ONE TABLET BY MOUTH ONCE DAILY 02/15/17  Nahser, Wonda Cheng, MD  metoprolol succinate (TOPROL-XL) 25 MG 24 hr tablet TAKE 3 TABLETS BY MOUTH ONCE DAILY 05/17/17   Susy Frizzle, MD  nitroGLYCERIN (NITROSTAT) 0.4 MG SL tablet DISSOLVE ONE TABLET UNDER THE TONGUE EVERY 5 MINUTES AS NEEDED FOR CHEST PAIN.  DO NOT EXCEED A TOTAL OF 3 DOSES IN 15 MINUTES 02/15/17   Nahser, Wonda Cheng, MD  Omega-3 Fatty Acids (EQL OMEGA 3 FISH OIL) 1400 MG CAPS Take 1 tablet by mouth daily.    [provider]  polyethylene glycol (MIRALAX / GLYCOLAX) packet Take 17 g by mouth daily.     [provider]  vitamin E (VITAMIN E) 400 UNIT capsule Take 400 Units by mouth daily.    [provider]    Family History Family History  Problem Relation Age of Onset  . Hypertension Mother   . Cancer Mother        COLON  . Kidney disease Mother    . Heart attack Father   . Hypertension Brother   . Stroke Maternal Grandmother   . Cancer Maternal Grandfather        INTESTINAL     Social History Social History   Tobacco Use  . Smoking status: Never Smoker  . Smokeless tobacco: Never Used  Substance Use Topics  . Alcohol use: Yes    Comment: occasional glass wine  . Drug use: No     Allergies   Alendronate; Atorvastatin; Bactrim [sulfamethoxazole-trimethoprim]; Buprenorphine hcl; Morphine; Morphine and related; Prednisone; Sulfa antibiotics; Sulfasalazine; and Sulfonamide derivatives   Review of Systems Review of Systems  Constitutional: Negative for chills and fever.  HENT: Negative for facial swelling and sore throat.   Respiratory: Negative for shortness of breath.   Cardiovascular: Negative for chest pain.  Gastrointestinal: Negative for abdominal pain, nausea and vomiting.  Genitourinary: Negative for dysuria.  Musculoskeletal: Negative for back pain.  Skin: Positive for wound. Negative for rash.  Neurological: Negative for headaches.  Psychiatric/Behavioral: The patient is not nervous/anxious.      Physical Exam Updated Vital Signs BP (!) 126/52 (BP Location: Left Arm)   Pulse (!) 59   Temp 97.9 F (36.6 C) (Oral)   Resp 16   SpO2 98%   Physical Exam  Constitutional: She appears well-developed and well-nourished. No distress.  HENT:  Head: Normocephalic and atraumatic.  Mouth/Throat: Oropharynx is clear and moist. No oropharyngeal exudate.  Eyes: Conjunctivae are normal. Pupils are equal, round, and reactive to light. Right eye exhibits no discharge. Left eye exhibits no discharge. No scleral icterus.  Neck: Normal range of motion. Neck supple. No thyromegaly present.  Cardiovascular: Normal rate, regular rhythm, normal heart sounds and intact distal pulses. Exam reveals no gallop and no friction rub.  No murmur heard. Pulmonary/Chest: Effort normal and breath sounds normal. No stridor. No  respiratory distress. She has no wheezes. She has no rales.  Abdominal: Soft. Bowel sounds are normal. She exhibits no distension. There is no tenderness. There is no rebound and no guarding.  Musculoskeletal: She exhibits no edema.  Lymphadenopathy:    She has no cervical adenopathy.  Neurological: She is alert. Coordination normal.  Skin: Skin is warm and dry. No rash noted. She is not diaphoretic. No pallor.  Complex laceration to nose; full thickness to distal L nostril; bleeding controlled  Psychiatric: She has a normal mood and affect.  Nursing note and vitals reviewed.       ED Treatments / Results  Labs (all labs  ordered are listed, but only abnormal results are displayed) Labs Reviewed - No data to display  EKG  EKG Interpretation None       Radiology No results found.  Procedures .Marland KitchenLaceration Repair Date/Time: 08/28/2017 7:58 PM Performed by: Frederica Kuster, PA-C Authorized by: Frederica Kuster, PA-C   Consent:    Consent obtained:  Verbal   Consent given by:  Patient   Risks discussed:  Infection, poor cosmetic result and need for additional repair Anesthesia (see MAR for exact dosages):    Anesthesia method:  Topical application and local infiltration   Local anesthetic:  Lidocaine 1% w/o epi Laceration details:    Location:  Face   Face location:  Nose   Length (cm):  3   Depth (mm):  5 Repair type:    Repair type:  Intermediate Pre-procedure details:    Preparation:  Patient was prepped and draped in usual sterile fashion Exploration:    Hemostasis achieved with:  Direct pressure   Wound exploration: wound explored through full range of motion   Treatment:    Area cleansed with:  Saline   Amount of cleaning:  Extensive   Irrigation solution:  Sterile saline   Irrigation volume:  500 mL   Visualized foreign bodies/material removed: no   Skin repair:    Repair method:  Sutures   Suture size:  6-0   Suture material:  Fast-absorbing gut    Suture technique:  Simple interrupted   Number of sutures:  8 Approximation:    Approximation:  Close   Vermilion border: well-aligned   Post-procedure details:    Dressing:  Antibiotic ointment and non-adherent dressing   Patient tolerance of procedure:  Tolerated well, no immediate complications   (including critical care time)  Medications Ordered in ED Medications  Tdap (BOOSTRIX) injection 0.5 mL (0.5 mLs Intramuscular Given 08/28/17 1805)  lidocaine (PF) (XYLOCAINE) 1 % injection 30 mL (30 mLs Infiltration Given by Other 08/28/17 1805)     Initial Impression / Assessment and Plan / ED Course  I have reviewed the triage vital signs and the nursing notes.  Pertinent labs & imaging results that were available during my care of the patient were reviewed by me and considered in my medical decision making (see chart for details).     Patient with dog bite to nose. Dog is UTD on shots. Tetanus updated in ED. Laceration occurred < 12 hours prior to repair.  Complex laceration cosmetically.  Discussed laceration care with pt and answered questions. Pt to f-u for suture removal in 5-7 days if not dissolved. Patient advised to see PCP or return to ED in 2 days for wound check. Strict return precautions discussed including signs of infection. Will discharge home with Augmentin. Patient understands and agrees with plan.  Patient vitals stable throughout ED course and discharged in satisfactory condition.  Patient also evaluated by Dr. Francia Greaves who guided the patient's management and agrees with plan.   Final Clinical Impressions(s) / ED Diagnoses   Final diagnoses:  Dog bite of face, initial encounter    ED Discharge Orders        Ordered    amoxicillin-clavulanate (AUGMENTIN) 875-125 MG tablet  Every 12 hours     08/28/17 1945       Frederica Kuster, Hershal Coria 08/28/17 2004    Valarie Merino, MD 08/28/17 [phone]

## 2017-08-28 NOTE — ED Notes (Signed)
PT states understanding of care given, follow up care, and medication prescribed. PT ambulated from ED to car with a steady gait. 

## 2017-08-28 NOTE — ED Triage Notes (Signed)
Pt reports being bit on her face by her dog, shots are current. Has large laceration to her nose, bleeding controlled at this time.

## 2017-08-30 ENCOUNTER — Telehealth (HOSPITAL_COMMUNITY): Payer: Self-pay | Admitting: *Deleted

## 2017-08-30 NOTE — Telephone Encounter (Signed)
Patient given detailed instructions per Myocardial Perfusion Study Information Sheet for the test on 09/01/17. Patient notified to arrive 15 minutes early and that it is imperative to arrive on time for appointment to keep from having the test rescheduled.  If you need to cancel or reschedule your appointment, please call the office within 24 hours of your appointment. . Patient verbalized understanding. Kirstie Peri

## 2017-08-31 ENCOUNTER — Encounter: Payer: Self-pay | Admitting: Family Medicine

## 2017-08-31 ENCOUNTER — Ambulatory Visit (INDEPENDENT_AMBULATORY_CARE_PROVIDER_SITE_OTHER): Payer: Medicare Other | Admitting: Family Medicine

## 2017-08-31 ENCOUNTER — Ambulatory Visit: Payer: Medicare Other | Admitting: Family Medicine

## 2017-08-31 VITALS — BP 120/60 | HR 86 | Temp 98.2°F | Resp 16 | Ht 65.0 in | Wt 124.0 lb

## 2017-08-31 DIAGNOSIS — I2510 Atherosclerotic heart disease of native coronary artery without angina pectoris: Secondary | ICD-10-CM

## 2017-08-31 DIAGNOSIS — L03211 Cellulitis of face: Secondary | ICD-10-CM

## 2017-08-31 MED ORDER — ALPRAZOLAM 0.5 MG PO TABS: 0.5000 mg | ORAL_TABLET | Freq: Every evening | ORAL | 0 refills | Status: DC | PRN

## 2017-08-31 NOTE — Progress Notes (Signed)
Subjective:    Patient ID: Nancy [name], female    DOB: [birth year], 71 y.o.   MRN: [id number]   Patient was bitten by her dog on her nose Saturday and went to the er where it was repaired.  Was given augmentin but she did not start the med due to fear over anaphylaxis.  No history of PCN allergy.  Now the nose is red hot swollen and painful.  Redness is also on the side of the nose and on the left cheek.   Past Medical History:  Diagnosis Date  . Anxiety state, unspecified   . CHEST PAIN UNSPECIFIED   . COLONIC POLYPS, HX OF   . CORONARY ATHEROSCLEROSIS NATIVE CORONARY ARTERY   . DYSLIPIDEMIA   . Essential hypertension, benign    Past Surgical History:  Procedure Laterality Date  . CORONARY ANGIOPLASTY WITH STENT PLACEMENT  2010   3 times  . CORONARY ARTERY BYPASS GRAFT  2009  . NASAL SINUS SURGERY    . POLYPECTOMY     Current Outpatient Medications on File Prior to Visit  Medication Sig Dispense Refill  . aspirin 81 MG tablet Take 81 mg by mouth daily.     . Cholecalciferol (VITAMIN D-3 PO) Take 1 tablet by mouth every other day.     . Coenzyme Q10 (CO Q 10 PO) Take 1 capsule by mouth every other day.    . CRESTOR 10 MG tablet TAKE ONE TABLET BY MOUTH ONCE DAILY 90 tablet 3  . diphenhydrAMINE (BENADRYL) 25 MG tablet Take 25 mg by mouth at bedtime as needed. For sleep    . diphenhydramine-acetaminophen (TYLENOL PM) 25-500 MG TABS Take 1 tablet by mouth at bedtime as needed. For insomnia/headache    . ezetimibe (ZETIA) 10 MG tablet TAKE ONE TABLET BY MOUTH ONCE DAILY 30 tablet 11  . Fiber CHEW Chew 1 each by mouth daily.     Marland Kitchen FLAXSEED, LINSEED, PO Take 1 capsule by mouth daily.     Marland Kitchen gabapentin (NEURONTIN) 100 MG capsule Take 1 capsule (100 mg total) by mouth 3 (three) times daily. 90 capsule 0  . losartan (COZAAR) 25 MG tablet TAKE ONE TABLET BY MOUTH ONCE DAILY 30 tablet 11  . metoprolol succinate (TOPROL-XL) 25 MG 24 hr tablet TAKE 3 TABLETS BY MOUTH ONCE DAILY 90 tablet 3    . nitroGLYCERIN (NITROSTAT) 0.4 MG SL tablet DISSOLVE ONE TABLET UNDER THE TONGUE EVERY 5 MINUTES AS NEEDED FOR CHEST PAIN.  DO NOT EXCEED A TOTAL OF 3 DOSES IN 15 MINUTES 25 tablet 5  . Omega-3 Fatty Acids (EQL OMEGA 3 FISH OIL) 1400 MG CAPS Take 1 tablet by mouth daily.    . polyethylene glycol (MIRALAX / GLYCOLAX) packet Take 17 g by mouth daily.     . vitamin E (VITAMIN E) 400 UNIT capsule Take 400 Units by mouth daily.    Marland Kitchen amoxicillin-clavulanate (AUGMENTIN) 875-125 MG tablet Take 1 tablet every 12 (twelve) hours by mouth. (Patient not taking: Reported on 08/31/2017) 14 tablet 0   No current facility-administered medications on file prior to visit.    Allergies  Allergen Reactions  . Alendronate Shortness Of Breath and Swelling  . Atorvastatin Other (See Comments)    Muscle cramps  . Bactrim [Sulfamethoxazole-Trimethoprim] Other (See Comments)    Blisters/skin peeling, nausea  . Buprenorphine Hcl Nausea Only  . Morphine Nausea Only    OTHER  . Morphine And Related Nausea Only  . Prednisone Other (See Comments)  Unknown  . Sulfa Antibiotics Itching    Blisters/skin peeling  . Sulfasalazine Itching    Blisters/skin peeling  . Sulfonamide Derivatives Other (See Comments)    Blisters/skin peeling   Social History   Socioeconomic History  . Marital status: Married    Spouse name: Not on file  . Number of children: Not on file  . Years of education: Not on file  . Highest education level: Not on file  Social Needs  . Financial resource strain: Not on file  . Food insecurity - worry: Not on file  . Food insecurity - inability: Not on file  . Transportation needs - medical: Not on file  . Transportation needs - non-medical: Not on file  Occupational History  . Not on file  Tobacco Use  . Smoking status: Never Smoker  . Smokeless tobacco: Never Used  Substance and Sexual Activity  . Alcohol use: Yes    Comment: occasional glass wine  . Drug use: No  . Sexual  activity: Not Currently    Partners: Male    Comment: 1ST INTERCOURSE- 54, PARTNERS- 1 , MARRIED - 37 YRS   Other Topics Concern  . Not on file  Social History Narrative  . Not on file     Review of Systems  All other systems reviewed and are negative.      Objective:   Physical Exam  Cardiovascular: Normal rate, regular rhythm and normal heart sounds.   Pulmonary/Chest: Effort normal and breath sounds normal.  The tip of her nose is red, hot, and painful, diagonal laceration from left superiorly to right inferiorly.  No fluctuance or abscess, erythema on the left cheek  Vitals reviewed.         Assessment & Plan:  Cellulitis of face  Begin Augmentin 875 mg p.o. twice daily for 10 days immediately recheck in 48 hours or sooner if worse        +

## 2017-09-01 ENCOUNTER — Ambulatory Visit (HOSPITAL_COMMUNITY): Payer: Medicare Other | Attending: Cardiology

## 2017-09-01 DIAGNOSIS — E782 Mixed hyperlipidemia: Secondary | ICD-10-CM

## 2017-09-01 DIAGNOSIS — I25118 Atherosclerotic heart disease of native coronary artery with other forms of angina pectoris: Secondary | ICD-10-CM

## 2017-09-01 LAB — MYOCARDIAL PERFUSION IMAGING
CHL CUP NUCLEAR SDS: 2
CHL CUP RESTING HR STRESS: 61 {beats}/min
CSEPPHR: 92 {beats}/min
LVDIAVOL: 41 mL (ref 46–106)
LVSYSVOL: 10 mL
RATE: 0.28
SRS: 11
SSS: 13
TID: 1

## 2017-09-01 MED ORDER — TECHNETIUM TC 99M TETROFOSMIN IV KIT
32.8000 | PACK | Freq: Once | INTRAVENOUS | Status: AC | PRN
Start: 2017-09-01 — End: 2017-09-01
  Administered 2017-09-01: 32.8 via INTRAVENOUS
  Filled 2017-09-01: qty 33

## 2017-09-01 MED ORDER — TECHNETIUM TC 99M TETROFOSMIN IV KIT
9.3000 | PACK | Freq: Once | INTRAVENOUS | Status: AC | PRN
  Administered 2017-09-01: 9.3 via INTRAVENOUS
  Filled 2017-09-01: qty 10

## 2017-09-01 MED ORDER — REGADENOSON 0.4 MG/5ML IV SOLN
0.4000 mg | Freq: Once | INTRAVENOUS | Status: AC
  Administered 2017-09-01: 0.4 mg via INTRAVENOUS

## 2017-09-02 ENCOUNTER — Telehealth: Payer: Self-pay | Admitting: Cardiovascular Disease

## 2017-09-02 ENCOUNTER — Telehealth: Payer: Self-pay | Admitting: Nurse Practitioner

## 2017-09-02 DIAGNOSIS — R0609 Other forms of dyspnea: Principal | ICD-10-CM

## 2017-09-02 DIAGNOSIS — I25118 Atherosclerotic heart disease of native coronary artery with other forms of angina pectoris: Secondary | ICD-10-CM

## 2017-09-02 NOTE — Telephone Encounter (Signed)
Nancy [name] had a normal stress test yesterday.  She continues to have severe shortness of breath with any sort of exertion.  The symptoms are fairly similar to her angina equivalent.  She is not necessarily eager to have a heart catheterization at this point.  I reviewed the Myoview study again today.  The images look normal.  We will get an echocardiogram for further evaluation of her LV function.  If her symptoms continue or worsen I would have a very low threshold to proceed with heart catheterization with possible PCI.    Mertie Moores, MD  09/02/2017 2:32 PM    [facility] [place],  [place], [place]  [address] Pager [phone] Phone: [phone]; Fax: [phone]

## 2017-09-02 NOTE — Telephone Encounter (Signed)
Nancy [name] had a normal stress test yesterday.  She continues to have severe shortness of breath with any sort of exertion.  The symptoms are fairly similar to her angina equivalent.  She is not necessarily eager to have a heart catheterization at this point.  I reviewed the Myoview study again today.  The images look normal.  We will get an echocardiogram for further evaluation of her LV function.  If her symptoms continue or worsen I would have a very low threshold to proceed with heart catheterization with possible PCI.    Mertie Moores, MD  09/02/2017 2:32 PM    [facility] [place],  [place], [place]  [address] Pager [phone] Phone: [phone]; Fax: [phone]   Echocardiogram ordered and message sent to scheduling to get test scheduled

## 2017-09-06 ENCOUNTER — Other Ambulatory Visit: Payer: Self-pay

## 2017-09-06 ENCOUNTER — Ambulatory Visit (HOSPITAL_COMMUNITY): Payer: Medicare Other | Attending: Cardiology

## 2017-09-06 DIAGNOSIS — I25119 Atherosclerotic heart disease of native coronary artery with unspecified angina pectoris: Secondary | ICD-10-CM | POA: Diagnosis not present

## 2017-09-06 DIAGNOSIS — E785 Hyperlipidemia, unspecified: Secondary | ICD-10-CM | POA: Insufficient documentation

## 2017-09-06 DIAGNOSIS — I10 Essential (primary) hypertension: Secondary | ICD-10-CM | POA: Diagnosis not present

## 2017-09-06 DIAGNOSIS — Z8249 Family history of ischemic heart disease and other diseases of the circulatory system: Secondary | ICD-10-CM | POA: Diagnosis not present

## 2017-09-06 DIAGNOSIS — I25118 Atherosclerotic heart disease of native coronary artery with other forms of angina pectoris: Secondary | ICD-10-CM | POA: Diagnosis not present

## 2017-09-06 DIAGNOSIS — R0609 Other forms of dyspnea: Secondary | ICD-10-CM | POA: Insufficient documentation

## 2017-09-07 ENCOUNTER — Ambulatory Visit (INDEPENDENT_AMBULATORY_CARE_PROVIDER_SITE_OTHER): Payer: Medicare Other | Admitting: Family Medicine

## 2017-09-07 ENCOUNTER — Encounter: Payer: Self-pay | Admitting: Family Medicine

## 2017-09-07 VITALS — BP 132/62 | HR 70 | Temp 98.0°F | Resp 14 | Ht 65.0 in | Wt 127.0 lb

## 2017-09-07 DIAGNOSIS — Z4802 Encounter for removal of sutures: Secondary | ICD-10-CM | POA: Diagnosis not present

## 2017-09-07 DIAGNOSIS — I2510 Atherosclerotic heart disease of native coronary artery without angina pectoris: Secondary | ICD-10-CM

## 2017-09-07 NOTE — Progress Notes (Signed)
Subjective:    Patient ID: Nancy [name], female    DOB: 1946-05-19, 71 y.o.   MRN: [id number]  08/31/17 Patient was bitten by her dog on her nose Saturday and went to the er where it was repaired.  Was given augmentin but she did not start the med due to fear over anaphylaxis.  No history of PCN allergy.  Now the nose is red hot swollen and painful.  Redness is also on the side of the nose and on the left cheek.  At that time, my plan was: Begin Augmentin 875 mg p.o. twice daily for 10 days immediately recheck in 48 hours or sooner if worse   09/07/17 Cellulitis on the nose and face have almost completely subsided.  The erythema on the nose and left cheek has almost totally resolved.  The warmth is gone.  The pain has improved.  There is a small scab roughly 6 mm in diameter on the tip of the nose but the nose is healing extremely well with very minimal scarring.  There is a small residual suture that appears to be a 404 5-0 Vicryl sticking out from underneath the scab that the patient would like me to remove.  This was grasped easily with a pair of forceps and the underlying suture was cut and removed without difficulty. Past Medical History:  Diagnosis Date  . Anxiety state, unspecified   . CHEST PAIN UNSPECIFIED   . COLONIC POLYPS, HX OF   . CORONARY ATHEROSCLEROSIS NATIVE CORONARY ARTERY   . DYSLIPIDEMIA   . Essential hypertension, benign    Past Surgical History:  Procedure Laterality Date  . CORONARY ANGIOPLASTY WITH STENT PLACEMENT  2010   3 times  . CORONARY ARTERY BYPASS GRAFT  2009  . CRANIOTOMY  04/27/2012   Procedure: CRANIOTOMY HEMATOMA EVACUATION SUBDURAL;  Surgeon: Eustace Moore, MD;  Location: [facility];  Service: Neurosurgery;  Laterality: Right;  Right Frontal Burr hole for Subdural Hematoma  . NASAL SINUS SURGERY    . POLYPECTOMY     Current Outpatient Medications on File Prior to Visit  Medication Sig Dispense Refill  . ALPRAZolam (XANAX) 0.5 MG tablet Take 1  tablet (0.5 mg total) at bedtime as needed by mouth for anxiety. 30 tablet 0  . amoxicillin-clavulanate (AUGMENTIN) 875-125 MG tablet Take 1 tablet every 12 (twelve) hours by mouth. 14 tablet 0  . aspirin 81 MG tablet Take 81 mg by mouth daily.     . Cholecalciferol (VITAMIN D-3 PO) Take 1 tablet by mouth every other day.     . Coenzyme Q10 (CO Q 10 PO) Take 1 capsule by mouth every other day.    . CRESTOR 10 MG tablet TAKE ONE TABLET BY MOUTH ONCE DAILY 90 tablet 3  . diphenhydrAMINE (BENADRYL) 25 MG tablet Take 25 mg by mouth at bedtime as needed. For sleep    . diphenhydramine-acetaminophen (TYLENOL PM) 25-500 MG TABS Take 1 tablet by mouth at bedtime as needed. For insomnia/headache    . ezetimibe (ZETIA) 10 MG tablet TAKE ONE TABLET BY MOUTH ONCE DAILY 30 tablet 11  . Fiber CHEW Chew 1 each by mouth daily.     Marland Kitchen FLAXSEED, LINSEED, PO Take 1 capsule by mouth daily.     Marland Kitchen gabapentin (NEURONTIN) 100 MG capsule Take 1 capsule (100 mg total) by mouth 3 (three) times daily. 90 capsule 0  . losartan (COZAAR) 25 MG tablet TAKE ONE TABLET BY MOUTH ONCE DAILY 30 tablet 11  .  metoprolol succinate (TOPROL-XL) 25 MG 24 hr tablet TAKE 3 TABLETS BY MOUTH ONCE DAILY 90 tablet 3  . nitroGLYCERIN (NITROSTAT) 0.4 MG SL tablet DISSOLVE ONE TABLET UNDER THE TONGUE EVERY 5 MINUTES AS NEEDED FOR CHEST PAIN.  DO NOT EXCEED A TOTAL OF 3 DOSES IN 15 MINUTES 25 tablet 5  . Omega-3 Fatty Acids (EQL OMEGA 3 FISH OIL) 1400 MG CAPS Take 1 tablet by mouth daily.    . polyethylene glycol (MIRALAX / GLYCOLAX) packet Take 17 g by mouth daily.     . vitamin E (VITAMIN E) 400 UNIT capsule Take 400 Units by mouth daily.     No current facility-administered medications on file prior to visit.    Allergies  Allergen Reactions  . Alendronate Shortness Of Breath and Swelling  . Atorvastatin Other (See Comments)    Muscle cramps  . Bactrim [Sulfamethoxazole-Trimethoprim] Other (See Comments)    Blisters/skin peeling, nausea    . Buprenorphine Hcl Nausea Only  . Morphine Nausea Only    OTHER  . Morphine And Related Nausea Only  . Prednisone Other (See Comments)    Unknown  . Sulfa Antibiotics Itching    Blisters/skin peeling  . Sulfasalazine Itching    Blisters/skin peeling  . Sulfonamide Derivatives Other (See Comments)    Blisters/skin peeling   Social History   Socioeconomic History  . Marital status: Married    Spouse name: Not on file  . Number of children: Not on file  . Years of education: Not on file  . Highest education level: Not on file  Social Needs  . Financial resource strain: Not on file  . Food insecurity - worry: Not on file  . Food insecurity - inability: Not on file  . Transportation needs - medical: Not on file  . Transportation needs - non-medical: Not on file  Occupational History  . Not on file  Tobacco Use  . Smoking status: Never Smoker  . Smokeless tobacco: Never Used  Substance and Sexual Activity  . Alcohol use: Yes    Comment: occasional glass wine  . Drug use: No  . Sexual activity: Not Currently    Partners: Male    Comment: 1ST INTERCOURSE- 50, PARTNERS- 1 , MARRIED - 75 YRS   Other Topics Concern  . Not on file  Social History Narrative  . Not on file     Review of Systems  All other systems reviewed and are negative.      Objective:   Physical Exam  Cardiovascular: Normal rate, regular rhythm and normal heart sounds.   Pulmonary/Chest: Effort normal and breath sounds normal.  The tip of her nose is Skin colored with a 6 mm black scab of dried blood.  However the previous erythema has resolved.  The warmth has resolved.  The erythema on the left cheek has resolved. Vitals reviewed.         Assessment & Plan:  Visit for suture removal Cellulitis has improved.  Complete the antibiotics.  One suture is removed without difficulty.  The remaining sutures appear to have dissolved.  Millimeter scab on the tip of the nose should gradually flake off  over the next 1-2 weeks.  Return immediately if symptoms worsen        +

## 2017-09-20 ENCOUNTER — Other Ambulatory Visit: Payer: Self-pay | Admitting: Family Medicine

## 2017-10-21 ENCOUNTER — Telehealth: Payer: Self-pay | Admitting: Family Medicine

## 2017-10-21 NOTE — Telephone Encounter (Signed)
Patient dropped of form for jury duty would like excuse to be excused from this if possible

## 2017-10-26 NOTE — Telephone Encounter (Signed)
LMTRC

## 2017-10-26 NOTE — Telephone Encounter (Signed)
Per Dr. Dennard Schaumann need to know why she needs to be excused from [employer] duty

## 2017-11-25 ENCOUNTER — Encounter: Payer: Self-pay | Admitting: Family Medicine

## 2017-11-25 ENCOUNTER — Telehealth: Payer: Self-pay | Admitting: Nurse Practitioner

## 2017-11-25 NOTE — Telephone Encounter (Signed)
Received notification from [facility] regarding a recall of patient's Losartan. I called patient and she states she is confused because she also received this notification and she does not use [facility]. I advised her to call [facility] to find out if her Losartan has been recalled. I advised I will call her back tomorrow to find out if new Rx is needed. She verbalized understanding and agreement and thanked me for the call.

## 2017-11-26 NOTE — Telephone Encounter (Signed)
Spoke with patient who states she called [facility] and was told by the pharmacist that her lot of Losartan has not been recalled. She thanked me for the call.

## 2017-11-26 NOTE — Telephone Encounter (Signed)
Spoke to pt and she is having memory issues and pauses in her thoughts. I did recommend an apt but she stated that she had been sing another doctor for it (?). She states that she forgets her grandchildren's names, where she lives etc.  Very hard to follow pt in conservation. If you can write the note I will mail for her.

## 2017-11-29 NOTE — Telephone Encounter (Signed)
I'd like to see the patient if she is having memory loss.

## 2017-11-29 NOTE — Telephone Encounter (Signed)
Pt aware to schedule apt via vm

## 2017-11-30 ENCOUNTER — Encounter: Payer: Self-pay | Admitting: Family Medicine

## 2017-11-30 ENCOUNTER — Ambulatory Visit (INDEPENDENT_AMBULATORY_CARE_PROVIDER_SITE_OTHER): Payer: Medicare Other | Admitting: Family Medicine

## 2017-11-30 VITALS — BP 136/70 | HR 68 | Temp 97.5°F | Resp 14 | Ht 65.0 in | Wt 127.0 lb

## 2017-11-30 DIAGNOSIS — R413 Other amnesia: Secondary | ICD-10-CM | POA: Diagnosis not present

## 2017-11-30 NOTE — Telephone Encounter (Signed)
Pt seen in ov today and letter given at that time

## 2017-11-30 NOTE — Progress Notes (Signed)
Subjective:    Patient ID: Nancy [name], female    DOB: 01/06/1946, 72 y.o.   MRN: [id number]  HPI Patient has a significant past medical history of subdural hematoma status post craniotomy and hematoma evacuation in 2013.  She also has a history of underlying anxiety.  Over the last several months, she has noticed progressively worsening memory problems.  For instance, she was standing in line at a medical office recently.  When I asked her her address, she could not remember her address.  She also has forgotten the names of her grandchildren.  She only has 2 grandchildren and this had her extremely alarmed.  She no longer drives.  She is not getting lost around the house.  She is not left on the stove.  She does not pay any bills.  She is not losing items.  She does report difficulty finding words at times.  I perform Mini-Mental status exam today and she scored 29 out of 30.  She quickly remember the date and the location.  She can remember 2 out of 3 words on recall.  She is able to spell world in reverse and perform serial sevens without difficulty.  She can easily copy intersecting pentagons.  The remainder of the Mini-Mental status exam she does extremely well on with no difficulty or hesitation.  For instance I discussed with her drawing lab work to check her thyroid and B12 level because of her memory loss.  I have then forgot the lab work after our visit.  The patient walked to her car and then returned into the building stating that she believed I wanted to draw blood work.  She remembered this when I forgot it. Past Medical History:  Diagnosis Date  . Anxiety state, unspecified   . CHEST PAIN UNSPECIFIED   . COLONIC POLYPS, HX OF   . CORONARY ATHEROSCLEROSIS NATIVE CORONARY ARTERY   . DYSLIPIDEMIA   . Essential hypertension, benign    Past Surgical History:  Procedure Laterality Date  . CORONARY ANGIOPLASTY WITH STENT PLACEMENT  2010   3 times  . CORONARY ARTERY BYPASS GRAFT  2009   . CRANIOTOMY  04/27/2012   Procedure: CRANIOTOMY HEMATOMA EVACUATION SUBDURAL;  Surgeon: Eustace Moore, MD;  Location: [place] [facility];  Service: Neurosurgery;  Laterality: Right;  Right Frontal Burr hole for Subdural Hematoma  . NASAL SINUS SURGERY    . POLYPECTOMY     Current Outpatient Medications on File Prior to Visit  Medication Sig Dispense Refill  . ALPRAZolam (XANAX) 0.5 MG tablet Take 1 tablet (0.5 mg total) at bedtime as needed by mouth for anxiety. 30 tablet 0  . aspirin 81 MG tablet Take 81 mg by mouth daily.     . Cholecalciferol (VITAMIN D-3 PO) Take 1 tablet by mouth every other day.     . Coenzyme Q10 (CO Q 10 PO) Take 1 capsule by mouth every other day.    . CRESTOR 10 MG tablet TAKE ONE TABLET BY MOUTH ONCE DAILY 90 tablet 3  . diphenhydrAMINE (BENADRYL) 25 MG tablet Take 25 mg by mouth at bedtime as needed. For sleep    . diphenhydramine-acetaminophen (TYLENOL PM) 25-500 MG TABS Take 1 tablet by mouth at bedtime as needed. For insomnia/headache    . ezetimibe (ZETIA) 10 MG tablet TAKE ONE TABLET BY MOUTH ONCE DAILY 30 tablet 11  . Fiber CHEW Chew 1 each by mouth daily.     Marland Kitchen FLAXSEED, LINSEED, PO Take 1  capsule by mouth daily.     Marland Kitchen gabapentin (NEURONTIN) 100 MG capsule Take 1 capsule (100 mg total) by mouth 3 (three) times daily. 90 capsule 0  . losartan (COZAAR) 25 MG tablet TAKE ONE TABLET BY MOUTH ONCE DAILY 30 tablet 11  . metoprolol succinate (TOPROL-XL) 25 MG 24 hr tablet TAKE 3 TABLETS BY MOUTH ONCE DAILY 90 tablet 3  . nitroGLYCERIN (NITROSTAT) 0.4 MG SL tablet DISSOLVE ONE TABLET UNDER THE TONGUE EVERY 5 MINUTES AS NEEDED FOR CHEST PAIN.  DO NOT EXCEED A TOTAL OF 3 DOSES IN 15 MINUTES 25 tablet 5  . Omega-3 Fatty Acids (EQL OMEGA 3 FISH OIL) 1400 MG CAPS Take 1 tablet by mouth daily.    . polyethylene glycol (MIRALAX / GLYCOLAX) packet Take 17 g by mouth daily.     . vitamin E (VITAMIN E) 400 UNIT capsule Take 400 Units by mouth daily.     No current  facility-administered medications on file prior to visit.    Allergies  Allergen Reactions  . Alendronate Shortness Of Breath and Swelling  . Atorvastatin Other (See Comments)    Muscle cramps  . Bactrim [Sulfamethoxazole-Trimethoprim] Other (See Comments)    Blisters/skin peeling, nausea  . Buprenorphine Hcl Nausea Only  . Morphine Nausea Only    OTHER  . Morphine And Related Nausea Only  . Prednisone Other (See Comments)    Unknown  . Sulfa Antibiotics Itching    Blisters/skin peeling  . Sulfasalazine Itching    Blisters/skin peeling  . Sulfonamide Derivatives Other (See Comments)    Blisters/skin peeling   Social History   Socioeconomic History  . Marital status: Married    Spouse name: Not on file  . Number of children: Not on file  . Years of education: Not on file  . Highest education level: Not on file  Social Needs  . Financial resource strain: Not on file  . Food insecurity - worry: Not on file  . Food insecurity - inability: Not on file  . Transportation needs - medical: Not on file  . Transportation needs - non-medical: Not on file  Occupational History  . Not on file  Tobacco Use  . Smoking status: Never Smoker  . Smokeless tobacco: Never Used  Substance and Sexual Activity  . Alcohol use: Yes    Comment: occasional glass wine  . Drug use: No  . Sexual activity: Not Currently    Partners: Male    Comment: 1ST INTERCOURSE- 52, PARTNERS- 1 , MARRIED - 4 YRS   Other Topics Concern  . Not on file  Social History Narrative  . Not on file     Review of Systems  All other systems reviewed and are negative.      Objective:   Physical Exam  Constitutional: She is oriented to person, place, and time. She appears well-developed and well-nourished.  Cardiovascular: Normal rate, regular rhythm and normal heart sounds. Exam reveals no gallop and no friction rub.  No murmur heard. Pulmonary/Chest: Effort normal and breath sounds normal. No respiratory  distress. She has no wheezes. She has no rales.  Neurological: She is alert and oriented to person, place, and time. She has normal reflexes. She displays normal reflexes. No cranial nerve deficit. She exhibits normal muscle tone. Coordination normal.  Psychiatric: Her speech is normal and behavior is normal. Judgment and thought content normal. Her mood appears anxious. Cognition and memory are normal.  Vitals reviewed.  Assessment & Plan:  Memory loss - Plan: Neuropsychological testing, RPR, Vitamin B12, TSH  Based on the patient's Mini-Mental status exam, I do not believe that she has underlying dementia.  I believe she may be easily distracted similar to ADD or perhaps loses focus because of anxiety rather than having underlying dementia.  Perhaps she could even have some mild short-term memory loss due to her previous subdural hematoma.  However she is very concerned after having forgotten her grandchildren's name.  Therefore I have recommended neuropsychological testing through [facility] neurolog[facility] to determine if there may be some mild underlying memory problems.  I will also check a TSH, and RPR and a vitamin B12 level

## 2017-12-01 LAB — TSH: TSH: 1.67 m[IU]/L (ref 0.40–4.50)

## 2017-12-01 LAB — RPR: RPR: NONREACTIVE

## 2017-12-01 LAB — VITAMIN B12: VITAMIN B 12: 438 pg/mL (ref 200–1100)

## 2017-12-02 ENCOUNTER — Encounter: Payer: Self-pay | Admitting: Family Medicine

## 2018-01-14 ENCOUNTER — Other Ambulatory Visit: Payer: Self-pay | Admitting: Cardiovascular Disease

## 2018-01-14 ENCOUNTER — Other Ambulatory Visit: Payer: Self-pay | Admitting: Family Medicine

## 2018-01-17 ENCOUNTER — Telehealth: Payer: Self-pay | Admitting: Family Medicine

## 2018-01-17 NOTE — Telephone Encounter (Signed)
PATIENT SAYS SHE SUPPOSED TO HAVE AN APPT WITH A NEUROLOGISTS, SHE SAID THE LAST TIME SHE WAS SEEN WITH DR PICKARD HE WAS SUPPOSED TO PLACE THE REFERRAL, HOWEVER I DO NOT SEE THIS, COULD WE LOOK? AND PLACE REFERRAL, PATIENT IS ASKING

## 2018-01-18 ENCOUNTER — Other Ambulatory Visit: Payer: Self-pay | Admitting: Family Medicine

## 2018-01-18 ENCOUNTER — Encounter: Payer: Self-pay | Admitting: Neurology

## 2018-01-18 DIAGNOSIS — R413 Other amnesia: Secondary | ICD-10-CM

## 2018-01-18 NOTE — Telephone Encounter (Signed)
Not sure what happened.  I apologize.  I will order.

## 2018-02-01 ENCOUNTER — Ambulatory Visit (INDEPENDENT_AMBULATORY_CARE_PROVIDER_SITE_OTHER): Payer: Medicare Other | Admitting: Family Medicine

## 2018-02-01 ENCOUNTER — Encounter: Payer: Self-pay | Admitting: Family Medicine

## 2018-02-01 VITALS — BP 154/70 | HR 64 | Temp 97.9°F | Resp 16 | Ht 65.0 in | Wt 126.0 lb

## 2018-02-01 DIAGNOSIS — M810 Age-related osteoporosis without current pathological fracture: Secondary | ICD-10-CM | POA: Diagnosis not present

## 2018-02-01 DIAGNOSIS — M5431 Sciatica, right side: Secondary | ICD-10-CM | POA: Diagnosis not present

## 2018-02-01 DIAGNOSIS — M4807 Spinal stenosis, lumbosacral region: Secondary | ICD-10-CM

## 2018-02-01 MED ORDER — PREDNISONE 20 MG PO TABS: ORAL_TABLET | ORAL | 0 refills | Status: DC

## 2018-02-01 NOTE — Progress Notes (Signed)
Subjective:    Patient ID: Nancy [name], female    DOB: 01/24/1946, 72 y.o.   MRN: [id number]  HPI Patient presents today with 2 weeks of pain beginning in her right gluteus and radiating down the posterior aspect of her right thigh crossing around the knee to the anterior surface of her right shin and into her toes.  She reports burning stinging pain in her right toes as well as some numbness in her right foot.  Symptoms are consistent with right-sided sciatica.  Patient denies any falls or injuries.  I reviewed her past medical records.  She had an MRI of the lumbar spine in 2016 with the results dictated below: IMPRESSION: Mild compression fracture L3 with bone marrow edema suggesting healing fracture. Correlate with any recent injury. This is most likely a benign fracture. If pain progresses, or there is no history of injury, postcontrast imaging suggested to rule out tumor.  5 mm anterior slip L3-4 with mild spinal stenosis.  Mild spinal stenosis and subarticular stenosis bilaterally L4-5.   At that time, a bone density test was performed which revealed osteoporosis with a T score of -3.  Patient was started on Fosamax however at some point Fosamax was discontinued due to a presumed allergy.  Patient reported swelling on the Fosamax and stopped the medication.  At the present time she is not taking any calcium or vitamin D or any bone building therapy. Past Medical History:  Diagnosis Date  . Anxiety state, unspecified   . CHEST PAIN UNSPECIFIED   . COLONIC POLYPS, HX OF   . CORONARY ATHEROSCLEROSIS NATIVE CORONARY ARTERY   . DYSLIPIDEMIA   . Essential hypertension, benign    Past Surgical History:  Procedure Laterality Date  . CORONARY ANGIOPLASTY WITH STENT PLACEMENT  2010   3 times  . CORONARY ARTERY BYPASS GRAFT  2009  . CRANIOTOMY  04/27/2012   Procedure: CRANIOTOMY HEMATOMA EVACUATION SUBDURAL;  Surgeon: Eustace Moore, MD;  Location: [place] [facility];  Service:  Neurosurgery;  Laterality: Right;  Right Frontal Burr hole for Subdural Hematoma  . NASAL SINUS SURGERY    . POLYPECTOMY     Current Outpatient Medications on File Prior to Visit  Medication Sig Dispense Refill  . ALPRAZolam (XANAX) 0.5 MG tablet Take 1 tablet (0.5 mg total) at bedtime as needed by mouth for anxiety. 30 tablet 0  . aspirin 81 MG tablet Take 81 mg by mouth daily.     . Cholecalciferol (VITAMIN D-3 PO) Take 1 tablet by mouth every other day.     . Coenzyme Q10 (CO Q 10 PO) Take 1 capsule by mouth every other day.    . diphenhydrAMINE (BENADRYL) 25 MG tablet Take 25 mg by mouth at bedtime as needed. For sleep    . diphenhydramine-acetaminophen (TYLENOL PM) 25-500 MG TABS Take 1 tablet by mouth at bedtime as needed. For insomnia/headache    . ezetimibe (ZETIA) 10 MG tablet TAKE ONE TABLET BY MOUTH ONCE DAILY 30 tablet 11  . Fiber CHEW Chew 1 each by mouth daily.     Marland Kitchen FLAXSEED, LINSEED, PO Take 1 capsule by mouth daily.     Marland Kitchen gabapentin (NEURONTIN) 100 MG capsule Take 1 capsule (100 mg total) by mouth 3 (three) times daily. 90 capsule 0  . losartan (COZAAR) 25 MG tablet TAKE ONE TABLET BY MOUTH ONCE DAILY 30 tablet 11  . metoprolol succinate (TOPROL-XL) 25 MG 24 hr tablet TAKE 3 TABLETS BY MOUTH ONCE DAILY  90 tablet 3  . nitroGLYCERIN (NITROSTAT) 0.4 MG SL tablet DISSOLVE ONE TABLET UNDER THE TONGUE EVERY 5 MINUTES AS NEEDED FOR CHEST PAIN.  DO NOT EXCEED A TOTAL OF 3 DOSES IN 15 MINUTES 25 tablet 5  . Omega-3 Fatty Acids (EQL OMEGA 3 FISH OIL) 1400 MG CAPS Take 1 tablet by mouth daily.    . polyethylene glycol (MIRALAX / GLYCOLAX) packet Take 17 g by mouth daily.     . rosuvastatin (CRESTOR) 10 MG tablet TAKE 1 TABLET BY MOUTH ONCE DAILY 90 tablet 2  . vitamin E (VITAMIN E) 400 UNIT capsule Take 400 Units by mouth daily.     No current facility-administered medications on file prior to visit.    Allergies  Allergen Reactions  . Alendronate Shortness Of Breath and Swelling    . Atorvastatin Other (See Comments)    Muscle cramps  . Bactrim [Sulfamethoxazole-Trimethoprim] Other (See Comments)    Blisters/skin peeling, nausea  . Buprenorphine Hcl Nausea Only  . Morphine Nausea Only    OTHER  . Morphine And Related Nausea Only  . Prednisone Other (See Comments)    Unknown  . Sulfa Antibiotics Itching    Blisters/skin peeling  . Sulfasalazine Itching    Blisters/skin peeling  . Sulfonamide Derivatives Other (See Comments)    Blisters/skin peeling   Social History   Socioeconomic History  . Marital status: Married    Spouse name: Not on file  . Number of children: Not on file  . Years of education: Not on file  . Highest education level: Not on file  Occupational History  . Not on file  Social Needs  . Financial resource strain: Not on file  . Food insecurity:    Worry: Not on file    Inability: Not on file  . Transportation needs:    Medical: Not on file    Non-medical: Not on file  Tobacco Use  . Smoking status: Never Smoker  . Smokeless tobacco: Never Used  Substance and Sexual Activity  . Alcohol use: Yes    Comment: occasional glass wine  . Drug use: No  . Sexual activity: Not Currently    Partners: Male    Comment: 1ST INTERCOURSE- 8, PARTNERS- 1 , MARRIED - 28 YRS   Lifestyle  . Physical activity:    Days per week: Not on file    Minutes per session: Not on file  . Stress: Not on file  Relationships  . Social connections:    Talks on phone: Not on file    Gets together: Not on file    Attends religious service: Not on file    Active member of club or organization: Not on file    Attends meetings of clubs or organizations: Not on file    Relationship status: Not on file  . Intimate partner violence:    Fear of current or ex partner: Not on file    Emotionally abused: Not on file    Physically abused: Not on file    Forced sexual activity: Not on file  Other Topics Concern  . Not on file  Social History Narrative  . Not  on file      Review of Systems  All other systems reviewed and are negative.      Objective:   Physical Exam  Constitutional: She is oriented to person, place, and time.  Cardiovascular: Normal rate, regular rhythm and normal heart sounds.  No murmur heard. Pulmonary/Chest: Effort normal and breath  sounds normal. No respiratory distress. She has no wheezes. She has no rales.  Musculoskeletal:       Lumbar back: She exhibits pain. She exhibits normal range of motion, no tenderness, no bony tenderness and no spasm.  Neurological: She is alert and oriented to person, place, and time. She displays normal reflexes. She exhibits normal muscle tone. Coordination normal.  Vitals reviewed.         Assessment & Plan:  Spinal stenosis of lumbosacral region - Plan: DG Lumbar Spine Complete  Right sided sciatica  Age-related osteoporosis without current pathological fracture  Osteoporosis, unspecified osteoporosis type, unspecified pathological fracture presence  I believe the patient likely has right-sided sciatica secondary to her underlying spinal stenosis.  I recommended an x-ray of the lumbar spine to evaluate further.  Meanwhile start the patient on prednisone taper pack as the patient sciatica is unrelieved on gabapentin alone.  Second issue is her underlying osteoporosis that at the present time is going untreated.  I have recommended calcium 1200 mg a day, vitamin D 1000 units a day, and since the patient cannot tolerate Fosamax, I have recommended switching to Prolia injections every 6 months.

## 2018-02-14 ENCOUNTER — Other Ambulatory Visit: Payer: Self-pay | Admitting: Cardiovascular Disease

## 2018-02-17 ENCOUNTER — Ambulatory Visit (INDEPENDENT_AMBULATORY_CARE_PROVIDER_SITE_OTHER): Payer: Medicare Other | Admitting: *Deleted

## 2018-02-17 DIAGNOSIS — M810 Age-related osteoporosis without current pathological fracture: Secondary | ICD-10-CM | POA: Diagnosis not present

## 2018-02-17 MED ORDER — DENOSUMAB 60 MG/ML ~~LOC~~ SOSY
60.0000 mg | PREFILLED_SYRINGE | Freq: Once | SUBCUTANEOUS | Status: AC
  Administered 2018-02-17: 60 mg via SUBCUTANEOUS

## 2018-02-17 NOTE — Progress Notes (Signed)
Patient seen in office for Prolia injection.   Administered in R arm.   Tolerated IM administration well.

## 2018-03-11 ENCOUNTER — Other Ambulatory Visit: Payer: Self-pay | Admitting: Cardiovascular Disease

## 2018-03-11 MED ORDER — NITROGLYCERIN 0.4 MG SL SUBL: SUBLINGUAL_TABLET | SUBLINGUAL | 0 refills | Status: DC

## 2018-03-15 ENCOUNTER — Other Ambulatory Visit: Payer: Self-pay | Admitting: Cardiovascular Disease

## 2018-03-21 ENCOUNTER — Other Ambulatory Visit (INDEPENDENT_AMBULATORY_CARE_PROVIDER_SITE_OTHER): Payer: Medicare Other

## 2018-03-21 ENCOUNTER — Encounter: Payer: Self-pay | Admitting: Gastroenterology

## 2018-03-21 ENCOUNTER — Ambulatory Visit (INDEPENDENT_AMBULATORY_CARE_PROVIDER_SITE_OTHER): Payer: Medicare Other | Admitting: Gastroenterology

## 2018-03-21 VITALS — BP 120/60 | HR 74 | Ht 64.0 in | Wt 129.4 lb

## 2018-03-21 DIAGNOSIS — R109 Unspecified abdominal pain: Secondary | ICD-10-CM

## 2018-03-21 LAB — CBC WITH DIFFERENTIAL/PLATELET
Basophils Absolute: 0.1 10*3/uL (ref 0.0–0.1)
Basophils Relative: 1.4 % (ref 0.0–3.0)
EOS ABS: 0.2 10*3/uL (ref 0.0–0.7)
Eosinophils Relative: 2.6 % (ref 0.0–5.0)
HCT: 40.6 % (ref 36.0–46.0)
HEMOGLOBIN: 13.9 g/dL (ref 12.0–15.0)
Lymphocytes Relative: 28.4 % (ref 12.0–46.0)
Lymphs Abs: 1.9 10*3/uL (ref 0.7–4.0)
MCHC: 34.2 g/dL (ref 30.0–36.0)
MCV: 92.9 fl (ref 78.0–100.0)
MONO ABS: 0.7 10*3/uL (ref 0.1–1.0)
Monocytes Relative: 10.7 % (ref 3.0–12.0)
NEUTROS PCT: 56.9 % (ref 43.0–77.0)
Neutro Abs: 3.7 10*3/uL (ref 1.4–7.7)
Platelets: 219 10*3/uL (ref 150.0–400.0)
RBC: 4.37 Mil/uL (ref 3.87–5.11)
RDW: 13 % (ref 11.5–15.5)
WBC: 6.6 10*3/uL (ref 4.0–10.5)

## 2018-03-21 LAB — COMPREHENSIVE METABOLIC PANEL
ALBUMIN: 4.6 g/dL (ref 3.5–5.2)
ALT: 18 U/L (ref 0–35)
AST: 20 U/L (ref 0–37)
Alkaline Phosphatase: 52 U/L (ref 39–117)
BILIRUBIN TOTAL: 0.4 mg/dL (ref 0.2–1.2)
BUN: 13 mg/dL (ref 6–23)
CO2: 31 mEq/L (ref 19–32)
CREATININE: 0.99 mg/dL (ref 0.40–1.20)
Calcium: 9.8 mg/dL (ref 8.4–10.5)
Chloride: 107 mEq/L (ref 96–112)
GFR: 58.58 mL/min — ABNORMAL LOW (ref >60.00)
Glucose, Bld: 101 mg/dL — ABNORMAL HIGH (ref 70–99)
Potassium: 4.5 mEq/L (ref 3.5–5.1)
SODIUM: 144 meq/L (ref 135–145)
TOTAL PROTEIN: 7.2 g/dL (ref 6.0–8.3)

## 2018-03-21 NOTE — Progress Notes (Signed)
Review of pertinent gastrointestinal problems: 1.  Routine risk for colon cancer: Colonoscopy in [facility] in 2006 found an 8 mm adenoma, this is considered a low risk adenoma now.  Colonoscopy 2009 Dr. Ardis Hughs showed no polyps.  Colonoscopy November 2014 Dr. Ardis Hughs found left-sided diverticulosis, a single 3 mm polyp was removed and it was not adenomatous.  Recall recommended at 10-year interval (single remote LRA).  HPI: This is a very pleasant 72 year old woman who was referred to me by Susy Frizzle, MD  to evaluate lower abdominal pain.    Chief complaint is right lower quadrant pain, bilateral lower abdominal tenderness  RLQ pain, intermittely.  First about a year ago. Will be a steady pain for several hours. Certain body positions make it worse. Bending over, dressing can exacerbate.  Eating food  does not cause the pain.  Having bowel movements does not make it better or worse.  Stable weight.  She has had no fevers or chills.  She does not have urinary disturbance.  She moves her bowels daily without really any issues.  Mild nausea at times.   Review of systems: Pertinent positive and negative review of systems were noted in the above HPI section. All other review negative.   Past Medical History:  Diagnosis Date  . Anxiety state, unspecified   . CHEST PAIN UNSPECIFIED   . COLONIC POLYPS, HX OF   . CORONARY ATHEROSCLEROSIS NATIVE CORONARY ARTERY   . DYSLIPIDEMIA   . Essential hypertension, benign   . Osteoporosis     Past Surgical History:  Procedure Laterality Date  . CORONARY ANGIOPLASTY WITH STENT PLACEMENT  2010   3 times  . CORONARY ARTERY BYPASS GRAFT  2009  . CRANIOTOMY  04/27/2012   Procedure: CRANIOTOMY HEMATOMA EVACUATION SUBDURAL;  Surgeon: Eustace Moore, MD;  Location: [place] [facility];  Service: Neurosurgery;  Laterality: Right;  Right Frontal Burr hole for Subdural Hematoma  . NASAL SINUS SURGERY    . POLYPECTOMY      Current Outpatient Medications   Medication Sig Dispense Refill  . ALPRAZolam (XANAX) 0.5 MG tablet Take 1 tablet (0.5 mg total) at bedtime as needed by mouth for anxiety. 30 tablet 0  . aspirin 81 MG tablet Take 81 mg by mouth daily.     . Cholecalciferol (VITAMIN D-3 PO) Take 1 tablet by mouth every other day.     . diphenhydrAMINE (BENADRYL) 25 MG tablet Take 25 mg by mouth at bedtime as needed. For sleep    . diphenhydramine-acetaminophen (TYLENOL PM) 25-500 MG TABS Take 1 tablet by mouth at bedtime as needed. For insomnia/headache    . ezetimibe (ZETIA) 10 MG tablet TAKE 1 TABLET BY MOUTH ONCE DAILY 30 tablet 5  . losartan (COZAAR) 25 MG tablet TAKE 1 TABLET BY MOUTH ONCE DAILY 30 tablet 5  . metoprolol succinate (TOPROL-XL) 25 MG 24 hr tablet TAKE 3 TABLETS BY MOUTH ONCE DAILY 90 tablet 3  . nitroGLYCERIN (NITROSTAT) 0.4 MG SL tablet DISSOLVE ONE TABLET UNDER THE TONGUE EVERY 5 MINUTES AS NEEDED FOR CHEST PAIN.  DO NOT EXCEED A TOTAL OF 3 DOSES IN 15 MINUTES 75 tablet 0  . Omega-3 Fatty Acids (EQL OMEGA 3 FISH OIL) 1400 MG CAPS Take 1 tablet by mouth daily.    . polyethylene glycol (MIRALAX / GLYCOLAX) packet Take 17 g by mouth daily.     . predniSONE (DELTASONE) 20 MG tablet 3 tabs poqday 1-2, 2 tabs poqday 3-4, 1 tab poqday 5-6 12 tablet  0  . rosuvastatin (CRESTOR) 10 MG tablet TAKE 1 TABLET BY MOUTH ONCE DAILY 90 tablet 2  . vitamin E (VITAMIN E) 400 UNIT capsule Take 400 Units by mouth daily.    . Coenzyme Q10 (CO Q 10 PO) Take 1 capsule by mouth every other day.     No current facility-administered medications for this visit.     Allergies as of 03/21/2018 - Review Complete 03/21/2018  Allergen Reaction Noted  . Alendronate Shortness Of Breath and Swelling 01/04/2017  . Atorvastatin Other (See Comments) 06/28/2014  . Bactrim [sulfamethoxazole-trimethoprim] Other (See Comments)   . Buprenorphine hcl Nausea Only 01/08/2016  . Morphine Nausea Only 04/19/2017  . Morphine and related Nausea Only 03/20/2011  .  Prednisone Other (See Comments)   . Sulfa antibiotics Itching 01/08/2016  . Sulfasalazine Itching 01/08/2016  . Sulfonamide derivatives Other (See Comments)     Family History  Problem Relation Age of Onset  . Hypertension Mother   . Cancer Mother        COLON  . Kidney disease Mother   . Heart attack Father   . Hypertension Brother   . Stroke Maternal Grandmother   . Cancer Maternal Grandfather        INTESTINAL     Social History   Socioeconomic History  . Marital status: Married    Spouse name: Not on file  . Number of children: Not on file  . Years of education: Not on file  . Highest education level: Not on file  Occupational History  . Not on file  Social Needs  . Financial resource strain: Not on file  . Food insecurity:    Worry: Not on file    Inability: Not on file  . Transportation needs:    Medical: Not on file    Non-medical: Not on file  Tobacco Use  . Smoking status: Never Smoker  . Smokeless tobacco: Never Used  Substance and Sexual Activity  . Alcohol use: Yes    Comment: occasional glass wine  . Drug use: No  . Sexual activity: Not Currently    Partners: Male    Comment: 1ST INTERCOURSE- 29, PARTNERS- 1 , MARRIED - 27 YRS   Lifestyle  . Physical activity:    Days per week: Not on file    Minutes per session: Not on file  . Stress: Not on file  Relationships  . Social connections:    Talks on phone: Not on file    Gets together: Not on file    Attends religious service: Not on file    Active member of club or organization: Not on file    Attends meetings of clubs or organizations: Not on file    Relationship status: Not on file  . Intimate partner violence:    Fear of current or ex partner: Not on file    Emotionally abused: Not on file    Physically abused: Not on file    Forced sexual activity: Not on file  Other Topics Concern  . Not on file  Social History Narrative  . Not on file     Physical Exam: BP 120/60   Pulse 74    Ht 5\' 4"  (1.626 m)   Wt 129 lb 6.4 oz (58.7 kg)   BMI 22.21 kg/m  Constitutional: generally well-appearing Psychiatric: alert and oriented x3 Eyes: extraocular movements intact Mouth: oral pharynx moist, no lesions Neck: supple no lymphadenopathy Cardiovascular: heart regular rate and rhythm Lungs: clear to auscultation  bilaterally Abdomen: soft, mildly tender bilateral lower quadrants, nondistended, no obvious ascites, no peritoneal signs, normal bowel sounds Extremities: no lower extremity edema bilaterally Skin: no lesions on visible extremities   Assessment and plan: 72 y.o. female with right lower quadrant pain, bilateral lower abdominal tenderness  I am not sure what is causing this.  Pains are slightly positional so perhaps this is musculoskeletal.  Perhaps related to some low back pains that she is been having, pinched nerve may be.  Eating does not cause the pain moving her bowels does not exacerbate it.  She is not losing weight and so my suspicion for underlying neoplasm is low.  She clearly is bothered by this and I think starting with lab tests and imaging is reasonable.  CT scan abdomen pelvis with IV and oral contrast as well as labs including a CBC and complete metabolic profile.  Pending those results she might need other testing   Please see the "Patient Instructions" section for addition details about the plan.   Owens Loffler, MD [facility] 03/21/2018, 3:50 PM  Cc: Susy Frizzle, MD

## 2018-03-21 NOTE — Patient Instructions (Addendum)
You will be set up for a CT scan of abdomen and pelvis with IV and oral contrast (lower abd pain). You will have labs checked today in the basement lab.  Please head down after you check out with the front desk  (cbc, cmet). Further testing pending the results of the above.  You have been scheduled for a CT scan of the abdomen and pelvis at [facility] ([address]---this is in the same building as Press [facility]).   You are scheduled on 03/31/18 at 3pm. You should arrive 15 minutes prior to your appointment time for registration. Please follow the written instructions below on the day of your exam:  WARNING: IF YOU ARE ALLERGIC TO IODINE/X-RAY DYE, PLEASE NOTIFY RADIOLOGY IMMEDIATELY AT [phone]! YOU WILL BE GIVEN A 13 HOUR PREMEDICATION PREP.  1) Do not eat or drink anything after 10am (4 hours prior to your test) 2) You have been given 2 bottles of oral contrast to drink. The solution may taste better if refrigerated, but do NOT add ice or any other liquid to this solution. Shake well before drinking.    Drink 1 bottle of contrast @ 1pm (2 hours prior to your exam)  Drink 1 bottle of contrast @ 2pm (1 hour prior to your exam)  You may take any medications as prescribed with a small amount of water except for the following: Metformin, Glucophage, Glucovance, Avandamet, Riomet, Fortamet, Actoplus Met, Janumet, Glumetza or Metaglip. The above medications must be held the day of the exam AND 48 hours after the exam.  The purpose of you drinking the oral contrast is to aid in the visualization of your intestinal tract. The contrast solution may cause some diarrhea. Before your exam is started, you will be given a small amount of fluid to drink. Depending on your individual set of symptoms, you may also receive an intravenous injection of x-ray contrast/dye. Plan on being at [facility] for 30 minutes or longer, depending on the type of exam you are having  performed.  This test typically takes 30-45 minutes to complete.  If you have any questions regarding your exam or if you need to reschedule, you may call the CT department at [phone] between the hours of 8:00 am and 5:00 pm, Monday-Friday.  ________________________________________________________________________

## 2018-03-25 ENCOUNTER — Telehealth: Payer: Self-pay | Admitting: Gastroenterology

## 2018-03-25 NOTE — Telephone Encounter (Signed)
Patient was notified of her lab results and she voiced understanding.

## 2018-03-25 NOTE — Telephone Encounter (Signed)
-----   Message from Milus Banister, MD sent at 03/24/2018  4:15 AM EDT -----   Please call the patient.  The labs were all normal.  Should continue with the suggestions outlined at recent visit.

## 2018-03-31 ENCOUNTER — Ambulatory Visit (INDEPENDENT_AMBULATORY_CARE_PROVIDER_SITE_OTHER)
Admission: RE | Admit: 2018-03-31 | Discharge: 2018-03-31 | Disposition: A | Discharge status: Home / Self Care | Payer: Medicare Other | Source: Ambulatory Visit | Attending: Gastroenterology | Admitting: Gastroenterology

## 2018-03-31 DIAGNOSIS — R109 Unspecified abdominal pain: Secondary | ICD-10-CM

## 2018-03-31 DIAGNOSIS — K5730 Diverticulosis of large intestine without perforation or abscess without bleeding: Secondary | ICD-10-CM | POA: Diagnosis not present

## 2018-03-31 MED ORDER — IOPAMIDOL (ISOVUE-300) INJECTION 61%
100.0000 mL | Freq: Once | INTRAVENOUS | Status: AC | PRN
  Administered 2018-03-31: 100 mL via INTRAVENOUS

## 2018-04-05 ENCOUNTER — Other Ambulatory Visit: Payer: Self-pay | Admitting: *Deleted

## 2018-04-05 DIAGNOSIS — R935 Abnormal findings on diagnostic imaging of other abdominal regions, including retroperitoneum: Secondary | ICD-10-CM

## 2018-04-05 DIAGNOSIS — R1032 Left lower quadrant pain: Secondary | ICD-10-CM

## 2018-04-06 ENCOUNTER — Ambulatory Visit (INDEPENDENT_AMBULATORY_CARE_PROVIDER_SITE_OTHER): Payer: Medicare Other | Admitting: Neurology

## 2018-04-06 ENCOUNTER — Other Ambulatory Visit: Payer: Self-pay

## 2018-04-06 ENCOUNTER — Encounter: Payer: Self-pay | Admitting: Neurology

## 2018-04-06 ENCOUNTER — Other Ambulatory Visit (INDEPENDENT_AMBULATORY_CARE_PROVIDER_SITE_OTHER): Payer: Medicare Other

## 2018-04-06 VITALS — BP 138/60 | HR 70 | Ht 63.0 in | Wt 130.0 lb

## 2018-04-06 DIAGNOSIS — G3184 Mild cognitive impairment, so stated: Secondary | ICD-10-CM | POA: Diagnosis not present

## 2018-04-06 DIAGNOSIS — F419 Anxiety disorder, unspecified: Secondary | ICD-10-CM | POA: Diagnosis not present

## 2018-04-06 DIAGNOSIS — R1032 Left lower quadrant pain: Secondary | ICD-10-CM

## 2018-04-06 DIAGNOSIS — Z8679 Personal history of other diseases of the circulatory system: Secondary | ICD-10-CM

## 2018-04-06 LAB — URINALYSIS, ROUTINE W REFLEX MICROSCOPIC
BILIRUBIN URINE: NEGATIVE
Ketones, ur: NEGATIVE
LEUKOCYTES UA: NEGATIVE
NITRITE: NEGATIVE
Specific Gravity, Urine: <=1.005 — AB (ref 1.000–1.030)
TOTAL PROTEIN, URINE-UPE24: NEGATIVE
Urine Glucose: NEGATIVE
Urobilinogen, UA: 0.2 (ref 0.0–1.0)
pH: 6 (ref 5.0–8.0)

## 2018-04-06 NOTE — Progress Notes (Signed)
NEUROLOGY CONSULTATION NOTE  Nancy [name] MRN: [id number] DOB: April 30, 1946  Referring provider: Dr. Jenna [name] Primary care provider: Dr. Jenna [name]  Reason for consult:  Memory loss  Dear Dr Nancy [name]:  Thank you for your kind referral of Nancy [name] for consultation of the above symptoms. Although her history is well known to you, please allow me to reiterate it for the purpose of our medical record. The patient was accompanied to the clinic by her husbnad who also provides collateral information. Records and images were personally reviewed where available.  HISTORY OF PRESENT ILLNESS: This is a pleasant 72 year old ambidextrous woman with a history of right chronic subdural hematoma s/p craniectomy in 2013, hyperlipidemia, hypertension, CAD, presenting for evaluation of worsening memory. She and her husband reports memory changes started soon after her brain surgery in 2013, but have become more progressive and significantly noticeable in the past year. Soon after surgery, her husband noticed that she would have word-finding difficulties, mixing up [place] and [place]. Over the past year, the word-finding difficulties have worsened, she states she sometimes forgets her grandparents names, when she meant to say grandchildren. She got a shock around 1-1.5 years ago at the doctor's office when she could not remember her address and had to look at her drivers license. She loses things a lot at home, she has 9 pairs of reading glasses and always has a hard time finding them. She does not cook as much and denies leaving the stove on. She continues to manage bills and medications without significant difficulties, she occasionally forgets her medications when they are busy. She stopped driving after heart surgery years ago, she denied getting lost but felt afraid. She is able to bathe and dress independently. Over the past year, she has become quite anxious about minor things, sometimes the  dog barking would make her very nervous. She has found taking [place] helps with this. She has a prescription for Xanax but does not take it regularly. She was noted to be anxious at several points during the visit today.  She denies any regular headaches but has a light one today from running around. She has light dizziness when standing at times. She denies any diplopia, dysarthria/dysphagia, focal numbness/tingling/weakness, both feet are numb. She has low back pain and occasional neck pain. She has constipation. No anosmia, tremors, REM behavior disorder. Her husband denies any paranoia or hallucinations. She does not sleep well, no wandering behavior. Her mother and maternal grandmother had memory issues. Aside from head injury in 2013 (hit her head getting up, leading to subdural hematoma), no other head injuries or falls. She rarely drinks alcohol.  Laboratory Data: Lab Results  Component Value Date   TSH 1.67 11/30/2017   Lab Results  Component Value Date   [id number] 11/30/2017     PAST MEDICAL HISTORY: Past Medical History:  Diagnosis Date  . Anxiety state, unspecified   . CHEST PAIN UNSPECIFIED   . COLONIC POLYPS, HX OF   . CORONARY ATHEROSCLEROSIS NATIVE CORONARY ARTERY   . DYSLIPIDEMIA   . Essential hypertension, benign   . Osteoporosis     PAST SURGICAL HISTORY: Past Surgical History:  Procedure Laterality Date  . CORONARY ANGIOPLASTY WITH STENT PLACEMENT  2010   3 times  . CORONARY ARTERY BYPASS GRAFT  2009  . CRANIOTOMY  04/27/2012   Procedure: CRANIOTOMY HEMATOMA EVACUATION SUBDURAL;  Surgeon: Eustace Moore, MD;  Location: [facility];  Service: Neurosurgery;  Laterality: Right;  Right Frontal Burr hole for Subdural Hematoma  . NASAL SINUS SURGERY    . POLYPECTOMY      MEDICATIONS: Current Outpatient Medications on File Prior to Visit  Medication Sig Dispense Refill  . ALPRAZolam (XANAX) 0.5 MG tablet Take 1 tablet (0.5 mg total) at bedtime as  needed by mouth for anxiety. 30 tablet 0  . aspirin 81 MG tablet Take 81 mg by mouth daily.     . Cholecalciferol (VITAMIN D-3 PO) Take 1 tablet by mouth every other day.     . Coenzyme Q10 (CO Q 10 PO) Take 1 capsule by mouth every other day.    . diphenhydrAMINE (BENADRYL) 25 MG tablet Take 25 mg by mouth at bedtime as needed. For sleep    . diphenhydramine-acetaminophen (TYLENOL PM) 25-500 MG TABS Take 1 tablet by mouth at bedtime as needed. For insomnia/headache    . ezetimibe (ZETIA) 10 MG tablet TAKE 1 TABLET BY MOUTH ONCE DAILY 30 tablet 5  . losartan (COZAAR) 25 MG tablet TAKE 1 TABLET BY MOUTH ONCE DAILY 30 tablet 5  . metoprolol succinate (TOPROL-XL) 25 MG 24 hr tablet TAKE 3 TABLETS BY MOUTH ONCE DAILY 90 tablet 3  . nitroGLYCERIN (NITROSTAT) 0.4 MG SL tablet DISSOLVE ONE TABLET UNDER THE TONGUE EVERY 5 MINUTES AS NEEDED FOR CHEST PAIN.  DO NOT EXCEED A TOTAL OF 3 DOSES IN 15 MINUTES 75 tablet 0  . Omega-3 Fatty Acids (EQL OMEGA 3 FISH OIL) 1400 MG CAPS Take 1 tablet by mouth daily.    . polyethylene glycol (MIRALAX / GLYCOLAX) packet Take 17 g by mouth daily.     . predniSONE (DELTASONE) 20 MG tablet 3 tabs poqday 1-2, 2 tabs poqday 3-4, 1 tab poqday 5-6 12 tablet 0  . rosuvastatin (CRESTOR) 10 MG tablet TAKE 1 TABLET BY MOUTH ONCE DAILY 90 tablet 2  . vitamin E (VITAMIN E) 400 UNIT capsule Take 400 Units by mouth daily.     No current facility-administered medications on file prior to visit.     ALLERGIES: Allergies  Allergen Reactions  . Alendronate Shortness Of Breath and Swelling  . Atorvastatin Other (See Comments)    Muscle cramps  . Bactrim [Sulfamethoxazole-Trimethoprim] Other (See Comments)    Blisters/skin peeling, nausea  . Buprenorphine Hcl Nausea Only  . Morphine Nausea Only    OTHER  . Morphine And Related Nausea Only  . Prednisone Other (See Comments)    Unknown  . Sulfa Antibiotics Itching    Blisters/skin peeling  . Sulfasalazine Itching     Blisters/skin peeling  . Sulfonamide Derivatives Other (See Comments)    Blisters/skin peeling    FAMILY HISTORY: Family History  Problem Relation Age of Onset  . Hypertension Mother   . Cancer Mother        COLON  . Kidney disease Mother   . Heart attack Father   . Hypertension Brother   . Stroke Maternal Grandmother   . Cancer Maternal Grandfather        INTESTINAL     SOCIAL HISTORY: Social History   Socioeconomic History  . Marital status: Married    Spouse name: Not on file  . Number of children: Not on file  . Years of education: Not on file  . Highest education level: Not on file  Occupational History  . Not on file  Social Needs  . Financial resource strain: Not on file  . Food insecurity:    Worry: Not on  file    Inability: Not on file  . Transportation needs:    Medical: Not on file    Non-medical: Not on file  Tobacco Use  . Smoking status: Never Smoker  . Smokeless tobacco: Never Used  Substance and Sexual Activity  . Alcohol use: Yes    Comment: occasional glass wine  . Drug use: No  . Sexual activity: Not Currently    Partners: Male    Comment: 1ST INTERCOURSE- 9, PARTNERS- 1 , MARRIED - 52 YRS   Lifestyle  . Physical activity:    Days per week: Not on file    Minutes per session: Not on file  . Stress: Not on file  Relationships  . Social connections:    Talks on phone: Not on file    Gets together: Not on file    Attends religious service: Not on file    Active member of club or organization: Not on file    Attends meetings of clubs or organizations: Not on file    Relationship status: Not on file  . Intimate partner violence:    Fear of current or ex partner: Not on file    Emotionally abused: Not on file    Physically abused: Not on file    Forced sexual activity: Not on file  Other Topics Concern  . Not on file  Social History Narrative  . Not on file    REVIEW OF SYSTEMS: Constitutional: No fevers, chills, or sweats, no  generalized fatigue, change in appetite Eyes: No visual changes, double vision, eye pain Ear, nose and throat: No hearing loss, ear pain, nasal congestion, sore throat Cardiovascular: No chest pain, palpitations Respiratory:  No shortness of breath at rest or with exertion, wheezes GastrointestinaI: No nausea, vomiting, diarrhea, abdominal pain, fecal incontinence Genitourinary:  No dysuria, urinary retention or frequency Musculoskeletal:  + neck pain, back pain Integumentary: No rash, pruritus, skin lesions Neurological: as above Psychiatric: No depression,+ insomnia, anxiety Endocrine: No palpitations, fatigue, diaphoresis, mood swings, change in appetite, change in weight, increased thirst Hematologic/Lymphatic:  No anemia, purpura, petechiae. Allergic/Immunologic: no itchy/runny eyes, nasal congestion, recent allergic reactions, rashes  PHYSICAL EXAM: Vitals:   04/06/18 1006  BP: 138/60  Pulse: 70  SpO2: 98%   General: No acute distress, became anxious at several points during memory testing Head:  Normocephalic/atraumatic Eyes: Fundoscopic exam shows bilateral sharp discs, no vessel changes, exudates, or hemorrhages Neck: supple, no paraspinal tenderness, full range of motion Back: No paraspinal tenderness Heart: regular rate and rhythm Lungs: Clear to auscultation bilaterally. Vascular: No carotid bruits. Skin/Extremities: No rash, no edema Neurological Exam: Mental status: alert and oriented to person, place, and time, no dysarthria or aphasia, Fund of knowledge is appropriate.  Recent and remote memory are intact.  Attention and concentration are normal.    Able to name objects and repeat phrases.  Montreal Cognitive Assessment  04/06/2018  Visuospatial/ Executive (0/5) 5  Naming (0/3) 3  Attention: Read list of digits (0/2) 2  Attention: Read list of letters (0/1) 1  Attention: Serial 7 subtraction starting at 100 (0/3) 3  Language: Repeat phrase (0/2) 2  Language :  Fluency (0/1) 0  Abstraction (0/2) 2  Delayed Recall (0/5) 1  Orientation (0/6) 6  Total 25   Cranial nerves: CN I: not tested CN II: pupils equal, round and reactive to light, visual fields intact, fundi unremarkable. CN III, IV, VI:  full range of motion, no nystagmus, no ptosis CN V: facial  sensation intact CN VII: upper and lower face symmetric CN VIII: hearing intact to finger rub CN IX, X: gag intact, uvula midline CN XI: sternocleidomastoid and trapezius muscles intact CN XII: tongue midline Bulk & Tone: normal, no fasciculations. Motor: 5/5 throughout with no pronator drift. Sensation: intact to light touch, cold, pin, vibration and joint position sense.  No extinction to double simultaneous stimulation.  Romberg test negative Deep Tendon Reflexes: +2 throughout, no ankle clonus Plantar responses: downgoing bilaterally Cerebellar: no incoordination on finger to nose testing Gait: narrow-based and steady, difficulty with tandem walk Tremor: none  IMPRESSION: This is a pleasant 72 year old ambidextrous woman with a history of right chronic subdural hematoma s/p craniectomy in 2013, hyperlipidemia, hypertension, CAD, presenting for evaluation of worsening memory. Her neurological exam is non-focal, MOCA score today 25/30. Symptoms suggestive of mild cognitive impairment, she does not appear to have any difficulties with complex tasks. We discussed different causes of memory loss. MRI brain without contrast will be ordered to assess for underlying structural abnormality. TSH and B12 normal. She was noted to be anxious at several points during the visit, particularly during memory testing, we discussed how anxiety can also worsen memory. She is agreeable to starting an SSRI, side effects were discussed. We discussed the importance of control of vascular risk factors, physical exercise, and brain stimulation exercises for brain health. She will follow-up in 6 months and knows to call for  any changes.   Thank you for allowing me to participate in the care of this patient. Please do not hesitate to call for any questions or concerns.   Ellouise Newer, M.D.  CC: Dr. Dennard [name]

## 2018-04-06 NOTE — Patient Instructions (Addendum)
1. Schedule open MRI brain without contrast  We have sent a referral for your OPEN MRI to [facility].  They will contact you directly to schedule your appointment.  [facility] is located at [address] [place], [place] [address].  If you need to speak with [facility] for any reason, they can be reached at [phone]  2. Start Lexapro 10mg  once a day 3. Follow-up in 6 months, call for any changes  FALL PRECAUTIONS: Be cautious when walking. Scan the area for obstacles that may increase the risk of trips and falls. When getting up in the mornings, sit up at the edge of the bed for a few minutes before getting out of bed. Consider elevating the bed at the head end to avoid drop of blood pressure when getting up. Walk always in a well-lit room (use night lights in the walls). Avoid area rugs or power cords from appliances in the middle of the walkways. Use a walker or a cane if necessary and consider physical therapy for balance exercise. Get your eyesight checked regularly.  HOME SAFETY: Consider the safety of the kitchen when operating appliances like stoves, microwave oven, and blender. Consider having supervision and share cooking responsibilities until no longer able to participate in those. Accidents with firearms and other hazards in the house should be identified and addressed as well.  ABILITY TO BE LEFT ALONE: If patient is unable to contact 911 operator, consider using LifeLine, or when the need is there, arrange for someone to stay with patients. Smoking is a fire hazard, consider supervision or cessation. Risk of wandering should be assessed by caregiver and if detected at any point, supervision and safe proof recommendations should be instituted.  MEDICATION SUPERVISION: Inability to self-administer medication needs to be constantly addressed. Implement a mechanism to ensure safe administration of the medications.  RECOMMENDATIONS FOR ALL PATIENTS WITH MEMORY PROBLEMS: 1.  Continue to exercise (Recommend 30 minutes of walking everyday, or 3 hours every week) 2. Increase social interactions - continue going to [place] and enjoy social gatherings with friends and family 3. Eat healthy, avoid fried foods and eat more fruits and vegetables 4. Maintain adequate blood pressure, blood sugar, and blood cholesterol level. Reducing the risk of stroke and cardiovascular disease also helps promoting better memory. 5. Avoid stressful situations. Live a simple life and avoid aggravations. Organize your time and prepare for the next day in anticipation. 6. Sleep well, avoid any interruptions of sleep and avoid any distractions in the bedroom that may interfere with adequate sleep quality 7. Avoid sugar, avoid sweets as there is a strong link between excessive sugar intake, diabetes, and cognitive impairment We discussed the Mediterranean diet, which has been shown to help patients reduce the risk of progressive memory disorders and reduces cardiovascular risk. This includes eating fish, eat fruits and green leafy vegetables, nuts like almonds and hazelnuts, walnuts, and also use olive oil. Avoid fast foods and fried foods as much as possible. Avoid sweets and sugar as sugar use has been linked to worsening of memory function.  There is always a concern of gradual progression of memory problems. If this is the case, then we may need to adjust level of care according to patient needs. Support, both to the patient and caregiver, should then be put into place.

## 2018-04-07 LAB — URINE CULTURE
MICRO NUMBER:: 90734127
Result:: NO GROWTH
SPECIMEN QUALITY:: ADEQUATE

## 2018-04-11 ENCOUNTER — Telehealth: Payer: Self-pay | Admitting: Neurology

## 2018-04-11 ENCOUNTER — Encounter: Payer: Self-pay | Admitting: Cardiovascular Disease

## 2018-04-11 ENCOUNTER — Ambulatory Visit (INDEPENDENT_AMBULATORY_CARE_PROVIDER_SITE_OTHER): Payer: Medicare Other | Admitting: Cardiovascular Disease

## 2018-04-11 VITALS — BP 124/52 | HR 64 | Ht 63.0 in | Wt 127.0 lb

## 2018-04-11 DIAGNOSIS — E782 Mixed hyperlipidemia: Secondary | ICD-10-CM | POA: Diagnosis not present

## 2018-04-11 DIAGNOSIS — I2510 Atherosclerotic heart disease of native coronary artery without angina pectoris: Secondary | ICD-10-CM

## 2018-04-11 MED ORDER — ESCITALOPRAM OXALATE 10 MG PO TABS: 10.0000 mg | ORAL_TABLET | Freq: Every day | ORAL | 3 refills | Status: DC

## 2018-04-11 NOTE — Patient Instructions (Signed)
Medication Instructions:  Your physician recommends that you continue on your current medications as directed. Please refer to the Current Medication list given to you today.   Labwork: None Ordered   Testing/Procedures: None Ordered   Follow-Up: Your physician wants you to follow-up in: 6 months with Dr. Nahser.  You will receive a reminder letter in the mail two months in advance. If you don't receive a letter, please call our office to schedule the follow-up appointment.   If you need a refill on your cardiac medications before your next appointment, please call your pharmacy.   Thank you for choosing [facility]! Barrie Wale, RN [phone]    

## 2018-04-11 NOTE — Telephone Encounter (Signed)
Rx sent 

## 2018-04-11 NOTE — Progress Notes (Signed)
Cardiology Office Note   Date:  04/11/2018   ID:  Nancy, [name] 1945/11/22, MRN [id number]  PCP:  Susy Frizzle, MD  Cardiologist:   Mertie Moores, MD   Chief Complaint  Patient presents with  . Coronary Artery Disease   1. Coronary artery disease-status post coronary artery bypass graft- 2008 Status post PCI in 2010. 2. craniotomy for subdural hematoma. 3.  .  hyperlipidemia-    Nancy [name] is doing well from a cardiac standpoint.  She is having lots of leg cramps. Takes Tylenol PM each night for cramps. She's tried taking mustard and this does not help. Her atorvastatin dose has been cut in 1/2 but she continues to have joint pain, muscle ache. She also has numbness in her feet,  Has lots of ankle, knee and hip pain    January 03, 2015:   Nancy [name] is a 72 y.o. female who presents for follow-up of her coronary artery disease.  Sept. 13, 2016:  Doing well.  Normal stress myoview in March , 2016, Lipids looked great  Feeling well.  Not sleeping well.   January 07, 2106:  Has had some palpitations when she lies on her left side to sleep  Has worn the monitor in the past Does not want to necessarily wear it again  Has bilateral hand splints on - arthritis   No CP or dyspnea. Has slowed down with her walking  - walking slower and perhaps not as far.   Sept. 18, 2017  Has occasional irreg HR when she lays down on her left side.   Able to do her normal activities without cardiac problems . No CP or dyspnea . Lots of arthritis symptoms . Has some memory loss following her subdural hematoma  January 04, 2017:  Multiple concerns / complaints today   Has lots of muscle pains  , joint pain , Arthritis  Brief Dizziness when she stands up  Constipation Fatigues easily    No angina , Very rare episodes of chest tightness.  Has stopped some of her OTC meds ( fish oil, Vit B6, )   August 26, 2018:  Nancy [name] is having fatigue  - more  fatigue and shortness of breath with exertion  Very similar to prior to her CABG  Has occasional palpitations   April 11, 2018:   Nancy [name] is seen today for follow up of coronary artery disease. She was having some shortness of breath symptoms were very similar to her previous episodes of angina. Myoview study in November, 2018 was low risk study with no evidence of ischemia.  Echocardiogram was normal.  Has been having some palpitations recently .   Have resolved today Does not have much energy .   Past Medical History:  Diagnosis Date  . Anxiety state, unspecified   . CHEST PAIN UNSPECIFIED   . COLONIC POLYPS, HX OF   . CORONARY ATHEROSCLEROSIS NATIVE CORONARY ARTERY   . DYSLIPIDEMIA   . Essential hypertension, benign   . Osteoporosis     Past Surgical History:  Procedure Laterality Date  . CORONARY ANGIOPLASTY WITH STENT PLACEMENT  2010   3 times  . CORONARY ARTERY BYPASS GRAFT  2009  . CRANIOTOMY  04/27/2012   Procedure: CRANIOTOMY HEMATOMA EVACUATION SUBDURAL;  Surgeon: Eustace Moore, MD;  Location: [facility];  Service: Neurosurgery;  Laterality: Right;  Right Frontal Burr hole for Subdural Hematoma  . NASAL SINUS SURGERY    . POLYPECTOMY  Current Outpatient Medications  Medication Sig Dispense Refill  . ALPRAZolam (XANAX) 0.5 MG tablet Take 1 tablet (0.5 mg total) at bedtime as needed by mouth for anxiety. 30 tablet 0  . aspirin 81 MG tablet Take 81 mg by mouth daily.     . Cholecalciferol (VITAMIN D-3 PO) Take 1 tablet by mouth every other day.     . Coenzyme Q10 (CO Q 10 PO) Take 1 capsule by mouth every other day.    . diphenhydrAMINE (BENADRYL) 25 MG tablet Take 25 mg by mouth at bedtime as needed. For sleep    . diphenhydramine-acetaminophen (TYLENOL PM) 25-500 MG TABS Take 1 tablet by mouth at bedtime as needed. For insomnia/headache    . escitalopram (LEXAPRO) 10 MG tablet Take 1 tablet (10 mg total) by mouth daily. 90 tablet 3  . ezetimibe (ZETIA) 10  MG tablet TAKE 1 TABLET BY MOUTH ONCE DAILY 30 tablet 5  . losartan (COZAAR) 25 MG tablet TAKE 1 TABLET BY MOUTH ONCE DAILY 30 tablet 5  . metoprolol succinate (TOPROL-XL) 25 MG 24 hr tablet TAKE 3 TABLETS BY MOUTH ONCE DAILY 90 tablet 3  . nitroGLYCERIN (NITROSTAT) 0.4 MG SL tablet DISSOLVE ONE TABLET UNDER THE TONGUE EVERY 5 MINUTES AS NEEDED FOR CHEST PAIN.  DO NOT EXCEED A TOTAL OF 3 DOSES IN 15 MINUTES 75 tablet 0  . Omega-3 Fatty Acids (EQL OMEGA 3 FISH OIL) 1400 MG CAPS Take 1 tablet by mouth daily.    . polyethylene glycol (MIRALAX / GLYCOLAX) packet Take 17 g by mouth daily.     . rosuvastatin (CRESTOR) 10 MG tablet TAKE 1 TABLET BY MOUTH ONCE DAILY 90 tablet 2  . vitamin E (VITAMIN E) 400 UNIT capsule Take 400 Units by mouth daily.     No current facility-administered medications for this visit.     Allergies:   Alendronate; Atorvastatin; Bactrim [sulfamethoxazole-trimethoprim]; Buprenorphine hcl; Morphine; Morphine and related; Prednisone; Sulfa antibiotics; Sulfasalazine; and Sulfonamide derivatives    Social History:  The patient  reports that she has never smoked. She has never used smokeless tobacco. She reports that she drinks alcohol. She reports that she does not use drugs.   Family History:  The patient's family history includes Cancer in her maternal grandfather and mother; Heart attack in her father; Hypertension in her brother and mother; Kidney disease in her mother; Stroke in her maternal grandmother.    ROS:  Please see the history of present illness.  Reviewed and current history.  Otherwise negative.  Physical Exam: Blood pressure (!) 124/52, pulse 64, height 5\' 3"  (1.6 m), weight 127 lb (57.6 kg), SpO2 99 %.  GEN:  Well nourished, well developed in no acute distress HEENT: Normal NECK: No JVD; No carotid bruits LYMPHATICS: No lymphadenopathy CARDIAC: RR, no murmurs, rubs, gallops RESPIRATORY:  Clear to auscultation without rales, wheezing or rhonchi    ABDOMEN: Soft, non-tender, non-distended MUSCULOSKELETAL:  No edema; No deformity  SKIN: Warm and dry NEUROLOGIC:  Alert and oriented x 3  EKG:    Recent Labs: 11/30/2017: TSH 1.67 03/21/2018: ALT 18; BUN 13; Creatinine, Ser 0.99; Hemoglobin 13.9; Platelets 219.0; Potassium 4.5; Sodium 144    Lipid Panel    Component Value Date/Time   CHOL 120 08/26/2017 1627   TRIG 91 08/26/2017 1627   HDL 51 08/26/2017 1627   CHOLHDL 2.4 08/26/2017 1627   CHOLHDL 2.0 06/23/2016 0814   VLDL 14 06/23/2016 0814   LDLCALC 51 08/26/2017 1627  Wt Readings from Last 3 Encounters:  04/11/18 127 lb (57.6 kg)  04/06/18 130 lb (59 kg)  03/21/18 129 lb 6.4 oz (58.7 kg)     Other studies Reviewed: Additional studies/ records that were reviewed today include: . Review of the above records demonstrates:    ASSESSMENT AND PLAN:  1. Coronary artery disease-status post coronary artery bypass graft- 2008 Status post PCI in 2010.      2. craniotomy for subdural hematoma.  3.   hyperlipidemia-     Last lipid levels look great   4.  Fatigue:   Has profound fatigue.   Has palpitations  Has hot flashes associated with sweats.    Doesn't sleep well but does not think she has OSA. Took a tylenol PM and slept better.  I recommended that she continue with the Tylenol PM.  She will check back in with her medical doctor.   Labs/ tests ordered today include:  No orders of the defined types were placed in this encounter.   Disposition:   FU with me in 6 months with fasting labs.    Signed, Mertie Moores, MD  04/11/2018 4:02 PM    [facility] [address], [place], [place]  [address] Phone: [phone]; Fax: [phone]

## 2018-04-15 ENCOUNTER — Telehealth: Payer: Self-pay | Admitting: Neurology

## 2018-04-15 NOTE — Telephone Encounter (Signed)
Patient called needing to see about having her MRI rescheduled? She said she is very Claustrophobic and was unable to have it done. Please call. Thanks

## 2018-04-19 ENCOUNTER — Telehealth: Payer: Self-pay | Admitting: Neurology

## 2018-04-19 NOTE — Telephone Encounter (Signed)
Patient wants to talk to someone about getting a MRI please call patient she has called three times and no one has called her back    cb [phone]

## 2018-04-20 ENCOUNTER — Other Ambulatory Visit: Payer: Self-pay

## 2018-04-20 DIAGNOSIS — G3184 Mild cognitive impairment, so stated: Secondary | ICD-10-CM

## 2018-04-20 DIAGNOSIS — F419 Anxiety disorder, unspecified: Secondary | ICD-10-CM

## 2018-04-20 DIAGNOSIS — Z8679 Personal history of other diseases of the circulatory system: Secondary | ICD-10-CM

## 2018-04-22 ENCOUNTER — Other Ambulatory Visit: Payer: Self-pay

## 2018-04-22 DIAGNOSIS — Z8679 Personal history of other diseases of the circulatory system: Secondary | ICD-10-CM

## 2018-04-22 DIAGNOSIS — G3184 Mild cognitive impairment, so stated: Secondary | ICD-10-CM

## 2018-04-22 NOTE — Telephone Encounter (Signed)
Patient states that she has called 4 or 5 times about getting the order for the MRI to be sent so she can have the MRI. She states that no one has returned her calls. Please call patient or she states that she is coming by today after lunch to speak to you about this

## 2018-04-22 NOTE — Telephone Encounter (Signed)
LMOVM with centralized scheduling to schedule pt's MRI.

## 2018-04-22 NOTE — Telephone Encounter (Signed)
Scheduled pt to come in on August 6th for physical.  [facility] requires H&P <30 days prior to MRI

## 2018-04-22 NOTE — Telephone Encounter (Signed)
Pt stopped by the office and I was able to relay both appointments to her.

## 2018-04-22 NOTE — Telephone Encounter (Signed)
Spoke with pt.  Let her know that I have attempted to call scheduling.  Will call pt back as soon as I have MRI scheduled.  Pt states that any day works for her except the 16th of July.

## 2018-04-22 NOTE — Telephone Encounter (Signed)
Received return call from [name] with centralized scheduling.  Pt scheduled for MRI under general anesthesia on May 26, 2018 @ 10am - arrive at 8am to [facility] admitting.  NPO midnight.  Nancy [name] will fax H&P

## 2018-04-22 NOTE — Telephone Encounter (Signed)
Called pt to relay both appointments below.  No answer.  LMOM asking pt to return my call.

## 2018-05-12 ENCOUNTER — Telehealth: Payer: Self-pay | Admitting: Neurology

## 2018-05-12 NOTE — Telephone Encounter (Signed)
Patient wants to have the MRI done  at [facility]. They have a large machine and 10 ppl can fit in it. Please move the MRI to [facility] they would like a call to let them know we sent the order

## 2018-05-12 NOTE — Telephone Encounter (Signed)
Patient called back and states that they want to go back to [facility] to have the MRI

## 2018-05-12 NOTE — Telephone Encounter (Signed)
Family is calling [facility] directly to schedule.

## 2018-05-13 ENCOUNTER — Other Ambulatory Visit: Payer: Self-pay | Admitting: Neurology

## 2018-05-13 MED ORDER — DIAZEPAM 5 MG PO TABS: ORAL_TABLET | ORAL | 0 refills | Status: DC

## 2018-05-13 NOTE — Telephone Encounter (Signed)
Patient's husband called back and said that they do have the MRI scheduled at [facility] again but they are needing a Valium Prescription called in at [facility] on [facility] Thank you

## 2018-05-13 NOTE — Telephone Encounter (Signed)
I was not aware she wanted to try outpatient MRI first, which I would definitely agree with trying first before going to the one under anesthesia. Ok to give Valium 5mg  x 1 prior to MRI, may give second dose if needed. Thanks!

## 2018-05-13 NOTE — Telephone Encounter (Signed)
Patient's husband made aware.  

## 2018-05-13 NOTE — Telephone Encounter (Signed)
Done, pls let them know let's try this first, hopefully we won't need to do the one under sedation. Thanks

## 2018-05-13 NOTE — Telephone Encounter (Signed)
RX entered.  Dr. Delice Lesch - can you sign?

## 2018-05-13 NOTE — Telephone Encounter (Signed)
Do you know anything about this? Looks like they are scheduled at [facility] for MR, but also scheduled for H&P for MR with anesthesia after the scheduled appt?

## 2018-05-18 ENCOUNTER — Other Ambulatory Visit: Payer: Self-pay | Admitting: Family Medicine

## 2018-05-18 ENCOUNTER — Ambulatory Visit (HOSPITAL_COMMUNITY)
Admission: RE | Admit: 2018-05-18 | Discharge: 2018-05-18 | Disposition: A | Discharge status: Home / Self Care | Payer: Medicare Other | Source: Ambulatory Visit | Attending: Neurology | Admitting: Neurology

## 2018-05-18 DIAGNOSIS — R410 Disorientation, unspecified: Secondary | ICD-10-CM | POA: Diagnosis not present

## 2018-05-18 DIAGNOSIS — Z8679 Personal history of other diseases of the circulatory system: Secondary | ICD-10-CM | POA: Diagnosis not present

## 2018-05-18 DIAGNOSIS — G3184 Mild cognitive impairment, so stated: Secondary | ICD-10-CM | POA: Insufficient documentation

## 2018-05-18 LAB — CREATININE, SERUM
CREATININE: 0.93 mg/dL (ref 0.44–1.00)
GFR calc Af Amer: >60 mL/min (ref >60)
GFR calc non Af Amer: 60 mL/min — ABNORMAL LOW (ref >60)

## 2018-05-18 MED ORDER — GADOBENATE DIMEGLUMINE 529 MG/ML IV SOLN
10.0000 mL | Freq: Once | INTRAVENOUS | Status: AC
  Administered 2018-05-18: 10 mL via INTRAVENOUS

## 2018-05-19 ENCOUNTER — Telehealth: Payer: Self-pay

## 2018-05-19 NOTE — Telephone Encounter (Signed)
-----   Message from Cameron Sprang, MD sent at 05/19/2018 12:01 PM EDT ----- Pls let patient know MRI brain looks good, no evidence of tumor, stroke, or bleed. It shows age-related changes. Thanks

## 2018-05-19 NOTE — Telephone Encounter (Signed)
LMOM asking for return call to relay message below.  

## 2018-05-20 ENCOUNTER — Telehealth: Payer: Self-pay

## 2018-05-20 NOTE — Telephone Encounter (Signed)
Spoke with pt.  Advised that her appointment for August 6 was unneccessary since MRI was already preformed.  Appointment cancelled.  Follow up already scheduled for January 2020

## 2018-05-20 NOTE — Telephone Encounter (Signed)
Spoke with pt relaying MRI results.

## 2018-05-24 ENCOUNTER — Ambulatory Visit: Payer: Medicare Other | Admitting: Neurology

## 2018-05-26 ENCOUNTER — Ambulatory Visit: Admit: 2018-05-26 | Payer: Medicare Other

## 2018-05-26 ENCOUNTER — Ambulatory Visit (HOSPITAL_COMMUNITY): Payer: Medicare Other

## 2018-05-26 SURGERY — MRI WITH ANESTHESIA
Anesthesia: General

## 2018-06-21 ENCOUNTER — Other Ambulatory Visit: Payer: Self-pay | Admitting: Cardiovascular Disease

## 2018-06-24 ENCOUNTER — Other Ambulatory Visit: Payer: Self-pay | Admitting: Cardiovascular Disease

## 2018-06-24 NOTE — Telephone Encounter (Signed)
Outpatient Medication Detail    Disp Refills Start End   nitroGLYCERIN (NITROSTAT) 0.4 MG SL tablet 75 tablet 2 06/21/2018    Sig: DISSOLVE ONE TABLET UNDER THE TONGUE EVERY 5 MINUTES AS NEEDED FOR CHEST PAIN. DO NOT EXCEED A TOTAL OF 3 DOSES IN 15 MINUTES   Sent to pharmacy as: nitroGLYCERIN (NITROSTAT) 0.4 MG SL tablet   Notes to Pharmacy: Please consider 90 day supplies to promote better adherence   E-Prescribing Status: Receipt confirmed by pharmacy (06/21/2018 8:17 AM EDT)   Pharmacy   [place], [place] - [address]

## 2018-07-07 ENCOUNTER — Ambulatory Visit (INDEPENDENT_AMBULATORY_CARE_PROVIDER_SITE_OTHER): Payer: Medicare Other

## 2018-07-07 DIAGNOSIS — Z23 Encounter for immunization: Secondary | ICD-10-CM

## 2018-07-07 NOTE — Progress Notes (Signed)
Patient came in today for annual influenza vaccine. Vaccine given in the left deltoid. Patient tolerated well. VIS given to patient.

## 2018-08-02 ENCOUNTER — Other Ambulatory Visit: Payer: Self-pay

## 2018-08-02 ENCOUNTER — Ambulatory Visit (INDEPENDENT_AMBULATORY_CARE_PROVIDER_SITE_OTHER): Payer: Medicare Other | Admitting: Family Medicine

## 2018-08-02 ENCOUNTER — Encounter: Payer: Self-pay | Admitting: Family Medicine

## 2018-08-02 VITALS — BP 124/68 | HR 62 | Temp 97.8°F | Resp 14 | Ht 63.0 in | Wt 127.0 lb

## 2018-08-02 DIAGNOSIS — N390 Urinary tract infection, site not specified: Secondary | ICD-10-CM | POA: Diagnosis not present

## 2018-08-02 DIAGNOSIS — R319 Hematuria, unspecified: Secondary | ICD-10-CM

## 2018-08-02 DIAGNOSIS — I2510 Atherosclerotic heart disease of native coronary artery without angina pectoris: Secondary | ICD-10-CM

## 2018-08-02 DIAGNOSIS — R300 Dysuria: Secondary | ICD-10-CM

## 2018-08-02 LAB — URINALYSIS, ROUTINE W REFLEX MICROSCOPIC
Bilirubin Urine: NEGATIVE
Ketones, ur: NEGATIVE
Nitrite: POSITIVE — AB
PH: 6 (ref 5.0–8.0)
SPECIFIC GRAVITY, URINE: 1.01 (ref 1.001–1.03)
WBC, UA: >=60 /HPF — AB (ref 0–5)

## 2018-08-02 LAB — MICROSCOPIC MESSAGE

## 2018-08-02 MED ORDER — CEPHALEXIN 500 MG PO CAPS: 500.0000 mg | ORAL_CAPSULE | Freq: Four times a day (QID) | ORAL | 0 refills | Status: AC

## 2018-08-02 MED ORDER — CEFTRIAXONE SODIUM 1 G IJ SOLR
1.0000 g | Freq: Once | INTRAMUSCULAR | Status: AC
  Administered 2018-08-02: 1 g via INTRAMUSCULAR

## 2018-08-02 NOTE — Patient Instructions (Addendum)
You have urinary tract infection. Take the antibiotics.    Follow up with [place] if not improving in 2-3 days.  If you or any family members notice any confusion, lethargy, balance issues, nausea, vomiting - you need to go to the hospital

## 2018-08-02 NOTE — Progress Notes (Signed)
Patient ID: Nancy [name], female    DOB: 06-26-1946, 72 y.o.   MRN: [id number]  PCP: Susy Frizzle, MD  Chief Complaint  Patient presents with  . Dysuria    x2 weeks- burning with urination, lower abd pressure- has been using cranberry juice to relieve sx    Subjective:   Nancy [name] is a 72 y.o. female, presents to clinic with CC of dysuria  Dysuria   This is a new problem. The current episode started 1 to 4 weeks ago (2 weeks). The problem occurs every urination. The problem has been gradually worsening. The quality of the pain is described as burning. The pain is moderate. There has been no fever. She is sexually active (monogomous with husband for decades). There is no history of pyelonephritis. Associated symptoms include flank pain and urgency. Pertinent negatives include no chills, discharge, frequency, hematuria, hesitancy, nausea, possible pregnancy, sweats or vomiting. Treatments tried: cranberry  The treatment provided moderate relief. There is no history of catheterization, kidney stones, recurrent UTIs, a single kidney or urinary stasis.     Patient Active Problem List   Diagnosis Date Noted  . Osteoporosis   . Subdural hematoma ([place]) 07/28/2012  . ANXIETY STATE, UNSPECIFIED 12/24/2009  . CHEST PAIN UNSPECIFIED 08/27/2009  . Hyperlipidemia 08/01/2009  . ESSENTIAL HYPERTENSION, BENIGN 08/01/2009  . Coronary artery disease involving native coronary artery of native heart without angina pectoris 08/01/2009  . CONSTIPATION 06/27/2008  . COLONIC POLYPS, HX OF 06/27/2008     Prior to Admission medications   Medication Sig Start Date End Date Taking? Authorizing Provider  ALPRAZolam Duanne Moron) 0.5 MG tablet Take 1 tablet (0.5 mg total) at bedtime as needed by mouth for anxiety. 08/31/17  Yes Susy Frizzle, MD  aspirin 81 MG tablet Take 81 mg by mouth daily.    Yes [provider]  Cholecalciferol (VITAMIN D-3 PO) Take 1 tablet by mouth every other  day.    Yes [provider]  Coenzyme Q10 (CO Q 10 PO) Take 1 capsule by mouth every other day.   Yes [provider]  diazepam (VALIUM) 5 MG tablet Take one tablet 30 minutes prior to MR (on arrival to facility) and one more just prior to MR (if needed) 05/13/18  Yes Cameron Sprang, MD  diphenhydrAMINE (BENADRYL) 25 MG tablet Take 25 mg by mouth at bedtime as needed. For sleep   Yes [provider]  diphenhydramine-acetaminophen (TYLENOL PM) 25-500 MG TABS Take 1 tablet by mouth at bedtime as needed. For insomnia/headache   Yes [provider]  escitalopram (LEXAPRO) 10 MG tablet Take 1 tablet (10 mg total) by mouth daily. 04/11/18  Yes Cameron Sprang, MD  ezetimibe (ZETIA) 10 MG tablet TAKE 1 TABLET BY MOUTH ONCE DAILY 03/15/18  Yes Nahser, Wonda Cheng, MD  losartan (COZAAR) 25 MG tablet TAKE 1 TABLET BY MOUTH ONCE DAILY 02/16/18  Yes Nahser, Wonda Cheng, MD  metoprolol succinate (TOPROL-XL) 25 MG 24 hr tablet TAKE 3 TABLETS BY MOUTH ONCE DAILY 05/18/18  Yes Susy Frizzle, MD  nitroGLYCERIN (NITROSTAT) 0.4 MG SL tablet DISSOLVE ONE TABLET UNDER THE TONGUE EVERY 5 MINUTES AS NEEDED FOR CHEST PAIN.  DO NOT EXCEED A TOTAL OF 3 DOSES IN 15 MINUTES 06/21/18  Yes Nahser, Wonda Cheng, MD  Omega-3 Fatty Acids (EQL OMEGA 3 FISH OIL) 1400 MG CAPS Take 1 tablet by mouth daily.   Yes [provider]  polyethylene glycol (MIRALAX / GLYCOLAX)  packet Take 17 g by mouth daily.    Yes [provider]  rosuvastatin (CRESTOR) 10 MG tablet TAKE 1 TABLET BY MOUTH ONCE DAILY 01/17/18  Yes Nahser, Wonda Cheng, MD  vitamin E (VITAMIN E) 400 UNIT capsule Take 400 Units by mouth daily.   Yes [provider]     Allergies  Allergen Reactions  . Alendronate Shortness Of Breath and Swelling  . Atorvastatin Other (See Comments)    Muscle cramps  . Bactrim [Sulfamethoxazole-Trimethoprim] Other (See Comments)    Blisters/skin peeling, nausea  . Buprenorphine Hcl Nausea Only    . Morphine Nausea Only    OTHER  . Morphine And Related Nausea Only  . Prednisone Other (See Comments)    Unknown  . Sulfa Antibiotics Itching    Blisters/skin peeling  . Sulfasalazine Itching    Blisters/skin peeling  . Sulfonamide Derivatives Other (See Comments)    Blisters/skin peeling     Family History  Problem Relation Age of Onset  . Hypertension Mother   . Cancer Mother        COLON  . Kidney disease Mother   . Heart attack Father   . Hypertension Brother   . Stroke Maternal Grandmother   . Cancer Maternal Grandfather        INTESTINAL     Review of Systems  Constitutional: Positive for appetite change. Negative for activity change, chills, diaphoresis, fatigue, fever and unexpected weight change.  HENT: Negative.   Eyes: Negative.   Respiratory: Negative.   Cardiovascular: Negative.   Gastrointestinal: Negative.  Negative for abdominal distention, abdominal pain, anal bleeding, blood in stool, constipation, diarrhea, nausea and vomiting.  Endocrine: Negative.   Genitourinary: Positive for dysuria, flank pain and urgency. Negative for frequency, hematuria and hesitancy.  Musculoskeletal: Positive for back pain. Negative for arthralgias, gait problem, joint swelling, myalgias, neck pain and neck stiffness.  Skin: Negative.  Negative for color change.  Allergic/Immunologic: Negative.   Neurological: Negative.  Negative for dizziness, tremors, syncope, weakness, light-headedness and headaches.  Hematological: Negative.   Psychiatric/Behavioral: Negative.  Negative for confusion.  All other systems reviewed and are negative.      Objective:    Vitals:   08/02/18 0848  BP: 124/68  Pulse: 62  Resp: 14  Temp: 97.8 F (36.6 C)  TempSrc: Oral  SpO2: 100%  Weight: 127 lb (57.6 kg)  Height: 5\' 3"  (1.6 m)      Physical Exam  Constitutional: She appears well-developed and well-nourished. No distress.  HENT:  Head: Normocephalic and atraumatic.  Nose:  Nose normal.  Mouth/Throat: Oropharynx is clear and moist.  Eyes: Pupils are equal, round, and reactive to light. Conjunctivae are normal. Right eye exhibits no discharge. Left eye exhibits no discharge.  Neck: Normal range of motion. No tracheal deviation present.  Cardiovascular: Normal rate, regular rhythm, normal heart sounds and intact distal pulses. Exam reveals no gallop and no friction rub.  No murmur heard. Pulmonary/Chest: Effort normal and breath sounds normal. No stridor. No respiratory distress. She has no wheezes. She has no rales.  Abdominal: Soft. Bowel sounds are normal. She exhibits no distension and no mass. There is no tenderness. There is no rebound and no guarding.  No CVA tenderness bilaterally  Musculoskeletal: Normal range of motion.  Neurological: She is alert. She exhibits normal muscle tone. Coordination normal.  Skin: Skin is warm and dry. No rash noted. She is not diaphoretic.  Psychiatric: She has a normal mood and affect. Her behavior  is normal.  Nursing note and vitals reviewed.    Results for orders placed or performed in visit on 08/02/18  Urinalysis, Routine w reflex microscopic  Result Value Ref Range   Color, Urine YELLOW YELLOW   APPearance CLOUDY (A) CLEAR   Specific Gravity, Urine 1.010 1.001 - 1.03   pH 6.0 5.0 - 8.0   Glucose, UA TRACE (A) NEGATIVE   Bilirubin Urine NEGATIVE NEGATIVE   Ketones, ur NEGATIVE NEGATIVE   Hgb urine dipstick 2+ (A) NEGATIVE   Protein, ur 1+ (A) NEGATIVE   Nitrite POSITIVE (A) NEGATIVE   Leukocytes, UA 3+ (A) NEGATIVE   WBC, UA > OR = 60 (A) 0 - 5 /HPF   RBC / HPF 3-10 (A) 0 - 2 /HPF   Squamous Epithelial / LPF 0-5 < OR = 5 /HPF   Bacteria, UA MODERATE (A) NONE SEEN /HPF  Microscopic Message  Result Value Ref Range   Note          Assessment & Plan:      ICD-10-CM   1. Urinary tract infection with hematuria, site unspecified N39.0 Urinalysis, Routine w reflex microscopic   R31.9 Urine Culture     cefTRIAXone (ROCEPHIN) injection 1 g    cephALEXin (KEFLEX) 500 MG capsule    Microscopic Message    72 year old female with 2 weeks of dysuria, urinalysis in office is still with UTI, given her age, length of symptoms and other comorbidities and UA results with pyuria, have treated more aggressively.  Her VSS and she is well appearing w/o any endorsement of associated fever, nausea or vomiting.  She is here without family members, so I have printed out her discharge ductions to include precautions that if any family members notice any change in behavior, cognition, balance, energy level that she needs to be reevaluated.  Covering with Keflex, urine culture pending.   Delsa Grana, PA-C 08/02/18 1:41 PM

## 2018-08-04 LAB — URINE CULTURE
MICRO NUMBER:: 91238334
SPECIMEN QUALITY:: ADEQUATE

## 2018-08-08 DIAGNOSIS — H2513 Age-related nuclear cataract, bilateral: Secondary | ICD-10-CM | POA: Diagnosis not present

## 2018-08-08 DIAGNOSIS — H04123 Dry eye syndrome of bilateral lacrimal glands: Secondary | ICD-10-CM | POA: Diagnosis not present

## 2018-08-08 DIAGNOSIS — H4322 Crystalline deposits in vitreous body, left eye: Secondary | ICD-10-CM | POA: Diagnosis not present

## 2018-08-08 DIAGNOSIS — H01025 Squamous blepharitis left lower eyelid: Secondary | ICD-10-CM | POA: Diagnosis not present

## 2018-08-08 DIAGNOSIS — H01024 Squamous blepharitis left upper eyelid: Secondary | ICD-10-CM | POA: Diagnosis not present

## 2018-08-08 DIAGNOSIS — H3562 Retinal hemorrhage, left eye: Secondary | ICD-10-CM | POA: Diagnosis not present

## 2018-08-08 DIAGNOSIS — H43392 Other vitreous opacities, left eye: Secondary | ICD-10-CM | POA: Diagnosis not present

## 2018-08-08 DIAGNOSIS — H01021 Squamous blepharitis right upper eyelid: Secondary | ICD-10-CM | POA: Diagnosis not present

## 2018-08-08 DIAGNOSIS — H01022 Squamous blepharitis right lower eyelid: Secondary | ICD-10-CM | POA: Diagnosis not present

## 2018-08-17 ENCOUNTER — Other Ambulatory Visit: Payer: Self-pay | Admitting: Cardiovascular Disease

## 2018-08-24 ENCOUNTER — Ambulatory Visit: Payer: Medicare Other

## 2018-09-20 ENCOUNTER — Other Ambulatory Visit: Payer: Self-pay | Admitting: Family Medicine

## 2018-10-20 ENCOUNTER — Other Ambulatory Visit: Payer: Self-pay | Admitting: Cardiovascular Disease

## 2018-10-26 ENCOUNTER — Telehealth: Payer: Self-pay | Admitting: Family Medicine

## 2018-10-26 NOTE — Telephone Encounter (Signed)
FYI -  Pt no longer wants to have prolia injs. She would rather take by mouth medication for her condition. I have removed her from the prolia book and archived her in the prolia system in case she decided later on to start therapy again.

## 2018-10-26 NOTE — Telephone Encounter (Signed)
MD please advise

## 2018-10-27 NOTE — Telephone Encounter (Signed)
Call placed to patient and patient made aware.   States that she will talk to MD on 11/03/2018.

## 2018-10-27 NOTE — Telephone Encounter (Signed)
She has an allergy to fosamax.  Only other option would be evista. I'd be glad to see her to discuss.

## 2018-11-03 ENCOUNTER — Encounter: Payer: Self-pay | Admitting: Family Medicine

## 2018-11-03 ENCOUNTER — Ambulatory Visit (INDEPENDENT_AMBULATORY_CARE_PROVIDER_SITE_OTHER): Payer: Medicare Other | Admitting: Family Medicine

## 2018-11-03 VITALS — BP 100/58 | HR 60 | Temp 97.6°F | Resp 16 | Ht 65.0 in | Wt 121.0 lb

## 2018-11-03 DIAGNOSIS — F5101 Primary insomnia: Secondary | ICD-10-CM | POA: Diagnosis not present

## 2018-11-03 MED ORDER — TRAZODONE HCL 50 MG PO TABS: 25.0000 mg | ORAL_TABLET | Freq: Every evening | ORAL | 3 refills | Status: DC | PRN

## 2018-11-03 NOTE — Progress Notes (Signed)
Subjective:    Patient ID: Nancy [name], female    DOB: 1946/03/30, 73 y.o.   MRN: [id number]  HPI  Patient lost her husband December 1 due to lung cancer.  Since that time she had a difficult time sleeping.  She states that she goes to bed at 8:00 in the evening.  She will toss and turn for 2 hours.  She will then watch TV in the background until she finally drifts off usually 2 or 3 in the morning.  She will wake up 1 or 2 hours later and then she is unable to sleep.  This leaves her feeling extremely tired throughout the day.  She does have some mild age-related memory problems.  These have not worsened.  She denies depression although she is obviously grieving.  She denies any suicidal ideation.  She is interested in something that may help her fall asleep there would be non-habit-forming and hopefully also not exacerbate her mild underlying memory problems. Past Medical History:  Diagnosis Date  . Anxiety state, unspecified   . CHEST PAIN UNSPECIFIED   . COLONIC POLYPS, HX OF   . CORONARY ATHEROSCLEROSIS NATIVE CORONARY ARTERY   . DYSLIPIDEMIA   . Essential hypertension, benign   . Osteoporosis    Past Surgical History:  Procedure Laterality Date  . CORONARY ANGIOPLASTY WITH STENT PLACEMENT  2010   3 times  . CORONARY ARTERY BYPASS GRAFT  2009  . CRANIOTOMY  04/27/2012   Procedure: CRANIOTOMY HEMATOMA EVACUATION SUBDURAL;  Surgeon: Eustace Moore, MD;  Location: [facility];  Service: Neurosurgery;  Laterality: Right;  Right Frontal Burr hole for Subdural Hematoma  . NASAL SINUS SURGERY    . POLYPECTOMY     Current Outpatient Medications on File Prior to Visit  Medication Sig Dispense Refill  . aspirin 81 MG tablet Take 81 mg by mouth daily.     . Cholecalciferol (VITAMIN D-3 PO) Take 1 tablet by mouth every other day.     . Coenzyme Q10 (CO Q 10 PO) Take 1 capsule by mouth every other day.    . diphenhydrAMINE (BENADRYL) 25 MG tablet Take 25 mg by mouth at bedtime as needed.  For sleep    . diphenhydramine-acetaminophen (TYLENOL PM) 25-500 MG TABS Take 1 tablet by mouth at bedtime as needed. For insomnia/headache    . escitalopram (LEXAPRO) 10 MG tablet Take 1 tablet (10 mg total) by mouth daily. 90 tablet 3  . ezetimibe (ZETIA) 10 MG tablet TAKE 1 TABLET BY MOUTH ONCE DAILY 30 tablet 6  . losartan (COZAAR) 25 MG tablet TAKE 1 TABLET BY MOUTH ONCE DAILY 90 tablet 1  . metoprolol succinate (TOPROL-XL) 25 MG 24 hr tablet TAKE 3 TABLETS BY MOUTH ONCE DAILY 90 tablet 3  . nitroGLYCERIN (NITROSTAT) 0.4 MG SL tablet DISSOLVE ONE TABLET UNDER THE TONGUE EVERY 5 MINUTES AS NEEDED FOR CHEST PAIN.  DO NOT EXCEED A TOTAL OF 3 DOSES IN 15 MINUTES 75 tablet 2  . Omega-3 Fatty Acids (EQL OMEGA 3 FISH OIL) 1400 MG CAPS Take 1 tablet by mouth daily.    . polyethylene glycol (MIRALAX / GLYCOLAX) packet Take 17 g by mouth daily.     . rosuvastatin (CRESTOR) 10 MG tablet TAKE 1 TABLET BY MOUTH ONCE DAILY 90 tablet 2  . vitamin E (VITAMIN E) 400 UNIT capsule Take 400 Units by mouth daily.     No current facility-administered medications on file prior to visit.    Allergies  Allergen Reactions  . Alendronate Shortness Of Breath and Swelling  . Atorvastatin Other (See Comments)    Muscle cramps  . Bactrim [Sulfamethoxazole-Trimethoprim] Other (See Comments)    Blisters/skin peeling, nausea  . Buprenorphine Hcl Nausea Only  . Morphine Nausea Only    OTHER  . Morphine And Related Nausea Only  . Prednisone Other (See Comments)    Unknown  . Sulfa Antibiotics Itching    Blisters/skin peeling  . Sulfasalazine Itching    Blisters/skin peeling  . Sulfonamide Derivatives Other (See Comments)    Blisters/skin peeling   Social History   Socioeconomic History  . Marital status: Married    Spouse name: Not on file  . Number of children: Not on file  . Years of education: Not on file  . Highest education level: Not on file  Occupational History  . Not on file  Social Needs  .  Financial resource strain: Not on file  . Food insecurity:    Worry: Not on file    Inability: Not on file  . Transportation needs:    Medical: Not on file    Non-medical: Not on file  Tobacco Use  . Smoking status: Never Smoker  . Smokeless tobacco: Never Used  Substance and Sexual Activity  . Alcohol use: Yes    Comment: occasional glass wine  . Drug use: No  . Sexual activity: Not Currently    Partners: Male    Comment: 1ST INTERCOURSE- 85, PARTNERS- 1 , MARRIED - 47 YRS   Lifestyle  . Physical activity:    Days per week: Not on file    Minutes per session: Not on file  . Stress: Not on file  Relationships  . Social connections:    Talks on phone: Not on file    Gets together: Not on file    Attends religious service: Not on file    Active member of club or organization: Not on file    Attends meetings of clubs or organizations: Not on file    Relationship status: Not on file  . Intimate partner violence:    Fear of current or ex partner: Not on file    Emotionally abused: Not on file    Physically abused: Not on file    Forced sexual activity: Not on file  Other Topics Concern  . Not on file  Social History Narrative   Pt lives in 2 story home with her husband, Nancy [name]   Has 2 adult daughters, 2 granddaughters   12th grade education   Retired Charity fundraiser      Review of Systems  All other systems reviewed and are negative.      Objective:   Physical Exam Vitals signs reviewed.  Constitutional:      General: She is not in acute distress.    Appearance: Normal appearance. She is normal weight.  Cardiovascular:     Rate and Rhythm: Normal rate and regular rhythm.     Heart sounds: Normal heart sounds.  Pulmonary:     Effort: Pulmonary effort is normal.     Breath sounds: Normal breath sounds.  Neurological:     Mental Status: She is alert and oriented to person, place, and time. Mental status is at baseline.  Psychiatric:        Mood and Affect: Mood  normal.        Behavior: Behavior normal.        Thought Content: Thought content normal.  Judgment: Judgment normal.           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 nightly as needed insomnia.  We also discussed meditation as a means to help calm anxiety.  Reassess in 1 to 2 weeks

## 2018-11-18 ENCOUNTER — Other Ambulatory Visit: Payer: Self-pay

## 2018-11-18 ENCOUNTER — Encounter: Payer: Self-pay | Admitting: Neurology

## 2018-11-18 ENCOUNTER — Ambulatory Visit (INDEPENDENT_AMBULATORY_CARE_PROVIDER_SITE_OTHER): Payer: Medicare Other | Admitting: Neurology

## 2018-11-18 VITALS — BP 98/62 | HR 53 | Ht 64.0 in | Wt 120.0 lb

## 2018-11-18 DIAGNOSIS — F419 Anxiety disorder, unspecified: Secondary | ICD-10-CM | POA: Diagnosis not present

## 2018-11-18 DIAGNOSIS — G3184 Mild cognitive impairment, so stated: Secondary | ICD-10-CM | POA: Diagnosis not present

## 2018-11-18 MED ORDER — ESCITALOPRAM OXALATE 20 MG PO TABS: 20.0000 mg | ORAL_TABLET | Freq: Every day | ORAL | 3 refills | Status: DC

## 2018-11-18 NOTE — Patient Instructions (Signed)
Very sorry for your loss. 1. Increase Lexapro to 20mg  daily. With your current bottle of Lexapro 10mg , take 2 tablets daily. Once done, your new bottle will be for Lexapro 20mg , take 1 tablet daily  2. Continue to monitor driving  3. Follow-up in 6 months, call for any changes   RECOMMENDATIONS FOR ALL PATIENTS WITH MEMORY PROBLEMS: 1. Continue to exercise (Recommend 30 minutes of walking everyday, or 3 hours every week) 2. Increase social interactions - continue going to [place] and enjoy social gatherings with friends and family 3. Eat healthy, avoid fried foods and eat more fruits and vegetables 4. Maintain adequate blood pressure, blood sugar, and blood cholesterol level. Reducing the risk of stroke and cardiovascular disease also helps promoting better memory. 5. Avoid stressful situations. Live a simple life and avoid aggravations. Organize your time and prepare for the next day in anticipation. 6. Sleep well, avoid any interruptions of sleep and avoid any distractions in the bedroom that may interfere with adequate sleep quality 7. Avoid sugar, avoid sweets as there is a strong link between excessive sugar intake, diabetes, and cognitive impairment The Mediterranean diet has been shown to help patients reduce the risk of progressive memory disorders and reduces cardiovascular risk. This includes eating fish, eat fruits and green leafy vegetables, nuts like almonds and hazelnuts, walnuts, and also use olive oil. Avoid fast foods and fried foods as much as possible. Avoid sweets and sugar as sugar use has been linked to worsening of memory function.

## 2018-11-18 NOTE — Progress Notes (Signed)
NEUROLOGY FOLLOW UP OFFICE NOTE  Nancy [name] [id number] [birth year]  HISTORY OF PRESENT ILLNESS: I had the pleasure of seeing Nancy [name] in follow-up in the neurology clinic on 11/18/2018.  The patient was last seen 7 months ago for worsening memory. She is alone in the office today, her husband passed away last month. Records and images were personally reviewed where available. MOCA score in June 2019 was 25/30. She was noted to be anxious during her visit, we discussed how anxiety can affect memory, she was started on Lexapro 10mg  daily. She had an MRI brain with and without contrast in July 2019 which I personally reviewed, no acute changes, there was mild diffuse volume loss, minimal chronic microvascular disease. Since her last visit, she reports her memory has been "so-so." She had to resume driving after her husband passed away, and states that since she had not been driving for a long time, she was missing turns but now has a GPS. She denies missing bill payments and rarely forgets her medications. Her daughters have been helping her with her husband's paperwork. She is understandably still grieving her husband and reports a lot of anxiety, waking up at 3am. She is asking to increase Lexapro dose. She denies any headaches, dizziness, vision changes, focal numbness/tingling/weakness, no falls.   History on Initial Assessment 04/06/2018: This is a pleasant 73 year old ambidextrous woman with a history of right chronic subdural hematoma s/p craniectomy in 2013, hyperlipidemia, hypertension, CAD, presenting for evaluation of worsening memory. She and her husband reports memory changes started soon after her brain surgery in 2013, but have become more progressive and significantly noticeable in the past year. Soon after surgery, her husband noticed that she would have word-finding difficulties, mixing up [place] and [place]. Over the past year, the word-finding difficulties have worsened, she  states she sometimes forgets her grandparents names, when she meant to say grandchildren. She got a shock around 1-1.5 years ago at the doctor's office when she could not remember her address and had to look at her drivers license. She loses things a lot at home, she has 9 pairs of reading glasses and always has a hard time finding them. She does not cook as much and denies leaving the stove on. She continues to manage bills and medications without significant difficulties, she occasionally forgets her medications when they are busy. She stopped driving after heart surgery years ago, she denied getting lost but felt afraid. She is able to bathe and dress independently. Over the past year, she has become quite anxious about minor things, sometimes the dog barking would make her very nervous. She has found taking [place] helps with this. She has a prescription for Xanax but does not take it regularly. She was noted to be anxious at several points during the visit today.  She denies any regular headaches but has a light one today from running around. She has light dizziness when standing at times. She denies any diplopia, dysarthria/dysphagia, focal numbness/tingling/weakness, both feet are numb. She has low back pain and occasional neck pain. She has constipation. No anosmia, tremors, REM behavior disorder. Her husband denies any paranoia or hallucinations. She does not sleep well, no wandering behavior. Her mother and maternal grandmother had memory issues. Aside from head injury in 2013 (hit her head getting up, leading to subdural hematoma), no other head injuries or falls. She rarely drinks alcohol.  PAST MEDICAL HISTORY: Past Medical History:  Diagnosis Date  . Anxiety  state, unspecified   . CHEST PAIN UNSPECIFIED   . COLONIC POLYPS, HX OF   . CORONARY ATHEROSCLEROSIS NATIVE CORONARY ARTERY   . DYSLIPIDEMIA   . Essential hypertension, benign   . Osteoporosis     MEDICATIONS: Current  Outpatient Medications on File Prior to Visit  Medication Sig Dispense Refill  . aspirin 81 MG tablet Take 81 mg by mouth daily.     . Cholecalciferol (VITAMIN D-3 PO) Take 1 tablet by mouth every other day.     . Coenzyme Q10 (CO Q 10 PO) Take 1 capsule by mouth every other day.    . diphenhydrAMINE (BENADRYL) 25 MG tablet Take 25 mg by mouth at bedtime as needed. For sleep    . diphenhydramine-acetaminophen (TYLENOL PM) 25-500 MG TABS Take 1 tablet by mouth at bedtime as needed. For insomnia/headache    . escitalopram (LEXAPRO) 10 MG tablet Take 1 tablet (10 mg total) by mouth daily. 90 tablet 3  . ezetimibe (ZETIA) 10 MG tablet TAKE 1 TABLET BY MOUTH ONCE DAILY 30 tablet 6  . losartan (COZAAR) 25 MG tablet TAKE 1 TABLET BY MOUTH ONCE DAILY 90 tablet 1  . metoprolol succinate (TOPROL-XL) 25 MG 24 hr tablet TAKE 3 TABLETS BY MOUTH ONCE DAILY 90 tablet 3  . nitroGLYCERIN (NITROSTAT) 0.4 MG SL tablet DISSOLVE ONE TABLET UNDER THE TONGUE EVERY 5 MINUTES AS NEEDED FOR CHEST PAIN.  DO NOT EXCEED A TOTAL OF 3 DOSES IN 15 MINUTES 75 tablet 2  . Omega-3 Fatty Acids (EQL OMEGA 3 FISH OIL) 1400 MG CAPS Take 1 tablet by mouth daily.    . polyethylene glycol (MIRALAX / GLYCOLAX) packet Take 17 g by mouth daily.     . rosuvastatin (CRESTOR) 10 MG tablet TAKE 1 TABLET BY MOUTH ONCE DAILY 90 tablet 2  . traZODone (DESYREL) 50 MG tablet Take 0.5-1 tablets (25-50 mg total) by mouth at bedtime as needed for sleep. 30 tablet 3  . vitamin E (VITAMIN E) 400 UNIT capsule Take 400 Units by mouth daily.     No current facility-administered medications on file prior to visit.     ALLERGIES: Allergies  Allergen Reactions  . Alendronate Shortness Of Breath and Swelling  . Atorvastatin Other (See Comments)    Muscle cramps  . Bactrim [Sulfamethoxazole-Trimethoprim] Other (See Comments)    Blisters/skin peeling, nausea  . Buprenorphine Hcl Nausea Only  . Morphine Nausea Only    OTHER  . Morphine And Related  Nausea Only  . Prednisone Other (See Comments)    Unknown  . Sulfa Antibiotics Itching    Blisters/skin peeling  . Sulfasalazine Itching    Blisters/skin peeling  . Sulfonamide Derivatives Other (See Comments)    Blisters/skin peeling    FAMILY HISTORY: Family History  Problem Relation Age of Onset  . Hypertension Mother   . Cancer Mother        COLON  . Kidney disease Mother   . Heart attack Father   . Hypertension Brother   . Stroke Maternal Grandmother   . Cancer Maternal Grandfather        INTESTINAL     SOCIAL HISTORY: Social History   Socioeconomic History  . Marital status: Married    Spouse name: Not on file  . Number of children: Not on file  . Years of education: Not on file  . Highest education level: Not on file  Occupational History  . Not on file  Social Needs  . Financial resource strain:  Not on file  . Food insecurity:    Worry: Not on file    Inability: Not on file  . Transportation needs:    Medical: Not on file    Non-medical: Not on file  Tobacco Use  . Smoking status: Never Smoker  . Smokeless tobacco: Never Used  Substance and Sexual Activity  . Alcohol use: Yes    Comment: occasional glass wine  . Drug use: No  . Sexual activity: Not Currently    Partners: Male    Comment: 1ST INTERCOURSE- 25, PARTNERS- 1 , MARRIED - 28 YRS   Lifestyle  . Physical activity:    Days per week: Not on file    Minutes per session: Not on file  . Stress: Not on file  Relationships  . Social connections:    Talks on phone: Not on file    Gets together: Not on file    Attends religious service: Not on file    Active member of club or organization: Not on file    Attends meetings of clubs or organizations: Not on file    Relationship status: Not on file  . Intimate partner violence:    Fear of current or ex partner: Not on file    Emotionally abused: Not on file    Physically abused: Not on file    Forced sexual activity: Not on file  Other Topics  Concern  . Not on file  Social History Narrative   Pt lives in 2 story home with her husband, Nancy [name]   Has 2 adult daughters, 2 granddaughters   12th grade education   Retired Charity fundraiser     REVIEW OF SYSTEMS: Constitutional: No fevers, chills, or sweats, no generalized fatigue, change in appetite Eyes: No visual changes, double vision, eye pain Ear, nose and throat: No hearing loss, ear pain, nasal congestion, sore throat Cardiovascular: No chest pain, palpitations Respiratory:  No shortness of breath at rest or with exertion, wheezes GastrointestinaI: No nausea, vomiting, diarrhea, abdominal pain, fecal incontinence Genitourinary:  No dysuria, urinary retention or frequency Musculoskeletal:  No neck pain, back pain Integumentary: No rash, pruritus, skin lesions Neurological: as above Psychiatric: + depression, insomnia, anxiety Endocrine: No palpitations, fatigue, diaphoresis, mood swings, change in appetite, change in weight, increased thirst Hematologic/Lymphatic:  No anemia, purpura, petechiae. Allergic/Immunologic: no itchy/runny eyes, nasal congestion, recent allergic reactions, rashes  PHYSICAL EXAM: Vitals:   11/18/18 1120  BP: 98/62  Pulse: (!) 53  SpO2: 99%   General: No acute distress Head:  Normocephalic/atraumatic Neck: supple, no paraspinal tenderness, full range of motion Heart:  Regular rate and rhythm Lungs:  Clear to auscultation bilaterally Back: No paraspinal tenderness Skin/Extremities: No rash, no edema Neurological Exam: alert and oriented to person, place, and time. No aphasia or dysarthria. Fund of knowledge is appropriate.  Recent and remote memory are intact.  Attention and concentration are intact. Difficulty with repetition and fluency. Montreal Cognitive Assessment  11/18/2018 04/06/2018  Visuospatial/ Executive (0/5) 4 5  Naming (0/3) 3 3  Attention: Read list of digits (0/2) 2 2  Attention: Read list of letters (0/1) 1 1  Attention: Serial 7  subtraction starting at 100 (0/3) 3 3  Language: Repeat phrase (0/2) 0 2  Language : Fluency (0/1) 0 0  Abstraction (0/2) 0 2  Delayed Recall (0/5) 4 1  Orientation (0/6) 5 6  Total 22 25  Adjusted Score (based on education) 23 -    Cranial nerves: Pupils equal, round, reactive to  light.  Extraocular movements intact with no nystagmus. Visual fields full. Facial sensation intact. No facial asymmetry. Tongue, uvula, palate midline.  Motor: Bulk and tone normal, muscle strength 5/5 throughout with no pronator drift.  Sensation to light touch ntact.  No extinction to double simultaneous stimulation.Finger to nose testing intact.  Gait narrow-based and steady, able to tandem walk adequately.  Romberg negative.  IMPRESSION: This is a pleasant 73 yo ambidextrous woman with a history of right chronic subdural hematoma s/p craniectomy in 2013, hyperlipidemia, hypertension, CAD, with worsening memory. MRI brain no acute changes, there was mild diffuse atrophy. MOCA score today 23/30 (25/30 in June 2019), however she is presently grieving for her recently deceased husband. She was anxious on last visit and requests for an increase in Lexapro dose. Increase to 20mg  daily. We discussed cholinesterase inhibitors such as Aricept, expectations from the medication. At this point, continue to monitor symptoms and monitor driving. We will re-evaluate in 6 months. She declines seeing a Barrister's clerk. We again discussed the importance of control of vascular risk factors, physical exercise, and brain stimulation exercises for brain health. She will follow-up in 6 months and knows to call for any changes.   Thank you for allowing me to participate in her care.  Please do not hesitate to call for any questions or concerns.  The duration of this appointment visit was 30 minutes of face-to-face time with the patient.  Greater than 50% of this time was spent in counseling, explanation of diagnosis, planning of further  management, and coordination of care.   Ellouise Newer, M.D.   CC: Dr. Dennard Schaumann

## 2018-12-20 ENCOUNTER — Ambulatory Visit (INDEPENDENT_AMBULATORY_CARE_PROVIDER_SITE_OTHER): Payer: Medicare Other | Admitting: Family Medicine

## 2018-12-20 ENCOUNTER — Encounter: Payer: Self-pay | Admitting: Family Medicine

## 2018-12-20 VITALS — BP 150/68 | HR 66 | Temp 97.8°F | Resp 14 | Ht 65.0 in | Wt 122.0 lb

## 2018-12-20 DIAGNOSIS — F5101 Primary insomnia: Secondary | ICD-10-CM

## 2018-12-20 DIAGNOSIS — L600 Ingrowing nail: Secondary | ICD-10-CM

## 2018-12-20 MED ORDER — ALPRAZOLAM 0.5 MG PO TABS: 0.5000 mg | ORAL_TABLET | Freq: Every evening | ORAL | 1 refills | Status: DC | PRN

## 2018-12-20 NOTE — Progress Notes (Signed)
Subjective:    Patient ID: Nancy [name], female    DOB: 09-22-1946, 73 y.o.   MRN: [id number]  HPI 11/03/18 Patient lost her husband December 1 due to lung cancer.  Since that time she had a difficult time sleeping.  She states that she goes to bed at 8:00 in the evening.  She will toss and turn for 2 hours.  She will then watch TV in the background until she finally drifts off usually 2 or 3 in the morning.  She will wake up 1 or 2 hours later and then she is unable to sleep.  This leaves her feeling extremely tired throughout the day.  She does have some mild age-related memory problems.  These have not worsened.  She denies depression although she is obviously grieving.  She denies any suicidal ideation.  She is interested in something that may help her fall asleep there would be non-habit-forming and hopefully also not exacerbate her mild underlying memory problems.  At that time, my plan was: We will try trazodone 25 to 50 mg p.o. nightly as needed insomnia.  We also discussed meditation as a means to help calm anxiety.  Reassess in 1 to 2 weeks  12/20/18 Patient states that the trazodone is not helpful.  She is unable to sleep.  This is now on every night occurrence.  She tosses and turns all throughout the night.  She seldom gets more than 2 to 3 hours of sleep per night.  She always feels tired and restless in the morning.  She also has an ingrown toenail on her right foot.  The fifth toenail is ingrown on the medial portion.  The skin around the nail is erythematous and indurated and tender.  The patient is requesting removal however she does not want ablation of the nail matrix.  Previous ablation of the great toe nail matrix by a podiatrist in the past left a dystrophic appearing toenail that the patient does not like the cosmetic appearance.  She prefers to have the toenail removed and allow it to grow back naturally and see if it will grow back healthy. Past Medical History:  Diagnosis  Date  . Anxiety state, unspecified   . CHEST PAIN UNSPECIFIED   . COLONIC POLYPS, HX OF   . CORONARY ATHEROSCLEROSIS NATIVE CORONARY ARTERY   . DYSLIPIDEMIA   . Essential hypertension, benign   . Osteoporosis    Past Surgical History:  Procedure Laterality Date  . CORONARY ANGIOPLASTY WITH STENT PLACEMENT  2010   3 times  . CORONARY ARTERY BYPASS GRAFT  2009  . 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 NASAL SINUS SURGERY    . POLYPECTOMY     Current Outpatient Medications on File Prior to Visit  Medication Sig Dispense Refill  . aspirin 81 MG tablet Take 81 mg by mouth daily.     . Cholecalciferol (VITAMIN D-3 PO) Take 1 tablet by mouth every other day.     . Coenzyme Q10 (CO Q 10 PO) Take 1 capsule by mouth every other day.    . diphenhydrAMINE (BENADRYL) 25 MG tablet Take 25 mg by mouth at bedtime as needed. For sleep    . diphenhydramine-acetaminophen (TYLENOL PM) 25-500 MG TABS Take 1 tablet by mouth at bedtime as needed. For insomnia/headache    .  escitalopram (LEXAPRO) 20 MG tablet Take 1 tablet (20 mg total) by mouth daily. 90 tablet 3  . ezetimibe (ZETIA) 10 MG tablet TAKE 1 TABLET BY MOUTH ONCE DAILY 30 tablet 6  . losartan (COZAAR) 25 MG tablet TAKE 1 TABLET BY MOUTH ONCE DAILY 90 tablet 1  . metoprolol succinate (TOPROL-XL) 25 MG 24 hr tablet TAKE 3 TABLETS BY MOUTH ONCE DAILY 90 tablet 3  . nitroGLYCERIN (NITROSTAT) 0.4 MG SL tablet DISSOLVE ONE TABLET UNDER THE TONGUE EVERY 5 MINUTES AS NEEDED FOR CHEST PAIN.  DO NOT EXCEED A TOTAL OF 3 DOSES IN 15 MINUTES 75 tablet 2  . Omega-3 Fatty Acids (EQL OMEGA 3 FISH OIL) 1400 MG CAPS Take 1 tablet by mouth daily.    . polyethylene glycol (MIRALAX / GLYCOLAX) packet Take 17 g by mouth daily.     . rosuvastatin (CRESTOR) 10 MG tablet TAKE 1 TABLET BY  MOUTH ONCE DAILY 90 tablet 2  . traZODone (DESYREL) 50 MG tablet Take 0.5-1 tablets (25-50 mg total) by mouth at bedtime as needed for sleep. 30 tablet 3  . vitamin E (VITAMIN E) 400 UNIT capsule Take 400 Units by mouth daily.     No current facility-administered medications on file prior to visit.    Allergies  Allergen Reactions  . Alendronate Shortness Of Breath and Swelling  . Atorvastatin Other (See Comments)    Muscle cramps  . Bactrim [Sulfamethoxazole-Trimethoprim] Other (See Comments)    Blisters/skin peeling, nausea  . Buprenorphine Hcl Nausea Only  . Morphine Nausea Only    OTHER  . Morphine And Related Nausea Only  . Prednisone Other (See Comments)    Unknown  . Sulfa Antibiotics Itching    Blisters/skin peeling  . Sulfasalazine Itching    Blisters/skin peeling  . Sulfonamide Derivatives Other (See Comments)    Blisters/skin peeling   Social History   Socioeconomic History  . Marital status: Married    Spouse name: Not on file  . Number of children: Not on file  . Years of education: Not on file  . Highest education level: Not on file  Occupational History  . Not on file  Social Needs  . Financial resource strain: Not on file  . Food insecurity:    Worry: Not on file    Inability: Not on file  . Transportation needs:    Medical: Not on file    Non-medical: Not on file  Tobacco Use  . Smoking status: Never Smoker  . Smokeless tobacco: Never Used  Substance and Sexual Activity  . Alcohol use: Yes    Comment: occasional glass wine  . Drug use: No  . Sexual activity: Not Currently    Partners: Male    Comment: 1ST INTERCOURSE- 22, PARTNERS- 1 , MARRIED - 31 YRS   Lifestyle  . Physical activity:    Days per week: Not on file    Minutes per session: Not on file  . Stress: Not on file  Relationships  . Social connections:    Talks on phone: Not on file    Gets together: Not on file    Attends religious service: Not on file    Active member of club  or organization: Not on file    Attends meetings of clubs or organizations: Not on file    Relationship status: Not on file  . Intimate partner violence:    Fear of current or ex partner: Not on file    Emotionally abused: Not on  file    Physically abused: Not on file    Forced sexual activity: Not on file  Other Topics Concern  . Not on file  Social History Narrative   Pt lives in 2 story home with her husband, Jonni Sanger   Has 2 adult daughters, 2 granddaughters   12th grade education   Retired Char[employer] fundraiser      Review of Systems  All other systems reviewed and are negative.      Objective:   Physical Exam Vitals signs reviewed.  Constitutional:      General: She is not in acute distress.    Appearance: Normal appearance. She is normal weight.  Cardiovascular:     Rate and Rhythm: Normal rate and regular rhythm.     Heart sounds: Normal heart sounds.  Pulmonary:     Effort: Pulmonary effort is normal.     Breath sounds: Normal breath sounds.  Musculoskeletal:       Feet:  Neurological:     Mental Status: She is alert and oriented to person, place, and time. Mental status is at baseline.  Psychiatric:        Mood and Affect: Mood normal.        Behavior: Behavior normal.        Thought Content: Thought content normal.        Judgment: Judgment normal.           Assessment & Plan:  Ingrown toenail, insomnia  The right fifth toe was anesthetized using a digital block with 0.1% lidocaine without epinephrine.  A tourniquet was applied to the base of the toe.  A metal elevator was then used to separate the toe nail from the underlying nail bed.  The entire toenail was then removed with gentle traction using a pair of hemostats.  Neosporin was then applied to the nailbed.  The toe was wrapped with petroleum gauze and then covered with Coban.  Wound care was discussed.  Recheck in 1 week if no better recheck sooner if it becomes erythematous painful or swollen.  Regarding  her insomnia, I would recommend trying Xanax 0.5 mg p.o. nightly as needed insomnia due to its short half-life.  Hopefully this will wear out of the patient symptoms so during the daytime it will not exacerbate her mild memory impairment or increase her risk of falls or dizziness.

## 2018-12-28 ENCOUNTER — Telehealth: Payer: Self-pay | Admitting: Cardiovascular Disease

## 2018-12-28 NOTE — Telephone Encounter (Signed)
I spoke to the patient who called because she is experiencing on and off chest tightness, SOB and nausea over the past few months, but it is becoming more frequent over the past week.  She said that her BP has been elevated 165/64 at times.  Her HR seems to be normal.  She has an OV with Dr Acie Fredrickson 3/26, but I have advised her to go to the ED for further evaluation.  She verbalized understanding.

## 2018-12-28 NOTE — Telephone Encounter (Signed)
New Message   Pt c/o of Chest Pain: STAT if CP now or developed within 24 hours  1. Are you having CP right now? Yes  Chest Tightness Slightly   2. Are you experiencing any other symptoms (ex. SOB, nausea, vomiting, sweating)? Slight Nausea but doesn't know it its from her medication Trazodone  3. How long have you been experiencing CP? 5 or 6 months   4. Is your CP continuous or coming and going? Coming and going   5. Have you taken Nitroglycerin? Yes, Pt used it 2 days ago   Pt says she has irregular Heart Beats too   Pt is scheduled with Dr Acie Fredrickson 03/26 at 8am  ?

## 2018-12-29 NOTE — Telephone Encounter (Signed)
Called patient to see how she is doing today. She reports difficulty falling asleep and waking up through the night, recently started trazodone. States she gets a cramp in her chest that hurts really bad after she takes the medication and lays down at night. Getting up and walking around makes the pain better; associates onset of pain with starting trazodone. She is currently under a great deal of stress with the recent death of her husband and all of the paperwork she has to do for benefits, etc. States she has the support of 2 daughters. I advised her to try taking the trazodone earlier in the night and wait a while before laying down. She denies chest pain at any other time during the day or night. I answered all of her questions to her satisfaction and she thanked me for the call. She is aware of her follow-up appointment with Dr. Acie Fredrickson on 3/26.

## 2019-01-02 ENCOUNTER — Other Ambulatory Visit: Payer: Self-pay

## 2019-01-02 ENCOUNTER — Emergency Department (HOSPITAL_COMMUNITY): Payer: Medicare Other

## 2019-01-02 ENCOUNTER — Encounter (HOSPITAL_COMMUNITY): Payer: Self-pay | Admitting: Emergency Medicine

## 2019-01-02 ENCOUNTER — Emergency Department (HOSPITAL_COMMUNITY)
Admission: EM | Admit: 2019-01-02 | Discharge: 2019-01-03 | Disposition: A | Discharge status: Home / Self Care | Payer: Medicare Other | Attending: Emergency Medicine | Admitting: Emergency Medicine

## 2019-01-02 DIAGNOSIS — M436 Torticollis: Secondary | ICD-10-CM | POA: Insufficient documentation

## 2019-01-02 DIAGNOSIS — Z7982 Long term (current) use of aspirin: Secondary | ICD-10-CM | POA: Insufficient documentation

## 2019-01-02 DIAGNOSIS — M542 Cervicalgia: Secondary | ICD-10-CM | POA: Diagnosis not present

## 2019-01-02 DIAGNOSIS — I10 Essential (primary) hypertension: Secondary | ICD-10-CM | POA: Diagnosis not present

## 2019-01-02 DIAGNOSIS — Z79899 Other long term (current) drug therapy: Secondary | ICD-10-CM | POA: Insufficient documentation

## 2019-01-02 DIAGNOSIS — R51 Headache: Secondary | ICD-10-CM | POA: Insufficient documentation

## 2019-01-02 LAB — CBC
HCT: 42.3 % (ref 36.0–46.0)
Hemoglobin: 13.8 g/dL (ref 12.0–15.0)
MCH: 30.5 pg (ref 26.0–34.0)
MCHC: 32.6 g/dL (ref 30.0–36.0)
MCV: 93.6 fL (ref 80.0–100.0)
NRBC: 0 % (ref 0.0–0.2)
PLATELETS: 227 10*3/uL (ref 150–400)
RBC: 4.52 MIL/uL (ref 3.87–5.11)
RDW: 12.2 % (ref 11.5–15.5)
WBC: 11.7 10*3/uL — ABNORMAL HIGH (ref 4.0–10.5)

## 2019-01-02 LAB — BASIC METABOLIC PANEL
ANION GAP: 8 (ref 5–15)
BUN: 16 mg/dL (ref 8–23)
CALCIUM: 9.1 mg/dL (ref 8.9–10.3)
CO2: 24 mmol/L (ref 22–32)
Chloride: 103 mmol/L (ref 98–111)
Creatinine, Ser: 0.96 mg/dL (ref 0.44–1.00)
GFR calc Af Amer: >60 mL/min (ref >60)
GFR, EST NON AFRICAN AMERICAN: 59 mL/min — AB (ref >60)
GLUCOSE: 149 mg/dL — AB (ref 70–99)
POTASSIUM: 4 mmol/L (ref 3.5–5.1)
Sodium: 135 mmol/L (ref 135–145)

## 2019-01-02 MED ORDER — ACETAMINOPHEN 500 MG PO TABS
1000.0000 mg | ORAL_TABLET | Freq: Once | ORAL | Status: AC
  Administered 2019-01-03: 1000 mg via ORAL
  Filled 2019-01-02: qty 2

## 2019-01-02 MED ORDER — IBUPROFEN 800 MG PO TABS
800.0000 mg | ORAL_TABLET | Freq: Once | ORAL | Status: AC
  Administered 2019-01-03: 800 mg via ORAL
  Filled 2019-01-02: qty 1

## 2019-01-02 MED ORDER — LIDOCAINE 5 % EX PTCH
2.0000 | MEDICATED_PATCH | CUTANEOUS | Status: DC
  Administered 2019-01-03: 2 via TRANSDERMAL
  Filled 2019-01-02: qty 2

## 2019-01-02 NOTE — ED Notes (Signed)
Patient transported to CT 

## 2019-01-02 NOTE — ED Triage Notes (Signed)
Pt reports neck pain/neck stiffness X2 days. Denis HA, fevers. Denies injury.

## 2019-01-02 NOTE — ED Provider Notes (Signed)
[facility] EMERGENCY DEPARTMENT Provider Note   CSN: [id number] Arrival date & time: 01/02/19  2036    History   Chief Complaint Chief Complaint  Patient presents with  . Neck Pain    HPI Nancy [name] is a 73 y.o. female.     The history is provided by the patient.  Neck Pain  Pain location:  Occipital region Quality:  Aching Pain radiates to:  Does not radiate Pain severity:  Moderate Pain is:  Same all the time Onset quality:  Gradual Duration:  3 weeks Timing:  Constant Progression:  Unchanged Chronicity:  New Context: not fall, not jumping from heights and not lifting a heavy object   Relieved by:  Nothing Worsened by:  Nothing Ineffective treatments: one dose of aspirin.  Has pain all over the body for the same period of time. Associated symptoms: no bladder incontinence, no bowel incontinence, no chest pain, no fever, no headaches, no leg pain, no numbness, no paresis, no photophobia, no syncope, no tingling, no visual change, no weakness and no weight loss   Risk factors: no hx of head and neck radiation, no recent epidural and no recent head injury   Patient with known CAD presents with 3 weeks of neck pain and muscle and joint pain of the entire body of the same time. She denies trauma. She has been laying in bed for that period of time.  She took one dose of aspirin for same on Saturday but none before or since.  No f/c/r. No photophobia. No sore throat.  No cough.  Pain is worse with side movement of the neck and legs.  No weakness or numbness.  No changes in vision or speech.  No rashes on the skin.     Past Medical History:  Diagnosis Date  . Anxiety state, unspecified   . CHEST PAIN UNSPECIFIED   . COLONIC POLYPS, HX OF   . CORONARY ATHEROSCLEROSIS NATIVE CORONARY ARTERY   . DYSLIPIDEMIA   . Essential hypertension, benign   . Osteoporosis     Patient Active Problem List   Diagnosis Date Noted  . Osteoporosis   . 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 ANXIETY STATE, UNSPECIFIED 12/24/2009  . CHEST PAIN UNSPECIFIED 08/27/2009  . Hyperlipidemia 08/01/2009  . ESSENTIAL HYPERTENSION, BENIGN 08/01/2009  . Coronary artery disease involving native coronary artery of native heart without angina pectoris 08/01/2009  . CONSTIPATION 06/27/2008  . COLONIC POLYPS, HX OF 06/27/2008    Past Surgical History:  Procedure Laterality Date  . CORONARY ANGIOPLASTY WITH STENT PLACEMENT  2010   3 times  . CORONARY ARTERY BYPASS GRAFT  2009  . 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 NASAL SINUS SURGERY    . POLYPECTOMY       OB History    Gravida  2   Para  2   Term      Preterm      AB      Living  2     SAB      TAB      Ectopic      Multiple      Live Births               Home Medications    Prior to Admission medications  Medication Sig Start Date End Date Taking? Authorizing Provider  ALPRAZolam Duanne Moron) 0.5 MG tablet Take 1 tablet (0.5 mg total) by mouth at bedtime as needed for sleep. 12/20/18   Susy Frizzle, MD  aspirin 81 MG tablet Take 81 mg by mouth daily.     [provider]  Cholecalciferol (VITAMIN D-3 PO) Take 1 tablet by mouth every other day.     [provider]  Coenzyme Q10 (CO Q 10 PO) Take 1 capsule by mouth every other day.    [provider]  diphenhydramine-acetaminophen (TYLENOL PM) 25-500 MG TABS Take 1 tablet by mouth at bedtime as needed. For insomnia/headache    [provider]  escitalopram (LEXAPRO) 20 MG tablet Take 1 tablet (20 mg total) by mouth daily. 11/18/18   Cameron Sprang, MD  ezetimibe (ZETIA) 10 MG tablet TAKE 1 TABLET BY MOUTH ONCE DAILY 10/20/18   Nahser, Wonda Cheng, MD  losartan (COZAAR) 25 MG tablet TAKE 1 TABLET BY MOUTH ONCE DAILY  08/17/18   Nahser, Wonda Cheng, MD  metoprolol succinate (TOPROL-XL) 25 MG 24 hr tablet TAKE 3 TABLETS BY MOUTH ONCE DAILY 09/20/18   Susy Frizzle, MD  nitroGLYCERIN (NITROSTAT) 0.4 MG SL tablet DISSOLVE ONE TABLET UNDER THE TONGUE EVERY 5 MINUTES AS NEEDED FOR CHEST PAIN.  DO NOT EXCEED A TOTAL OF 3 DOSES IN 15 MINUTES 06/21/18   Nahser, Wonda Cheng, MD  Omega-3 Fatty Acids (EQL OMEGA 3 FISH OIL) 1400 MG CAPS Take 1 tablet by mouth daily.    [provider]  polyethylene glycol (MIRALAX / GLYCOLAX) packet Take 17 g by mouth daily.     [provider]  rosuvastatin (CRESTOR) 10 MG tablet TAKE 1 TABLET BY MOUTH ONCE DAILY 01/17/18   Nahser, Wonda Cheng, MD  traZODone (DESYREL) 50 MG tablet Take 0.5-1 tablets (25-50 mg total) by mouth at bedtime as needed for sleep. 11/03/18   Susy Frizzle, MD  vitamin E (VITAMIN E) 400 UNIT capsule Take 400 Units by mouth daily.    [provider]    Family History Family History  Problem Relation Age of Onset  . Hypertension Mother   . Cancer Mother        COLON  . Kidney disease Mother   . Heart attack Father   . Hypertension Brother   . Stroke Maternal Grandmother   . Cancer Maternal Grandfather        INTESTINAL     Social History Social History   Tobacco Use  . Smoking status: Never Smoker  . Smokeless tobacco: Never Used  Substance Use Topics  . Alcohol use: Yes    Comment: occasional glass wine  . Drug use: No     Allergies   Alendronate; Atorvastatin; Bactrim [sulfamethoxazole-trimethoprim]; Buprenorphine hcl; Morphine; Morphine and related; Prednisone; Sulfa antibiotics; Sulfasalazine; and Sulfonamide derivatives   Review of Systems Review of Systems  Constitutional: Negative for fever and weight loss.  HENT: Negative for congestion, sore throat, tinnitus and voice change.   Eyes: Negative for photophobia and visual disturbance.  Respiratory: Negative for cough and shortness of breath.   Cardiovascular:  Negative for chest pain, palpitations, leg swelling and syncope.  Gastrointestinal: Negative for bowel incontinence, nausea and vomiting.  Genitourinary: Negative for bladder incontinence and flank pain.  Musculoskeletal: Positive for myalgias and neck pain. Negative for back pain, gait problem, joint swelling and neck stiffness.  Skin: Negative for rash.  Neurological: Negative for tingling, facial asymmetry, speech difficulty,  weakness, numbness and headaches.  All other systems reviewed and are negative.    Physical Exam Updated Vital Signs BP 138/68 (BP Location: Right Arm)   Pulse 100   Temp 99 F (37.2 C) (Oral)   Resp 18   SpO2 97%   Physical Exam Vitals signs and nursing note reviewed.  Constitutional:      General: She is not in acute distress.    Appearance: Normal appearance. She is normal weight.  HENT:     Head: Normocephalic and atraumatic.     Nose: Nose normal.     Mouth/Throat:     Mouth: Mucous membranes are moist.     Pharynx: Oropharynx is clear.  Eyes:     Conjunctiva/sclera: Conjunctivae normal.     Pupils: Pupils are equal, round, and reactive to light.  Neck:     Musculoskeletal: Normal range of motion and neck supple. No neck rigidity, crepitus or spinous process tenderness.     Meningeal: Brudzinski's sign and Kernig's sign absent.  Cardiovascular:     Rate and Rhythm: Normal rate and regular rhythm.     Pulses: Normal pulses.     Heart sounds: Normal heart sounds.  Pulmonary:     Effort: Pulmonary effort is normal. No respiratory distress.     Breath sounds: Normal breath sounds. No wheezing or rales.  Abdominal:     General: Abdomen is flat. Bowel sounds are normal.     Tenderness: There is no abdominal tenderness. There is no guarding.  Musculoskeletal: Normal range of motion.        General: No tenderness.     Right lower leg: No edema.     Left lower leg: No edema.  Lymphadenopathy:     Cervical: No cervical adenopathy.  Skin:     General: Skin is warm and dry.     Capillary Refill: Capillary refill takes less than 2 seconds.  Neurological:     General: No focal deficit present.     Mental Status: She is alert and oriented to person, place, and time.     Cranial Nerves: No cranial nerve deficit.     Deep Tendon Reflexes: Reflexes normal.  Psychiatric:        Mood and Affect: Mood normal.        Behavior: Behavior normal.      ED Treatments / Results  Labs (all labs ordered are listed, but only abnormal results are displayed)    Procedures Procedures (including critical care time)  Medications Ordered in ED Medications  acetaminophen (TYLENOL) tablet 1,000 mg (has no administration in time range)  lidocaine (LIDODERM) 5 % 2 patch (has no administration in time range)  ibuprofen (ADVIL,MOTRIN) tablet 800 mg (has no administration in time range)     Symptoms have been going on for more than a week and I highly doubt infection. No fevers. No travel.  No exposures. Patient is not meningitic. Pain is all over there is no trauma. Will start NSAIDs and lidoderm and have patient follow up with her PMD for recheck   Final Clinical Impressions(s) / ED Diagnoses   Return for intractable cough, coughing up blood,fevers >100.4 unrelieved by medication, shortness of breath, intractable vomiting, chest pain, shortness of breath, weakness,numbness, changes in speech, facial asymmetry,abdominal pain, passing out,Inability to tolerate liquids or food, cough, altered mental status or any concerns. No signs of systemic illness or infection. The patient is nontoxic-appearing on exam and vital signs are within normal limits.   I  have reviewed the triage vital signs and the nursing notes. Pertinent labs &imaging results that were available during my care of the patient were reviewed by me and considered in my medical decision making (see chart for details).  After history, exam, and medical workup I feel the patient  has been appropriately medically screened and is safe for discharge home. Pertinent diagnoses were discussed with the patient. Patient was given return precautions.   Andreka Stucki, MD 01/03/19 [phone]

## 2019-01-03 DIAGNOSIS — M436 Torticollis: Secondary | ICD-10-CM | POA: Diagnosis not present

## 2019-01-03 MED ORDER — LIDOCAINE 5 % EX PTCH: 1.0000 | MEDICATED_PATCH | CUTANEOUS | 0 refills | Status: DC

## 2019-01-03 MED ORDER — NAPROXEN 500 MG PO TABS: 500.0000 mg | ORAL_TABLET | Freq: Two times a day (BID) | ORAL | 0 refills | Status: DC

## 2019-01-03 NOTE — ED Notes (Signed)
Patient verbalizes understanding of discharge instructions. Opportunity for questioning and answers were provided. Armband removed by staff, pt discharged from ED in wheelchair.  

## 2019-01-05 ENCOUNTER — Telehealth: Payer: Self-pay

## 2019-01-05 NOTE — Telephone Encounter (Signed)
Left message for patient to call back regarding appointment with Dr. Nahser on 3/26. Call placed due to restrictions enacted for Covid 19.   

## 2019-01-09 NOTE — Telephone Encounter (Signed)
F/U Message ° ° ° ° ° ° ° ° ° °Patient returned your call °

## 2019-01-11 NOTE — Telephone Encounter (Signed)
   Primary Cardiologist:  Mertie Moores, MD   Patient contacted.  History reviewed.  No symptoms to suggest any unstable cardiac conditions.  Based on discussion, with current pandemic situation, we will be postponing this appointment for Nancy [name] with a plan for f/u in 6-12 wks or sooner if feasible/necessary.  If symptoms change, she has been instructed to contact our office.   Routing to C19 CANCEL pool for tracking (P CV DIV CV19 CANCEL - reason for visit "other.") and assigning priority (1 = 4-6 wks, 2 = 6-12 wks, 3 = >12 wks).   Patient declined the option of a virtual office visit. I advised her to call back sooner if she has questions or concerns.  Emmaline Life, RN  01/11/2019 11:39 AM         .

## 2019-01-12 ENCOUNTER — Ambulatory Visit: Payer: Medicare Other | Admitting: Cardiovascular Disease

## 2019-01-17 ENCOUNTER — Other Ambulatory Visit: Payer: Self-pay | Admitting: Cardiovascular Disease

## 2019-01-31 ENCOUNTER — Telehealth: Payer: Self-pay | Admitting: Physician Assistant

## 2019-01-31 NOTE — Telephone Encounter (Signed)
Spoke with patient who confirmed all demographics.  She does not have a smart phone so she prefers a phone visit. She does not use her e-mail. She also stated that she does not have internet. She "cut it all off".

## 2019-02-02 ENCOUNTER — Other Ambulatory Visit: Payer: Self-pay

## 2019-02-02 ENCOUNTER — Telehealth (INDEPENDENT_AMBULATORY_CARE_PROVIDER_SITE_OTHER): Payer: Medicare Other | Admitting: Physician Assistant

## 2019-02-02 ENCOUNTER — Encounter: Payer: Self-pay | Admitting: Physician Assistant

## 2019-02-02 VITALS — BP 109/53 | HR 60 | Ht 65.0 in | Wt 122.0 lb

## 2019-02-02 DIAGNOSIS — I2510 Atherosclerotic heart disease of native coronary artery without angina pectoris: Secondary | ICD-10-CM | POA: Diagnosis not present

## 2019-02-02 DIAGNOSIS — R002 Palpitations: Secondary | ICD-10-CM

## 2019-02-02 DIAGNOSIS — E782 Mixed hyperlipidemia: Secondary | ICD-10-CM

## 2019-02-02 DIAGNOSIS — Z7189 Other specified counseling: Secondary | ICD-10-CM

## 2019-02-02 NOTE — Progress Notes (Signed)
Virtual Visit via Telephone Note   This visit type was conducted due to national recommendations for restrictions regarding the COVID-19 Pandemic (e.g. social distancing) in an effort to limit this patient's exposure and mitigate transmission in our community.  Due to her co-morbid illnesses, this patient is at least at moderate risk for complications without adequate follow up.  This format is felt to be most appropriate for this patient at this time.  The patient did not have access to video technology/had technical difficulties with video requiring transitioning to audio format only (telephone).  All issues noted in this document were discussed and addressed.  No physical exam could be performed with this format.  Please refer to the patient's chart for her  consent to telehealth for [facility].   Evaluation Performed:  Follow-up visit  Date:  02/02/2019   ID:  Nancy, [name] 02-Dec-1945, MRN [id number]  Patient Location: Home Provider Location: Home  PCP:  Susy Frizzle, MD  Cardiologist:  Mertie Moores, MD  Electrophysiologist:  None   Chief Complaint:  10 months follow up  History of Present Illness:    Nancy [name] is a 73 y.o. female with hx of CAD s/p CABG in 2008 & PCI in 2010, HLD, s/p craniotomy for subdural hematoma and HTN seen for follow up.   The patient does not have symptoms concerning for COVID-19 infection (fever, chills, cough, or new shortness of breath).   Myoview study in November, 2018 was low risk study with no evidence of ischemia.  Echocardiogram 08/2017 was normal.  Patient continues to have severe fatigue since last office visit with Dr. Acie Fredrickson.  No change in intensity.  Patient denies chest pain, shortness of breath, orthopnea, PND, syncope, lower extremity edema or melena.  Patient reports intermittent "fluttering sensation and palpitation".  Occurs very randomly, sometimes multiple times per month and other times she goes without symptoms  for few months.  Might feel fatigue afterwards otherwise asymptomatic.  Patient with prior history of craniotomy with residual intermittent speaking difficulty.  She is still grieving from her husband's death.  Taking sleeping pills at night.  Past Medical History:  Diagnosis Date  . Anxiety state, unspecified   . CHEST PAIN UNSPECIFIED   . COLONIC POLYPS, HX OF   . CORONARY ATHEROSCLEROSIS NATIVE CORONARY ARTERY   . DYSLIPIDEMIA   . Essential hypertension, benign   . Osteoporosis    Past Surgical History:  Procedure Laterality Date  . CORONARY ANGIOPLASTY WITH STENT PLACEMENT  2010   3 times  . CORONARY ARTERY BYPASS GRAFT  2009  . CRANIOTOMY  04/27/2012   Procedure: CRANIOTOMY HEMATOMA EVACUATION SUBDURAL;  Surgeon: Eustace Moore, MD;  Location: [place] [facility];  Service: Neurosurgery;  Laterality: Right;  Right Frontal Burr hole for Subdural Hematoma  . NASAL SINUS SURGERY    . POLYPECTOMY       Current Meds  Medication Sig  . ALPRAZolam (XANAX) 0.5 MG tablet Take 1 tablet (0.5 mg total) by mouth at bedtime as needed for sleep.  Marland Kitchen aspirin 81 MG tablet Take 81 mg by mouth daily.   . Cholecalciferol (VITAMIN D-3 PO) Take 1 tablet by mouth every other day.   . Coenzyme Q10 (CO Q 10 PO) Take 1 capsule by mouth every other day.  . diphenhydramine-acetaminophen (TYLENOL PM) 25-500 MG TABS Take 1 tablet by mouth at bedtime as needed. For insomnia/headache  . escitalopram (LEXAPRO) 20 MG tablet Take 1 tablet (20 mg total) by  mouth daily.  Marland Kitchen ezetimibe (ZETIA) 10 MG tablet TAKE 1 TABLET BY MOUTH ONCE DAILY  . lidocaine (LIDODERM) 5 % Place 1 patch onto the skin daily. Remove & Discard patch within 12 hours or as directed by MD  . losartan (COZAAR) 25 MG tablet TAKE 1 TABLET BY MOUTH ONCE DAILY  . metoprolol succinate (TOPROL-XL) 25 MG 24 hr tablet TAKE 3 TABLETS BY MOUTH ONCE DAILY  . naproxen (NAPROSYN) 500 MG tablet Take 1 tablet (500 mg total) by mouth 2 (two) times daily.  .  nitroGLYCERIN (NITROSTAT) 0.4 MG SL tablet DISSOLVE ONE TABLET UNDER THE TONGUE EVERY 5 MINUTES AS NEEDED FOR CHEST PAIN.  DO NOT EXCEED A TOTAL OF 3 DOSES IN 15 MINUTES  . Omega-3 Fatty Acids (EQL OMEGA 3 FISH OIL) 1400 MG CAPS Take 1 tablet by mouth daily.  . polyethylene glycol (MIRALAX / GLYCOLAX) packet Take 17 g by mouth daily.   . rosuvastatin (CRESTOR) 10 MG tablet Take 1 tablet by mouth once daily  . traZODone (DESYREL) 50 MG tablet Take 0.5-1 tablets (25-50 mg total) by mouth at bedtime as needed for sleep.  . vitamin E (VITAMIN E) 400 UNIT capsule Take 400 Units by mouth daily.     Allergies:   Alendronate; Atorvastatin; Bactrim [sulfamethoxazole-trimethoprim]; Buprenorphine hcl; Morphine; Morphine and related; Prednisone; Sulfa antibiotics; Sulfasalazine; and Sulfonamide derivatives   Social History   Tobacco Use  . Smoking status: Never Smoker  . Smokeless tobacco: Never Used  Substance Use Topics  . Alcohol use: Yes    Comment: occasional glass wine  . Drug use: No     Family Hx: The patient's family history includes Cancer in her maternal grandfather and mother; Heart attack in her father; Hypertension in her brother and mother; Kidney disease in her mother; Stroke in her maternal grandmother.  ROS:   Please see the history of present illness.    All other systems reviewed and are negative.   Prior CV studies:   The following studies were reviewed today:  Echo 08/2017 Left ventricle: The cavity size was normal. Wall thickness was   normal. Systolic function was normal. The estimated ejection   fraction was in the range of 60% to 65%. Wall motion was normal;   there were no regional wall motion abnormalities. There was no   evidence of elevated ventricular filling pressure by Doppler   parameters. - Aortic valve: Trileaflet; mildly thickened, mildly calcified   leaflets. - Mitral valve: Calcified annulus. Mildly thickened leaflets .  Stress test 08/2017   Nuclear stress EF: 75%. The left ventricular ejection fraction is hyperdynamic (>65%).  The study is normal.  This is a low risk study.  Labs/Other Tests and Data Reviewed:    EKG:  No ECG reviewed.  Recent Labs: 03/21/2018: ALT 18 01/02/2019: BUN 16; Creatinine, Ser 0.96; Hemoglobin 13.8; Platelets 227; Potassium 4.0; Sodium 135   Recent Lipid Panel Lab Results  Component Value Date/Time   CHOL 120 08/26/2017 04:27 PM   TRIG 91 08/26/2017 04:27 PM   HDL 51 08/26/2017 04:27 PM   CHOLHDL 2.4 08/26/2017 04:27 PM   CHOLHDL 2.0 06/23/2016 08:14 AM   LDLCALC 51 08/26/2017 04:27 PM    Wt Readings from Last 3 Encounters:  02/02/19 122 lb (55.3 kg)  12/20/18 122 lb (55.3 kg)  11/18/18 120 lb (54.4 kg)     Objective:    Vital Signs:  BP (!) 109/53 (BP Location: Right Arm)   Pulse 60   Ht  5\' 5"  (1.651 m)   Wt 122 lb (55.3 kg)   BMI 20.30 kg/m    Well nourished, well developed female in no acute distress.  Good spirit.  Answer question appropriately.  Normal affect.  ASSESSMENT & PLAN:    1.  CAD -No angina.  Continue aspirin, statin and beta-blocker.  2.  Palpitation -Occurs randomly.  After long discussion, patient will get AliveCore EKG device.  Her daughter will help to set up.  Patient wants to defer monitor for now. Will give [place] call if worsening symptoms.   3. HTN - BP stable on current medication  4.Fatigue - Stable. R/o Arrhthymias.  COVID-19 Education: The signs and symptoms of COVID-19 were discussed with the patient and how to seek care for testing (follow up with PCP or arrange E-visit).  The importance of social distancing was discussed today.  Time:   Today, I have spent 20 minutes with the patient with telehealth technology discussing the above problems.     Medication Adjustments/Labs and Tests Ordered: Current medicines are reviewed at length with the patient today.  Concerns regarding medicines are outlined above.   Tests Ordered: No orders of  the defined types were placed in this encounter.   Medication Changes: No orders of the defined types were placed in this encounter.   Disposition:  Follow up in 4 month(s)  Signed, Leanor Kail, PA  02/02/2019 10:56 AM    [facility]

## 2019-02-02 NOTE — Patient Instructions (Signed)
AliveCor  FDA-cleared EKG at your fingertips. - [employer]   Agricultural engineer, [employer]. [email or web address]://[email or web address]   FDA-cleared, clinical grade mobile EKG monitor: Jodelle Red is the most clinically-validated mobile EKG used by the world's leading cardiac care medical professionals.  This may be useful in monitoring palpitations.  We do not have access to have them emailed and reviewed but will be glad to review while in the office.    Medication Instructions:  none If you need a refill on your cardiac medications before your next appointment, please call your pharmacy.   Lab work: none If you have labs (blood work) drawn today and your tests are completely normal, you will receive your results only by: Marland Kitchen MyChart Message (if you have MyChart) OR . A paper copy in the mail If you have any lab test that is abnormal or we need to change your treatment, we will call you to review the results.  Testing/Procedures: none  Follow-Up: 4 months with Dr Acie Fredrickson At [facility], you and your health needs are our priority.  As part of our continuing mission to provide you with exceptional heart care, we have created designated Provider Care Teams.  These Care Teams include your primary Cardiologist (physician) and Advanced Practice Providers (APPs -  Physician Assistants and Nurse Practitioners) who all work together to provide you with the care you need, when you need it.  Any Other Special Instructions Will Be Listed Below (If Applicable).

## 2019-02-02 NOTE — Telephone Encounter (Signed)
Virtual Visit Pre-Appointment Phone Call  Steps For Call:  1. Confirm consent - "In the setting of the current Covid19 crisis, you are scheduled for a (phone or video) visit with your provider on (date) at (time).  Just as we do with many in-office visits, in order for you to participate in this visit, we must obtain consent.  If you'd like, I can send this to your mychart (if signed up) or email for you to review.  Otherwise, I can obtain your verbal consent now.  All virtual visits are billed to your insurance company just like a normal visit would be.  By agreeing to a virtual visit, we'd like you to understand that the technology does not allow for your provider to perform an examination, and thus may limit your provider's ability to fully assess your condition.  Finally, though the technology is pretty good, we cannot assure that it will always work on either your or our end, and in the setting of a video visit, we may have to convert it to a phone-only visit.  In either situation, we cannot ensure that we have a secure connection.  Are you willing to proceed?" STAFF: Did the patient verbally acknowledge consent to telehealth visit? Document YES/NO here: YES  2. Confirm the BEST phone number to call the day of the visit by including in appointment notes  3. Give patient instructions for WebEx/MyChart download to smartphone as below or Doximity/Doxy.me if video visit (depending on what platform provider is using)  4. Advise patient to be prepared with their blood pressure, heart rate, weight, any heart rhythm information, their current medicines, and a piece of paper and pen handy for any instructions they may receive the day of their visit  5. Inform patient they will receive a phone call 15 minutes prior to their appointment time (may be from unknown caller ID) so they should be prepared to answer  6. Confirm that appointment type is correct in Epic appointment notes (VIDEO vs PHONE)      TELEPHONE CALL NOTE  Nancy [name] has been deemed a candidate for a follow-up tele-health visit to limit community exposure during the Covid-19 pandemic. I spoke with the patient via phone to ensure availability of phone/video source, confirm preferred email & phone number, and discuss instructions and expectations.  I reminded Nancy [name] to be prepared with any vital sign and/or heart rhythm information that could potentially be obtained via home monitoring, at the time of her visit. I reminded Nancy [name] to expect a phone call at the time of her visit if her visit.  Jeanann Lewandowsky, [place] 02/02/2019 6:26 AM   INSTRUCTIONS FOR DOWNLOADING THE WEBEX APP TO SMARTPHONE  - If Apple, ask patient to go to App Store and type in WebEx in the search bar. [place] [employer], the blue/green circle. If Android, go to [employer] and type in [employer] in the search bar. The app is free but as with any other app downloads, their phone may require them to verify saved payment information or Apple/Android password.  - The patient does NOT have to create an account. - On the day of the visit, the assist will walk the patient through joining the meeting with the meeting number/password.  INSTRUCTIONS FOR DOWNLOADING THE MYCHART APP TO SMARTPHONE  - The patient must first make sure to have activated MyChart and know their login information - If Apple, go to [employer] and type in  MyChart in the search bar and download the app. If Android, ask patient to go to [employer] and type in [place] in the search bar and download the app. The app is free but as with any other app downloads, their phone may require them to verify saved payment information or Apple/Android password.  - The patient will need to then log into the app with their MyChart username and password, and select [place] as their healthcare provider to link the account. When it is time for your visit, go to the MyChart  app, find appointments, and click Begin Video Visit. Be sure to Select Allow for your device to access the Microphone and Camera for your visit. You will then be connected, and your provider will be with you shortly.  **If they have any issues connecting, or need assistance please contact MyChart service desk [phone])**  **If using a computer, in order to ensure the best quality for their visit they will need to use either of the following Internet Browsers: [employer], or Google Chrome**  IF USING DOXIMITY or DOXY.ME - The patient will receive a link just prior to their visit, either by text or email (to be determined day of appointment depending on if it's doxy.me or Doximity).     FULL LENGTH CONSENT FOR TELE-HEALTH VISIT   I hereby voluntarily request, consent and authorize [facility] and its employed or contracted physicians, physician assistants, nurse practitioners or other licensed health care professionals (the Practitioner), to provide me with telemedicine health care services (the "Services") as deemed necessary by the treating Practitioner. I acknowledge and consent to receive the Services by the Practitioner via telemedicine. I understand that the telemedicine visit will involve communicating with the Practitioner through live audiovisual communication technology and the disclosure of certain medical information by electronic transmission. I acknowledge that I have been given the opportunity to request an in-person assessment or other available alternative prior to the telemedicine visit and am voluntarily participating in the telemedicine visit.  I understand that I have the right to withhold or withdraw my consent to the use of telemedicine in the course of my care at any time, without affecting my right to future care or treatment, and that the Practitioner or I may terminate the telemedicine visit at any time. I understand that I have the right to inspect all  information obtained and/or recorded in the course of the telemedicine visit and may receive copies of available information for a reasonable fee.  I understand that some of the potential risks of receiving the Services via telemedicine include:  Marland Kitchen Delay or interruption in medical evaluation due to technological equipment failure or disruption; . Information transmitted may not be sufficient (e.g. poor resolution of images) to allow for appropriate medical decision making by the Practitioner; and/or  . In rare instances, security protocols could fail, causing a breach of personal health information.  Furthermore, I acknowledge that it is my responsibility to provide information about my medical history, conditions and care that is complete and accurate to the best of my ability. I acknowledge that Practitioner's advice, recommendations, and/or decision may be based on factors not within their control, such as incomplete or inaccurate data provided by me or distortions of diagnostic images or specimens that may result from electronic transmissions. I understand that the practice of medicine is not an exact science and that Practitioner makes no warranties or guarantees regarding treatment outcomes. I acknowledge that I will receive a copy  of this consent concurrently upon execution via email to the email address I last provided but may also request a printed copy by calling the office of [facility].    I understand that my insurance will be billed for this visit.   I have read or had this consent read to me. . I understand the contents of this consent, which adequately explains the benefits and risks of the Services being provided via telemedicine.  . I have been provided ample opportunity to ask questions regarding this consent and the Services and have had my questions answered to my satisfaction. . I give my informed consent for the services to be provided through the use of telemedicine in my  medical care  By participating in this telemedicine visit I agree to the above.

## 2019-02-21 ENCOUNTER — Other Ambulatory Visit: Payer: Self-pay | Admitting: Cardiovascular Disease

## 2019-03-21 ENCOUNTER — Other Ambulatory Visit: Payer: Self-pay | Admitting: Family Medicine

## 2019-03-22 NOTE — Telephone Encounter (Signed)
Pt is requesting refill on Xanax   LOV: 12/20/18  LRF:   12/20/18

## 2019-04-04 ENCOUNTER — Other Ambulatory Visit: Payer: Self-pay | Admitting: Family Medicine

## 2019-05-23 ENCOUNTER — Other Ambulatory Visit: Payer: Self-pay | Admitting: Family Medicine

## 2019-05-23 ENCOUNTER — Other Ambulatory Visit: Payer: Self-pay | Admitting: Cardiovascular Disease

## 2019-06-09 ENCOUNTER — Other Ambulatory Visit: Payer: Self-pay

## 2019-06-12 ENCOUNTER — Encounter: Payer: Self-pay | Admitting: Family Medicine

## 2019-06-12 ENCOUNTER — Ambulatory Visit (INDEPENDENT_AMBULATORY_CARE_PROVIDER_SITE_OTHER): Payer: Medicare Other | Admitting: Family Medicine

## 2019-06-12 VITALS — BP 130/50 | HR 58 | Temp 98.3°F | Resp 12 | Ht 65.0 in | Wt 118.0 lb

## 2019-06-12 DIAGNOSIS — I2510 Atherosclerotic heart disease of native coronary artery without angina pectoris: Secondary | ICD-10-CM | POA: Diagnosis not present

## 2019-06-12 DIAGNOSIS — R5382 Chronic fatigue, unspecified: Secondary | ICD-10-CM

## 2019-06-12 DIAGNOSIS — R6889 Other general symptoms and signs: Secondary | ICD-10-CM | POA: Diagnosis not present

## 2019-06-12 NOTE — Progress Notes (Signed)
Subjective:    Patient ID: Nancy [name], female    DOB: August 01, 1946, 73 y.o.   MRN: [id number]  HPI 11/03/18 Patient lost her husband December 1 due to lung cancer.  Since that time she had a difficult time sleeping.  She states that she goes to bed at 8:00 in the evening.  She will toss and turn for 2 hours.  She will then watch TV in the background until she finally drifts off usually 2 or 3 in the morning.  She will wake up 1 or 2 hours later and then she is unable to sleep.  This leaves her feeling extremely tired throughout the day.  She does have some mild age-related memory problems.  These have not worsened.  She denies depression although she is obviously grieving.  She denies any suicidal ideation.  She is interested in something that may help her fall asleep there would be non-habit-forming and hopefully also not exacerbate her mild underlying memory problems.  At that time, my plan was: We will try trazodone 25 to 50 mg p.o. nightly as needed insomnia.  We also discussed meditation as a means to help calm anxiety.  Reassess in 1 to 2 weeks  12/20/18 Patient states that the trazodone is not helpful.  She is unable to sleep.  This is now on every night occurrence.  She tosses and turns all throughout the night.  She seldom gets more than 2 to 3 hours of sleep per night.  She always feels tired and restless in the morning.  She also has an ingrown toenail on her right foot.  The fifth toenail is ingrown on the medial portion.  The skin around the nail is erythematous and indurated and tender.  The patient is requesting removal however she does not want ablation of the nail matrix.  Previous ablation of the great toe nail matrix by a podiatrist in the past left a dystrophic appearing toenail that the patient does not like the cosmetic appearance.  She prefers to have the toenail removed and allow it to grow back naturally and see if it will grow back healthy. At that time, my plan was: The right  fifth toe was anesthetized using a digital block with 0.1% lidocaine without epinephrine.  A tourniquet was applied to the base of the toe.  A metal elevator was then used to separate the toe nail from the underlying nail bed.  The entire toenail was then removed with gentle traction using a pair of hemostats.  Neosporin was then applied to the nailbed.  The toe was wrapped with petroleum gauze and then covered with Coban.  Wound care was discussed.  Recheck in 1 week if no better recheck sooner if it becomes erythematous painful or swollen.  Regarding her insomnia, I would recommend trying Xanax 0.5 mg p.o. nightly as needed insomnia due to its short half-life.  Hopefully this will wear out of the patient symptoms so during the daytime it will not exacerbate her mild memory impairment or increase her risk of falls or dizziness.  06/12/19 Patient states that for the last 6 months she has felt extremely weak.  She also reports feeling very tired.  She states that she has no energy.  She states that her muscles hurt constantly.  When asked her to describe tired, she does not feel sleepy tired.  Instead she feels weak "tired".  She physically does not have the strength to do the things that she wants to do.  She has the desire to do the things she wants to do but her muscles wear out easily.  They also hurt constantly.  She denies any fever or chills or rash.  She denies any nausea or vomiting.  She does report trouble sleeping.  She denies depression although she has lost her husband.  She does live alone.  I am also concerned that her memory seems to be worsening.  Previously I had her on medicine to help with memory loss.  Today she became confused when I asked her to go to the lab and have blood drawn despite having done this many times before.  She did not know where to go or how to get there.  This is unlike this patient indefinitely seems to be a change from our previous encounters over the years. Past  Medical History:  Diagnosis Date  . Anxiety state, unspecified   . CHEST PAIN UNSPECIFIED   . COLONIC POLYPS, HX OF   . CORONARY ATHEROSCLEROSIS NATIVE CORONARY ARTERY   . DYSLIPIDEMIA   . Essential hypertension, benign   . Osteoporosis    Past Surgical History:  Procedure Laterality Date  . CORONARY ANGIOPLASTY WITH STENT PLACEMENT  2010   3 times  . CORONARY ARTERY BYPASS GRAFT  2009  . CRANIOTOMY  04/27/2012   Procedure: CRANIOTOMY HEMATOMA EVACUATION SUBDURAL;  Surgeon: Eustace Moore, MD;  Location: [facility];  Service: Neurosurgery;  Laterality: Right;  Right Frontal Burr hole for Subdural Hematoma  . NASAL SINUS SURGERY    . POLYPECTOMY     Current Outpatient Medications on File Prior to Visit  Medication Sig Dispense Refill  . ALPRAZolam (XANAX) 0.5 MG tablet TAKE 1 TABLET BY MOUTH AT BEDTIME AS NEEDED FOR SLEEP 30 tablet 0  . aspirin 81 MG tablet Take 81 mg by mouth daily.     . Cholecalciferol (VITAMIN D-3 PO) Take 1 tablet by mouth every other day.     . Coenzyme Q10 (CO Q 10 PO) Take 1 capsule by mouth every other day.    . diphenhydramine-acetaminophen (TYLENOL PM) 25-500 MG TABS Take 1 tablet by mouth at bedtime as needed. For insomnia/headache    . escitalopram (LEXAPRO) 20 MG tablet Take 1 tablet (20 mg total) by mouth daily. 90 tablet 3  . ezetimibe (ZETIA) 10 MG tablet Take 1 tablet by mouth once daily 90 tablet 2  . lidocaine (LIDODERM) 5 % Place 1 patch onto the skin daily. Remove & Discard patch within 12 hours or as directed by MD 30 patch 0  . losartan (COZAAR) 25 MG tablet Take 1 tablet by mouth once daily 90 tablet 2  . metoprolol succinate (TOPROL-XL) 25 MG 24 hr tablet Take 3 tablets by mouth once daily 90 tablet 3  . naproxen (NAPROSYN) 500 MG tablet Take 1 tablet (500 mg total) by mouth 2 (two) times daily. 30 tablet 0  . nitroGLYCERIN (NITROSTAT) 0.4 MG SL tablet DISSOLVE ONE TABLET UNDER THE TONGUE EVERY 5 MINUTES AS NEEDED FOR CHEST PAIN.  DO NOT  EXCEED A TOTAL OF 3 DOSES IN 15 MINUTES 75 tablet 2  . Omega-3 Fatty Acids (EQL OMEGA 3 FISH OIL) 1400 MG CAPS Take 1 tablet by mouth daily.    . rosuvastatin (CRESTOR) 10 MG tablet Take 1 tablet by mouth once daily 90 tablet 2  . traZODone (DESYREL) 50 MG tablet Take 0.5-1 tablets (25-50 mg total) by mouth at bedtime as needed for sleep. 30 tablet 3  .  vitamin E (VITAMIN E) 400 UNIT capsule Take 400 Units by mouth daily.     No current facility-administered medications on file prior to visit.     Allergies  Allergen Reactions  . Alendronate Shortness Of Breath and Swelling  . Atorvastatin Other (See Comments)    Muscle cramps  . Bactrim [Sulfamethoxazole-Trimethoprim] Other (See Comments)    Blisters/skin peeling, nausea  . Buprenorphine Hcl Nausea Only  . Morphine Nausea Only    OTHER  . Morphine And Related Nausea Only  . Prednisone Other (See Comments)    Unknown  . Sulfa Antibiotics Itching    Blisters/skin peeling  . Sulfasalazine Itching    Blisters/skin peeling  . Sulfonamide Derivatives Other (See Comments)    Blisters/skin peeling   Social History   Socioeconomic History  . Marital status: Widowed    Spouse name: Not on file  . Number of children: Not on file  . Years of education: Not on file  . Highest education level: Not on file  Occupational History  . Not on file  Social Needs  . Financial resource strain: Not on file  . Food insecurity    Worry: Not on file    Inability: Not on file  . Transportation needs    Medical: Not on file    Non-medical: Not on file  Tobacco Use  . Smoking status: Never Smoker  . Smokeless tobacco: Never Used  Substance and Sexual Activity  . Alcohol use: Yes    Comment: occasional glass wine  . Drug use: No  . Sexual activity: Not Currently    Partners: Male    Comment: 1ST INTERCOURSE- 31, PARTNERS- 1 , MARRIED - 12 YRS   Lifestyle  . Physical activity    Days per week: Not on file    Minutes per session: Not on  file  . Stress: Not on file  Relationships  . Social Herbalist on phone: Not on file    Gets together: Not on file    Attends religious service: Not on file    Active member of club or organization: Not on file    Attends meetings of clubs or organizations: Not on file    Relationship status: Not on file  . Intimate partner violence    Fear of current or ex partner: Not on file    Emotionally abused: Not on file    Physically abused: Not on file    Forced sexual activity: Not on file  Other Topics Concern  . Not on file  Social History Narrative   Pt lives in 2 story home with her husband, Jonni Sanger   Has 2 adult daughters, 2 granddaughters   12th grade education   Retired Charity fundraiser      Review of Systems  All other systems reviewed and are negative.      Objective:   Physical Exam Vitals signs reviewed.  Constitutional:      General: She is not in acute distress.    Appearance: Normal appearance. She is normal weight.  Cardiovascular:     Rate and Rhythm: Normal rate and regular rhythm.     Heart sounds: Normal heart sounds.  Pulmonary:     Effort: Pulmonary effort is normal.     Breath sounds: Normal breath sounds.  Neurological:     Mental Status: She is alert and oriented to person, place, and time. Mental status is at baseline.  Psychiatric:        Mood  and Affect: Mood normal.        Behavior: Behavior normal.        Thought Content: Thought content normal.        Judgment: Judgment normal.           Assessment & Plan:  The encounter diagnosis was Chronic fatigue. I have asked the patient to temporarily discontinue rosuvastatin as well as Zetia and then reassess the patient over the next 2 to 3 weeks to see if her muscle aches and fatigue improved.  Meanwhile I will check a CBC, CMP, TSH, and vitamin B12 to assess for any other potential causes of her fatigue.  Consider treating depression by switching Lexapro to Effexor if symptoms do not  improve.  Also consider switching Toprol if her fatigue does not improve.  Reassess in 2 to 3 weeks.

## 2019-06-13 LAB — COMPLETE METABOLIC PANEL WITH GFR
AG Ratio: 2 (calc) (ref 1.0–2.5)
ALT: 29 U/L (ref 6–29)
AST: 25 U/L (ref 10–35)
Albumin: 4.6 g/dL (ref 3.6–5.1)
Alkaline phosphatase (APISO): 54 U/L (ref 37–153)
BUN: 23 mg/dL (ref 7–25)
CO2: 29 mmol/L (ref 20–32)
Calcium: 9.9 mg/dL (ref 8.6–10.4)
Chloride: 103 mmol/L (ref 98–110)
Creat: 0.93 mg/dL (ref 0.60–0.93)
GFR, Est African American: 71 mL/min/{1.73_m2} (ref >=60)
GFR, Est Non African American: 61 mL/min/{1.73_m2} (ref >=60)
Globulin: 2.3 g/dL (calc) (ref 1.9–3.7)
Glucose, Bld: 94 mg/dL (ref 65–99)
Potassium: 4.5 mmol/L (ref 3.5–5.3)
Sodium: 140 mmol/L (ref 135–146)
Total Bilirubin: 0.4 mg/dL (ref 0.2–1.2)
Total Protein: 6.9 g/dL (ref 6.1–8.1)

## 2019-06-13 LAB — VITAMIN B12: Vitamin B-12: 478 pg/mL (ref 200–1100)

## 2019-06-13 LAB — CBC WITH DIFFERENTIAL/PLATELET
Absolute Monocytes: 602 cells/uL (ref 200–950)
Basophils Absolute: 91 cells/uL (ref 0–200)
Basophils Relative: 1.3 %
Eosinophils Absolute: 119 cells/uL (ref 15–500)
Eosinophils Relative: 1.7 %
HCT: 40.7 % (ref 35.0–45.0)
Hemoglobin: 13.8 g/dL (ref 11.7–15.5)
Lymphs Abs: 1652 cells/uL (ref 850–3900)
MCH: 31.4 pg (ref 27.0–33.0)
MCHC: 33.9 g/dL (ref 32.0–36.0)
MCV: 92.7 fL (ref 80.0–100.0)
MPV: 11.6 fL (ref 7.5–12.5)
Monocytes Relative: 8.6 %
Neutro Abs: 4536 cells/uL (ref 1500–7800)
Neutrophils Relative %: 64.8 %
Platelets: 209 10*3/uL (ref 140–400)
RBC: 4.39 10*6/uL (ref 3.80–5.10)
RDW: 12.3 % (ref 11.0–15.0)
Total Lymphocyte: 23.6 %
WBC: 7 10*3/uL (ref 3.8–10.8)

## 2019-06-13 LAB — TSH: TSH: 1.43 mIU/L (ref 0.40–4.50)

## 2019-06-19 ENCOUNTER — Other Ambulatory Visit: Payer: Self-pay

## 2019-06-19 ENCOUNTER — Ambulatory Visit (INDEPENDENT_AMBULATORY_CARE_PROVIDER_SITE_OTHER): Payer: Medicare Other | Admitting: Neurology

## 2019-06-19 ENCOUNTER — Encounter: Payer: Self-pay | Admitting: Neurology

## 2019-06-19 VITALS — BP 176/76 | HR 79 | Ht 65.0 in | Wt 119.0 lb

## 2019-06-19 DIAGNOSIS — I2510 Atherosclerotic heart disease of native coronary artery without angina pectoris: Secondary | ICD-10-CM | POA: Diagnosis not present

## 2019-06-19 DIAGNOSIS — G3184 Mild cognitive impairment, so stated: Secondary | ICD-10-CM

## 2019-06-19 MED ORDER — DONEPEZIL HCL 5 MG PO TABS: ORAL_TABLET | ORAL | 11 refills | Status: DC

## 2019-06-19 NOTE — Progress Notes (Signed)
NEUROLOGY FOLLOW UP OFFICE NOTE  ZACHARIA ARIF [id number] August 17, 1946  HISTORY OF PRESENT ILLNESS: I had the pleasure of seeing Yamel Gaughan in follow-up in the neurology clinic on 06/19/2019.  The patient was last seen 7 months ago for worsening memory. Her daughter is present to provide additional information.  MOCA score 23/30 in January 2020 (25/30 in June 2019). Since her last visit, she feels her memory is fine. Her husband passed away last 2023/10/26 and she has been overwhelmed. She lives alone and occasionally forgets her medications. Her daughter has been encouraging her to use her pillbox again. Her daughters started helping with bills after her husband passed away, her husband was doing finances and they tried to help her practice doing bills online but she could not do it by herself. She does not like this because she has no idea what is coming in. She denies getting lost driving, her daughter reminds her she got lost driving to her daughter's home one time. She has trouble focusing and finishing tasks, leaving what she was doing to go do another thing. Her daughter has also noticed she is not focused on their conversations. She has sleep difficulties, waking up every 2 hours then having difficulty going back to sleep. She has been taking Tylenol PM every night, Trazodone was not helping. She feels tired all the time. She seldom has headaches. For several days she did not feel very good when bending down and coming back up. Her dose of metoprolol was reduced. She states her mood depends on what happens, she gets tearful sometimes. She is taking Lexapro 20mg  daily without side effects. No falls.   History on Initial Assessment 04/06/2018: This is a pleasant 73 year old ambidextrous woman with a history of right chronic subdural hematoma s/p craniectomy in 2013, hyperlipidemia, hypertension, CAD, presenting for evaluation of worsening memory. She and her husband reports memory changes started  soon after her brain surgery in 2013, but have become more progressive and significantly noticeable in the past year. Soon after surgery, her husband noticed that she would have word-finding difficulties, mixing up [place] and [place]. Over the past year, the word-finding difficulties have worsened, she states she sometimes forgets her grandparents names, when she meant to say grandchildren. She got a shock around 1-1.5 years ago at the doctor's office when she could not remember her address and had to look at her drivers license. She loses things a lot at home, she has 9 pairs of reading glasses and always has a hard time finding them. She does not cook as much and denies leaving the stove on. She continues to manage bills and medications without significant difficulties, she occasionally forgets her medications when they are busy. She stopped driving after heart surgery years ago, she denied getting lost but felt afraid. She is able to bathe and dress independently. Over the past year, she has become quite anxious about minor things, sometimes the dog barking would make her very nervous. She has found taking Glynn helps with this. She has a prescription for Xanax but does not take it regularly. She was noted to be anxious at several points during the visit today.  She denies any regular headaches but has a light one today from running around. She has light dizziness when standing at times. She denies any diplopia, dysarthria/dysphagia, focal numbness/tingling/weakness, both feet are numb. She has low back pain and occasional neck pain. She has constipation. No anosmia, tremors, REM behavior disorder. Her  husband denies any paranoia or hallucinations. She does not sleep well, no wandering behavior. Her mother and maternal grandmother had memory issues. Aside from head injury in 2013 (hit her head getting up, leading to subdural hematoma), no other head injuries or falls. She rarely drinks alcohol.   She had an MRI brain with and without contrast in July 2019 which I personally reviewed, no acute changes, there was mild diffuse volume loss, minimal chronic microvascular disease.   PAST MEDICAL HISTORY: Past Medical History:  Diagnosis Date  . Anxiety state, unspecified   . CHEST PAIN UNSPECIFIED   . COLONIC POLYPS, HX OF   . CORONARY ATHEROSCLEROSIS NATIVE CORONARY ARTERY   . DYSLIPIDEMIA   . Essential hypertension, benign   . Osteoporosis     MEDICATIONS: Current Outpatient Medications on File Prior to Visit  Medication Sig Dispense Refill  . ALPRAZolam (XANAX) 0.5 MG tablet TAKE 1 TABLET BY MOUTH AT BEDTIME AS NEEDED FOR SLEEP 30 tablet 0  . aspirin 81 MG tablet Take 81 mg by mouth daily.     . Cholecalciferol (VITAMIN D-3 PO) Take 1 tablet by mouth every other day.     . Coenzyme Q10 (CO Q 10 PO) Take 1 capsule by mouth every other day.    . diphenhydramine-acetaminophen (TYLENOL PM) 25-500 MG TABS Take 1 tablet by mouth at bedtime as needed. For insomnia/headache    . escitalopram (LEXAPRO) 20 MG tablet Take 1 tablet (20 mg total) by mouth daily. 90 tablet 3  . ezetimibe (ZETIA) 10 MG tablet Take 1 tablet by mouth once daily 90 tablet 2  . lidocaine (LIDODERM) 5 % Place 1 patch onto the skin daily. Remove & Discard patch within 12 hours or as directed by MD 30 patch 0  . losartan (COZAAR) 25 MG tablet Take 1 tablet by mouth once daily 90 tablet 2  . metoprolol succinate (TOPROL-XL) 25 MG 24 hr tablet Take 3 tablets by mouth once daily 90 tablet 3  . naproxen (NAPROSYN) 500 MG tablet Take 1 tablet (500 mg total) by mouth 2 (two) times daily. 30 tablet 0  . nitroGLYCERIN (NITROSTAT) 0.4 MG SL tablet DISSOLVE ONE TABLET UNDER THE TONGUE EVERY 5 MINUTES AS NEEDED FOR CHEST PAIN.  DO NOT EXCEED A TOTAL OF 3 DOSES IN 15 MINUTES 75 tablet 2  . Omega-3 Fatty Acids (EQL OMEGA 3 FISH OIL) 1400 MG CAPS Take 1 tablet by mouth daily.    . rosuvastatin (CRESTOR) 10 MG tablet Take 1 tablet  by mouth once daily 90 tablet 2  . traZODone (DESYREL) 50 MG tablet Take 0.5-1 tablets (25-50 mg total) by mouth at bedtime as needed for sleep. 30 tablet 3  . vitamin E (VITAMIN E) 400 UNIT capsule Take 400 Units by mouth daily.     No current facility-administered medications on file prior to visit.     ALLERGIES: Allergies  Allergen Reactions  . Alendronate Shortness Of Breath and Swelling  . Atorvastatin Other (See Comments)    Muscle cramps  . Bactrim [Sulfamethoxazole-Trimethoprim] Other (See Comments)    Blisters/skin peeling, nausea  . Buprenorphine Hcl Nausea Only  . Morphine Nausea Only    OTHER  . Morphine And Related Nausea Only  . Prednisone Other (See Comments)    Unknown  . Sulfa Antibiotics Itching    Blisters/skin peeling  . Sulfasalazine Itching    Blisters/skin peeling  . Sulfonamide Derivatives Other (See Comments)    Blisters/skin peeling    FAMILY HISTORY: Family  History  Problem Relation Age of Onset  . Hypertension Mother   . Cancer Mother        COLON  . Kidney disease Mother   . Heart attack Father   . Hypertension Brother   . Stroke Maternal Grandmother   . Cancer Maternal Grandfather        INTESTINAL     SOCIAL HISTORY: Social History   Socioeconomic History  . Marital status: Widowed    Spouse name: Not on file  . Number of children: Not on file  . Years of education: Not on file  . Highest education level: Not on file  Occupational History  . Not on file  Social Needs  . Financial resource strain: Not on file  . Food insecurity    Worry: Not on file    Inability: Not on file  . Transportation needs    Medical: Not on file    Non-medical: Not on file  Tobacco Use  . Smoking status: Never Smoker  . Smokeless tobacco: Never Used  Substance and Sexual Activity  . Alcohol use: Yes    Comment: occasional glass wine  . Drug use: No  . Sexual activity: Not Currently    Partners: Male    Comment: 1ST INTERCOURSE- 64,  PARTNERS- 1 , MARRIED - 82 YRS   Lifestyle  . Physical activity    Days per week: Not on file    Minutes per session: Not on file  . Stress: Not on file  Relationships  . Social Herbalist on phone: Not on file    Gets together: Not on file    Attends religious service: Not on file    Active member of club or organization: Not on file    Attends meetings of clubs or organizations: Not on file    Relationship status: Not on file  . Intimate partner violence    Fear of current or ex partner: Not on file    Emotionally abused: Not on file    Physically abused: Not on file    Forced sexual activity: Not on file  Other Topics Concern  . Not on file  Social History Narrative   Pt lives in 2 story home with her husband, Jonni Sanger   Has 2 adult daughters, 2 granddaughters   12th grade education   Retired Charity fundraiser     REVIEW OF SYSTEMS: Constitutional: No fevers, chills, or sweats, no generalized fatigue, change in appetite Eyes: No visual changes, double vision, eye pain Ear, nose and throat: No hearing loss, ear pain, nasal congestion, sore throat Cardiovascular: No chest pain, palpitations Respiratory:  No shortness of breath at rest or with exertion, wheezes GastrointestinaI: No nausea, vomiting, diarrhea, abdominal pain, fecal incontinence Genitourinary:  No dysuria, urinary retention or frequency Musculoskeletal:  No neck pain, back pain Integumentary: No rash, pruritus, skin lesions Neurological: as above Psychiatric: + depression, insomnia, anxiety Endocrine: No palpitations, fatigue, diaphoresis, mood swings, change in appetite, change in weight, increased thirst Hematologic/Lymphatic:  No anemia, purpura, petechiae. Allergic/Immunologic: no itchy/runny eyes, nasal congestion, recent allergic reactions, rashes  PHYSICAL EXAM: Vitals:   06/19/19 1626  BP: (!) 176/76  Pulse: 79  SpO2: 97%   General: No acute distress Head:  Normocephalic/atraumatic  Skin/Extremities: No rash, no edema Neurological Exam: alert and oriented to person, place, and time. No aphasia or dysarthria. Fund of knowledge is appropriate.  Recent and remote memory are intact.  Attention and concentration are intact. Decreased fluency, more  difficulty with visuospatial/executive functioning.  Montreal Cognitive Assessment  06/19/2019 11/18/2018 04/06/2018  Visuospatial/ Executive (0/5) 3 4 5   Naming (0/3) 3 3 3   Attention: Read list of digits (0/2) 2 2 2   Attention: Read list of letters (0/1) 1 1 1   Attention: Serial 7 subtraction starting at 100 (0/3) 2 3 3   Language: Repeat phrase (0/2) 1 0 2  Language : Fluency (0/1) 0 0 0  Abstraction (0/2) 2 0 2  Delayed Recall (0/5) 3 4 1   Orientation (0/6) 6 5 6   Total 23 22 25   Adjusted Score (based on education) 24 23 -    Cranial nerves: Pupils equal, round, reactive to light.  Extraocular movements intact with no nystagmus. Visual fields full. Facial sensation intact. No facial asymmetry. Tongue, uvula, palate midline.  Motor: Bulk and tone normal, muscle strength 5/5 throughout with no pronator drift.  Sensation to light touch ntact.  No extinction to double simultaneous stimulation.Finger to nose testing intact.  Gait narrow-based and steady, able to tandem walk adequately.  Romberg negative.  IMPRESSION: This is a pleasant 73 yo ambidextrous woman with a history of right chronic subdural hematoma s/p craniectomy in 2013, hyperlipidemia, hypertension, CAD, with worsening memory. MRI brain no acute changes, there was mild diffuse atrophy. MOCA score today 24/30 (23/30 in January 2020, 25/30 in June 2019). Her daughters have started to notice more decline, needing more help with complex tasks. 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 She will be scheduled for Neurocognitive testing to help assess how much depression is playing a role. She is agreeable to starting Donepezil 5mg  1/2 tab daily for 2  weeks, then increase to 1 tablet daily. Side effects and expectations from medications discussed. She is agreeable to seeing a grief counselor and will discuss this with her PCP. We again discussed the importance of control of vascular risk factors, physical exercise, and brain stimulation exercises for brain health. Continue to monitor driving. Follow-up in 6 months, she knows to call for any changes.   Thank you for allowing me to participate in her care.  Please do not hesitate to call for any questions or concerns.  The duration of this appointment visit was 30 minutes of face-to-face time with the patient.  Greater than 50% of this time was spent in counseling, explanation of diagnosis, planning of further management, and coordination of care.   Ellouise Newer, M.D.   CC: Dr. Dennard Schaumann

## 2019-06-19 NOTE — Patient Instructions (Addendum)
1. Schedule Neurocognitive testing with Dr. Melvyn Novas. Our office will call you within a couple of weeks to schedule this. 2. Start Donepezil 5mg : take 1/2 tablet daily for 2 weeks, then increase to 1 tablet  3. Recommend asking Dr. Dennard Schaumann about counselors in the area 4. Follow-up in 6 months, call for any changes  FALL PRECAUTIONS: Be cautious when walking. Scan the area for obstacles that may increase the risk of trips and falls. When getting up in the mornings, sit up at the edge of the bed for a few minutes before getting out of bed. Consider elevating the bed at the head end to avoid drop of blood pressure when getting up. Walk always in a well-lit room (use night lights in the walls). Avoid area rugs or power cords from appliances in the middle of the walkways. Use a walker or a cane if necessary and consider physical therapy for balance exercise. Get your eyesight checked regularly.  FINANCIAL OVERSIGHT: Supervision, especially oversight when making financial decisions or transactions is also recommended.  HOME SAFETY: Consider the safety of the kitchen when operating appliances like stoves, microwave oven, and blender. Consider having supervision and share cooking responsibilities until no longer able to participate in those. Accidents with firearms and other hazards in the house should be identified and addressed as well.  DRIVING: Regarding driving, in patients with progressive memory problems, driving will be impaired. We advise to have someone else do the driving if trouble finding directions or if minor accidents are reported. Independent driving assessment is available to determine safety of driving.  ABILITY TO BE LEFT ALONE: If patient is unable to contact 911 operator, consider using [employer], or when the need is there, arrange for someone to stay with patients. Smoking is a fire hazard, consider supervision or cessation. Risk of wandering should be assessed by caregiver and if detected at  any point, supervision and safe proof recommendations should be instituted.  MEDICATION SUPERVISION: Inability to self-administer medication needs to be constantly addressed. Implement a mechanism to ensure safe administration of the medications.  RECOMMENDATIONS FOR ALL PATIENTS WITH MEMORY PROBLEMS: 1. Continue to exercise (Recommend 30 minutes of walking everyday, or 3 hours every week) 2. Increase social interactions - continue going to [employer] and enjoy social gatherings with friends and family 3. Eat healthy, avoid fried foods and eat more fruits and vegetables 4. Maintain adequate blood pressure, blood sugar, and blood cholesterol level. Reducing the risk of stroke and cardiovascular disease also helps promoting better memory. 5. Avoid stressful situations. Live a simple life and avoid aggravations. Organize your time and prepare for the next day in anticipation. 6. Sleep well, avoid any interruptions of sleep and avoid any distractions in the bedroom that may interfere with adequate sleep quality 7. Avoid sugar, avoid sweets as there is a strong link between excessive sugar intake, diabetes, and cognitive impairment The Mediterranean diet has been shown to help patients reduce the risk of progressive memory disorders and reduces cardiovascular risk. This includes eating fish, eat fruits and green leafy vegetables, nuts like almonds and hazelnuts, walnuts, and also use olive oil. Avoid fast foods and fried foods as much as possible. Avoid sweets and sugar as sugar use has been linked to worsening of memory function.  There is always a concern of gradual progression of memory problems. If this is the case, then we may need to adjust level of care according to patient needs. Support, both to the patient and caregiver, should then be put  into place.

## 2019-06-20 ENCOUNTER — Ambulatory Visit: Payer: Medicare Other | Admitting: Neurology

## 2019-07-05 ENCOUNTER — Encounter: Payer: Medicare Other | Admitting: Psychology

## 2019-07-12 ENCOUNTER — Encounter: Payer: Medicare Other | Admitting: Psychology

## 2019-07-18 ENCOUNTER — Ambulatory Visit: Payer: Medicare Other

## 2019-07-18 ENCOUNTER — Encounter: Payer: Self-pay | Admitting: Psychology

## 2019-07-18 ENCOUNTER — Other Ambulatory Visit: Payer: Self-pay

## 2019-07-18 ENCOUNTER — Ambulatory Visit (INDEPENDENT_AMBULATORY_CARE_PROVIDER_SITE_OTHER): Payer: Medicare Other | Admitting: Psychology

## 2019-07-18 DIAGNOSIS — G3184 Mild cognitive impairment, so stated: Secondary | ICD-10-CM

## 2019-07-18 DIAGNOSIS — R4189 Other symptoms and signs involving cognitive functions and awareness: Secondary | ICD-10-CM

## 2019-07-18 NOTE — Progress Notes (Signed)
NEUROPSYCHOLOGICAL EVALUATION [place] Department of Neurology  Reason for Referral:   Nancy [name] is a 73 y.o. Caucasian female referred by Ellouise Newer, M.D., to characterize her current cognitive functioning and assist with diagnostic clarity and treatment planning in the context of a history of subdural hematoma and subjective cognitive decline.  Assessment and Plan:   Clinical Impression(s): Overall, Nancy [name]'s pattern of performance is suggestive of cognitive difficulties primarily surrounding memory and expressive language. Additional weaknesses included basic attention and cognitive flexibility. Performance across domains of processing speed, complex attention, abstract reasoning, problem solving, receptive language, and visuospatial functioning were within normal limits. 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   Regarding etiology, I do have concerns regarding Alzheimer's disease given Nancy [name]'s largely amnestic memory profile and difficulties across semantic fluency and confrontation naming tasks. However, visuospatial abilities and executive functioning (outside of cognitive flexibility) were generally within normal limits, which is atypical of a traditional Alzheimer's disease presentation. It is also important to note the potential that language confounds (i.e., Nancy [name] was tested in [place] rather than her native language of [place]) may artificially diminish expressive language performance. It is also possible that language deficits stem from her history of a 2013 subdural hematoma given that this injury exhibited a large leftward midline shift and this is when these deficits first became noticeable. However, memory scores are greatly diminished relative to what might be expected given the location of this injury, suggesting  that this is not the primary culprit for overall cognitive decline. Continued medical monitoring will be important moving forward.  Factors which can maintain and exacerbate cognitive inefficiencies include mild levels of psychiatric distress (e.g., anxiety and depression) and ongoing sleep disruptions. It is possible that a combination of these factors negatively influence day-to-day cognitive difficulties which Nancy [name] has been experiencing presently.  Recommendations: Repeat neuropsychological evaluation in 12-18 months (or sooner if functional decline is noted) to assess the trajectory of future cognitive decline should it occur.  Nancy [name] and her primary physician may wish to discuss the pros and cons of a laboratory sleep study or other avenues to improve not only the duration of sleep Nancy [name] obtains, but the quality of her sleep as well.   It will be important for all medical providers working with Nancy [name] to provide instructions and recommendations in written format to reduce the need for reliance on memory abilities and enhance her opportunity for proper adherence.  It will be important for Nancy [name] to have another person with her when in situations where she may need to process information, weigh the pros and cons of different options, and make decisions, in order to ensure that she fully understands and recalls all information to be considered.  To address problems with fluctuating attention, she may wish to consider:   -Avoiding external distractions when needing to concentrate   -Limiting exposure to fast paced environments with multiple sensory demands   -Writing down complicated information and using checklists   -Attempting and completing one task at a time (i.e., no multi-tasking)   -Taking frequent breaks during the completion of steps/tasks to avoid fatigue   -Reducing the amount of information considered at one time  Review of Records:   Nancy [name] was seen by [facility]  Neurology Mar[place] KitchenEllouise Newer, M.D.) on 06/19/2019 for follow-up of worsening memory. At that time, Ms.  [name] described trouble with short-term memory and focusing on and finishing tasks, often leaving what she was doing to go perform something else. Her daughter also noted feeling that her mother is not focused on their conversations. Additionally, Ms. Sanantonio reported ongoing sleep difficulties, generally waking up every 2 hours and having difficulty going back to sleep. Trazodone was described as unhelpful in treating these symptoms. Mood was situation dependant; however, she acknowledged being tearful at times (her husband reportedly passed away this past 11/03/2023). Performance on a brief cognitive screening instrument (MOCA) was 24/30. Points were lost across the following domains: visuospatial/executive (3/5), serial 7's (2/3), verbal fluency (0/1), and delayed recall (3/5). This score was stable relative to prior performances in January 2020 (23/30) and June 2019 (25/30). Ultimately, Nancy [name] was referred for a comprehensive neuropsychological evaluation to characterize her cognitive abilities and to assist with diagnostic clarity and treatment planning.  Head CT on 04/27/2012 revealed a right subdural hematoma up to 57mm in thickness and associated with a leftward midline shift of 10mm, as well as a possible trapping of the left lateral ventricle. Head CT on 05/24/2012 revealed a near complete resolution of her right frontal subdural hemorrhage and midline shift. Brain MRI on 05/18/2018 revealed mild volume loss with progression relative to a 2015 scan (this was unavailable for review). Head CT on 01/03/2019 revealed generalized atrophy and findings of chronic ischemic microangiopathy without acute intracranial abnormality.   Past Medical History:  Diagnosis Date   Coronary atherosclerosis of native coronary artery    Dyslipidemia    Essential hypertension, benign    History of colonic polyps    Osteoporosis     Subdural hematoma ([place]) 04/27/2012    Past Surgical History:  Procedure Laterality Date   CORONARY ANGIOPLASTY WITH STENT PLACEMENT  2010   3 times   CORONARY ARTERY BYPASS GRAFT  2009   CRANIOTOMY  04/27/2012   Procedure: CRANIOTOMY HEMATOMA EVACUATION SUBDURAL;  Surgeon: Eustace Moore, MD;  Location: [place] [facility];  Service: Neurosurgery;  Laterality: Right;  Right Frontal Burr hole for Subdural Hematoma   NASAL SINUS SURGERY     POLYPECTOMY      Family History  Problem Relation Age of Onset   Hypertension Mother    Cancer Mother        Colon   Kidney disease Mother    Memory loss Mother    Heart attack Father    Hypertension Brother    Stroke Maternal Grandmother    Memory loss Maternal Grandmother    Cancer Maternal Grandfather        Intestinal     Current Outpatient Medications:    aspirin 81 MG tablet, Take 81 mg by mouth daily. , Disp: , Rfl:    Cholecalciferol (VITAMIN D-3 PO), Take 1 tablet by mouth every other day. , Disp: , Rfl:    diphenhydramine-acetaminophen (TYLENOL PM) 25-500 MG TABS, Take 1 tablet by mouth at bedtime as needed. For insomnia/headache, Disp: , Rfl:    donepezil (ARICEPT) 5 MG tablet, Take 1/2 tablet daily for 2 weeks, then increase to 1 tablet daily, Disp: 30 tablet, Rfl: 11   escitalopram (LEXAPRO) 20 MG tablet, Take 1 tablet (20 mg total) by mouth daily., Disp: 90 tablet, Rfl: 3   ezetimibe (ZETIA) 10 MG tablet, Take 1 tablet by mouth once daily, Disp: 90 tablet, Rfl: 2   losartan (COZAAR) 25 MG tablet, Take 1 tablet by mouth once daily, Disp: 90 tablet, Rfl: 2   metoprolol  succinate (TOPROL-XL) 25 MG 24 hr tablet, Take 3 tablets by mouth once daily, Disp: 90 tablet, Rfl: 3   Omega-3 Fatty Acids (EQL OMEGA 3 FISH OIL) 1400 MG CAPS, Take 1 tablet by mouth daily., Disp: , Rfl:    rosuvastatin (CRESTOR) 10 MG tablet, Take 1 tablet by mouth once daily, Disp: 90 tablet, Rfl: 2   vitamin E (VITAMIN E) 400 UNIT capsule, Take  400 Units by mouth daily., Disp: , Rfl:   Clinical Interview:   Cognitive Symptoms: Decreased short-term memory: Endorsed. Examples included misplacing objects around the home, trouble remembering details of previous conversations and names of familiar individuals, and forgetting her original intention when entering a room.  Decreased long-term memory: Denied. Decreased attention/concentration: Endorsed. Nancy [name] noted difficulties with maintaining her focus and losing her train of thought, as well as increased ease of distractibility. Reduced processing speed: Variable. Difficulties with executive functions: Denied. Difficulties with emotion regulation: Denied. Difficulties with receptive language: Denied. Difficulties with word finding: Endorsed. She reported occasional stuttering behaviors, as well as trouble with word finding.  Decreased visuoperceptual ability: Denied.  Trajectory of deficits: Deficits were said to first become noticeable following her right frontal subdural hematoma and subsequent craniotomy in 2013, with the most notable being word finding and the emergence of stuttering behaviors. Over time, these abilities improved; however, Nancy [name] noted that they never reached her pre-injury baseline. More acutely, she noted a distinct yet stable decline in her overall cognitive functioning over the past 6 months to 1 year.  Difficulties completing ADLs: Largely denied. She noted concerns with polypharmacy and planned to talk to her physicians about the necessity of her various medications. However, she denied difficulties with medication management. Records suggest that her daughters have been helping her with financial management since her husband passed away. While Ms. Fahmy denied difficulties driving, records also suggest one instance in which Ms. Bumstead got lost while driving to her daughter's home.  Additional Medical History: History of traumatic brain injury/concussion:  Endorsed. Ms. Carlee reported standing up quickly while cleaning underneath a table, ultimately striking her head. She experienced a right frontal subdural hematoma and underwent a craniotomy in 2013. No other head injuries were reported.  History of stroke: Denied. History of seizure activity: Denied. History of known exposure to toxins: Denied. Symptoms of chronic pain: Endorsed. Ms. Oswell described commonly awakening with muscle cramps in her legs, which are particularly painful. She also noted some shoulder and neck pain. Symptoms were generally described as manageable; although, she did state that they cause her to move slowly in the morning.  Experience of frequent headaches/migraines: Denied. Frequent instances of dizziness/vertigo: Denied unless standing quickly from a previously seated position.   Sensory changes: Ms. Cardosa reported some ongoing visual disturbances and the need to obtain/update corrective lenses. Despite this, she did not report any difficulties reading. Other sensory changes/difficulties (e.g., hearing, taste, or smell) were denied.  Balance/coordination difficulties: Denied. Other motor difficulties: Denied.  Sleep History: Estimated hours obtained each night: 3-5 hours. Difficulties falling asleep: Endorsed. However, these have recently been improved via oral medications. Difficulties staying asleep: Endorsed. Ms. Quast reported commonly waking up between 3:00-4:00am for largely unknown reasons and exhibits difficulties falling back asleep.  Feels rested and refreshed upon awakening: Denied. She described generally feeling "heavy" and quickly experiencing fatigue throughout the day.  History of snoring: Denied. History of waking up gasping for air: Denied. Witnessed breath cessation while asleep: Denied.  History of vivid dreaming: Denied. Excessive movement  while asleep: Denied. Instances of acting out her dreams: Denied.  Psychiatric/Behavioral Health  History: Depression: Denied. She did acknowledge that "other people" (presumably medical professionals) have previously told her that she has depression. However, she denied these accounts, stating that periods of depressed mood were related to the death of her husband 31-Oct-2018), as well as the recent passing of her dog (during the previous week). She reported being on a mood-related medication currently, but did not discern any noticeable benefit. Current or remote suicidal ideation, intent, or plan was denied. Anxiety: Denied. Similar to what is described above, she acknowledged being told that she experiences anxiety from other individuals or medical professionals. For example, medical records suggest an increase in anxiety over the past year, as well as observed symptoms of anxiety during her prior visit with her neurologist. However, she largely denied experiencing symptoms of anxiety.  Mania: Denied. Trauma History: Denied. Visual/auditory hallucinations: Denied. Delusional thoughts: Denied.  Tobacco: Denied. Alcohol: Ms. Andary denied current alcohol consumption, as well as a history of problematic alcohol use, abuse, or dependence. Recreational drugs: Denied. Caffeine: Endorsed. She reported consuming a cup of coffee in the morning, as well as a rare additional cup in the afternoon.  Academic/Vocational History: Highest level of educational attainment: Schooling was completed in [place]. Ms. Faris noted completing 10 years of formalized education in [place], equating to a high school diploma in the [place]. 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 Her primary language is [place]. However, she learned [place] while in [place] and is fluent.  History of developmental delay: Denied. History of grade repetition: Denied. History of class failures: Denied. Enrollment in special education courses: Denied. History of diagnosed specific learning disability:  Denied. History of ADHD: Denied.  Employment: Retired. Ms. Hibbert attempted to learn nursing while in [place], but did not meet age requirements at that time. 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  Evaluation Results:   Behavioral Observations: Nancy [name] was unaccompanied, arrived to her appointment on time, and was appropriately dressed and groomed. Observed gait and station were within normal limits. Gross motor functioning appeared intact upon informal observation and no abnormal movements (e.g., tremors) were noted. Her affect was generally relaxed and positive, but did range appropriately given the subject being discussed during the clinical interview or the task at hand during testing procedures. Spontaneous speech was generally fluent. However, notable word finding difficulties were observed during the clinical interview. Sustained attention was adequate throughout. Thought processes were coherent, organized, and normal in content. Task engagement was adequate and she persisted when challenged. Notably, Nancy [name] expressed symptoms of fatigue at the onset of testing procedures, attributing this to poor sleep the night before. This increased as the evaluation progressed. As such, the evaluation was shortened in response. Additionally, upon completion of the evaluation, Nancy [name] appeared to exhibit symptoms of confusion, where she vacillated between her belief that she needed to drive herself home and her knowledge that her daughter was waiting for her in the waiting room.   Adequacy of Effort: The validity of neuropsychological testing is limited by the extent to which the individual being tested may be assumed to have exerted adequate effort during testing. Ms. Upp expressed her intention to perform to the best of her abilities  and exhibited adequate task engagement and persistence. Scores  across stand-alone and embedded performance validity measures were within expectation. As such, the results of the current evaluation are believed to be a valid representation of Nancy [name]'s current cognitive functioning.  Test Results: Ms. Caronia was somewhat disoriented at the start of the current evaluation. Points were lost for her stating her incorrect age (1/2), her incorrect address (1/2), her incorrect phone number (2/3), the incorrect date (1/2), the incorrect day of the week (0/1), and the incorrect current location (1/2).  Intellectual abilities based upon educational and vocational attainment were estimated to be in the average range. Premorbid abilities were estimated to be within the below average range based upon a single-word reading test.   Processing speed was mildly variable, ranging from the below average to average normative ranges, and was generally within normal limits. Basic attention was well below average. More complex attention (e.g., working memory) was average. Performance across tasks assessing executive functions were variable. A weakness was exhibited across cognitive flexibility, while problem solving and abstract reasoning with generally within normal limits.  While not directly assessed, receptive language abilities are believed to be intact as Ms. Kaster did not exhibit any difficulties comprehending task instructions and answered all questions asked of her appropriately. Assessed expressive language (e.g., verbal fluency and confrontation naming) was exceptionally low.     Assessed visuospatial/visuoconstructional abilities were within normal limits.    Learning (i.e., encoding) of novel verbal and visual information was exceptionally low. Spontaneous delayed recall (i.e., retrieval) of previously learned information ranged from below average to exceptionally low normative ranges. Retention rates were poor  across memory measures and Ms. Henkels generally exhibited an amnestic profile. Performance across recognition tasks was likewise poor, suggesting limited evidence for appropriate information consolidation.   Results of emotional screening instruments suggested that recent symptoms of generalized anxiety were in the mild range, driven primarily by somatic concerns. Symptoms of depression were within normal limits. A screening instrument assessing recent sleep quality suggested the presence of moderate sleep dysfunction.  Tables of Scores:   Note: This summary of test scores accompanies the interpretive report and should not be considered in isolation without reference to the appropriate sections in the text. Descriptors are based on appropriate normative data and may be adjusted based on clinical judgment. The terms impaired and within normal limits (WNL) are used when a more specific level of functioning cannot be determined.       Effort Testing:   DESCRIPTOR       Dot Counting Test: --- --- Within Expectation  CVLT-III Brief Form Forced Choice Recognition: --- --- Within Expectation  BVMT-R Retention Percentage: --- --- Within Expectation       Orientation:      Raw Score Percentile   NAB Orientation, Form 1 23/29 <1 Exceptionally Low       Intellectual Functioning:           Standard Score Percentile   Test of Premorbid Functioning (TOPF): 86 18 Below Average       Memory:          [place] Verbal Learning Test (CVLT-III) Brief Form: Raw Score (Scaled/Standard Score) Percentile     Total Trials 1-4 13/36 (60) <1 Exceptionally Low    Short-Delay Free Recall 5/9 (6) 9 Below Average    Long-Delay Free Recall 0/9 (1) <1 Exceptionally Low    Long-Delay Cued Recall 0/9 (1) <1 Exceptionally Low      Recognition Hits 8/9 (10) 50 Average      False Positive Errors  10 (1) <1 Exceptionally Low       Brief Visuospatial Memory Test (BVMT-R),  Form 1: Raw Score  T Score) Percentile      Total Trials 1-3 5/36 (22) <1 Exceptionally Low    Delayed Recall 2/12 (25) 1 Exceptionally Low    Recognition Discrimination Index 4 6-10 Well Below Average      Recognition Hits 6/6 >16 Within Normal Limits      False Positive Errors 2 <1 Exceptionally Low        Attention/Executive Function:          Trail Making Test (TMT): Raw Score (T Score) Percentile     Part A 49 secs.,  0 errors (41) 18 Below Average    Part B 200 secs.,  0 errors (36) 8 Well Below Average        Scaled Score Percentile   WAIS-IV Coding: 8 25 Average       NAB Attention Module, Form 1: T Score Percentile     Digits Forward 32 4 Well Below Average    Digits Backwards 46 34 Average       D-KEFS 20 Questions Test: Scaled Score Percentile     Total Weighted Achievement Score 13 84 Above Average    Initial Abstraction Score 10 50 Average       Language:          Verbal Fluency Test: Raw Score (T Score) Percentile     Phonemic Fluency (FAS) 11 (22) <1 Exceptionally Low    Animal Fluency 9 (28) 2 Exceptionally Low             NAB Language Module, Form 2: T Score Percentile     Naming 19 <1 Exceptionally Low       Visuospatial/Visuoconstruction:          NAB Spatial Module, Form 2: T Score Percentile     Figure Drawing Copy 57 75 Above Average    Figure Drawing Immediate Recall 60 84 Above Average        Scaled Score Percentile   WAIS-IV Matrix Reasoning: 8 25 Average       Mood and Personality:      Raw Score Percentile   Geriatric Depression Scale: 6 --- Within Normal Limits  Geriatric Anxiety Scale: 13 --- Mild    Somatic 9 --- Mild    Cognitive 2 --- Minimal    Affective 2 --- Minimal       Additional Questionnaires:      Raw Score Percentile   PROMIS Sleep Disturbance Questionnaire: 34 --- Moderate   Informed Consent and Coding/Compliance:   Ms. Watton was provided with a verbal description of the nature and purpose of the present neuropsychological evaluation. Also reviewed were the  foreseeable risks and/or discomforts and benefits of the procedure, limits of confidentiality, and mandatory reporting requirements of this provider. The patient was given the opportunity to ask questions and receive answers about the evaluation. Oral consent to participate was provided by the patient.   This evaluation was conducted by Christia Reading, Ph.D., licensed clinical neuropsychologist. Ms. Luff completed a 30-minute clinical interview, billed as one unit [phone], and 115 minutes of cognitive testing, billed as one unit [phone] and three additional units [phone]. Psychometrist Cruzita Lederer, B.S., assisted Dr. Melvyn Novas with test administration and scoring procedures. As a separate and discrete service, Dr. Melvyn Novas spent a total of 180 minutes in interpretation and report writing, billed as one unit 96132 and two units [id number].

## 2019-07-18 NOTE — Progress Notes (Signed)
   Neuropsychology Note   Nancy [name] completed 100 minutes of neuropsychological testing with technician, Cruzita Lederer, B.S., under the supervision of Dr. Christia Reading, Ph.D., licensed neuropsychologist. The patient did not appear overtly distressed by the testing session, per behavioral observation or via self-report to the technician. Rest breaks were offered.    In considering the patient's current level of functioning, level of presumed impairment, nature of symptoms, emotional and behavioral responses during the interview, level of literacy, and observed level of motivation/effort, a battery of tests was selected and communicated to the psychometrician.   Communication between the psychologist and technician was ongoing throughout the testing session and changes were made as deemed necessary based on patient performance on testing, technician observations and additional pertinent factors such as those listed above.   Nancy [name] will return within approximately two weeks for an interactive feedback session with Dr. Melvyn Novas at which time his test performances, clinical impressions, and treatment recommendations will be reviewed in detail. The patient understands she can contact our office should she require our assistance before this time.   Full report to follow.  100 minutes were spent face-to-face with patient administering standardized tests and 15 minutes were spent scoring (technician). [CPT [id number], [id number]

## 2019-07-19 ENCOUNTER — Encounter: Payer: Self-pay | Admitting: Psychology

## 2019-07-19 DIAGNOSIS — G3184 Mild cognitive impairment, so stated: Secondary | ICD-10-CM | POA: Insufficient documentation

## 2019-07-21 ENCOUNTER — Encounter: Payer: Self-pay | Admitting: Nurse Practitioner

## 2019-07-21 ENCOUNTER — Ambulatory Visit (INDEPENDENT_AMBULATORY_CARE_PROVIDER_SITE_OTHER): Payer: Medicare Other | Admitting: Cardiovascular Disease

## 2019-07-21 ENCOUNTER — Other Ambulatory Visit: Payer: Self-pay

## 2019-07-21 ENCOUNTER — Encounter: Payer: Self-pay | Admitting: Cardiovascular Disease

## 2019-07-21 VITALS — BP 92/48 | HR 68 | Ht 64.0 in | Wt 119.0 lb

## 2019-07-21 DIAGNOSIS — I25118 Atherosclerotic heart disease of native coronary artery with other forms of angina pectoris: Secondary | ICD-10-CM

## 2019-07-21 DIAGNOSIS — E782 Mixed hyperlipidemia: Secondary | ICD-10-CM | POA: Diagnosis not present

## 2019-07-21 DIAGNOSIS — R079 Chest pain, unspecified: Secondary | ICD-10-CM | POA: Diagnosis not present

## 2019-07-21 DIAGNOSIS — I2510 Atherosclerotic heart disease of native coronary artery without angina pectoris: Secondary | ICD-10-CM

## 2019-07-21 MED ORDER — METOPROLOL SUCCINATE ER 25 MG PO TB24: 25.0000 mg | ORAL_TABLET | Freq: Every day | ORAL | 3 refills | Status: DC

## 2019-07-21 MED ORDER — NITROGLYCERIN 0.4 MG SL SUBL: 0.4000 mg | SUBLINGUAL_TABLET | SUBLINGUAL | 6 refills | Status: DC | PRN

## 2019-07-21 NOTE — Progress Notes (Signed)
Cardiology Office Note   Date:  07/21/2019   ID:  Nancy, [name] March 27, 1946, MRN [id number]  PCP:  Susy Frizzle, MD  Cardiologist:   Mertie Moores, MD   No chief complaint on file.  1. Coronary artery disease-status post coronary artery bypass graft- 2008 Status post PCI in 2010. 2. craniotomy for subdural hematoma. 3.  .  hyperlipidemia-    Nancy [name] is doing well from a cardiac standpoint.  She is having lots of leg cramps. Takes Tylenol PM each night for cramps. She's tried taking mustard and this does not help. Her atorvastatin dose has been cut in 1/2 but she continues to have joint pain, muscle ache. She also has numbness in her feet,  Has lots of ankle, knee and hip pain    January 03, 2015:   Nancy [name] is a 73 y.o. female who presents for follow-up of her coronary artery disease.  Sept. 13, 2016:  Doing well.  Normal stress myoview in March , 2016, Lipids looked great  Feeling well.  Not sleeping well.   January 07, 2106:  Has had some palpitations when she lies on her left side to sleep  Has worn the monitor in the past Does not want to necessarily wear it again  Has bilateral hand splints on - arthritis   No CP or dyspnea. Has slowed down with her walking  - walking slower and perhaps not as far.   Sept. 18, 2017  Has occasional irreg HR when she lays down on her left side.   Able to do her normal activities without cardiac problems . No CP or dyspnea . Lots of arthritis symptoms . Has some memory loss following her subdural hematoma  January 04, 2017:  Multiple concerns / complaints today   Has lots of muscle pains  , joint pain , Arthritis  Brief Dizziness when she stands up  Constipation Fatigues easily    No angina , Very rare episodes of chest tightness.  Has stopped some of her OTC meds ( fish oil, Vit B6, )   August 26, 2018:  Nancy [name] is having fatigue  - more fatigue and shortness of breath with  exertion  Very similar to prior to her CABG  Has occasional palpitations   April 11, 2018:   Nancy [name] is seen today for follow up of coronary artery disease. She was having some shortness of breath symptoms were very similar to her previous episodes of angina. Myoview study in November, 2018 was low risk study with no evidence of ischemia.  Echocardiogram was normal.  Has been having some palpitations recently .   Have resolved today Does not have much energy .   July 21, 2019:  Nancy [name] is seen today for follow-up visit.  She has a history of coronary artery disease.  She seen with her daughter , [name] Has some dizziness when she turns and orthostasis with standing up .   Has occasional chest pressure when she wakes up .   Past Medical History:  Diagnosis Date  . Coronary atherosclerosis of native coronary artery   . Dyslipidemia   . Essential hypertension, benign   . History of colonic polyps   . Osteoporosis   . Subdural hematoma ([place]) 04/27/2012    Past Surgical History:  Procedure Laterality Date  . CORONARY ANGIOPLASTY WITH STENT PLACEMENT  2010   3 times  . CORONARY ARTERY BYPASS GRAFT  2009  . CRANIOTOMY  04/27/2012   Procedure: CRANIOTOMY HEMATOMA  EVACUATION SUBDURAL;  Surgeon: Eustace Moore, MD;  Location: [facility];  Service: Neurosurgery;  Laterality: Right;  Right Frontal Burr hole for Subdural Hematoma  . NASAL SINUS SURGERY    . POLYPECTOMY       Current Outpatient Medications  Medication Sig Dispense Refill  . aspirin 81 MG tablet Take 81 mg by mouth daily.     . Cholecalciferol (VITAMIN D-3 PO) Take 1 tablet by mouth every other day.     . diphenhydramine-acetaminophen (TYLENOL PM) 25-500 MG TABS Take 1 tablet by mouth at bedtime as needed. For insomnia/headache    . donepezil (ARICEPT) 5 MG tablet Take 1/2 tablet daily for 2 weeks, then increase to 1 tablet daily 30 tablet 11  . escitalopram (LEXAPRO) 20 MG tablet Take 1 tablet (20 mg total) by  mouth daily. 90 tablet 3  . ezetimibe (ZETIA) 10 MG tablet Take 1 tablet by mouth once daily 90 tablet 2  . losartan (COZAAR) 25 MG tablet Take 1 tablet by mouth once daily 90 tablet 2  . metoprolol succinate (TOPROL-XL) 25 MG 24 hr tablet Take 3 tablets by mouth once daily 90 tablet 3  . Omega-3 Fatty Acids (EQL OMEGA 3 FISH OIL) 1400 MG CAPS Take 1 tablet by mouth daily.    . rosuvastatin (CRESTOR) 10 MG tablet Take 1 tablet by mouth once daily 90 tablet 2  . vitamin E (VITAMIN E) 400 UNIT capsule Take 400 Units by mouth daily.     No current facility-administered medications for this visit.     Allergies:   Alendronate, Atorvastatin, Bactrim [sulfamethoxazole-trimethoprim], Buprenorphine hcl, Morphine, Morphine and related, Prednisone, Sulfa antibiotics, Sulfasalazine, and Sulfonamide derivatives    Social History:  The patient  reports that she has never smoked. She has never used smokeless tobacco. She reports current alcohol use. She reports that she does not use drugs.   Family History:  The patient's family history includes Cancer in her maternal grandfather and mother; Heart attack in her father; Hypertension in her brother and mother; Kidney disease in her mother; Memory loss in her maternal grandmother and mother; Stroke in her maternal grandmother.    ROS:  Please see the history of present illness.  Reviewed and current history.  Otherwise negative.   Physical Exam: Blood pressure (!) 92/48, pulse 68, height 5\' 4"  (1.626 m), weight 119 lb (54 kg), SpO2 97 %.  GEN:  Well nourished, well developed in no acute distress HEENT: Normal NECK: No JVD; No carotid bruits LYMPHATICS: No lymphadenopathy CARDIAC: RRR , no murmurs, rubs, gallops RESPIRATORY:  Clear to auscultation without rales, wheezing or rhonchi  ABDOMEN: Soft, non-tender, non-distended MUSCULOSKELETAL:  No edema; No deformity  SKIN: Warm and dry NEUROLOGIC:  Alert and oriented x 3   EKG: July 21, 2019:  Normal sinus rhythm at 68.  Sinus arrhythmia.  Recent Labs: 06/12/2019: ALT 29; BUN 23; Creat 0.93; Hemoglobin 13.8; Platelets 209; Potassium 4.5; Sodium 140; TSH 1.43    Lipid Panel    Component Value Date/Time   CHOL 120 08/26/2017 1627   TRIG 91 08/26/2017 1627   HDL 51 08/26/2017 1627   CHOLHDL 2.4 08/26/2017 1627   CHOLHDL 2.0 06/23/2016 0814   VLDL 14 06/23/2016 0814   LDLCALC 51 08/26/2017 1627     Wt Readings from Last 3 Encounters:  07/21/19 119 lb (54 kg)  06/19/19 119 lb (54 kg)  06/12/19 118 lb (53.5 kg)     Other studies Reviewed: Additional studies/ records  that were reviewed today include: . Review of the above records demonstrates:    ASSESSMENT AND PLAN:  1. Coronary artery disease-status post coronary artery bypass graft- 2008 Status post PCI in 2010.   She is having some chest pain particularly in the morning.  It is difficult to tell if she is just waking up with a cramp versus having angina.  She describes a tightness in her chest which is relieved with nitroglycerin.  We will schedule her for a Lexiscan  Myoview study.  2. craniotomy for subdural hematoma.  3.   hyperlipidemia-      she was on Crestor and Zetia.  She is started developing some leg discomfort.  She stopped the cholesterol medications and the leg aches seem to have gotten better.  Her last lipid levels were drawn in November, 2018 and they were ideal.  We will we will refer her to lipid clinic for further evaluation.  4.  Fatigue:    I.  Orthostatic hypotension.  She is having lots of problems with orthostatic hypotension.  We will discontinue the losartan.  We will reduce the Toprol-XL to 25 mg a day.  Labs/ tests ordered today include:  No orders of the defined types were placed in this encounter.   Disposition:   FU with me in 6 months with fasting labs.    Signed, Mertie Moores, MD  07/21/2019 2:48 PM    [address],  [place], [place]  [address] Phone: [phone]; Fax: [phone]

## 2019-07-21 NOTE — Patient Instructions (Addendum)
Medication Instructions:  Your physician has recommended you make the following change in your medication: DECREASE Metoprolol (Toprol) to 25 mg once daily STOP Losartan (Cozaar) STOP Rosuvastatin (Crestor)  If you need a refill on your cardiac medications before your next appointment, please call your pharmacy.    Lab work: None Ordered   Testing/Procedures: Your physician has requested that you have a lexiscan myoview. For further information please visit [email or web address]. Please follow instruction sheet, as given.    Follow-Up: You have been referred to Lipid Clinic for management of your high cholesterol and intolerance of medication   At [facility], you and your health needs are our priority.  As part of our continuing mission to provide you with exceptional heart care, we have created designated Provider Care Teams.  These Care Teams include your primary Cardiologist (physician) and Advanced Practice Providers (APPs -  Physician Assistants and Nurse Practitioners) who all work together to provide you with the care you need, when you need it. You will need a follow up appointment in:  3 months.  You may see Mertie Moores, MD or one of the following Advanced Practice Providers on your designated Care Team: Richardson Dopp, PA-C [place], [place] . Daune Perch, NP

## 2019-07-26 ENCOUNTER — Ambulatory Visit (INDEPENDENT_AMBULATORY_CARE_PROVIDER_SITE_OTHER): Payer: Medicare Other | Admitting: Psychology

## 2019-07-26 ENCOUNTER — Other Ambulatory Visit: Payer: Self-pay

## 2019-07-26 ENCOUNTER — Encounter: Payer: Self-pay | Admitting: Psychology

## 2019-07-26 DIAGNOSIS — G3184 Mild cognitive impairment, so stated: Secondary | ICD-10-CM

## 2019-07-26 NOTE — Progress Notes (Signed)
   NEUROPSYCHOLOGICAL EVALUATION - Feedback [place]. [facility] Department of Neurology  Reason for Referral:   Nancy [name] a 73 y.o. Caucasian female referred by Ellouise Newer, M.D.,to characterize hercurrent cognitive functioning and assist with diagnostic clarity and treatment planning in the context of a history of subdural hematoma and subjective cognitive decline.  Feedback:   Nancy [name] completed a comprehensive neuropsychological evaluation on 07/18/2019. Briefly, results suggested cognitive difficulties primarily surrounding memory and expressive language. Additional weaknesses included basic attention and cognitive flexibility. 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 Regarding etiology, I do have concerns regarding Alzheimer's disease given Nancy [name]'s largely amnestic memory profile and difficulties across semantic fluency and confrontation naming tasks. However, visuospatial abilities and executive functioning (outside of cognitive flexibility) were generally within normal limits, which is atypical of a traditional Alzheimer's disease presentation. It is also important to note the potential that language confounds (i.e., Nancy [name] was tested in [place] rather than her native language of [place]) may artificially diminish expressive language performance. Recommendations included a repeat neuropsychological evaluation in 12-18 months and considerations for improving her overall quality of sleep.  Nancy [name] was accompanied by her daughter. Content of the current session focused on the results of her evaluation and potential etiologies of her deficits. Nancy [name] and her daughter were given the opportunity to ask questions and all their questions were answered. Nancy [name] was also encouraged to reach out should additional questions arise. A copy of her report was  provided at the conclusion of the visit.      A total of 20 minutes were spent with Nancy [name] during the current feedback session.

## 2019-07-27 ENCOUNTER — Encounter (HOSPITAL_COMMUNITY): Payer: Medicare Other

## 2019-07-27 ENCOUNTER — Telehealth (HOSPITAL_COMMUNITY): Payer: Self-pay

## 2019-07-27 NOTE — Telephone Encounter (Signed)
Spoke with the patient's daughter per the DPR. Left her instructions for her mom's test. Asked to call back with any questions. S.Kadra Kohan EMTP

## 2019-07-31 ENCOUNTER — Ambulatory Visit (INDEPENDENT_AMBULATORY_CARE_PROVIDER_SITE_OTHER): Payer: Medicare Other | Admitting: Internal Medicine

## 2019-07-31 ENCOUNTER — Other Ambulatory Visit: Payer: Self-pay

## 2019-07-31 VITALS — BP 142/62 | HR 79 | Ht 63.0 in | Wt 119.2 lb

## 2019-07-31 DIAGNOSIS — E785 Hyperlipidemia, unspecified: Secondary | ICD-10-CM | POA: Diagnosis not present

## 2019-07-31 DIAGNOSIS — Z789 Other specified health status: Secondary | ICD-10-CM

## 2019-07-31 DIAGNOSIS — I25118 Atherosclerotic heart disease of native coronary artery with other forms of angina pectoris: Secondary | ICD-10-CM

## 2019-07-31 DIAGNOSIS — I2510 Atherosclerotic heart disease of native coronary artery without angina pectoris: Secondary | ICD-10-CM

## 2019-07-31 LAB — LIPID PANEL
Chol/HDL Ratio: 2.6 ratio (ref 0.0–4.4)
Cholesterol, Total: 171 mg/dL (ref 100–199)
HDL: 66 mg/dL (ref >39)
LDL Chol Calc (NIH): 89 mg/dL (ref 0–99)
Triglycerides: 86 mg/dL (ref 0–149)
VLDL Cholesterol Cal: 16 mg/dL (ref 5–40)

## 2019-07-31 NOTE — Patient Instructions (Signed)
Medication Instructions:  Dr Debara Pickett recommends that you continue on your current medications as directed. Please refer to the Current Medication list given to you today.  If you need a refill on your cardiac medications before your next appointment, please call your pharmacy.   Lab work: Your physician recommends that you return for lab work TODAY.  If you have labs (blood work) drawn today and your tests are completely normal, you will receive your results only by: Marland Kitchen MyChart Message (if you have MyChart) OR . A paper copy in the mail If you have any lab test that is abnormal or we need to change your treatment, we will call you to review the results.  Follow-Up: At [facility], you and your health needs are our priority.  As part of our continuing mission to provide you with exceptional heart care, we have created designated Provider Care Teams.  These Care Teams include your primary Cardiologist (physician) and Advanced Practice Providers (APPs -  Physician Assistants and Nurse Practitioners) who all work together to provide you with the care you need, when you need it. You will need a follow up appointment in 3-4 months.  Please call our office 2 months in advance to schedule this appointment.  You may see Pixie Casino, MD or one of the following Advanced Practice Providers on your designated Care Team: [place], [place] . Fabian Sharp, PA-C . You will receive a reminder letter in the mail two months in advance. If you don't receive a letter, please call our office to schedule the follow-up appointment.

## 2019-08-01 ENCOUNTER — Ambulatory Visit (HOSPITAL_COMMUNITY): Payer: Medicare Other | Attending: Internal Medicine

## 2019-08-01 ENCOUNTER — Encounter: Payer: Self-pay | Admitting: Internal Medicine

## 2019-08-01 DIAGNOSIS — R079 Chest pain, unspecified: Secondary | ICD-10-CM | POA: Insufficient documentation

## 2019-08-01 DIAGNOSIS — E782 Mixed hyperlipidemia: Secondary | ICD-10-CM | POA: Insufficient documentation

## 2019-08-01 DIAGNOSIS — I25118 Atherosclerotic heart disease of native coronary artery with other forms of angina pectoris: Secondary | ICD-10-CM | POA: Diagnosis not present

## 2019-08-01 LAB — MYOCARDIAL PERFUSION IMAGING
LV dias vol: 49 mL (ref 46–106)
LV sys vol: 19 mL
Peak HR: 100 {beats}/min
Rest HR: 61 {beats}/min
SDS: 2
SRS: 0
SSS: 2
TID: 1.21

## 2019-08-01 MED ORDER — TECHNETIUM TC 99M TETROFOSMIN IV KIT
10.1000 | PACK | Freq: Once | INTRAVENOUS | Status: AC | PRN
  Administered 2019-08-01: 10.1 via INTRAVENOUS
  Filled 2019-08-01: qty 11

## 2019-08-01 MED ORDER — TECHNETIUM TC 99M TETROFOSMIN IV KIT
30.9000 | PACK | Freq: Once | INTRAVENOUS | Status: AC | PRN
  Administered 2019-08-01: 30.9 via INTRAVENOUS
  Filled 2019-08-01: qty 31

## 2019-08-01 MED ORDER — REGADENOSON 0.4 MG/5ML IV SOLN
0.4000 mg | Freq: Once | INTRAVENOUS | Status: AC
  Administered 2019-08-01: 0.4 mg via INTRAVENOUS

## 2019-08-01 NOTE — Progress Notes (Signed)
LIPID CLINIC CONSULT NOTE  Chief Complaint:  Manage dyslipidemia  Primary Care Physician: Susy Frizzle, MD  Primary Cardiologist:  Mertie Moores, MD  HPI:  Nancy [name] is a 73 y.o. female who is being seen today for the evaluation of dyslipidemia at the request of Nahser, Wonda Cheng, MD.  This is a pleasant 73 year old female with a history of coronary artery disease, dyslipidemia, hypertension and statin intolerance.  Recently she has been having some symptoms concerning for angina and saw Dr. Acie Fredrickson.  He ordered a stress test.  In addition he referred her to the lipid clinic for evaluation and possible therapeutic options.  She also needed updated lipid profile.  A year ago her total cholesterol is 120, triglycerides 91, HDL 51 and LDL was 51, however she was on some therapy at the time.  Since then she has discontinued all of her statin medications.  I did get a repeat lipid profile on her today which showed a expected increase in her total cholesterol 171, triglycerides 86, HDL 66 and LDL of 89.  Her goal LDL is less than 70.  PMHx:  Past Medical History:  Diagnosis Date  . Coronary atherosclerosis of native coronary artery   . Dyslipidemia   . Essential hypertension, benign   . History of colonic polyps   . Osteoporosis   . Subdural hematoma ([place]) 04/27/2012    Past Surgical History:  Procedure Laterality Date  . CORONARY ANGIOPLASTY WITH STENT PLACEMENT  2010   3 times  . CORONARY ARTERY BYPASS GRAFT  2009  . CRANIOTOMY  04/27/2012   Procedure: CRANIOTOMY HEMATOMA EVACUATION SUBDURAL;  Surgeon: Eustace Moore, MD;  Location: [facility];  Service: Neurosurgery;  Laterality: Right;  Right Frontal Burr hole for Subdural Hematoma  . NASAL SINUS SURGERY    . POLYPECTOMY      FAMHx:  Family History  Problem Relation Age of Onset  . Hypertension Mother   . Cancer Mother        Colon  . Kidney disease Mother   . Memory loss Mother   . Heart attack Father   .  Hypertension Brother   . Stroke Maternal Grandmother   . Memory loss Maternal Grandmother   . Cancer Maternal Grandfather        Intestinal    SOCHx:   reports that she has never smoked. She has never used smokeless tobacco. She reports current alcohol use. She reports that she does not use drugs.  ALLERGIES:  Allergies  Allergen Reactions  . Alendronate Shortness Of Breath and Swelling  . Atorvastatin Other (See Comments)    Muscle cramps  . Bactrim [Sulfamethoxazole-Trimethoprim] Other (See Comments)    Blisters/skin peeling, nausea  . Buprenorphine Hcl Nausea Only  . Morphine Nausea Only    OTHER  . Morphine And Related Nausea Only  . Prednisone Other (See Comments)    Unknown  . Sulfa Antibiotics Itching    Blisters/skin peeling  . Sulfasalazine Itching    Blisters/skin peeling  . Sulfonamide Derivatives Other (See Comments)    Blisters/skin peeling    ROS: Pertinent items noted in HPI and remainder of comprehensive ROS otherwise negative.  HOME MEDS: Current Outpatient Medications on File Prior to Visit  Medication Sig Dispense Refill  . aspirin 81 MG tablet Take 81 mg by mouth daily.     . Cholecalciferol (VITAMIN D-3 PO) Take 1 tablet by mouth every other day.     . diphenhydramine-acetaminophen (TYLENOL PM)  25-500 MG TABS Take 1 tablet by mouth at bedtime as needed. For insomnia/headache    . donepezil (ARICEPT) 5 MG tablet Take 1/2 tablet daily for 2 weeks, then increase to 1 tablet daily 30 tablet 11  . escitalopram (LEXAPRO) 20 MG tablet Take 1 tablet (20 mg total) by mouth daily. 90 tablet 3  . losartan (COZAAR) 25 MG tablet Take 1 tablet by mouth once daily 90 tablet 2  . metoprolol succinate (TOPROL XL) 25 MG 24 hr tablet Take 1 tablet (25 mg total) by mouth daily. 90 tablet 3  . nitroGLYCERIN (NITROSTAT) 0.4 MG SL tablet Place 1 tablet (0.4 mg total) under the tongue every 5 (five) minutes as needed for chest pain. 25 tablet 6  . Omega-3 Fatty Acids (EQL  OMEGA 3 FISH OIL) 1400 MG CAPS Take 1 tablet by mouth daily.    . vitamin E (VITAMIN E) 400 UNIT capsule Take 400 Units by mouth daily.     No current facility-administered medications on file prior to visit.     LABS/IMAGING: Results for orders placed or performed in visit on 07/31/19 (from the past 48 hour(s))  Lipid panel     Status: None   Collection Time: 07/31/19 10:55 AM  Result Value Ref Range   Cholesterol, Total 171 100 - 199 mg/dL   Triglycerides 86 0 - 149 mg/dL   HDL 66 >39 mg/dL   VLDL Cholesterol Cal 16 5 - 40 mg/dL   LDL Chol Calc (NIH) 89 0 - 99 mg/dL   Chol/HDL Ratio 2.6 0.0 - 4.4 ratio    Comment:                                   T. Chol/HDL Ratio                                             Men  Women                               1/2 Avg.Risk  3.4    3.3                                   Avg.Risk  5.0    4.4                                2X Avg.Risk  9.6    7.1                                3X Avg.Risk 23.4   11.0    No results found.  LIPID PANEL:    Component Value Date/Time   CHOL 171 07/31/2019 1055   TRIG 86 07/31/2019 1055   HDL 66 07/31/2019 1055   CHOLHDL 2.6 07/31/2019 1055   CHOLHDL 2.0 06/23/2016 0814   VLDL 14 06/23/2016 0814   LDLCALC 89 07/31/2019 1055    WEIGHTS: Wt Readings from Last 3 Encounters:  08/01/19 119 lb (54 kg)  07/31/19 119 lb 3.2 oz (54.1 kg)  07/21/19 119 lb (54  kg)    VITALS: BP (!) 142/62   Pulse 79   Ht 5\' 3"  (1.6 m)   Wt 119 lb 3.2 oz (54.1 kg)   SpO2 96%   BMI 21.12 kg/m   EXAM: General appearance: alert and no distress Neck: no carotid bruit, no JVD and thyroid not enlarged, symmetric, no tenderness/mass/nodules Lungs: clear to auscultation bilaterally Heart: regular rate and rhythm, S1, S2 normal, no murmur, click, rub or gallop Abdomen: soft, non-tender; bowel sounds normal; no masses,  no organomegaly Extremities: extremities normal, atraumatic, no cyanosis or edema Pulses: 2+ and symmetric  Skin: Skin color, texture, turgor normal. No rashes or lesions Neurologic: Mental status: Alert, oriented, thought content appropriate Psych: Pleasant  EKG: Deferred  ASSESSMENT: 1. Mixed dyslipidemia 2. Statin intolerance 3. History of coronary artery disease with prior CABG and PCI 4. Hypertension  PLAN: 1.   This is a pleasant 73 year old female with history of coronary disease and prior CABG and PCI.  She has been statin intolerant and recently discontinued Crestor and Zetia simultaneously due to leg discomfort.  If not clear which medication was the issue however it is more likely that the statin was the culprit.  Her lipids have accordingly gone up and she remains above goal LDL less than 70.  We discussed therapeutic options for her.  I am recommending we restart ezetimibe monotherapy 10 mg daily.  Hopefully this will be well-tolerated and should get her to target LDL less than 70.  Plan to repeat lipid in 3 months.  Thanks again for the kind referral.  Pixie Casino, MD, FACC, [place] Director of the [facility] Lip[facility] &  Cardiovascular Risk Reduction Clinic Diplomate of the American Board of Clinical Lipidology Attending Cardiologist  Direct Dial: [phone]  Fax: [phone]  Website:  [email or web address] 08/01/2019, 5:12 PM

## 2019-08-02 ENCOUNTER — Telehealth: Payer: Self-pay | Admitting: Internal Medicine

## 2019-08-02 ENCOUNTER — Telehealth: Payer: Self-pay | Admitting: *Deleted

## 2019-08-02 DIAGNOSIS — E785 Hyperlipidemia, unspecified: Secondary | ICD-10-CM

## 2019-08-02 MED ORDER — EZETIMIBE 10 MG PO TABS: 10.0000 mg | ORAL_TABLET | Freq: Every day | ORAL | 10 refills | Status: DC

## 2019-08-02 NOTE — Telephone Encounter (Signed)
Patient aware of results, MD recommendations. Agreed with plan.  Rx(s) sent to pharmacy electronically. Lab order will be mailed

## 2019-08-02 NOTE — Telephone Encounter (Signed)
Spoke with patient and reviewed Dr. Elmarie Shiley advice with her. She states she has a great deal of stress in her life and "I need to calm down."  States she also has difficulty sleeping. I asked if her chest pain feels like it did in the past prior to heart surgery or stent and she denies. She states she was more active prior to her husband's death almost a year ago. She agrees to work on getting more exercise and will call back if symptoms worsen. She states her mother who is almost 68 years old takes one sip of alcohol each evening to help her sleep and she is going to try doing the same. She thanked me for my call and will call back with questions or concerns.

## 2019-08-02 NOTE — Telephone Encounter (Signed)
-----   Message from Thayer Headings, MD sent at 08/01/2019  5:21 PM EDT ----- Low risk Myoview study.  Normal left ventricular systolic function.  No evidence of ischemia.

## 2019-08-02 NOTE — Telephone Encounter (Signed)
This sounds like a chest wall pain Her myoview was low risk Please have her continue to walk and exercise and let us know if she has any change in her symptoms

## 2019-08-02 NOTE — Telephone Encounter (Signed)
Notes recorded by Pixie Casino, MD on 08/01/2019 at 5:19 PM EDT  Restart Zetia 10 mg daily. Monitor for side effects. Repeat lipids in 3 months.   Dr. Lemmie Evens

## 2019-08-02 NOTE — Telephone Encounter (Signed)
Pt has been notified of Myoview results by phone with verbal understanding. Pt was glad for the good news. Pt did want to let Dr. Acie Fredrickson know though when she does lay down flat or on her left side she gets chest tightness, this goes away once she changes position. Pt states she does sleep with her bed elevated by 6 inches, however if she does lay flat she has chest tightness. I assured the pt that I will send my notes to Dr. Acie Fredrickson and his nurse Everitt Amber. RN for further evaluation. I assured her we will call her back if Dr. Acie Fredrickson has any new recommendations. Pt was thankful for the call. Patient notified of result.  Please refer to phone note from today for complete details.   Julaine Hua, [place] 08/02/2019 9:03 AM

## 2019-08-14 ENCOUNTER — Other Ambulatory Visit: Payer: Self-pay

## 2019-08-14 ENCOUNTER — Other Ambulatory Visit: Payer: Self-pay | Admitting: Family Medicine

## 2019-08-14 ENCOUNTER — Ambulatory Visit (INDEPENDENT_AMBULATORY_CARE_PROVIDER_SITE_OTHER): Payer: Medicare Other | Admitting: Family Medicine

## 2019-08-14 ENCOUNTER — Encounter: Payer: Self-pay | Admitting: Family Medicine

## 2019-08-14 VITALS — BP 120/50 | HR 70 | Temp 97.2°F | Resp 14 | Ht 65.0 in | Wt 117.0 lb

## 2019-08-14 DIAGNOSIS — E7800 Pure hypercholesterolemia, unspecified: Secondary | ICD-10-CM

## 2019-08-14 DIAGNOSIS — G3184 Mild cognitive impairment, so stated: Secondary | ICD-10-CM

## 2019-08-14 DIAGNOSIS — I2510 Atherosclerotic heart disease of native coronary artery without angina pectoris: Secondary | ICD-10-CM

## 2019-08-14 DIAGNOSIS — R300 Dysuria: Secondary | ICD-10-CM

## 2019-08-14 DIAGNOSIS — I10 Essential (primary) hypertension: Secondary | ICD-10-CM

## 2019-08-14 LAB — URINALYSIS, ROUTINE W REFLEX MICROSCOPIC
Bilirubin Urine: NEGATIVE
Glucose, UA: NEGATIVE
Hyaline Cast: NONE SEEN /LPF
Ketones, ur: NEGATIVE
Nitrite: NEGATIVE
Specific Gravity, Urine: 1.02 (ref 1.001–1.03)
pH: 5.5 (ref 5.0–8.0)

## 2019-08-14 LAB — MICROSCOPIC MESSAGE

## 2019-08-14 MED ORDER — CEPHALEXIN 500 MG PO CAPS: 500.0000 mg | ORAL_CAPSULE | Freq: Three times a day (TID) | ORAL | 0 refills | Status: DC

## 2019-08-14 NOTE — Progress Notes (Signed)
Subjective:    Patient ID: Nancy [name], female    DOB: [birth year], 73 y.o.   MRN: [id number]  HPI 11/03/18 Patient lost her husband December 1 due to lung cancer.  Since that time she had a difficult time sleeping.  She states that she goes to bed at 8:00 in the evening.  She will toss and turn for 2 hours.  She will then watch TV in the background until she finally drifts off usually 2 or 3 in the morning.  She will wake up 1 or 2 hours later and then she is unable to sleep.  This leaves her feeling extremely tired throughout the day.  She does have some mild age-related memory problems.  These have not worsened.  She denies depression although she is obviously grieving.  She denies any suicidal ideation.  She is interested in something that may help her fall asleep there would be non-habit-forming and hopefully also not exacerbate her mild underlying memory problems.  At that time, my plan was: We will try trazodone 25 to 50 mg p.o. nightly as needed insomnia.  We also discussed meditation as a means to help calm anxiety.  Reassess in 1 to 2 weeks  12/20/18 Patient states that the trazodone is not helpful.  She is unable to sleep.  This is now on every night occurrence.  She tosses and turns all throughout the night.  She seldom gets more than 2 to 3 hours of sleep per night.  She always feels tired and restless in the morning.  She also has an ingrown toenail on her right foot.  The fifth toenail is ingrown on the medial portion.  The skin around the nail is erythematous and indurated and tender.  The patient is requesting removal however she does not want ablation of the nail matrix.  Previous ablation of the great toe nail matrix by a podiatrist in the past left a dystrophic appearing toenail that the patient does not like the cosmetic appearance.  She prefers to have the toenail removed and allow it to grow back naturally and see if it will grow back healthy. At that time, my plan was: The right  fifth toe was anesthetized using a digital block with 0.1% lidocaine without epinephrine.  A tourniquet was applied to the base of the toe.  A metal elevator was then used to separate the toe nail from the underlying nail bed.  The entire toenail was then removed with gentle traction using a pair of hemostats.  Neosporin was then applied to the nailbed.  The toe was wrapped with petroleum gauze and then covered with Coban.  Wound care was discussed.  Recheck in 1 week if no better recheck sooner if it becomes erythematous painful or swollen.  Regarding her insomnia, I would recommend trying Xanax 0.5 mg p.o. nightly as needed insomnia due to its short half-life.  Hopefully this will wear out of the patient symptoms so during the daytime it will not exacerbate her mild memory impairment or increase her risk of falls or dizziness.  06/12/19 Patient states that for the last 6 months she has felt extremely weak.  She also reports feeling very tired.  She states that she has no energy.  She states that her muscles hurt constantly.  When asked her to describe tired, she does not feel sleepy tired.  Instead she feels weak "tired".  She physically does not have the strength to do the things that she wants to do.  She has the desire to do the things she wants to do but her muscles wear out easily.  They also hurt constantly.  She denies any fever or chills or rash.  She denies any nausea or vomiting.  She does report trouble sleeping.  She denies depression although she has lost her husband.  She does live alone.  I am also concerned that her memory seems to be worsening.  Previously I had her on medicine to help with memory loss.  Today she became confused when I asked her to go to the lab and have blood drawn despite having done this many times before.  She did not know where to go or how to get there.  This is unlike this patient indefinitely seems to be a change from our previous encounters over the years.  At that  time, my plan was: I have asked the patient to temporarily discontinue rosuvastatin as well as Zetia and then reassess the patient over the next 2 to 3 weeks to see if her muscle aches and fatigue improved.  Meanwhile I will check a CBC, CMP, TSH, and vitamin B12 to assess for any other potential causes of her fatigue.  Consider treating depression by switching Lexapro to Effexor if symptoms do not improve.  Also consider switching Toprol if her fatigue does not improve.  Reassess in 2 to 3 weeks.  08/14/19 Patient is here today for follow-up.  She states that she feels much better since the last time I saw her.  She feels that her depression has resolved.  In fact she wants to discontinue Lexapro.  She has a friend who is a neighbor who is now providing her companionship and this seems to have helped her depression is much is anything.  She continues to show mild short-term cognitive impairment.  She gets very confused following basic commands such as leaving a urinalysis or staying for a flu shot.  She frequently forgets things that I asked her to perform her to do.  However this seems stable.  She just had a neurocognitive evaluation that does show mild cognitive decline.  She is tolerating donepezil without difficulty.  She denies any chest pain shortness of breath or dyspnea on exertion.  Our medication list shows that she is taking losartan however her pillbox does not have losartan present.  Therefore apparently she has not been on this medication however her blood pressure today is normal at 120/50.  She declines to take this again.  Cardiology resumed her Zetia at their last visit and I reviewed her most recent lipid panel from earlier this month.  LDL cholesterol was mildly elevated in the 80s but overall look pretty good.  She does endorse some dysuria Past Medical History:  Diagnosis Date  . Coronary atherosclerosis of native coronary artery   . Dyslipidemia   . Essential hypertension, benign    . History of colonic polyps   . Osteoporosis   . 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 CORONARY ANGIOPLASTY WITH STENT PLACEMENT  2010   3 times  . CORONARY ARTERY BYPASS GRAFT  2009  . CRANIOTOMY  04/27/2012   Procedure: CRANIOTOMY HEMATOMA EVACUATION SUBDURAL;  Surgeon: Eustace Moore, MD;  Location: [facility];  Service: Neurosurgery;  Laterality: Right;  Right Frontal Burr hole for Subdural Hematoma  . NASAL SINUS SURGERY    . POLYPECTOMY     Current Outpatient Medications on File Prior to Visit  Medication Sig Dispense Refill  . aspirin 81 MG tablet Take 81 mg by mouth daily.     . Cholecalciferol (VITAMIN D-3 PO) Take 1 tablet by mouth every other day.     . donepezil (ARICEPT) 5 MG tablet Take 1/2 tablet daily for 2 weeks, then increase to 1 tablet daily 30 tablet 11  . escitalopram (LEXAPRO) 20 MG tablet Take 1 tablet (20 mg total) by mouth daily. 90 tablet 3  . ezetimibe (ZETIA) 10 MG tablet Take 1 tablet (10 mg total) by mouth daily. 30 tablet 10  . metoprolol succinate (TOPROL XL) 25 MG 24 hr tablet Take 1 tablet (25 mg total) by mouth daily. 90 tablet 3  . nitroGLYCERIN (NITROSTAT) 0.4 MG SL tablet Place 1 tablet (0.4 mg total) under the tongue every 5 (five) minutes as needed for chest pain. 25 tablet 6  . Omega-3 Fatty Acids (EQL OMEGA 3 FISH OIL) 1400 MG CAPS Take 1 tablet by mouth daily.    . vitamin E (VITAMIN E) 400 UNIT capsule Take 400 Units by mouth daily.     No current facility-administered medications on file prior to visit.     Allergies  Allergen Reactions  . Alendronate Shortness Of Breath and Swelling  . Atorvastatin Other (See Comments)    Muscle cramps  . Bactrim [Sulfamethoxazole-Trimethoprim] Other (See Comments)    Blisters/skin peeling, nausea  . Buprenorphine Hcl Nausea Only  . Morphine Nausea Only    OTHER  . Morphine And Related Nausea Only  . Prednisone Other (See Comments)    Unknown   . Sulfa Antibiotics Itching    Blisters/skin peeling  . Sulfasalazine Itching    Blisters/skin peeling  . Sulfonamide Derivatives Other (See Comments)    Blisters/skin peeling   Social History   Socioeconomic History  . Marital status: Widowed    Spouse name: Not on file  . Number of children: Not on file  . Years of education: Not on file  . Highest education level: Not on file  Occupational History  . Not on file  Social Needs  . Financial resource strain: Not on file  . Food insecurity    Worry: Not on file    Inability: Not on file  . Transportation needs    Medical: Not on file    Non-medical: Not on file  Tobacco Use  . Smoking status: Never Smoker  . Smokeless tobacco: Never Used  Substance and Sexual Activity  . Alcohol use: Yes    Comment: occasional glass wine  . Drug use: No  . Sexual activity: Not Currently    Partners: Male    Comment: 1ST INTERCOURSE- 46, PARTNERS- 1 , MARRIED - 12 YRS   Lifestyle  . Physical activity    Days per week: Not on file    Minutes per session: Not on file  . Stress: Not on file  Relationships  . Social Herbalist on phone: Not on file    Gets together: Not on file    Attends religious service: Not on file    Active member of club or organization: Not on file    Attends meetings of clubs or organizations: Not on file    Relationship status: Not on file  . Intimate partner violence    Fear of current or ex partner: Not on file    Emotionally abused: Not on file    Physically abused: Not on file    Forced sexual activity: Not on  file  Other Topics Concern  . Not on file  Social History Narrative   Pt lives in 2 story home with her husband, Jonni Sanger   Has 2 adult daughters, 2 granddaughters   12th grade education   Retired Charity fundraiser      Review of Systems  All other systems reviewed and are negative.      Objective:   Physical Exam Vitals signs reviewed.  Constitutional:      General: She is not in  acute distress.    Appearance: Normal appearance. She is normal weight.  Cardiovascular:     Rate and Rhythm: Normal rate and regular rhythm.     Heart sounds: Normal heart sounds.  Pulmonary:     Effort: Pulmonary effort is normal.     Breath sounds: Normal breath sounds.  Neurological:     Mental Status: She is alert and oriented to person, place, and time. Mental status is at baseline.  Psychiatric:        Mood and Affect: Mood normal.        Behavior: Behavior normal.        Thought Content: Thought content normal.        Judgment: Judgment normal.           Assessment & Plan:  Dysuria - Plan: Urinalysis, Routine w reflex microscopic  Mild neurocognitive disorder  Coronary artery disease involving native coronary artery of native heart without angina pectoris  Essential hypertension, benign  Pure hypercholesterolemia  Overall patient seems to be doing well.  I will check a urinalysis given her dysuria.  Her blood pressure today is well controlled.  We will temporarily discontinue losartan.  If her blood pressure rises by her next visit we can resume this medication however she has not been taking it for some time per her report.  Her cholesterol when last checked in October revealed a mildly elevated LDL cholesterol in the 80s however the patient is intolerant of any statin.  She does have mild neurocognitive decline and I will continue Aricept 5 mg a day.  I will avoid centrally acting medications as much as possible.  Will not make any change in her dose however at the present time.  Patient does want to wean off her Lexapro.  Therefore I recommended decreasing the dose from 20 mg a day to 10 mg a day.  In 4 weeks she can decrease the dose to 5 mg a day and then recheck with me via telephone in 2 months.  If she still doing well we can discontinue the medication at that time.  Recommended a flu shot.Marland Kitchen

## 2019-08-21 ENCOUNTER — Ambulatory Visit (INDEPENDENT_AMBULATORY_CARE_PROVIDER_SITE_OTHER): Payer: Medicare Other

## 2019-08-21 ENCOUNTER — Other Ambulatory Visit: Payer: Self-pay

## 2019-08-21 DIAGNOSIS — Z23 Encounter for immunization: Secondary | ICD-10-CM

## 2019-09-01 ENCOUNTER — Other Ambulatory Visit: Payer: Self-pay | Admitting: Cardiovascular Disease

## 2019-09-06 ENCOUNTER — Other Ambulatory Visit: Payer: Self-pay

## 2019-09-06 MED ORDER — METOPROLOL SUCCINATE ER 25 MG PO TB24: 25.0000 mg | ORAL_TABLET | Freq: Every day | ORAL | 3 refills | Status: DC

## 2019-10-02 ENCOUNTER — Other Ambulatory Visit: Payer: Self-pay | Admitting: Neurology

## 2019-10-30 ENCOUNTER — Encounter: Payer: Self-pay | Admitting: Internal Medicine

## 2019-10-30 ENCOUNTER — Ambulatory Visit: Payer: Medicare Other | Admitting: Internal Medicine

## 2019-10-30 ENCOUNTER — Other Ambulatory Visit: Payer: Self-pay

## 2019-10-30 ENCOUNTER — Ambulatory Visit (INDEPENDENT_AMBULATORY_CARE_PROVIDER_SITE_OTHER): Payer: Medicare Other | Admitting: Internal Medicine

## 2019-10-30 VITALS — BP 136/58 | HR 68 | Temp 97.1°F | Ht 65.0 in | Wt 126.0 lb

## 2019-10-30 DIAGNOSIS — R21 Rash and other nonspecific skin eruption: Secondary | ICD-10-CM | POA: Diagnosis not present

## 2019-10-30 DIAGNOSIS — Z789 Other specified health status: Secondary | ICD-10-CM

## 2019-10-30 DIAGNOSIS — E785 Hyperlipidemia, unspecified: Secondary | ICD-10-CM | POA: Diagnosis not present

## 2019-10-30 NOTE — Progress Notes (Signed)
LIPID CLINIC CONSULT NOTE  Chief Complaint:  Follow-up dyslipidemia  Primary Care Physician: Susy Frizzle, MD  Primary Cardiologist:  Mertie Moores, MD  HPI:  Nancy [name] is a 74 y.o. female who is being seen today for the evaluation of dyslipidemia at the request of Susy Frizzle, MD.  This is a pleasant 74 year old female with a history of coronary artery disease, dyslipidemia, hypertension and statin intolerance.  Recently she has been having some symptoms concerning for angina and saw Dr. Acie Fredrickson.  He ordered a stress test.  In addition he referred her to the lipid clinic for evaluation and possible therapeutic options.  She also needed updated lipid profile.  A year ago her total cholesterol is 120, triglycerides 91, HDL 51 and LDL was 51, however she was on some therapy at the time.  Since then she has discontinued all of her statin medications.  I did get a repeat lipid profile on her today which showed a expected increase in her total cholesterol 171, triglycerides 86, HDL 66 and LDL of 89.  Her goal LDL is less than 70.  10/30/2019  Ms. Battaglino returns today for follow-up.  Based on her last lipid numbers which showed an LDL in the 80s, I had recommended adding ezetimibe.  She has been taking that however did have some concerns.  She noted today that she has had some itching, swelling and erythema around her eyes, over her cheeks and behind her ears.  There is no evidence of any desquamation and she has tried topical creams, emollients and hydrocortisone briefly without improvement.  PMHx:  Past Medical History:  Diagnosis Date  . Coronary atherosclerosis of native coronary artery   . Dyslipidemia   . Essential hypertension, benign   . History of colonic polyps   . Osteoporosis   . Subdural hematoma ([facility]) 04/27/2012    Past Surgical History:  Procedure Laterality Date  . CORONARY ANGIOPLASTY WITH STENT PLACEMENT  2010   3 times  . CORONARY ARTERY BYPASS GRAFT   2009  . CRANIOTOMY  04/27/2012   Procedure: CRANIOTOMY HEMATOMA EVACUATION SUBDURAL;  Surgeon: Eustace Moore, MD;  Location: [facility];  Service: Neurosurgery;  Laterality: Right;  Right Frontal Burr hole for Subdural Hematoma  . NASAL SINUS SURGERY    . POLYPECTOMY      FAMHx:  Family History  Problem Relation Age of Onset  . Hypertension Mother   . Cancer Mother        Colon  . Kidney disease Mother   . Memory loss Mother   . Heart attack Father   . Hypertension Brother   . Stroke Maternal Grandmother   . Memory loss Maternal Grandmother   . Cancer Maternal Grandfather        Intestinal    SOCHx:   reports that she has never smoked. She has never used smokeless tobacco. She reports current alcohol use. She reports that she does not use drugs.  ALLERGIES:  Allergies  Allergen Reactions  . Alendronate Shortness Of Breath and Swelling  . Atorvastatin Other (See Comments)    Muscle cramps  . Bactrim [Sulfamethoxazole-Trimethoprim] Other (See Comments)    Blisters/skin peeling, nausea  . Buprenorphine Hcl Nausea Only  . Morphine Nausea Only    OTHER  . Morphine And Related Nausea Only  . Prednisone Other (See Comments)    Unknown  . Sulfa Antibiotics Itching    Blisters/skin peeling  . Sulfasalazine Itching    Blisters/skin  peeling  . Sulfonamide Derivatives Other (See Comments)    Blisters/skin peeling    ROS: Pertinent items noted in HPI and remainder of comprehensive ROS otherwise negative.  HOME MEDS: Current Outpatient Medications on File Prior to Visit  Medication Sig Dispense Refill  . aspirin 81 MG tablet Take 81 mg by mouth daily.     . Cholecalciferol (VITAMIN D-3 PO) Take 1 tablet by mouth every other day.     . donepezil (ARICEPT) 5 MG tablet Take 1/2 tablet daily for 2 weeks, then increase to 1 tablet daily 30 tablet 11  . escitalopram (LEXAPRO) 20 MG tablet Take 1 tablet by mouth once daily 90 tablet 0  . ezetimibe (ZETIA) 10 MG tablet Take 1  tablet (10 mg total) by mouth daily. 30 tablet 10  . metoprolol succinate (TOPROL XL) 25 MG 24 hr tablet Take 1 tablet (25 mg total) by mouth daily. 90 tablet 3  . nitroGLYCERIN (NITROSTAT) 0.4 MG SL tablet Place 1 tablet (0.4 mg total) under the tongue every 5 (five) minutes as needed for chest pain. 25 tablet 6  . Omega-3 Fatty Acids (EQL OMEGA 3 FISH OIL) 1400 MG CAPS Take 1 tablet by mouth daily.    . vitamin E (VITAMIN E) 400 UNIT capsule Take 400 Units by mouth daily.     No current facility-administered medications on file prior to visit.    LABS/IMAGING: No results found for this or any previous visit (from the past 48 hour(s)). No results found.  LIPID PANEL:    Component Value Date/Time   CHOL 171 07/31/2019 1055   TRIG 86 07/31/2019 1055   HDL 66 07/31/2019 1055   CHOLHDL 2.6 07/31/2019 1055   CHOLHDL 2.0 06/23/2016 0814   VLDL 14 06/23/2016 0814   LDLCALC 89 07/31/2019 1055    WEIGHTS: Wt Readings from Last 3 Encounters:  10/30/19 126 lb (57.2 kg)  08/14/19 117 lb (53.1 kg)  08/01/19 119 lb (54 kg)    VITALS: BP (!) 136/58 (BP Location: Left Arm, Patient Position: Sitting, Cuff Size: Normal)   Pulse 68   Temp (!) 97.1 F (36.2 C)   Ht 5\' 5"  (1.651 m)   Wt 126 lb (57.2 kg)   BMI 20.97 kg/m   EXAM: Neck: Facial erythema and dry appearing skin  EKG: Deferred  ASSESSMENT: 1. Mixed dyslipidemia 2. Statin intolerance 3. History of coronary artery disease with prior CABG and PCI 4. Hypertension 5. Facial rash  PLAN: 1.   Ms. Santodomingo is in need of repeat lipid testing.  We will go ahead and obtain that today nonfasting.  I am interested to see if her LDL is at goal.  Is not clear that this facial rash is related to ezetimibe however it is possible.  There is a warning for possible Stevens-Johnson syndrome however this shows no evidence of that including no evidence for desquamation or discomfort.  It looks more to me like a dermatitis possibly related to mask  wear although she says she does not wear it that often.  I encouraged her to seek out a dermatologist and if there is still feeling that it may be related to the ezetimibe we could try a short holiday.  Otherwise if her lipids reached goal she could follow-up with Dr. Acie Fredrickson.  Pixie Casino, MD, [facility], [place] Director of the Advanced Lipid Disorders &  Cardiovascular Risk Reduction Clinic Diplomate of the American Board of Clinical Lipidology  Attending Cardiologist  Direct Dial: [phone]  Fax: [phone]  Website:  [email or web address] 10/30/2019, 4:30 PM

## 2019-10-30 NOTE — Patient Instructions (Signed)
Medication Instructions:  Your physician recommends that you continue on your current medications as directed. Please refer to the Current Medication list given to you today.  *If you need a refill on your cardiac medications before your next appointment, please call your pharmacy*  Lab Work: LIPID PANEL today If you have labs (blood work) drawn today and your tests are completely normal, you will receive your results only by: Marland Kitchen MyChart Message (if you have MyChart) OR . A paper copy in the mail If you have any lab test that is abnormal or we need to change your treatment, we will call you to review the results.  Testing/Procedures: NONE  Follow-Up: At [facility], you and your health needs are our priority.  As part of our continuing mission to provide you with exceptional heart care, we have created designated Provider Care Teams.  These Care Teams include your primary Cardiologist (physician) and Advanced Practice Providers (APPs -  Physician Assistants and Nurse Practitioners) who all work together to provide you with the care you need, when you need it.  Your next appointment:   AS NEEDED with Dr. Debara Pickett (pending lab results)

## 2019-10-31 LAB — LIPID PANEL
Chol/HDL Ratio: 2.6 ratio (ref 0.0–4.4)
Cholesterol, Total: 185 mg/dL (ref 100–199)
HDL: 70 mg/dL (ref >39)
LDL Chol Calc (NIH): 101 mg/dL — ABNORMAL HIGH (ref 0–99)
Triglycerides: 79 mg/dL (ref 0–149)
VLDL Cholesterol Cal: 14 mg/dL (ref 5–40)

## 2019-11-09 ENCOUNTER — Ambulatory Visit (INDEPENDENT_AMBULATORY_CARE_PROVIDER_SITE_OTHER): Payer: Medicare Other | Admitting: Family Medicine

## 2019-11-09 ENCOUNTER — Other Ambulatory Visit: Payer: Self-pay

## 2019-11-09 ENCOUNTER — Encounter: Payer: Self-pay | Admitting: Family Medicine

## 2019-11-09 ENCOUNTER — Ambulatory Visit
Admission: RE | Admit: 2019-11-09 | Discharge: 2019-11-09 | Disposition: A | Discharge status: Home / Self Care | Payer: Medicare Other | Source: Ambulatory Visit | Attending: Family Medicine | Admitting: Family Medicine

## 2019-11-09 VITALS — BP 140/64 | HR 58 | Temp 97.2°F | Resp 16 | Ht 65.0 in | Wt 123.0 lb

## 2019-11-09 DIAGNOSIS — R1084 Generalized abdominal pain: Secondary | ICD-10-CM | POA: Diagnosis not present

## 2019-11-09 DIAGNOSIS — K921 Melena: Secondary | ICD-10-CM | POA: Diagnosis not present

## 2019-11-09 DIAGNOSIS — M2550 Pain in unspecified joint: Secondary | ICD-10-CM | POA: Diagnosis not present

## 2019-11-09 DIAGNOSIS — R109 Unspecified abdominal pain: Secondary | ICD-10-CM | POA: Diagnosis not present

## 2019-11-09 LAB — URINALYSIS, ROUTINE W REFLEX MICROSCOPIC
Bacteria, UA: NONE SEEN /HPF
Bilirubin Urine: NEGATIVE
Glucose, UA: NEGATIVE
Hyaline Cast: NONE SEEN /LPF
Leukocytes,Ua: NEGATIVE
Nitrite: NEGATIVE
Protein, ur: NEGATIVE
Specific Gravity, Urine: 1.02 (ref 1.001–1.03)
WBC, UA: NONE SEEN /HPF (ref 0–5)
pH: 6 (ref 5.0–8.0)

## 2019-11-09 LAB — MICROSCOPIC MESSAGE

## 2019-11-09 MED ORDER — MAGNESIUM CITRATE PO SOLN: 1.0000 | Freq: Once | ORAL | 0 refills | Status: AC

## 2019-11-09 NOTE — Progress Notes (Signed)
Acute Office Visit  Subjective:    Patient ID: Nancy [name], female    DOB: February 13, 1946, 74 y.o.   MRN: [id number]  Chief Complaint  Patient presents with  . Follow-up    Discuss medications - lower leg pain  . Abd cramps with thick liquid stool    HPI Patient 74 year old Caucasian female who presents today with multiple medical complaints.  #1, the patient has not had a bowel movement for more than a week.  She states that she has had occasional small bowel movements that have been black in color but not a normal bowel movement in over a week.  Furthermore she reports episodic lower abdominal pain that she describes as a sharp sensation that hits in her lower pelvis.  This comes and goes without provocation.  She denies any dysuria or hematuria.  She denies any hematochezia however she does have constipation and occasional black stool.  The stool is not tarry.  She denies any fevers or chills or nausea or vomiting.  She also reports diffuse pain.  This is been a common symptom for the patient in the past.  However today she isolates the pain to her left shoulder, the joints on both hands, and the muscles in her hamstrings as well as gastrocnemius bilaterally.  She states that this occurs and hurts on a daily basis and is a dull aching pain.  She denies any fevers or chills or heliotrope rash.  She is on Zetia.  She also is a little more confused.  She has mild dementia and this seems to be steadily worsening.  She does report polyuria however I have avoided medication for overactive bladder due to the fear of anticholinergic side effects.  Past Medical History:  Diagnosis Date  . Coronary atherosclerosis of native coronary artery   . Dyslipidemia   . Essential hypertension, benign   . History of colonic polyps   . Osteoporosis   . 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 CORONARY ANGIOPLASTY WITH STENT PLACEMENT  2010   3 times    . CORONARY ARTERY BYPASS GRAFT  2009  . CRANIOTOMY  04/27/2012   Procedure: CRANIOTOMY HEMATOMA EVACUATION SUBDURAL;  Surgeon: Eustace Moore, MD;  Location: [facility];  Service: Neurosurgery;  Laterality: Right;  Right Frontal Burr hole for Subdural Hematoma  . NASAL SINUS SURGERY    . POLYPECTOMY      Family History  Problem Relation Age of Onset  . Hypertension Mother   . Cancer Mother        Colon  . Kidney disease Mother   . Memory loss Mother   . Heart attack Father   . Hypertension Brother   . Stroke Maternal Grandmother   . Memory loss Maternal Grandmother   . Cancer Maternal Grandfather        Intestinal    Social History   Socioeconomic History  . Marital status: Widowed    Spouse name: Not on file  . Number of children: Not on file  . Years of education: Not on file  . Highest education level: Not on file  Occupational History  . Not on file  Tobacco Use  . Smoking status: Never Smoker  . Smokeless tobacco: Never Used  Substance and Sexual Activity  . Alcohol use: Yes    Comment: occasional glass wine  . Drug use: No  . Sexual activity: Not Currently    Partners: Male  Comment: 1ST INTERCOURSE- 18, PARTNERS- 1 , MARRIED - 57 YRS   Other Topics Concern  . Not on file  Social History Narrative   Pt lives in 2 story home with her husband, Jonni Sanger   Has 2 adult daughters, 2 granddaughters   12th grade education   Retired Charity fundraiser    Social Determinants of Radio broadcast assistant Strain:   . Difficulty of Paying Living Expenses: Not on file  Food Insecurity:   . Worried About Charity fundraiser in the Last Year: Not on file  . Ran Out of Food in the Last Year: Not on file  Transportation Needs:   . Lack of Transportation (Medical): Not on file  . Lack of Transportation (Non-Medical): Not on file  Physical Activity:   . Days of Exercise per Week: Not on file  . Minutes of Exercise per Session: Not on file  Stress:   . Feeling of Stress :  Not on file  Social Connections:   . Frequency of Communication with Friends and Family: Not on file  . Frequency of Social Gatherings with Friends and Family: Not on file  . Attends Religious Services: Not on file  . Active Member of Clubs or Organizations: Not on file  . Attends Archivist Meetings: Not on file  . Marital Status: Not on file  Intimate Partner Violence:   . Fear of Current or Ex-Partner: Not on file  . Emotionally Abused: Not on file  . Physically Abused: Not on file  . Sexually Abused: Not on file    Outpatient Medications Prior to Visit  Medication Sig Dispense Refill  . aspirin 81 MG tablet Take 81 mg by mouth daily.     . Cholecalciferol (VITAMIN D-3 PO) Take 1 tablet by mouth every other day.     . cyanocobalamin 1000 MCG tablet Take 1,000 mcg by mouth daily.    Marland Kitchen donepezil (ARICEPT) 5 MG tablet Take 1/2 tablet daily for 2 weeks, then increase to 1 tablet daily (Patient taking differently: Take 5 mg by mouth at bedtime. Take 1/2 tablet daily for 2 weeks, then increase to 1 tablet daily) 30 tablet 11  . escitalopram (LEXAPRO) 20 MG tablet Take 1 tablet by mouth once daily 90 tablet 0  . ezetimibe (ZETIA) 10 MG tablet Take 1 tablet (10 mg total) by mouth daily. 30 tablet 10  . magnesium oxide (MAG-OX) 400 MG tablet Take 400 mg by mouth daily.    . metoprolol succinate (TOPROL XL) 25 MG 24 hr tablet Take 1 tablet (25 mg total) by mouth daily. 90 tablet 3  . nitroGLYCERIN (NITROSTAT) 0.4 MG SL tablet Place 1 tablet (0.4 mg total) under the tongue every 5 (five) minutes as needed for chest pain. 25 tablet 6  . Omega-3 Fatty Acids (EQL OMEGA 3 FISH OIL) 1400 MG CAPS Take 1 tablet by mouth daily.    . vitamin E (VITAMIN E) 400 UNIT capsule Take 400 Units by mouth daily.     No facility-administered medications prior to visit.    Allergies  Allergen Reactions  . Alendronate Shortness Of Breath and Swelling  . Atorvastatin Other (See Comments)    Muscle  cramps  . Bactrim [Sulfamethoxazole-Trimethoprim] Other (See Comments)    Blisters/skin peeling, nausea  . Buprenorphine Hcl Nausea Only  . Morphine Nausea Only    OTHER  . Morphine And Related Nausea Only  . Prednisone Other (See Comments)    Unknown  . Sulfa Antibiotics  Itching    Blisters/skin peeling  . Sulfasalazine Itching    Blisters/skin peeling  . Sulfonamide Derivatives Other (See Comments)    Blisters/skin peeling    Review of Systems  All other systems reviewed and are negative.      Objective:    Physical Exam Constitutional:      General: She is not in acute distress.    Appearance: Normal appearance. She is not ill-appearing or toxic-appearing.  Cardiovascular:     Rate and Rhythm: Normal rate and regular rhythm.     Heart sounds: Normal heart sounds.  Pulmonary:     Effort: Pulmonary effort is normal.     Breath sounds: Normal breath sounds. No wheezing, rhonchi or rales.  Abdominal:     General: Abdomen is flat. Bowel sounds are normal. There is no distension.     Palpations: Abdomen is soft.     Tenderness: There is no abdominal tenderness. There is no guarding.  Neurological:     Mental Status: She is alert.     BP 140/64   Pulse (!) 58   Temp (!) 97.2 F (36.2 C) (Other (Comment))   Resp 16   Ht 5\' 5"  (1.651 m)   Wt 123 lb (55.8 kg)   SpO2 98%   BMI 20.47 kg/m  Wt Readings from Last 3 Encounters:  11/09/19 123 lb (55.8 kg)  10/30/19 126 lb (57.2 kg)  08/14/19 117 lb (53.1 kg)    Health Maintenance Due  Topic Date Due  . Hepatitis C Screening  1945/12/29  . FOOT EXAM  02/01/1956  . OPHTHALMOLOGY EXAM  02/01/1956  . URINE MICROALBUMIN  02/01/1956  . HEMOGLOBIN A1C  10/29/2013  . MAMMOGRAM  06/05/2019    There are no preventive care reminders to display for this patient.   Lab Results  Component Value Date   TSH 1.43 06/12/2019   Lab Results  Component Value Date   WBC 7.0 06/12/2019   HGB 13.8 06/12/2019   HCT 40.7  06/12/2019   MCV 92.7 06/12/2019   PLT 209 06/12/2019   Lab Results  Component Value Date   NA 140 06/12/2019   K 4.5 06/12/2019   CO2 29 06/12/2019   GLUCOSE 94 06/12/2019   BUN 23 06/12/2019   CREATININE 0.93 06/12/2019   BILITOT 0.4 06/12/2019   ALKPHOS 52 03/21/2018   AST 25 06/12/2019   ALT 29 06/12/2019   PROT 6.9 06/12/2019   ALBUMIN 4.6 03/21/2018   CALCIUM 9.9 06/12/2019   ANIONGAP 8 01/02/2019   GFR 58.58 (L) 03/21/2018   Lab Results  Component Value Date   CHOL 185 10/30/2019   Lab Results  Component Value Date   HDL 70 10/30/2019   Lab Results  Component Value Date   LDLCALC 101 (H) 10/30/2019   Lab Results  Component Value Date   TRIG 79 10/30/2019   Lab Results  Component Value Date   CHOLHDL 2.6 10/30/2019   Lab Results  Component Value Date   HGBA1C 5.6 04/28/2013       Assessment & Plan:   Problem List Items Addressed This Visit    None    Visit Diagnoses    Generalized abdominal pain    -  Primary   Relevant Orders   COMPLETE METABOLIC PANEL WITH GFR   Urinalysis, Routine w reflex microscopic   DG Abd 2 Views   Polyarthralgia       Relevant Orders   Sedimentation rate   Rheumatoid factor  CK   COMPLETE METABOLIC PANEL WITH GFR   Melena       Relevant Orders   CBC with Differential/Platelet   COMPLETE METABOLIC PANEL WITH GFR   DG Abd 2 Views   Fecal Globin By Immunochemistry     Patient's abdominal pain I suspect is constipation.  This would explain episodic crampy-like lower abdominal pain and it also would match timewise with when the patient last reports having normal bowel movements.  I will obtain abdominal x-ray to evaluate for any evidence of fecal impaction.  However I will treat the patient empirically for constipation.  I will have the patient take magnesium citrate and see if her symptoms improve.  Also given the location of the pain will obtain a urinalysis just to rule out a urinary tract infection.  Patient  has had occasional episodes of black stool.  I will check a CBC to rule out significant anemia.  I will also send the patient home with fecal occult blood test to rule out a GI bleed however I suspect that it may just be abnormal discoloration.  Patient frequently complains of pain.  However given her advanced age, autoimmune diseases are also on the differential diagnosis.  I will check a rheumatoid factor, sedimentation rate to rule out polymyalgia rheumatica, and a CK level to rule out myopathy such as myositis or medication induced rhabdomyolysis.     Susy Frizzle, MD

## 2019-11-10 ENCOUNTER — Other Ambulatory Visit: Payer: Self-pay | Admitting: Family Medicine

## 2019-11-10 LAB — CBC WITH DIFFERENTIAL/PLATELET
Absolute Monocytes: 972 cells/uL — ABNORMAL HIGH (ref 200–950)
Basophils Absolute: 72 cells/uL (ref 0–200)
Basophils Relative: 0.8 %
Eosinophils Absolute: 72 cells/uL (ref 15–500)
Eosinophils Relative: 0.8 %
HCT: 40 % (ref 35.0–45.0)
Hemoglobin: 13.6 g/dL (ref 11.7–15.5)
Lymphs Abs: 1107 cells/uL (ref 850–3900)
MCH: 31.7 pg (ref 27.0–33.0)
MCHC: 34 g/dL (ref 32.0–36.0)
MCV: 93.2 fL (ref 80.0–100.0)
MPV: 11.3 fL (ref 7.5–12.5)
Monocytes Relative: 10.8 %
Neutro Abs: 6777 cells/uL (ref 1500–7800)
Neutrophils Relative %: 75.3 %
Platelets: 264 10*3/uL (ref 140–400)
RBC: 4.29 10*6/uL (ref 3.80–5.10)
RDW: 12.5 % (ref 11.0–15.0)
Total Lymphocyte: 12.3 %
WBC: 9 10*3/uL (ref 3.8–10.8)

## 2019-11-10 LAB — COMPLETE METABOLIC PANEL WITH GFR
AG Ratio: 1.6 (calc) (ref 1.0–2.5)
ALT: 13 U/L (ref 6–29)
AST: 19 U/L (ref 10–35)
Albumin: 4.1 g/dL (ref 3.6–5.1)
Alkaline phosphatase (APISO): 60 U/L (ref 37–153)
BUN: 15 mg/dL (ref 7–25)
CO2: 30 mmol/L (ref 20–32)
Calcium: 9.4 mg/dL (ref 8.6–10.4)
Chloride: 102 mmol/L (ref 98–110)
Creat: 0.92 mg/dL (ref 0.60–0.93)
GFR, Est African American: 72 mL/min/{1.73_m2} (ref >=60)
GFR, Est Non African American: 62 mL/min/{1.73_m2} (ref >=60)
Globulin: 2.6 g/dL (calc) (ref 1.9–3.7)
Glucose, Bld: 89 mg/dL (ref 65–99)
Potassium: 4.4 mmol/L (ref 3.5–5.3)
Sodium: 140 mmol/L (ref 135–146)
Total Bilirubin: 0.7 mg/dL (ref 0.2–1.2)
Total Protein: 6.7 g/dL (ref 6.1–8.1)

## 2019-11-10 LAB — CK: Total CK: 142 U/L (ref 29–143)

## 2019-11-10 LAB — SEDIMENTATION RATE: Sed Rate: 38 mm/h — ABNORMAL HIGH (ref 0–30)

## 2019-11-10 LAB — RHEUMATOID FACTOR: Rheumatoid fact SerPl-aCnc: <14 IU/mL (ref <14)

## 2019-11-10 MED ORDER — PREDNISONE 20 MG PO TABS: 20.0000 mg | ORAL_TABLET | Freq: Every day | ORAL | 0 refills | Status: DC

## 2019-12-12 DIAGNOSIS — H524 Presbyopia: Secondary | ICD-10-CM | POA: Diagnosis not present

## 2019-12-12 DIAGNOSIS — H52203 Unspecified astigmatism, bilateral: Secondary | ICD-10-CM | POA: Diagnosis not present

## 2019-12-12 DIAGNOSIS — H43812 Vitreous degeneration, left eye: Secondary | ICD-10-CM | POA: Diagnosis not present

## 2019-12-12 DIAGNOSIS — H2513 Age-related nuclear cataract, bilateral: Secondary | ICD-10-CM | POA: Diagnosis not present

## 2019-12-22 ENCOUNTER — Other Ambulatory Visit: Payer: Self-pay

## 2019-12-22 ENCOUNTER — Telehealth: Payer: Self-pay | Admitting: Neurology

## 2019-12-22 MED ORDER — ESCITALOPRAM OXALATE 20 MG PO TABS: 20.0000 mg | ORAL_TABLET | Freq: Every day | ORAL | 0 refills | Status: DC

## 2019-12-22 NOTE — Telephone Encounter (Signed)
Script was sent to [facility] for pt

## 2019-12-22 NOTE — Telephone Encounter (Signed)
Patient called in regarding her needing a refill on her Lexapro medication. She said that her pharmacy did not have a Rx to fill for her. She uses [facility]. She is scheduled to see Dr.Aquino in April. Thank you

## 2020-01-23 ENCOUNTER — Ambulatory Visit (INDEPENDENT_AMBULATORY_CARE_PROVIDER_SITE_OTHER): Payer: Medicare Other | Admitting: Neurology

## 2020-01-23 ENCOUNTER — Other Ambulatory Visit: Payer: Self-pay

## 2020-01-23 ENCOUNTER — Encounter: Payer: Self-pay | Admitting: Neurology

## 2020-01-23 VITALS — BP 145/60 | HR 64 | Ht 65.0 in | Wt 127.0 lb

## 2020-01-23 DIAGNOSIS — G3184 Mild cognitive impairment, so stated: Secondary | ICD-10-CM

## 2020-01-23 MED ORDER — DONEPEZIL HCL 10 MG PO TABS: ORAL_TABLET | ORAL | 3 refills | Status: DC

## 2020-01-23 NOTE — Patient Instructions (Addendum)
1. Increase Donepezil to 10mg  daily  2. Try the CD player for better sleep, if no improvement, would recommend a sleep study  3. Consider seeing a counselor  4. Schedule repeat Neurocognitive testing for September 2021  5. Follow-up in 6 months, call for any changes  FALL PRECAUTIONS: Be cautious when walking. Scan the area for obstacles that may increase the risk of trips and falls. When getting up in the mornings, sit up at the edge of the bed for a few minutes before getting out of bed. Consider elevating the bed at the head end to avoid drop of blood pressure when getting up. Walk always in a well-lit room (use night lights in the walls). Avoid area rugs or power cords from appliances in the middle of the walkways. Use a walker or a cane if necessary and consider physical therapy for balance exercise. Get your eyesight checked regularly.  FINANCIAL OVERSIGHT: Supervision, especially oversight when making financial decisions or transactions is also recommended.  HOME SAFETY: Consider the safety of the kitchen when operating appliances like stoves, microwave oven, and blender. Consider having supervision and share cooking responsibilities until no longer able to participate in those. Accidents with firearms and other hazards in the house should be identified and addressed as well.  DRIVING: Regarding driving, in patients with progressive memory problems, driving will be impaired. We advise to have someone else do the driving if trouble finding directions or if minor accidents are reported. Independent driving assessment is available to determine safety of driving.  ABILITY TO BE LEFT ALONE: If patient is unable to contact 911 operator, consider using LifeLine, or when the need is there, arrange for someone to stay with patients. Smoking is a fire hazard, consider supervision or cessation. Risk of wandering should be assessed by caregiver and if detected at any point, supervision and safe proof  recommendations should be instituted.  MEDICATION SUPERVISION: Inability to self-administer medication needs to be constantly addressed. Implement a mechanism to ensure safe administration of the medications.  RECOMMENDATIONS FOR ALL PATIENTS WITH MEMORY PROBLEMS: 1. Continue to exercise (Recommend 30 minutes of walking everyday, or 3 hours every week) 2. Increase social interactions - continue going to [place] and enjoy social gatherings with friends and family 3. Eat healthy, avoid fried foods and eat more fruits and vegetables 4. Maintain adequate blood pressure, blood sugar, and blood cholesterol level. Reducing the risk of stroke and cardiovascular disease also helps promoting better memory. 5. Avoid stressful situations. Live a simple life and avoid aggravations. Organize your time and prepare for the next day in anticipation. 6. Sleep well, avoid any interruptions of sleep and avoid any distractions in the bedroom that may interfere with adequate sleep quality 7. Avoid sugar, avoid sweets as there is a strong link between excessive sugar intake, diabetes, and cognitive impairment The Mediterranean diet has been shown to help patients reduce the risk of progressive memory disorders and reduces cardiovascular risk. This includes eating fish, eat fruits and green leafy vegetables, nuts like almonds and hazelnuts, walnuts, and also use olive oil. Avoid fast foods and fried foods as much as possible. Avoid sweets and sugar as sugar use has been linked to worsening of memory function.  There is always a concern of gradual progression of memory problems. If this is the case, then we may need to adjust level of care according to patient needs. Support, both to the patient and caregiver, should then be put into place.

## 2020-01-23 NOTE — Progress Notes (Signed)
NEUROLOGY FOLLOW UP OFFICE NOTE  ARZETTA CADOTTE [id number] 17-Aug-1946  HISTORY OF PRESENT ILLNESS: I had the pleasure of seeing Nancy [name] in follow-up in the neurology clinic on 01/23/2020.  The patient was last seen 8 months ago for worsening memory. She is again accompanied by her daughter who helps supplement the history today. Records were reviewed. She underwent Neurocognitive testing in 06/2019 with a diagnosis of Mild Neurocognitive Disorder, etiology likely Alzheimer's disease given largely amnestic profile and difficulties across semantic fluency and confrontation naming tasks. It is also possible that language deficits stem from 2013 subdural hematoma, but not the primary culprit. Factors which can exacerbate include mild levels of psychiatric distress and ongoing sleep disruptions. Since her last visit, her daughter mostly notices a change with her confidence and anxiety, agitation. Family fixes her pillbox and she takes them by herself. She denies getting lost driving, she uses her GPS but has a hard time operating it. She reports that she is getting happier, she feels better. A neighbor keeps her company. She has always had sleep issues, she feels the TV plays a role.    History on Initial Assessment 04/06/2018: This is a pleasant 74 year old ambidextrous woman with a history of right chronic subdural hematoma s/p craniectomy in 2013, hyperlipidemia, hypertension, CAD, presenting for evaluation of worsening memory. She and her husband reports memory changes started soon after her brain surgery in 2013, but have become more progressive and significantly noticeable in the past year. Soon after surgery, her husband noticed that she would have word-finding difficulties, mixing up [place] and [place]. Over the past year, the word-finding difficulties have worsened, she states she sometimes forgets her grandparents names, when she meant to say grandchildren. She got a shock around 1-1.5 years ago  at the doctor's office when she could not remember her address and had to look at her drivers license. She loses things a lot at home, she has 9 pairs of reading glasses and always has a hard time finding them. She does not cook as much and denies leaving the stove on. She continues to manage bills and medications without significant difficulties, she occasionally forgets her medications when they are busy. She stopped driving after heart surgery years ago, she denied getting lost but felt afraid. She is able to bathe and dress independently. Over the past year, she has become quite anxious about minor things, sometimes the dog barking would make her very nervous. She has found taking [place] helps with this. She has a prescription for Xanax but does not take it regularly. She was noted to be anxious at several points during the visit today.  She denies any regular headaches but has a light one today from running around. She has light dizziness when standing at times. She denies any diplopia, dysarthria/dysphagia, focal numbness/tingling/weakness, both feet are numb. She has low back pain and occasional neck pain. She has constipation. No anosmia, tremors, REM behavior disorder. Her husband denies any paranoia or hallucinations. She does not sleep well, no wandering behavior. Her mother and maternal grandmother had memory issues. Aside from head injury in 2013 (hit her head getting up, leading to subdural hematoma), no other head injuries or falls. She rarely drinks alcohol.  She had an MRI brain with and without contrast in July 2019 which I personally reviewed, no acute changes, there was mild diffuse volume loss, minimal chronic microvascular disease.    PAST MEDICAL HISTORY: Past Medical History:  Diagnosis Date  .  Coronary atherosclerosis of native coronary artery   . Dyslipidemia   . Essential hypertension, benign   . History of colonic polyps   . Osteoporosis   . Subdural hematoma  ([place]) 04/27/2012    MEDICATIONS: Current Outpatient Medications on File Prior to Visit  Medication Sig Dispense Refill  . aspirin 81 MG tablet Take 81 mg by mouth daily.     . Cholecalciferol (VITAMIN D-3 PO) Take 1 tablet by mouth every other day.     . cyanocobalamin 1000 MCG tablet Take 1,000 mcg by mouth daily.    Marland Kitchen escitalopram (LEXAPRO) 20 MG tablet Take 1 tablet (20 mg total) by mouth daily. 90 tablet 0  . ezetimibe (ZETIA) 10 MG tablet Take 1 tablet (10 mg total) by mouth daily. 30 tablet 10  . metoprolol succinate (TOPROL XL) 25 MG 24 hr tablet Take 1 tablet (25 mg total) by mouth daily. 90 tablet 3  . nitroGLYCERIN (NITROSTAT) 0.4 MG SL tablet Place 1 tablet (0.4 mg total) under the tongue every 5 (five) minutes as needed for chest pain. 25 tablet 6  . Omega-3 Fatty Acids (EQL OMEGA 3 FISH OIL) 1400 MG CAPS Take 1 tablet by mouth daily.    . magnesium oxide (MAG-OX) 400 MG tablet Take 400 mg by mouth daily.    . vitamin E (VITAMIN E) 400 UNIT capsule Take 400 Units by mouth daily.     No current facility-administered medications on file prior to visit.    ALLERGIES: Allergies  Allergen Reactions  . Alendronate Shortness Of Breath and Swelling  . Atorvastatin Other (See Comments)    Muscle cramps  . Bactrim [Sulfamethoxazole-Trimethoprim] Other (See Comments)    Blisters/skin peeling, nausea  . Buprenorphine Hcl Nausea Only  . Morphine Nausea Only    OTHER  . Morphine And Related Nausea Only  . Prednisone Other (See Comments)    Unknown  . Sulfa Antibiotics Itching    Blisters/skin peeling  . Sulfasalazine Itching    Blisters/skin peeling  . Sulfonamide Derivatives Other (See Comments)    Blisters/skin peeling    FAMILY HISTORY: Family History  Problem Relation Age of Onset  . Hypertension Mother   . Cancer Mother        Colon  . Kidney disease Mother   . Memory loss Mother   . Heart attack Father   . Hypertension Brother   . Stroke Maternal Grandmother    . Memory loss Maternal Grandmother   . Cancer Maternal Grandfather        Intestinal    SOCIAL HISTORY: Social History   Socioeconomic History  . Marital status: Widowed    Spouse name: Not on file  . Number of children: Not on file  . Years of education: Not on file  . Highest education level: Not on file  Occupational History  . Not on file  Tobacco Use  . Smoking status: Never Smoker  . Smokeless tobacco: Never Used  Substance and Sexual Activity  . Alcohol use: Yes    Comment: occasional glass wine  . Drug use: No  . Sexual activity: Not Currently    Partners: Male    Comment: 1ST INTERCOURSE- 24, PARTNERS- 1 , MARRIED - 64 YRS   Other Topics Concern  . Not on file  Social History Narrative   Pt lives in 2 story home with her husband, Jonni Sanger   Has 2 adult daughters, 2 granddaughters   12th grade education   Retired Charity fundraiser  Social Determinants of Health   Financial Resource Strain:   . Difficulty of Paying Living Expenses:   Food Insecurity:   . Worried About Charity fundraiser in the Last Year:   . Arboriculturist in the Last Year:   Transportation Needs:   . Film/video editor (Medical):   Marland Kitchen Lack of Transportation (Non-Medical):   Physical Activity:   . Days of Exercise per Week:   . Minutes of Exercise per Session:   Stress:   . Feeling of Stress :   Social Connections:   . Frequency of Communication with Friends and Family:   . Frequency of Social Gatherings with Friends and Family:   . Attends Religious Services:   . Active Member of Clubs or Organizations:   . Attends Archivist Meetings:   Marland Kitchen Marital Status:   Intimate Partner Violence:   . Fear of Current or Ex-Partner:   . Emotionally Abused:   Marland Kitchen Physically Abused:   . Sexually Abused:     REVIEW OF SYSTEMS: Constitutional: No fevers, chills, or sweats, no generalized fatigue, change in appetite Eyes: No visual changes, double vision, eye pain Ear, nose and throat: No  hearing loss, ear pain, nasal congestion, sore throat Cardiovascular: No chest pain, palpitations Respiratory:  No shortness of breath at rest or with exertion, wheezes GastrointestinaI: No nausea, vomiting, diarrhea, abdominal pain, fecal incontinence Genitourinary:  No dysuria, urinary retention or frequency Musculoskeletal:  No neck pain, back pain Integumentary: No rash, pruritus, skin lesions Neurological: as above Psychiatric: + depression,+ insomnia, anxiety Endocrine: No palpitations, fatigue, diaphoresis, mood swings, change in appetite, change in weight, increased thirst Hematologic/Lymphatic:  No anemia, purpura, petechiae. Allergic/Immunologic: no itchy/runny eyes, nasal congestion, recent allergic reactions, rashes  PHYSICAL EXAM: Vitals:   01/23/20 1512  BP: (!) 145/60  Pulse: 64  SpO2: 97%   General: No acute distress Head:  Normocephalic/atraumatic Skin/Extremities: No rash, no edema Neurological Exam: alert and oriented to person, place, and time. No aphasia or dysarthria. Fund of knowledge is appropriate.  Recent and remote memory are impaired.  Attention and concentration are normal.  She is noted to have word-finding difficulties thinking of the word "batteries." Cranial nerves: Extraocular movements intact with no nystagmus. No facial asymmetry. Motor: moves all extremities symmetrically, at least anti-gravity x 4.     IMPRESSION: This is a pleasant 74 yo ambidextrous woman with a history of right chronic subdural hematoma s/p craniectomy in 2013, hyperlipidemia, hypertension, CAD, with worsening memory. MRI brain no acute changes, there was mild diffuse atrophy. Neurocognitive testing in 06/2019 indicated Mild Neurocognitive disorder, underlying etiology possibly Alzheimer's disease, in addition to prior subdural hematoma, exacerbated by psychiatric distress and sleep difficulties. She is on Donepezil, increase dose to 10mg  daily. We discussed consideration for  psychotherapy. We discussed recommendation for sleep study, she would like to try better sleep hygiene first. Repeat Neurocognitive testing will be scheduled for September 2021. Continue close supervision and monitor driving. Follow-up in 6 months, they know to call for any changes.   Thank you for allowing me to participate in her care.  Please do not hesitate to call for any questions or concerns.   Ellouise Newer, M.D.   CC: Dr. Dennard Schaumann

## 2020-02-18 ENCOUNTER — Encounter: Payer: Self-pay | Admitting: Family Medicine

## 2020-02-20 ENCOUNTER — Other Ambulatory Visit: Payer: Self-pay

## 2020-02-20 ENCOUNTER — Encounter: Payer: Self-pay | Admitting: Family Medicine

## 2020-02-20 ENCOUNTER — Ambulatory Visit (INDEPENDENT_AMBULATORY_CARE_PROVIDER_SITE_OTHER): Payer: Medicare Other | Admitting: Family Medicine

## 2020-02-20 VITALS — BP 138/60 | HR 70 | Temp 97.1°F | Resp 14 | Ht 65.0 in | Wt 125.0 lb

## 2020-02-20 DIAGNOSIS — I2510 Atherosclerotic heart disease of native coronary artery without angina pectoris: Secondary | ICD-10-CM | POA: Diagnosis not present

## 2020-02-20 DIAGNOSIS — G3184 Mild cognitive impairment, so stated: Secondary | ICD-10-CM

## 2020-02-20 LAB — LIPID PANEL
Cholesterol: 167 mg/dL (ref <200)
HDL: 54 mg/dL (ref >=50)
LDL Cholesterol (Calc): 85 mg/dL (calc)
Non-HDL Cholesterol (Calc): 113 mg/dL (calc) (ref <130)
Total CHOL/HDL Ratio: 3.1 (calc) (ref <5.0)
Triglycerides: 184 mg/dL — ABNORMAL HIGH (ref <150)

## 2020-02-20 LAB — CBC WITH DIFFERENTIAL/PLATELET
Absolute Monocytes: 590 cells/uL (ref 200–950)
Basophils Absolute: 83 cells/uL (ref 0–200)
Basophils Relative: 1.4 %
Eosinophils Absolute: 89 cells/uL (ref 15–500)
Eosinophils Relative: 1.5 %
HCT: 40.6 % (ref 35.0–45.0)
Hemoglobin: 13.4 g/dL (ref 11.7–15.5)
Lymphs Abs: 1345 cells/uL (ref 850–3900)
MCH: 31.3 pg (ref 27.0–33.0)
MCHC: 33 g/dL (ref 32.0–36.0)
MCV: 94.9 fL (ref 80.0–100.0)
MPV: 11.8 fL (ref 7.5–12.5)
Monocytes Relative: 10 %
Neutro Abs: 3794 cells/uL (ref 1500–7800)
Neutrophils Relative %: 64.3 %
Platelets: 221 10*3/uL (ref 140–400)
RBC: 4.28 10*6/uL (ref 3.80–5.10)
RDW: 13.1 % (ref 11.0–15.0)
Total Lymphocyte: 22.8 %
WBC: 5.9 10*3/uL (ref 3.8–10.8)

## 2020-02-20 LAB — COMPLETE METABOLIC PANEL WITH GFR
AG Ratio: 2 (calc) (ref 1.0–2.5)
ALT: 15 U/L (ref 6–29)
AST: 19 U/L (ref 10–35)
Albumin: 4.4 g/dL (ref 3.6–5.1)
Alkaline phosphatase (APISO): 60 U/L (ref 37–153)
BUN/Creatinine Ratio: 21 (calc) (ref 6–22)
BUN: 20 mg/dL (ref 7–25)
CO2: 31 mmol/L (ref 20–32)
Calcium: 9.6 mg/dL (ref 8.6–10.4)
Chloride: 105 mmol/L (ref 98–110)
Creat: 0.94 mg/dL — ABNORMAL HIGH (ref 0.60–0.93)
GFR, Est African American: 69 mL/min/{1.73_m2} (ref >=60)
GFR, Est Non African American: 60 mL/min/{1.73_m2} (ref >=60)
Globulin: 2.2 g/dL (calc) (ref 1.9–3.7)
Glucose, Bld: 93 mg/dL (ref 65–99)
Potassium: 5.4 mmol/L — ABNORMAL HIGH (ref 3.5–5.3)
Sodium: 142 mmol/L (ref 135–146)
Total Bilirubin: 0.5 mg/dL (ref 0.2–1.2)
Total Protein: 6.6 g/dL (ref 6.1–8.1)

## 2020-02-20 MED ORDER — ALPRAZOLAM 0.25 MG PO TABS: 0.2500 mg | ORAL_TABLET | Freq: Every evening | ORAL | 0 refills | Status: DC | PRN

## 2020-02-20 NOTE — Progress Notes (Signed)
Acute Office Visit  Subjective:    Patient ID: Nancy [name], female    DOB: 1946-10-04, 74 y.o.   MRN: [id number]  Chief Complaint  Patient presents with  . Medication Management  . Medication Refill  . Trouble tasting food    HPI  1/39 Patient 74 year old Caucasian female who presents today with multiple medical complaints.  #1, the patient has not had a bowel movement for more than a week.  She states that she has had occasional small bowel movements that have been black in color but not a normal bowel movement in over a week.  Furthermore she reports episodic lower abdominal pain that she describes as a sharp sensation that hits in her lower pelvis.  This comes and goes without provocation.  She denies any dysuria or hematuria.  She denies any hematochezia however she does have constipation and occasional black stool.  The stool is not tarry.  She denies any fevers or chills or nausea or vomiting.  She also reports diffuse pain.  This is been a common symptom for the patient in the past.  However today she isolates the pain to her left shoulder, the joints on both hands, and the muscles in her hamstrings as well as gastrocnemius bilaterally.  She states that this occurs and hurts on a daily basis and is a dull aching pain.  She denies any fevers or chills or heliotrope rash.  She is on Zetia.  She also is a little more confused.  She has mild dementia and this seems to be steadily worsening.  She does report polyuria however I have avoided medication for overactive bladder due to the fear of anticholinergic side effects.  At that time, my plan was: Patient's abdominal pain I suspect is constipation.  This would explain episodic crampy-like lower abdominal pain and it also would match timewise with when the patient last reports having normal bowel movements.  I will obtain abdominal x-ray to evaluate for any evidence of fecal impaction.  However I will treat the patient empirically for  constipation.  I will have the patient take magnesium citrate and see if her symptoms improve.  Also given the location of the pain will obtain a urinalysis just to rule out a urinary tract infection.  Patient has had occasional episodes of black stool.  I will check a CBC to rule out significant anemia.  I will also send the patient home with fecal occult blood test to rule out a GI bleed however I suspect that it may just be abnormal discoloration.  Patient frequently complains of pain.  However given her advanced age, autoimmune diseases are also on the differential diagnosis.  I will check a rheumatoid factor, sedimentation rate to rule out polymyalgia rheumatica, and a CK level to rule out myopathy such as myositis or medication induced rhabdomyolysis.   02/20/20 Patient is still living by her.  She is still performing all of her independent activities of daily living such as buying her groceries, driving, preparing her meals.  She is not losing money.  She is not having a difficult time paying her bills.  She does have a daughter who is her power of attorney who lives in town and checks on her quite often.  She does have mild dementia.  She denies that this is getting worse.  She denies getting lost while she is driving.  She denies forgetting conversations.  She denies having a difficult time thinking of words or having conversations.  However she does have repetitive complaints today without any recollection of having discussed them before.  For instance she reports diffuse pain all over her body.  This was a discussion he had in January.  Lab work at that time did not show any evidence of an autoimmune disease.  I suspect fibromyalgia.  Patient has stopped her cholesterol medication in the past including her Zetia despite having a history of CABG in 2009.  Therefore I do not feel that stopping the Zetia is in her best interest.  Patient states that she can live with the pain.  She denies any  depression.  She is taking Lexapro.  She does report insomnia.  She states often at night she is unable to fall asleep.  She lies in bed and frequently feels anxious.  We discussed the risk of taking a sleeping pill including confusion and increased risk of falls.  I would recommend a low-dose Xanax given the short half-life if we were to take a sleeping pill for the patient.  She would like to try 0.25 mg Xanax to be used very sparingly only as absolutely needed to help with her back to sleep.  I strongly emphasized that she monitor for any balance issues or confusion on the medication.  She is due today to check fasting lab work.  Past Medical History:  Diagnosis Date  . Coronary atherosclerosis of native coronary artery   . Dyslipidemia   . Essential hypertension, benign   . History of colonic polyps   . Osteoporosis   . Subdural hematoma ([place]) 04/27/2012    Past Surgical History:  Procedure Laterality Date  . CORONARY ANGIOPLASTY WITH STENT PLACEMENT  2010   3 times  . CORONARY ARTERY BYPASS GRAFT  2009  . CRANIOTOMY  04/27/2012   Procedure: CRANIOTOMY HEMATOMA EVACUATION SUBDURAL;  Surgeon: Eustace Moore, MD;  Location: [facility];  Service: Neurosurgery;  Laterality: Right;  Right Frontal Burr hole for Subdural Hematoma  . NASAL SINUS SURGERY    . POLYPECTOMY      Family History  Problem Relation Age of Onset  . Hypertension Mother   . Cancer Mother        Colon  . Kidney disease Mother   . Memory loss Mother   . Heart attack Father   . Hypertension Brother   . Stroke Maternal Grandmother   . Memory loss Maternal Grandmother   . Cancer Maternal Grandfather        Intestinal    Social History   Socioeconomic History  . Marital status: Widowed    Spouse name: Not on file  . Number of children: Not on file  . Years of education: Not on file  . Highest education level: Not on file  Occupational History  . Not on file  Tobacco Use  . Smoking status: Never Smoker  .  Smokeless tobacco: Never Used  Substance and Sexual Activity  . Alcohol use: Yes    Comment: occasional glass wine  . Drug use: No  . Sexual activity: Not Currently    Partners: Male    Comment: 1ST INTERCOURSE- 33, PARTNERS- 1 , MARRIED - 78 YRS   Other Topics Concern  . Not on file  Social History Narrative   Pt lives in 2 story home with her husband, Jonni Sanger   Has 2 adult daughters, 2 granddaughters   12th grade education   Retired Charity fundraiser    Social Determinants of Radio broadcast assistant Strain:   .  Difficulty of Paying Living Expenses:   Food Insecurity:   . Worried About Charity fundraiser in the Last Year:   . Arboriculturist in the Last Year:   Transportation Needs:   . Film/video editor (Medical):   Marland Kitchen Lack of Transportation (Non-Medical):   Physical Activity:   . Days of Exercise per Week:   . Minutes of Exercise per Session:   Stress:   . Feeling of Stress :   Social Connections:   . Frequency of Communication with Friends and Family:   . Frequency of Social Gatherings with Friends and Family:   . Attends Religious Services:   . Active Member of Clubs or Organizations:   . Attends Archivist Meetings:   Marland Kitchen Marital Status:   Intimate Partner Violence:   . Fear of Current or Ex-Partner:   . Emotionally Abused:   Marland Kitchen Physically Abused:   . Sexually Abused:     Outpatient Medications Prior to Visit  Medication Sig Dispense Refill  . aspirin 81 MG tablet Take 81 mg by mouth daily.     . cyanocobalamin 1000 MCG tablet Take 1,000 mcg by mouth daily.    Marland Kitchen donepezil (ARICEPT) 10 MG tablet Take 1 tablet daily 90 tablet 3  . escitalopram (LEXAPRO) 20 MG tablet Take 1 tablet (20 mg total) by mouth daily. 90 tablet 0  . ezetimibe (ZETIA) 10 MG tablet Take 1 tablet (10 mg total) by mouth daily. 30 tablet 10  . metoprolol succinate (TOPROL XL) 25 MG 24 hr tablet Take 1 tablet (25 mg total) by mouth daily. 90 tablet 3  . nitroGLYCERIN (NITROSTAT) 0.4  MG SL tablet Place 1 tablet (0.4 mg total) under the tongue every 5 (five) minutes as needed for chest pain. 25 tablet 6  . Cholecalciferol (VITAMIN D-3 PO) Take 1 tablet by mouth every other day.     . magnesium oxide (MAG-OX) 400 MG tablet Take 400 mg by mouth daily.    . Omega-3 Fatty Acids (EQL OMEGA 3 FISH OIL) 1400 MG CAPS Take 1 tablet by mouth daily.    . vitamin E (VITAMIN E) 400 UNIT capsule Take 400 Units by mouth daily.     No facility-administered medications prior to visit.    Allergies  Allergen Reactions  . Alendronate Shortness Of Breath and Swelling  . Atorvastatin Other (See Comments)    Muscle cramps  . Bactrim [Sulfamethoxazole-Trimethoprim] Other (See Comments)    Blisters/skin peeling, nausea  . Buprenorphine Hcl Nausea Only  . Morphine Nausea Only    OTHER  . Morphine And Related Nausea Only  . Prednisone Other (See Comments)    Unknown  . Sulfa Antibiotics Itching    Blisters/skin peeling  . Sulfasalazine Itching    Blisters/skin peeling  . Sulfonamide Derivatives Other (See Comments)    Blisters/skin peeling    Review of Systems  All other systems reviewed and are negative.      Objective:    Physical Exam Constitutional:      General: She is not in acute distress.    Appearance: Normal appearance. She is not ill-appearing or toxic-appearing.  Cardiovascular:     Rate and Rhythm: Normal rate and regular rhythm.     Heart sounds: Normal heart sounds.  Pulmonary:     Effort: Pulmonary effort is normal.     Breath sounds: Normal breath sounds. No wheezing, rhonchi or rales.  Abdominal:     General: Abdomen is flat. Bowel  sounds are normal. There is no distension.     Palpations: Abdomen is soft.     Tenderness: There is no abdominal tenderness. There is no guarding.  Neurological:     Mental Status: She is alert.       Assessment & Plan:   Problem List Items Addressed This Visit    Coronary artery disease involving native coronary  artery of native heart without angina pectoris   Relevant Orders   CBC with Differential/Platelet   COMPLETE METABOLIC PANEL WITH GFR   Lipid panel   Mild neurocognitive disorder - Primary     Continue Aricept 10 mg a day.  At the present, we will not add Namenda.  I will give the patient a prescription for Xanax 0.25 mg every 8 hours as needed for anxiety or for insomnia.  I cautioned the patient that this medication can potentially cause confusion and falls.  I warned the patient to monitor for this and to stop the medication immediately should she see any of these side effects develop.  Her blood pressure today is outstanding at 138/60.  I will check a CBC, CMP, and lipid panel.  Her goal LDL cholesterol is less than 70 given her history of CABG.  Her immunizations are up-to-date.  She states that her depression is currently well managed with her Lexapro.    Susy Frizzle, MD

## 2020-02-23 ENCOUNTER — Encounter: Payer: Self-pay | Admitting: Family Medicine

## 2020-04-12 ENCOUNTER — Other Ambulatory Visit: Payer: Self-pay

## 2020-04-12 ENCOUNTER — Ambulatory Visit (INDEPENDENT_AMBULATORY_CARE_PROVIDER_SITE_OTHER): Payer: Medicare Other | Admitting: Family Medicine

## 2020-04-12 ENCOUNTER — Telehealth: Payer: Self-pay | Admitting: Family Medicine

## 2020-04-12 VITALS — BP 130/60 | HR 68 | Temp 97.8°F | Ht 65.0 in | Wt 124.0 lb

## 2020-04-12 DIAGNOSIS — F5101 Primary insomnia: Secondary | ICD-10-CM

## 2020-04-12 DIAGNOSIS — G3184 Mild cognitive impairment, so stated: Secondary | ICD-10-CM

## 2020-04-12 DIAGNOSIS — I2510 Atherosclerotic heart disease of native coronary artery without angina pectoris: Secondary | ICD-10-CM

## 2020-04-12 MED ORDER — TRAZODONE HCL 50 MG PO TABS
50.0000 mg | ORAL_TABLET | Freq: Every evening | ORAL | 3 refills | Status: DC | PRN
Start: 2020-04-12 — End: 2020-06-18

## 2020-04-12 NOTE — Progress Notes (Signed)
Acute Office Visit  Subjective:    Patient ID: Nancy [name], female    DOB: 1945-12-09, 74 y.o.   MRN: [id number]  Patient has a history of cognitive impairment/dementia for which she takes Aricept 10 mg a day.  When I last saw the patient she was having trouble sleeping and we decided to try a very low-dose Xanax 0.25 mg at night to help her sleep.  She states that she has been taking a half a pill of this and that is not helping her.  Even a whole pill does not help her.  She denies any dizziness or sedation from the Xanax.  She denies any falls.  However she tosses and turns in bed at night unable to sleep.  This causes her to feel extremely fatigued and lethargic in the morning.  It is affecting her quality of life.  Past Medical History:  Diagnosis Date  . Coronary atherosclerosis of native coronary artery   . Dyslipidemia   . Essential hypertension, benign   . History of colonic polyps   . Osteoporosis   . 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 CORONARY ANGIOPLASTY WITH STENT PLACEMENT  2010   3 times  . CORONARY ARTERY BYPASS GRAFT  2009  . CRANIOTOMY  04/27/2012   Procedure: CRANIOTOMY HEMATOMA EVACUATION SUBDURAL;  Surgeon: Eustace Moore, MD;  Location: [facility];  Service: Neurosurgery;  Laterality: Right;  Right Frontal Burr hole for Subdural Hematoma  . NASAL SINUS SURGERY    . POLYPECTOMY      Family History  Problem Relation Age of Onset  . Hypertension Mother   . Cancer Mother        Colon  . Kidney disease Mother   . Memory loss Mother   . Heart attack Father   . Hypertension Brother   . Stroke Maternal Grandmother   . Memory loss Maternal Grandmother   . Cancer Maternal Grandfather        Intestinal    Social History   Socioeconomic History  . Marital status: Widowed    Spouse name: Not on file  . Number of children: Not on file  . Years of education: Not on file  . Highest education level:  Not on file  Occupational History  . Not on file  Tobacco Use  . Smoking status: Never Smoker  . Smokeless tobacco: Never Used  Vaping Use  . Vaping Use: Never used  Substance and Sexual Activity  . Alcohol use: Yes    Comment: occasional glass wine  . Drug use: No  . Sexual activity: Not Currently    Partners: Male    Comment: 1ST INTERCOURSE- 58, PARTNERS- 1 , MARRIED - 108 YRS   Other Topics Concern  . Not on file  Social History Narrative   Pt lives in 2 story home with her husband, Jonni Sanger   Has 2 adult daughters, 2 granddaughters   12th grade education   Retired Charity fundraiser    Social Determinants of Radio broadcast assistant Strain:   . Difficulty of Paying Living Expenses:   Food Insecurity:   . Worried About Charity fundraiser in the Last Year:   . Arboriculturist in the Last Year:   Transportation Needs:   . Film/video editor (Medical):   Marland Kitchen Lack of Transportation (Non-Medical):   Physical Activity:   . Days of Exercise per Week:   .  Minutes of Exercise per Session:   Stress:   . Feeling of Stress :   Social Connections:   . Frequency of Communication with Friends and Family:   . Frequency of Social Gatherings with Friends and Family:   . Attends Religious Services:   . Active Member of Clubs or Organizations:   . Attends Archivist Meetings:   Marland Kitchen Marital Status:   Intimate Partner Violence:   . Fear of Current or Ex-Partner:   . Emotionally Abused:   Marland Kitchen Physically Abused:   . Sexually Abused:     Outpatient Medications Prior to Visit  Medication Sig Dispense Refill  . ALPRAZolam (XANAX) 0.25 MG tablet Take 1 tablet (0.25 mg total) by mouth at bedtime as needed for sleep (use cautiously). 20 tablet 0  . aspirin 81 MG tablet Take 81 mg by mouth daily.     . cyanocobalamin 1000 MCG tablet Take 1,000 mcg by mouth daily.    Marland Kitchen donepezil (ARICEPT) 10 MG tablet Take 1 tablet daily 90 tablet 3  . escitalopram (LEXAPRO) 20 MG tablet Take 1 tablet  (20 mg total) by mouth daily. 90 tablet 0  . metoprolol succinate (TOPROL XL) 25 MG 24 hr tablet Take 1 tablet (25 mg total) by mouth daily. 90 tablet 3  . nitroGLYCERIN (NITROSTAT) 0.4 MG SL tablet Place 1 tablet (0.4 mg total) under the tongue every 5 (five) minutes as needed for chest pain. 25 tablet 6  . ezetimibe (ZETIA) 10 MG tablet Take 1 tablet (10 mg total) by mouth daily. 30 tablet 10   No facility-administered medications prior to visit.    Allergies  Allergen Reactions  . Alendronate Shortness Of Breath and Swelling  . Atorvastatin Other (See Comments)    Muscle cramps  . Bactrim [Sulfamethoxazole-Trimethoprim] Other (See Comments)    Blisters/skin peeling, nausea  . Buprenorphine Hcl Nausea Only  . Morphine Nausea Only    OTHER  . Morphine And Related Nausea Only  . Prednisone Other (See Comments)    Unknown  . Sulfa Antibiotics Itching    Blisters/skin peeling  . Sulfasalazine Itching    Blisters/skin peeling  . Sulfonamide Derivatives Other (See Comments)    Blisters/skin peeling    Review of Systems  All other systems reviewed and are negative.      Objective:    Physical Exam Constitutional:      General: She is not in acute distress.    Appearance: Normal appearance. She is not ill-appearing or toxic-appearing.  Cardiovascular:     Rate and Rhythm: Normal rate and regular rhythm.     Heart sounds: Normal heart sounds.  Pulmonary:     Effort: Pulmonary effort is normal.     Breath sounds: Normal breath sounds. No wheezing, rhonchi or rales.  Abdominal:     General: Abdomen is flat. Bowel sounds are normal. There is no distension.     Palpations: Abdomen is soft.     Tenderness: There is no abdominal tenderness. There is no guarding.  Neurological:     Mental Status: She is alert.       Assessment & Plan:   Mild neurocognitive disorder  Primary insomnia  I explained to the patient that I am concerned about adding stronger sleeping  medication potentially affecting her memory, her balance, and triggering falls.  After discussing her options we have both agreed to try trazodone 50 mg p.o. nightly and to discontinue the Xanax.  Reassess in 2 to 3 weeks.  I also recommended trying to get more exercise during the day as right now she is very sedentary and I believe this is adding to her insomnia.

## 2020-04-12 NOTE — Progress Notes (Signed)
  Chronic Care Management   Note  04/12/2020 Name: [name] MRN: [id number] DOB: May 18, 1946  Nancy [name] is a 74 y.o. year old female who is a primary care patient of Dennard Schaumann, Cammie Mcgee, MD. I reached out to [name] by phone today in response to a referral sent by [name]'s PCP, Susy Frizzle, MD.   [name] was given information about Chronic Care Management services today including:  1. CCM service includes personalized support from designated clinical staff supervised by her physician, including individualized plan of care and coordination with other care providers 2. 24/7 contact phone numbers for assistance for urgent and routine care needs. 3. Service will only be billed when office clinical staff spend 20 minutes or more in a month to coordinate care. 4. Only one practitioner may furnish and bill the service in a calendar month. 5. The patient may stop CCM services at any time (effective at the end of the month) by phone call to the office staff.   Patient agreed to services and verbal consent obtained.   Follow up plan:   [place]

## 2020-06-10 NOTE — Chronic Care Management (AMB) (Deleted)
Chronic Care Management Pharmacy  Name: Nancy [name]  MRN: [id number] DOB: 03-Dec-1945  Chief Complaint/ HPI  Nancy [name],  74 y.o. , female presents for their Initial CCM visit with the clinical pharmacist In office.  PCP : Nancy Frizzle, MD  Their chronic conditions include: {CHL AMB CHRONIC MEDICAL CONDITIONS:[phone]}  Office Visits: 04/12/2020 ([facility]) -   Hx of cognitive impairment, takes Aricept 6m  Having sleeping issues, full tablet of Xanax not helping  She d/c Xanax and started Trazodone 536m- assess again in 2 to 3 weeks  02/20/2020 ([facility]) -   Some evidence of cognitive repait  Xanax 0.2551mtarted   History of non-compliance with Zetia with history of CABG  Consult Visit 01/23/2020 ([facility]) -   Family fixer her pill box  Donepezil increased to 10 mg daily  Medications: Outpatient Encounter Medications as of 06/11/2020  Medication Sig  . ALPRAZolam (XANAX) 0.25 MG tablet Take 1 tablet (0.25 mg total) by mouth at bedtime as needed for sleep (use cautiously).  . aMarland Kitchenpirin 81 MG tablet Take 81 mg by mouth daily.   . cyanocobalamin 1000 MCG tablet Take 1,000 mcg by mouth daily.  . dMarland Kitchennepezil (ARICEPT) 10 MG tablet Take 1 tablet daily  . escitalopram (LEXAPRO) 20 MG tablet Take 1 tablet (20 mg total) by mouth daily.  . eMarland Kitchenetimibe (ZETIA) 10 MG tablet Take 1 tablet (10 mg total) by mouth daily.  . metoprolol succinate (TOPROL XL) 25 MG 24 hr tablet Take 1 tablet (25 mg total) by mouth daily.  . nitroGLYCERIN (NITROSTAT) 0.4 MG SL tablet Place 1 tablet (0.4 mg total) under the tongue every 5 (five) minutes as needed for chest pain.  . traZODone (DESYREL) 50 MG tablet Take 1 tablet (50 mg total) by mouth at bedtime as needed for sleep.   No facility-administered encounter medications on file as of 06/11/2020.     Current Diagnosis/Assessment:  Goals Addressed   None     Hypertension   BP today is:  {CHL HP UPSTREAM Pharmacist BP  [phone]}  Office blood pressures are  BP Readings from Last 3 Encounters:  04/12/20 130/60  02/20/20 138/60  01/23/20 (!) 145/60    Patient has failed these meds in the past: ***  Patient checks BP at home {CHL HP BP Monitoring Frequency:9784806808}  Patient home BP readings are ranging: *** Patient is currently {CHL Controlled/Uncontrolled:[phone]} on the following medications:   Metoprolol XL 72m65mily We discussed {CHL HP Upstream Pharmacy discussion:[phone]}  Plan  Continue {CHL HP Upstream Pharmacy Plans:[phone]}     and  CAD    Patient has failed these meds in past: *** Patient is currently {CHL Controlled/Uncontrolled:[phone]} on the following medications: \  ASA 81mg19m discussed:  {CHL HP Upstream Pharmacy discussion:[phone]}  Plan  Continue {CHL HP Upstream Pharmacy Plans:[phone]}  Hyperlipidemia   LDL goal < ***  Lipid Panel     Component Value Date/Time   CHOL 167 02/20/2020 1135   CHOL 185 10/30/2019 1619   TRIG 184 (H) 02/20/2020 1135   HDL 54 02/20/2020 1135   HDL 70 10/30/2019 1619   LDLCALC 85 02/20/2020 1135    Hepatic Function Latest Ref Rng & Units 02/20/2020 11/09/2019 06/12/2019  Total Protein 6.1 - 8.1 g/dL 6.6 6.7 6.9  Albumin 3.5 - 5.2 g/dL - - -  AST 10 - 35 U/L 19 19 25   ALT 6 - 29 U/L 15 13 29   Alk Phosphatase 39 - 117 U/L - - -  Total Bilirubin 0.2 - 1.2 mg/dL 0.5 0.7 0.4  Bilirubin, Direct 0.00 - 0.40 mg/dL - - -     The 10-year ASCVD risk score Nancy [name]., et al., 2013) is: 33.8%   Values used to calculate the score:     Age: 75 years     Sex: Female     Is Non-Hispanic African American: No     Diabetic: Yes     Tobacco smoker: No     Systolic Blood Pressure: 683 mmHg     Is BP treated: Yes     HDL Cholesterol: 54 mg/dL     Total Cholesterol: 167 mg/dL   Patient has failed these meds in past: *** Patient is currently {CHL Controlled/Uncontrolled:[phone]} on the following  medications:  . Zetia 31m daily  We discussed:  {CHL HP Upstream Pharmacy discussion:[phone]}  Plan  Continue {CHL HP Upstream Pharmacy PFGBMS:1115520802}Osteoporosis   Last DEXA Scan: August 2016    T-Score lumbar spine: -3.0    Vit D, 25-Hydroxy  Date Value Ref Range Status  06/23/2016 60 30 - 100 ng/mL Final    Comment:    Vitamin D Status           25-OH Vitamin D        Deficiency                <20 ng/mL        Insufficiency         20 - 29 ng/mL        Optimal             > or = 30 ng/mL   For 25-OH Vitamin D testing on patients on D2-supplementation and patients for whom quantitation of D2 and D3 fractions is required, the QuestAssureD 25-OH VIT D, (D2,D3), LC/MS/MS is recommended: order code [id number](patients > 2 yrs).      Patient is a candidate for pharmacologic treatment due to T-Score < -2.5 in lumbar spine  Patient has failed these meds in past: *** Patient is currently {CHL Controlled/Uncontrolled:[phone]} on the following medications:  . ***  We discussed:  Recommend [phone] units of vitamin D daily. Recommend 1200 mg of calcium daily from dietary and supplemental sources. Recommend weight-bearing and muscle strengthening exercises for building and maintaining bone density.  Plan  Continue {CHL HP Upstream Pharmacy PQAESL:7530051102}Vaccines   Reviewed and discussed patient's vaccination history.    Immunization History  Administered Date(s) Administered  . Fluad Quad(high Dose 65+) 08/21/2019  . Influenza,inj,Quad PF,6+ Mos 07/06/2013, 08/02/2014, 08/06/2015, 07/30/2016, 06/24/2017, 07/07/2018  . Influenza-Unspecified 11/02/2007, 07/10/2010, 07/17/2011, 07/27/2012  . Pneumococcal Conjugate-13 10/20/2013  . Pneumococcal Polysaccharide-23 07/06/2013  . Td 01/24/2010  . Tdap 01/24/2010, 08/28/2017  . Zoster 07/10/2010    Plan  Recommended patient receive *** vaccine in *** office.  Medication Management   . Miscellaneous medications:  *** . OTC's: *** . Patient currently uses SConservation officer, nature  Phone #  [phone]. Patient reports using *** method to organize medications and promote adherence. . Patient reports/denies missed doses of medication ***   CBeverly Milch PharmD Clinical Pharmacist BLeonardo([phone]

## 2020-06-11 ENCOUNTER — Ambulatory Visit: Payer: Medicare Other

## 2020-06-18 ENCOUNTER — Ambulatory Visit (INDEPENDENT_AMBULATORY_CARE_PROVIDER_SITE_OTHER): Payer: Medicare Other | Admitting: Family Medicine

## 2020-06-18 ENCOUNTER — Other Ambulatory Visit: Payer: Self-pay

## 2020-06-18 VITALS — BP 140/60 | HR 62 | Temp 96.1°F | Ht 65.0 in | Wt 125.0 lb

## 2020-06-18 DIAGNOSIS — F5101 Primary insomnia: Secondary | ICD-10-CM | POA: Diagnosis not present

## 2020-06-18 DIAGNOSIS — G3184 Mild cognitive impairment, so stated: Secondary | ICD-10-CM | POA: Diagnosis not present

## 2020-06-18 DIAGNOSIS — I2510 Atherosclerotic heart disease of native coronary artery without angina pectoris: Secondary | ICD-10-CM | POA: Diagnosis not present

## 2020-06-18 MED ORDER — TRAZODONE HCL 100 MG PO TABS
100.0000 mg | ORAL_TABLET | Freq: Every evening | ORAL | 2 refills | Status: DC | PRN
Start: 2020-06-18 — End: 2022-01-21

## 2020-06-18 NOTE — Progress Notes (Signed)
Acute Office Visit  Subjective:    Patient ID: Nancy [name], female    DOB: 12-Jun-1946, 74 y.o.   MRN: [id number]  Patient has a history of cognitive impairment/dementia for which she takes Aricept 10 mg a day.  Patient is a widow.  She lives alone.  However she has 2 daughters who live close by who check on her regularly.  Her daughters are her power of attorney and help monitor her finances.  She no longer cooks or prepares her meals other than to heat a frozen dinner.  Neighbors also take her out to dinner quite frequently and check in on her.  She states that she does not cook or bake any longer.  she continues to have trouble sleeping.  She states that she will go to sleep at 9:00 at night.  She will read or toss and turn in bed.  She is uncertain of what time she will finally go to sleep however she frequently wakes up around 2 or 3 AM and is then unable to go back to sleep.  This leaves her feeling extremely tired for the rest of the day.  The trazodone does not seem to be helping her go to sleep.  She is interested in trying something stronger to help her sleep.  She denies any chest pain.  She denies any shortness of breath.  She does report stiffness and pain in her joints.  She is not getting any regular exercise.  Past Medical History:  Diagnosis Date  . Coronary atherosclerosis of native coronary artery   . Dyslipidemia   . Essential hypertension, benign   . History of colonic polyps   . Osteoporosis   . 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 CORONARY ANGIOPLASTY WITH STENT PLACEMENT  2010   3 times  . CORONARY ARTERY BYPASS GRAFT  2009  . CRANIOTOMY  04/27/2012   Procedure: CRANIOTOMY HEMATOMA EVACUATION SUBDURAL;  Surgeon: Eustace Moore, MD;  Location: [place] [facility];  Service: Neurosurgery;  Laterality: Right;  Right Frontal Burr hole for Subdural Hematoma  . NASAL SINUS SURGERY    . POLYPECTOMY      Family History    Problem Relation Age of Onset  . Hypertension Mother   . Cancer Mother        Colon  . Kidney disease Mother   . Memory loss Mother   . Heart attack Father   . Hypertension Brother   . Stroke Maternal Grandmother   . Memory loss Maternal Grandmother   . Cancer Maternal Grandfather        Intestinal    Social History   Socioeconomic History  . Marital status: Widowed    Spouse name: Not on file  . Number of children: Not on file  . Years of education: Not on file  . Highest education level: Not on file  Occupational History  . Not on file  Tobacco Use  . Smoking status: Never Smoker  . Smokeless tobacco: Never Used  Vaping Use  . Vaping Use: Never used  Substance and Sexual Activity  . Alcohol use: Yes    Comment: occasional glass wine  . Drug use: No  . Sexual activity: Not Currently    Partners: Male    Comment: 1ST INTERCOURSE- 4, PARTNERS- 1 , MARRIED - 66 YRS   Other Topics Concern  . Not on file  Social History Narrative   Pt lives in  2 story home with her husband, Jonni Sanger   Has 2 adult daughters, 2 granddaughters   12th grade education   Retired Charity fundraiser    Social Determinants of Radio broadcast assistant Strain:   . Difficulty of Paying Living Expenses: Not on file  Food Insecurity:   . Worried About Charity fundraiser in the Last Year: Not on file  . Ran Out of Food in the Last Year: Not on file  Transportation Needs:   . Lack of Transportation (Medical): Not on file  . Lack of Transportation (Non-Medical): Not on file  Physical Activity:   . Days of Exercise per Week: Not on file  . Minutes of Exercise per Session: Not on file  Stress:   . Feeling of Stress : Not on file  Social Connections:   . Frequency of Communication with Friends and Family: Not on file  . Frequency of Social Gatherings with Friends and Family: Not on file  . Attends Religious Services: Not on file  . Active Member of Clubs or Organizations: Not on file  . Attends  Archivist Meetings: Not on file  . Marital Status: Not on file  Intimate Partner Violence:   . Fear of Current or Ex-Partner: Not on file  . Emotionally Abused: Not on file  . Physically Abused: Not on file  . Sexually Abused: Not on file    Outpatient Medications Prior to Visit  Medication Sig Dispense Refill  . ALPRAZolam (XANAX) 0.25 MG tablet Take 1 tablet (0.25 mg total) by mouth at bedtime as needed for sleep (use cautiously). 20 tablet 0  . aspirin 81 MG tablet Take 81 mg by mouth daily.     . cyanocobalamin 1000 MCG tablet Take 1,000 mcg by mouth daily.    Marland Kitchen donepezil (ARICEPT) 10 MG tablet Take 1 tablet daily 90 tablet 3  . escitalopram (LEXAPRO) 20 MG tablet Take 1 tablet (20 mg total) by mouth daily. 90 tablet 0  . metoprolol succinate (TOPROL XL) 25 MG 24 hr tablet Take 1 tablet (25 mg total) by mouth daily. 90 tablet 3  . nitroGLYCERIN (NITROSTAT) 0.4 MG SL tablet Place 1 tablet (0.4 mg total) under the tongue every 5 (five) minutes as needed for chest pain. 25 tablet 6  . traZODone (DESYREL) 50 MG tablet Take 1 tablet (50 mg total) by mouth at bedtime as needed for sleep. 30 tablet 3  . ezetimibe (ZETIA) 10 MG tablet Take 1 tablet (10 mg total) by mouth daily. 30 tablet 10   No facility-administered medications prior to visit.    Allergies  Allergen Reactions  . Alendronate Shortness Of Breath and Swelling  . Atorvastatin Other (See Comments)    Muscle cramps  . Bactrim [Sulfamethoxazole-Trimethoprim] Other (See Comments)    Blisters/skin peeling, nausea  . Buprenorphine Hcl Nausea Only  . Morphine Nausea Only    OTHER  . Morphine And Related Nausea Only  . Prednisone Other (See Comments)    Unknown  . Sulfa Antibiotics Itching    Blisters/skin peeling  . Sulfasalazine Itching    Blisters/skin peeling  . Sulfonamide Derivatives Other (See Comments)    Blisters/skin peeling    Review of Systems  All other systems reviewed and are negative.        Objective:    Physical Exam Constitutional:      General: She is not in acute distress.    Appearance: Normal appearance. She is not ill-appearing or toxic-appearing.  Cardiovascular:  Rate and Rhythm: Normal rate and regular rhythm.     Heart sounds: Normal heart sounds.  Pulmonary:     Effort: Pulmonary effort is normal.     Breath sounds: Normal breath sounds. No wheezing, rhonchi or rales.  Abdominal:     General: Abdomen is flat. Bowel sounds are normal. There is no distension.     Palpations: Abdomen is soft.     Tenderness: There is no abdominal tenderness. There is no guarding.  Neurological:     Mental Status: She is alert.       Assessment & Plan:   Mild neurocognitive disorder  Primary insomnia  This is similar to the discussion we had about 2 months ago.  I am very concerned about adding stronger sleeping pills due to the potential risk of sedation, dizziness, falls, and confusion.  Therefore we will try increasing the trazodone to 100 mg at night and see how she does over the next few weeks.  If this does not work, I would discontinue trazodone and replace it with Ambien however we would need to monitor for any deterioration in her condition after we do that.  I have also recommended getting 20 to 30 minutes of exercise in the early afternoon to also help with insomnia as I believe physical deconditioning and lack of activity and loneliness is also adding to her insomnia.  I believe depression is also playing a role however at the present time, the patient states that she is not depressed and she does not feel like she needs additional medication to help with depression.

## 2020-06-25 ENCOUNTER — Ambulatory Visit: Payer: Medicare Other

## 2020-06-25 ENCOUNTER — Encounter: Payer: Self-pay | Admitting: Counselor

## 2020-06-25 ENCOUNTER — Ambulatory Visit (INDEPENDENT_AMBULATORY_CARE_PROVIDER_SITE_OTHER): Payer: Medicare Other | Admitting: Counselor

## 2020-06-25 ENCOUNTER — Other Ambulatory Visit: Payer: Self-pay

## 2020-06-25 DIAGNOSIS — F0280 Dementia in other diseases classified elsewhere without behavioral disturbance: Secondary | ICD-10-CM | POA: Diagnosis not present

## 2020-06-25 DIAGNOSIS — R413 Other amnesia: Secondary | ICD-10-CM

## 2020-06-25 DIAGNOSIS — G301 Alzheimer's disease with late onset: Secondary | ICD-10-CM | POA: Diagnosis not present

## 2020-06-25 DIAGNOSIS — F419 Anxiety disorder, unspecified: Secondary | ICD-10-CM | POA: Diagnosis not present

## 2020-06-25 DIAGNOSIS — F09 Unspecified mental disorder due to known physiological condition: Secondary | ICD-10-CM

## 2020-06-25 NOTE — Progress Notes (Signed)
[facility]  Patient Name: Nancy [name] MRN: [id number] Date of Birth: [birth year] Age: 74 y.o. Education: 12 years (equivalent)  Measurement properties of test scores: IQ, Index, and Standard Scores (SS): Mean = 100; Standard Deviation = 15 Scaled Scores (Ss): Mean = 10; Standard Deviation = 3 Z scores (Z): Mean = 0; Standard Deviation = 1 T scores (T); Mean = 50; Standard Deviation = 10  TEST SCORES:    Note: This summary of test scores accompanies the interpretive report and should not be interpreted by unqualified individuals or in isolation without reference to the report. Test scores are relative to age, gender, and educational history as available and appropriate.   Mental Status Screening     Total Score Descriptor  MoCA 14 Moderate Dementia      Expected Functioning        Wide Range Achievement Test: Standard/Scaled Score Percentile      Word Reading 86 18      Attention/Processing Speed        Neuropsychological Assessment Battery (Attention Module, Form 1): T-score Percentile      Digits Forward 37 9      Digits Backwards 40 16      Repeatable Battery for the Assessment of Neuropsychological Status (Form A): Scaled Score Percentile      Coding 2 <1      Language        Neuropsychological Assessment Battery (Language Module, Form 1): T-score Percentile      Naming   (13) 19 <1      Verbal Fluency: T-score Percentile      Controlled Oral Word Association (F-A-S) 18 <1      Semantic Fluency (Animals) 20 <1      Memory:        Neuropsychological Assessment Battery (Memory Module, Form 1): T-score Percentile      List Learning           List A Immediate Recall   (2, 4, 3) 19 <1         List B Immediate Recall   (2) 38 12         List A Short Delayed Recall   (0) 22 <1         List A Long Delayed Recall   (1) 31 3         List A Percent Retention   (0 %) --- <1         List A Long Delayed Yes/No Recognition Hits    (12) --- 88         List A Long Delayed Yes/No Recognition False Alarms   (15) --- <1         List A Recognition Discriminability Index --- <1      Repeatable Battery for the Assessment of Neuropsychological Status (Form A): Scaled Score Percentile         Figure Recall   (2) 3 1      Visuospatial/Constructional Functioning        Repeatable Battery for the Assessment of Neuropsychological Status (Form A): Standard/Scaled Score Percentile     Visuospatial/Constructional Index 96 39         Figure Copy   (16) 7 16         Judgment of Line Orientation   (18) --- 51-75      Executive Functioning        Trail Making Test: T-Score Percentile  Part A 37 9      Part B DC DC      [place] Diagnostic Aphasia Exam: Raw Score Scaled Score      Complex Ideational Material 10 7      Clock Drawing Raw Score Descriptor      Command 6 Mild Impairment      Rating Scales        Clinical Dementia Rating Raw Score Descriptor      Sum of Boxes 4.5 Mild Dementia      Global Score 1.0 Mild Dementia      Quick Dementia Rating System Raw Score Descriptor      Sum of Boxes 4 Very Mild Dementia      Total Score 7.5 Mild Dementia  Geriatric Depression Scale - Short Form 5 Negative   Daisy Lites V. Nicole Kindred PsyD, [place] Clinical Neuropsychologist

## 2020-06-25 NOTE — Progress Notes (Signed)
[facility]  Patient Name: Nancy [name] MRN: [id number] Date of Birth: 1946-07-16 Age: 74 y.o. Education: 12 years (10 years [place] equivalent)  Referral Circumstances and Background Information  Nancy [name] is a 74 y.o., right-hand dominant, widowed woman with a history of memory and thinking problems following a subdural hemorrhage requiring craniotomy (in 02-05-12) and progressive worsening over time. She was referred by Dr. Delice Lesch, who has followed her for the same since 2018/02/04. She was also evaluated in September, 2020 by Dr. Melvyn Novas with [facility] who felt that she had MCI but was concerned about Alzheimer's disease given very low memory test scores. She presents for reevaluation in the service of diagnostic clarification and disease monitoring.   On interview, the patient presented as quite anxious and seemed hesitant to disclose any cognitive problems. The main issues for her are her sleep problems. Her daughter also seemed hesitant to disclose negative information but admits that she has noticed memory and thinking problems since the surgery and moreso after the patient's husband died in 02/04/2018. At that time, there was also a "big increase in anxiety" and the patient's sleep problems. The first difficulties noticed were increased problems with word finding. In contrast to their previous report, they are saying that they actually think her issues are better now than they were a year or two ago. Despite that fact, she does have some functional impairment, they attempted to scaffold her to managing her own medications and finances after her husband passed but it was too challenging and her daughter Nancy [name] is now doing those things for her.They are also looking into different living circumstances, such as a senior living community or something closer to her daughter. With respect to mood, the patient stated that she is "in between" and had a hard time  summarizing. She presented as quite anxious and distressed about the possibility of having cognitive problems today and her daughter admits that she does get anxious. She gets upset about things on the news, her husband was in the [employer] and she was very upset about the recent [place] situation. She doesn't sleep well, which is a longstanding issue that worsened after her husband passed. She takes trazodone but despite that she frequently awakens around 3:00am and can't go back to bed. She stated that her energy is variable. Her weight is stable, she feels hungry but doesn't have a taste for food, although she has not been losing weight and has infact gained 6lbs over the past year as per her vitals.   With respect to functioning, the patient no longer cooks, she stopped over the past few years, she wasn't clear if she can still cook if she wants to. She is getting lost driving, she can get "turned around easily." She went to a dentist appointment the other day and it took 6 hours. She tries to do GPS but has a hard time even with that assistance. She denied any difficulties with respect to community utilization, she is still getting her groceries and the like, although she does tend to go to the same places. She is independent with respect to functioning around the house and is adequately maintaining her house, doing laundry, and other necessities. She is no longer using the computer, they said she stopped sometime after the surgery. She has a smart phone but doesn't use any of the smart features. Her daughter is now paying the bills, she was unable to learn how to manage them after  her husband died. She got "too overwhelmed and frustrated." They are denying any problems with respect to orientation or keeping track of appointments. Her other daughter manages her medication, but the patient is reliable about taking them from her pill minder (it's not clear anyone is checking).   Past Medical History and  Review of Relevant Studies   Patient Active Problem List   Diagnosis Date Noted  . Mild neurocognitive disorder 07/19/2019  . Osteoporosis   . Chest pain, unspecified 08/27/2009  . Hyperlipidemia 08/01/2009  . Essential hypertension, benign 08/01/2009  . Coronary artery disease involving native coronary artery of native heart without angina pectoris 08/01/2009    Review of Neuroimaging and Relevant Medical History: The patient has an MRI brain from 05/18/2018 that shows a significant burden of volume loss, mildly disproportionate in the posterior portions of the brain and mesial temporal lobes, which measure a sheltens of approximately 2 bilaterally, which is abnormal for this patient's age. There are no significant areas of leukoaraiosis or vascular change. There are some minimal areas of susceptibility artifact in the right cerebellum, noted by radiology, felt to likely represent sequelae of old microhemorrhage. 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   Current Outpatient Medications  Medication Sig Dispense Refill  . ALPRAZolam (XANAX) 0.25 MG tablet Take 1 tablet (0.25 mg total) by mouth at bedtime as needed for sleep (use cautiously). 20 tablet 0  . aspirin 81 MG tablet Take 81 mg by mouth daily.     . cyanocobalamin 1000 MCG tablet Take 1,000 mcg by mouth daily.    Marland Kitchen donepezil (ARICEPT) 10 MG tablet Take 1 tablet daily 90 tablet 3  . escitalopram (LEXAPRO) 20 MG tablet Take 1 tablet (20 mg total) by mouth daily. 90 tablet 0  . ezetimibe (ZETIA) 10 MG tablet Take 1 tablet (10 mg total) by mouth daily. 30 tablet 10  . metoprolol succinate (TOPROL XL) 25 MG 24 hr tablet Take 1 tablet (25 mg total) by mouth daily. 90 tablet 3  . nitroGLYCERIN (NITROSTAT) 0.4 MG SL tablet Place 1 tablet (0.4 mg total) under the tongue every 5 (five) minutes as needed for chest pain. 25 tablet 6  . traZODone (DESYREL) 100 MG tablet  Take 1 tablet (100 mg total) by mouth at bedtime as needed for sleep. 30 tablet 2   No current facility-administered medications for this visit.    Family History  Problem Relation Age of Onset  . Hypertension Mother   . Cancer Mother        Colon  . Kidney disease Mother   . Memory loss Mother   . Heart attack Father   . Hypertension Brother   . Stroke Maternal Grandmother   . Memory loss Maternal Grandmother   . Cancer Maternal Grandfather        Intestinal   There is a family history of dementia, her maternal grandmother reportedly had dementia that developed when she was in her 35s. They denied any other family history of dementia. She doesn't know much about her biological father's family. There is no  family history of psychiatric illness.  Psychosocial History  Developmental, Educational and Employment History: Nancy [name] completed school in [place], equivalent to a [place] high school diploma level education. She is a native [place] speaker but has good faculty with the [place] language, having learned it at a young age in [place]. She denied ever having any learning problems  or being held back. 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 She retired in her 79s when she moved to the [place] area from. As per Dr. Gustavus Bryant previous documentation she also worked in childcare.   Psychiatric History: The patient has a minimal formal treatment history but as per available documentation, her health providers have been concerned about anxiety and depression and she is prescribed Alprazolam. She largely minimized any current mental health issues. She is taking Lexapro prescribed by Dr. Delice Lesch.   Substance Use History: Nancy [name] does not use alcohol, has never smoked, and does not use any illicit substances.   Relationship History and Living Cimcumstances: The patient was married for  approximately 24 years (she thought it was nearly 69 but her daughter corrected her). She has two daughter, Nancy [name] and Nancy [name], who are each involved in her care.   Mental Status and Behavioral Observations  Sensorium/Arousal: The patient's level of arousal was awake and alert. Hearing and vision were adequate with correction (reading glasses) for testing purposes. Orientation: Nancy [name] was alert and oriented to person, time, and situation although she was not sure the name of the practice she was receiving treatment at or the healthcare system Appearance: Dressed in appropriate, casual clothing with adequate grooming and hygiene.  Behavior: Nancy [name] presented as quite anxious. During testing I completed with her, she got progressively more worked up, to the point that her anxiety likely did interfere with her best performance. This was processed with her and we were going to discontinue but after some time, she wished to attempt testing. As per technician she was compliant with all testing procedures but was quite anxious and discontinued prematurely on Trail Making Test B.  Speech/language: Ms. Nancy [name] in good English although occasionally complained that she knew the word for something in [place] and not [place]. She had occasional semantic paraphasia but no phonologic paraphasia. Occasional word finding problems noticed.  Gait/Posture: Not well observed, normal on last exam with Dr. Delice Lesch.  Movement: No overt signs/symptoms of movement disorder Social Comportment: Appropriate, did get very upset about testing Mood: "In the middle" Affect: Anxious, dysphoric, and tearful at times during testing Thought process/content: Thought process was somewhat meandering and she had a hard time providing history without her daughter's assistance. Thought content was appropriate.  Safety: Any thoughts of harming self or others were denied on direct questioning Insight: Diminished, patient seems aware of but  minimizes cognitive concerns.   Montreal Cognitive Assessment  06/25/2020 06/19/2019 11/18/2018 04/06/2018  Visuospatial/ Executive (0/5) 2 3 4 5   Naming (0/3) 3 3 3 3   Attention: Read list of digits (0/2) 2 2 2 2   Attention: Read list of letters (0/1) 1 1 1 1   Attention: Serial 7 subtraction starting at 100 (0/3) 0 2 3 3   Language: Repeat phrase (0/2) 1 1 0 2  Language : Fluency (0/1) 0 0 0 0  Abstraction (0/2) 0 2 0 2  Delayed Recall (0/5) 0 3 4 1   Orientation (0/6) 4 6 5 6   Total 13 23 22 25   Adjusted Score (based on education) 14 24 23  -   Test Procedures  Wide Range Achievement Test - 4             Word Reading Neuropsychological Assessment Battery  List Learning  Naming  Digit Span Repeatable Battery for the Assessment of Neuropsychological  Status (Form A)  Figure Copy  Judgment of Line Orientation  Coding  Figure Recall Controlled Oral Word Association (F-A-S) Semantic Fluency (Animals) Trail Making Test A & B Complex Ideational Material Geriatric Depression Scale - Short Form Quick Dementia Rating System (completed by daughter Nancy [name])  Plan  LADONNE SHARPLES was seen for a psychiatric diagnostic evaluation and neuropsychological testing. She is a 74 year old, widowed, right-handed woman with significant test anxiety and a history of difficulties with memory and thinking since 2012-01-17 after having a subdural hemorrhage with craniotomy. Her daughter reported that she mostly recovered from that incident but she has had increasing difficulties since the death of her husband in Jan 16, 2018. She is no longer managing finances or medications. She had a hard time even getting through cognitive screening today. I processed that with her and offered for her to forego testing and/or return another day, but she was able to calm down and we were able to get through an abbreviated battery with no further issue. Full and complete note with impressions, recommendations, and interpretation of test data to  follow.   Viviano Simas Nicole Kindred, PsyD, [place] Clinical Neuropsychologist  Informed Consent and Coding/Compliance  Risks and benefits of the evaluation were discussed with the patient prior to all testing procedures. I conducted a clinical interview and neuropsychological testing (at least two tests) with Nancy [name] and Lamar Benes, B.S. (Technician) administered additional test procedures. The patient was able to tolerate the testing procedures and the patient (and/or family if applicable) is likely to benefit from further follow up to receive the diagnosis and treatment recommendations, which will be rendered at the next encounter. Billing below reflects technician time, my direct face-to-face time with the patient, time spent in test administration, and time spent in professional activities including but not limited to: neuropsychological test interpretation, integration of neuropsychological test data with clinical history, report preparation, treatment planning, care coordination, and review of diagnostically pertinent medical history or studies.   Services associated with this encounter: Clinical Interview [phone]) plus 60 minutes (43568; Neuropsychological Evaluation by Professional)  110 minutes (61683; Neuropsychological Evaluation by Professional, Adl.) 19 minutes (72902; Test Administration by Professional) 30 minutes (11155; Neuropsychological Testing by Technician) 45 minutes (20802; Neuropsychological Testing by Technician, Adl.)

## 2020-06-25 NOTE — Progress Notes (Signed)
   Psychometrist Note   Cognitive testing was administered to Nancy [name] by Lamar Benes, B.S. (Technician) under the supervision of Alphonzo Severance, Psy.D., ABN. Ms. Cardamone was able to tolerate all test procedures. Dr. Nicole Kindred met with the patient as needed to manage any emotional reactions to the testing procedures. Rest breaks were offered.    The battery of tests administered was selected by Dr. Nicole Kindred with consideration to the patient's current level of functioning, the nature of her symptoms, emotional and behavioral responses during the interview, level of literacy, observed level of motivation/effort, and the nature of the referral question. This battery was communicated to the psychometrist. Communication between Dr. Nicole Kindred and the psychometrist was ongoing throughout the evaluation and Dr. Nicole Kindred was immediately accessible at all times. Dr. Nicole Kindred provided supervision to the technician on the date of this service, to the extent necessary to assure the quality of all services provided.    Nancy [name] will return in approximately one week for an interactive feedback session with Dr. Nicole Kindred, at which time test performance, clinical impressions, and treatment recommendations will be reviewed in detail. The patient understands she can contact our office should she require our assistance before this time.   A total of 75 minutes of billable time were spent with Nancy [name] by the technician, including test administration and scoring time. Billing for these services is reflected in Dr. Les Pou note.   This note reflects time spent with the psychometrician and does not include test scores, clinical history, or any interpretations made by Dr. Nicole Kindred. The full report will follow in a separate note.

## 2020-06-26 ENCOUNTER — Other Ambulatory Visit: Payer: Self-pay | Admitting: Neurology

## 2020-07-01 NOTE — Progress Notes (Signed)
[facility]  Patient Name: Nancy [name] MRN: [id number] Date of Birth: [birth year] Age: 74 y.o. 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 Her cognitive changes were noticed mainly after her husband passed in 2019 although she and her daughter reported today that they actually think her memory and thinking problems have been getting better since then. She was seen by Dr. Melvyn Novas with [facility] on 07/18/2019 who noted memory storage type problems and an impression of mild cognitive impairment with concern for Alzheimer's disease. She has an MRI that shows mildly disproportionate atrophy in the posterior portions of the brain and mesial temporal structures and some minimal susceptibility in the right inferior cerebellar hemisphere consistent with microhemorrhage. She returns for reevaluation.  Nancy [name] presented as extremely anxious about the cognitive testing, had to take a break during the MoCA, and was offered to reschedule. Nevertheless, she was able to persist with encouragement and did make it through an abbreviated test battery. The profile shows memory storage type problems, naming problems, and low scores in several other areas including on measures of executive functioning and processing speed. She retrieved many of the naming items she missed with cues, suggesting this is a word-retrieval as opposed to ESL or vocabulary issue. Visuospatial and constructional functioning remains well preserved as was her performance on measures of digit repetition. There were relevant differences in instrumentation although the overall impression is that of some decline since the previous evaluation, particularly on measures of processing speed and executive  abilities. Her MoCA is now a 14, which is likely an underestimate to some extent but is still significantly lower than her previous all time high of 24. She screened negative for the presence of depression. Her CDR sum of boxes is 4.5 and her global score is 1, which is mild dementia.   Nancy [name] has thus reached a mild dementia level of function, I concur with Dr. Melvyn Novas and Dr. Delice Lesch that her presentation is consistent with Alzheimer's clinical syndrome, with her imaging, demographics, clinical history, and neuropsychological testing all arguing in favor of that impression. I defer to Dr. Delice Lesch regarding the patient's driving but do have at least some concern given her report of getting lost for an extended period of time.   Diagnostic Impressions: Alzheimer's dementia, late onset, with behavioral disturbance   Recommendations to be discussed with patient  Your performance and presentation on assessment were consistent with less than expected performance on measures of memory, processing speed, executive functioning, naming, and in some other areas. While these findings must be interpreted cautiously because of your non-native English speaking status and the fact that you were somewhat upset during the testing, they are still concerning for fairly significant cognitive difficulties. My impression is that there has been some objective decline since your last testing although there were relevant differences in instrumentation. In conjunction with your clinical history, I think that mild dementia is the best diagnosis.   Dementia refers to a group of syndromes where multiple areas of ability are damaged in the brain, such as memory, thinking, judgment, and behavior, and most commonly refers to age related causes of dementia that cause worsening in these abilities over time. Alzheimer's disease is the most common form of dementia in people over the age of 66. Not all  dementias are Alzheimer's disease, but all  Alzheimer's disease is dementia. When dementia is due to an underlying condition affecting the brain, such as Alzheimer's disease, there is progression over time, which typically procedes gradually over many years.   In your case, I think that your dementia is due to the most common cause of age-related cognitive decline, namely Alzheimer's disease. Your imaging shows some shrinkage in parts of the brain that are characteristic of what we expect to see in Alzheimer's and you also had supportive test data. This means that there will be some progression over time but positively, it is at a mild level of progression, so now is the time to engage in preventative and compensatory strategies. This includes things like eating a brain healthy diet, getting exercise, assertively managing risk factors like hypertension, and the like.   You are already on a good front-line antidementia medication and I suggest that you continue under Dr. Amparo Bristol direction.   Your daughter reported that she is looking into alternative living circumstances for you. Appropriate settings for individuals with mild dementia include home with assistance as needed and assisted living. I would recommend that your family monitor your medication compliance as they are already doing to make sure you are taking your medications as directed. I would also recommend that you continue to get assistance with financial and other decisions of high risk. You should bring a family member with you to your medical appointments, because memory problems can interfere with an individuals ability to provide history among other things.   I defer to Dr. Delice Lesch who is more familiar with your functioning regarding driving, but it is concerning that you have gotten lost for extended periods of time and having difficulty navigating even with a GPS. At a minimum I would recommend that your condition is closely monitored, that you drive only in familiar areas, that you  do not drive at night, and that you do not operate unfamiliar vehicles such as rental cars or friends vehicles.   There is now good quality evidence from at least one large scale study that a modified mediterranean diet may help slow cognitive decline. This is known as the "MIND" diet. The Mind diet is not so much a specific diet as it is a set of recommendations for things that you should and should not eat.   Foods that are ENCOURAGED on the MIND Diet:  Green, leafy vegetables: Aim for six or more servings per week. This includes kale, spinach, cooked greens and salads.  All other vegetables: Try to eat another vegetable in addition to the green leafy vegetables at least once a day. It is best to choose non-starchy vegetables because they have a lot of nutrients with a low number of calories.  Berries: Eat berries at least twice a week. There is a plethora of research on strawberries, and other berries such as blueberries, raspberries and blackberries have also been found to have antioxidant and brain health benefits.  Nuts: Try to get five servings of nuts or more each week. The creators of the [place] don't specify what kind of nuts to consume, but it is probably best to vary the type of nuts you eat to obtain a variety of nutrients. Peanuts are a legume and do not fall into this category.  Olive oil: Use olive oil as your main cooking oil. There may be other heart-healthy alternatives such as algae oil, though there is not yet sufficient research upon which to base a  formal recommendation.  Whole grains: Aim for at least three servings daily. Choose minimally processed grains like oatmeal, quinoa, brown rice, whole-wheat pasta and 100% whole-wheat bread.  Fish: Eat fish at least once a week. It is best to choose fatty fish like salmon, sardines, trout, tuna and mackerel for their high amounts of omega-3 fatty acids.  Beans: Include beans in at least four meals every week. This includes all beans,  lentils and soybeans.  Poultry: Try to eat chicken or [place] at least twice a week. Note that fried chicken is not encouraged on the MIND diet.  Wine: Aim for no more than one glass of alcohol daily. Both red and white wine may benefit the brain. However, much research has focused on the red wine compound resveratrol, which may help protect against Alzheimer's disease.  Foods that are DISCOURAGED on the MIND Diet: Butter and margarine: Try to eat less than 1 tablespoon (about 14 grams) daily. Instead, try using olive oil as your primary cooking fat, and dipping your bread in olive oil with herbs.  Cheese: The MIND diet recommends limiting your cheese consumption to less than once per week.  Red meat: Aim for no more than three servings each week. This includes all beef, pork, lamb and products made from these meats.  [place] food: The MIND diet highly discourages fried food, especially the kind from fast-food restaurants. Limit your consumption to less than once per week.  Pastries and sweets: This includes most of the processed junk food and desserts you can think of. Ice cream, cookies, brownies, snack cakes, donuts, candy and more. Try to limit these to no more than four times a week.  Exercise is one of the best medicines for promoting health and maintaining cognitive fitness at all stages in life. Exercise probably has the largest documented effect on brain health and performance of any lifestyle intervention. Studies have shown that even previously sedentary individuals who start exercising as late as age 70 show a significant survival benefit as compared to their non-exercising peers. In the [place], the current guidelines are for 30 minutes of moderate exercise per day, but increasing your activity level less than that may also be helpful. You do not have to get your 30 minutes of exercise in one shot and exercising for short periods of time spread throughout the day can be helpful. Go for  several walks, learn to dance, or do something else you enjoy that gets your body moving. Of course, if you have an underlying medical condition or there is any question about whether it is safe for you to exercise, you should consult a medical treatment provider prior to beginning exercise.   Staying active mentally is crucially important. This can include formal types of brain training, such as cognitive rehabilitation, sudoku, crossword puzzles and the like, although the best thing is to stay active and engaged in your life. This includes having meaningful hobbies and interests that you pursue and especially cultivating satisfying social relationships, which are a form of cognitive stimulation and have been shown to be protective in and of themselves.    Test Findings  Test scores are summarized in additional documentation associated with this encounter. Test scores are relative to age, gender, and educational history as available and appropriate. Test findings must be interpreted cautiously in this non-native [place] speaker given that these tests were designed for and normed on native [place] speaking samples. Ms. Sheila Oats also was somewhat anxious and upset during the  testing, rendering findings a conservative estimate of actual abilities.   General Intellectual Functioning/Achievement:  Performance was low average on single word reading.   Attention and Processing Efficiency: Indicators of attention and working memory generated reasonable scores with low average digit repetition forward and backward. There are no appreciable changes relative to her previous test performance.   Ms. Stambaugh performed at an extremely low level on timed number-symbol coding from the RBANS, which is lower than her score at the previous assessment. Simple numeric sequencing was low average but that is a very easy test.   Language: Performance on language measures was low, including on visual object  confrontation naming. While naming tests are notoriously unreliable in bilingual individuals, many of the items Ms. Etheredge missed she was able to retrieve with semantic or phonemic cues. This suggests that bona fide word retrieval issues (as opposed to vocabulary or ESL issues) are responsible for her low score. Generation of words in response to letters and the category prompt animals was extremely low, perhaps a bit lower than at the previous assessment.   Visuospatial Function: Performance was average on measures of visuospatial and constructional functioning with a low average score on figure copy and an average to high average score on judgment of angular line orientations.   Learning and Memory: Performance on measures of memory and learning showed weak encoding of information and virtually no retention of information across time. The pattern is concerning for a storage problem.   In the verbal realm, she learned 2, 4, and 3 words of a 12-item word list across three learning trials, which is extremely low. She did not recall any of the words on short delayed recall and then did get one on long delayed recall but that is still unusually low. Recognition cuing did not help as she had more false-positive errors than correct identifications, which is a pattern sometimes seen in Alzheimer's type memory problems.   Delayed free recall for visual information in the form of a modestly complex geometric figure was extremely low.   Executive Functions: Performance was low on most measures within this domain, with the exception of reasoning with verbal information, which was low average. Alternating sequencing of numbers and letters of the alphabet had to be discontinued due to trouble with the task, a demonstration of impairment. Clock drawing was consistent with "mild impairment" with no clock hands. Generation of words in response to given letters was extremely low.   Rating Scale(s): Ms. Busk screened  negative for the presence of depression although as mentioned previously, she may have a tendency to downplay affective issues. Her daughter rated her as functioning at a mild dementia level of functioning on severity status rating. I was able to rate a CDR for her and her global score is 1, her Sum of Boxes is 4.5, which is mild dementia.   Viviano Simas Nicole Kindred PsyD, [place] Clinical Neuropsychologist

## 2020-07-02 ENCOUNTER — Other Ambulatory Visit: Payer: Self-pay

## 2020-07-02 ENCOUNTER — Ambulatory Visit (INDEPENDENT_AMBULATORY_CARE_PROVIDER_SITE_OTHER): Payer: Medicare Other | Admitting: Counselor

## 2020-07-02 ENCOUNTER — Encounter: Payer: Self-pay | Admitting: Counselor

## 2020-07-02 DIAGNOSIS — F0281 Dementia in other diseases classified elsewhere with behavioral disturbance: Secondary | ICD-10-CM | POA: Diagnosis not present

## 2020-07-02 DIAGNOSIS — F02818 Dementia in other diseases classified elsewhere, unspecified severity, with other behavioral disturbance: Secondary | ICD-10-CM | POA: Insufficient documentation

## 2020-07-02 DIAGNOSIS — G301 Alzheimer's disease with late onset: Secondary | ICD-10-CM

## 2020-07-02 NOTE — Progress Notes (Signed)
[facility]  Telemedicine statement:  I discussed the limitations of neuropsychological care via telemedicine and the availability of in person appointments. The patient expressed understanding and agreed to proceed. The patient was verified with two identifiers.  The visit modality was: telephonic The patient location was: home The provider location was: office  The following individuals participated: Laquida Cotrell  Feedback Note: I met with Nancy [name] to review the findings resulting from her neuropsychological evaluation. Since the last appointment, she has been about the same. She is packing and moving her house and clearly has significant stress about it. Time was spent reviewing the impressions and recommendations that are detailed in the evaluation report. We discussed impression of mild stage Alzheimer's dementia, based on neuropsychological test findings, history, demographics, and imaging. We discussed behavior as a form of communication as pertaining to Nancy [name], who continues to have significant sleeping problems related to anxiety. I think packing is overwhelming for her at this point and suggested that her daughter look into getting more help, which they will work on. We discussed the fact that most individuals with mild dementia are fully capable of living independently, with some support with iADLs, which can include home with significant assistance or assisted living. I was also candid with them that she is at risk for decline, because that is germane to their ultimate decision about the appropriate living circumstances. I took time to explain the findings and answer all the patient's questions. I encouraged Nancy [name] to contact me should she have any further questions or if further follow up is desired.   Current Medications and Medical History   Current Outpatient Medications  Medication Sig Dispense Refill    ALPRAZolam (XANAX) 0.25 MG tablet Take 1 tablet (0.25 mg total) by mouth at bedtime as needed for sleep (use cautiously). 20 tablet 0   aspirin 81 MG tablet Take 81 mg by mouth daily.      cyanocobalamin 1000 MCG tablet Take 1,000 mcg by mouth daily.     donepezil (ARICEPT) 10 MG tablet Take 1 tablet daily 90 tablet 3   escitalopram (LEXAPRO) 20 MG tablet Take 1 tablet by mouth once daily 30 tablet 0   ezetimibe (ZETIA) 10 MG tablet Take 1 tablet (10 mg total) by mouth daily. 30 tablet 10   metoprolol succinate (TOPROL XL) 25 MG 24 hr tablet Take 1 tablet (25 mg total) by mouth daily. 90 tablet 3   nitroGLYCERIN (NITROSTAT) 0.4 MG SL tablet Place 1 tablet (0.4 mg total) under the tongue every 5 (five) minutes as needed for chest pain. 25 tablet 6   traZODone (DESYREL) 100 MG tablet Take 1 tablet (100 mg total) by mouth at bedtime as needed for sleep. 30 tablet 2   No current facility-administered medications for this visit.   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 Speech was normal in rate, rhythm, volume, and prosody. Self-reported mood was "good" and affect as assessed by vocal quality was mildly anxious. Thought process was logical and goal  oriented and thought content was appropriate to the topics discussed. There were no safety concerns identified at today's encounter, such as thoughts of harming self or others.   Plan  Feedback provided regarding the patient's neuropsychological  evaluation. She has a follow up scheduled with Dr. Delice Lesch in October. They will work on getting more assistance to help with anxiety. Nancy [name] was encouraged to contact me if any questions arise or if further follow up is desired.   Viviano Simas Nicole Kindred, PsyD, ABN Clinical Neuropsychologist  Service(s) Provided at This Encounter: 41 minutes [phone]; Conjoint therapy with patient present)

## 2020-07-22 ENCOUNTER — Other Ambulatory Visit: Payer: Self-pay | Admitting: Neurology

## 2020-07-22 ENCOUNTER — Other Ambulatory Visit: Payer: Self-pay | Admitting: Internal Medicine

## 2020-07-30 ENCOUNTER — Ambulatory Visit (INDEPENDENT_AMBULATORY_CARE_PROVIDER_SITE_OTHER): Payer: Medicare Other | Admitting: Family Medicine

## 2020-07-30 ENCOUNTER — Other Ambulatory Visit: Payer: Self-pay

## 2020-07-30 VITALS — BP 130/68 | HR 66 | Temp 98.6°F | Ht 65.0 in | Wt 120.0 lb

## 2020-07-30 DIAGNOSIS — I2510 Atherosclerotic heart disease of native coronary artery without angina pectoris: Secondary | ICD-10-CM

## 2020-07-30 DIAGNOSIS — M67449 Ganglion, unspecified hand: Secondary | ICD-10-CM | POA: Diagnosis not present

## 2020-07-30 NOTE — Progress Notes (Signed)
Subjective:    Patient ID: Nancy [name], female    DOB: 12/12/1945, 74 y.o.   MRN: [id number]    Please see the photograph above.  Patient states that this summer she developed a painful clear cystlike bump on the distal end of her right third digit.  That has since degenerated into a scaly red papule as shown above.  The fingernail is just formed distal to this.  It is also painful.  She has a similar lesion now starting to form on the distal left third digit.  This is a 3 mm clear cystlike mass. Past Medical History:  Diagnosis Date  . Coronary atherosclerosis of native coronary artery   . Dyslipidemia   . Essential hypertension, benign   . History of colonic polyps   . Osteoporosis   . 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 CORONARY ANGIOPLASTY WITH STENT PLACEMENT  2010   3 times  . CORONARY ARTERY BYPASS GRAFT  2009  . CRANIOTOMY  04/27/2012   Procedure: CRANIOTOMY HEMATOMA EVACUATION SUBDURAL;  Surgeon: Eustace Moore, MD;  Location: [facility];  Service: Neurosurgery;  Laterality: Right;  Right Frontal Burr hole for Subdural Hematoma  . NASAL SINUS SURGERY    . POLYPECTOMY     Current Outpatient Medications on File Prior to Visit  Medication Sig Dispense Refill  . ALPRAZolam (XANAX) 0.25 MG tablet Take 1 tablet (0.25 mg total) by mouth at bedtime as needed for sleep (use cautiously). 20 tablet 0  . aspirin 81 MG tablet Take 81 mg by mouth daily.     . cyanocobalamin 1000 MCG tablet Take 1,000 mcg by mouth daily.    Marland Kitchen donepezil (ARICEPT) 10 MG tablet Take 1 tablet daily 90 tablet 3  . escitalopram (LEXAPRO) 20 MG tablet Take 1 tablet by mouth once daily 30 tablet 0  . ezetimibe (ZETIA) 10 MG tablet Take 1 tablet by mouth once daily 30 tablet 3  . metoprolol succinate (TOPROL XL) 25 MG 24 hr tablet Take 1 tablet (25 mg total) by mouth daily. 90 tablet 3  . nitroGLYCERIN (NITROSTAT) 0.4 MG SL tablet Place 1 tablet (0.4 mg  total) under the tongue every 5 (five) minutes as needed for chest pain. 25 tablet 6  . traZODone (DESYREL) 100 MG tablet Take 1 tablet (100 mg total) by mouth at bedtime as needed for sleep. 30 tablet 2   No current facility-administered medications on file prior to visit.    Allergies  Allergen Reactions  . Alendronate Shortness Of Breath and Swelling  . Atorvastatin Other (See Comments)    Muscle cramps  . Bactrim [Sulfamethoxazole-Trimethoprim] Other (See Comments)    Blisters/skin peeling, nausea  . Buprenorphine Hcl Nausea Only  . Morphine Nausea Only    OTHER  . Morphine And Related Nausea Only  . Prednisone Other (See Comments)    Unknown  . Sulfa Antibiotics Itching    Blisters/skin peeling  . Sulfasalazine Itching    Blisters/skin peeling  . Sulfonamide Derivatives Other (See Comments)    Blisters/skin peeling   Social History   Socioeconomic History  . Marital status: Widowed    Spouse name: Not on file  . Number of children: Not on file  . Years of education: Not on file  . Highest education level: Not on file  Occupational History  . Not on file  Tobacco Use  . Smoking status: Never Smoker  . Smokeless tobacco: Never  Used  Vaping Use  . Vaping Use: Never used  Substance and Sexual Activity  . Alcohol use: Yes    Comment: occasional glass wine  . Drug use: No  . Sexual activity: Not Currently    Partners: Male    Comment: 1ST INTERCOURSE- 73, PARTNERS- 1 , MARRIED - 89 YRS   Other Topics Concern  . Not on file  Social History Narrative   Pt lives in 2 story home with her husband, Jonni Sanger   Has 2 adult daughters, 2 granddaughters   12th grade education   Retired Charity fundraiser    Social Determinants of Radio broadcast assistant Strain:   . Difficulty of Paying Living Expenses: Not on file  Food Insecurity:   . Worried About Charity fundraiser in the Last Year: Not on file  . Ran Out of Food in the Last Year: Not on file  Transportation Needs:    . Lack of Transportation (Medical): Not on file  . Lack of Transportation (Non-Medical): Not on file  Physical Activity:   . Days of Exercise per Week: Not on file  . Minutes of Exercise per Session: Not on file  Stress:   . Feeling of Stress : Not on file  Social Connections:   . Frequency of Communication with Friends and Family: Not on file  . Frequency of Social Gatherings with Friends and Family: Not on file  . Attends Religious Services: Not on file  . Active Member of Clubs or Organizations: Not on file  . Attends Archivist Meetings: Not on file  . Marital Status: Not on file  Intimate Partner Violence:   . Fear of Current or Ex-Partner: Not on file  . Emotionally Abused: Not on file  . Physically Abused: Not on file  . Sexually Abused: Not on file     Review of Systems  All other systems reviewed and are negative.      Objective:   Physical Exam Vitals reviewed.  Constitutional:      General: She is not in acute distress.    Appearance: Normal appearance. She is normal weight.  Cardiovascular:     Rate and Rhythm: Normal rate and regular rhythm.     Heart sounds: Normal heart sounds.  Pulmonary:     Effort: Pulmonary effort is normal.     Breath sounds: Normal breath sounds.  Musculoskeletal:     Right hand: Deformity and tenderness present.     Left hand: Deformity present. No tenderness.       Hands:  Neurological:     Mental Status: She is alert and oriented to person, place, and time. Mental status is at baseline.  Psychiatric:        Mood and Affect: Mood normal.        Behavior: Behavior normal.        Thought Content: Thought content normal.        Judgment: Judgment normal.           Assessment & Plan:  Digital mucinous cyst of finger  I believe the patient likely had a digital mucous cyst develop on the right third digit and she is also developing 1 on the left third digit.  I believe the 1 on the right third digit has  ruptured and degenerated and is now affecting the underlying matrix causing the malformed fingernail.  It is also painful.  Less likely would be some type of skin malignancy on the tip of the  right finger.  I will refer the patient to a hand surgeon to remove the digital mucous cyst and to biopsy the lesion to rule out an underlying skin cancer

## 2020-08-13 ENCOUNTER — Ambulatory Visit: Payer: Medicare Other | Admitting: Neurology

## 2020-09-05 ENCOUNTER — Other Ambulatory Visit: Payer: Self-pay

## 2020-09-05 ENCOUNTER — Ambulatory Visit (INDEPENDENT_AMBULATORY_CARE_PROVIDER_SITE_OTHER): Payer: Medicare Other | Admitting: Family Medicine

## 2020-09-05 VITALS — BP 140/60 | HR 68 | Temp 97.7°F | Ht 65.0 in | Wt 123.0 lb

## 2020-09-05 DIAGNOSIS — G3184 Mild cognitive impairment, so stated: Secondary | ICD-10-CM

## 2020-09-05 DIAGNOSIS — I2510 Atherosclerotic heart disease of native coronary artery without angina pectoris: Secondary | ICD-10-CM

## 2020-09-05 DIAGNOSIS — M67449 Ganglion, unspecified hand: Secondary | ICD-10-CM

## 2020-09-05 MED ORDER — CEPHALEXIN 500 MG PO CAPS: 500.0000 mg | ORAL_CAPSULE | Freq: Three times a day (TID) | ORAL | 0 refills | Status: DC

## 2020-09-05 NOTE — Progress Notes (Signed)
Subjective:    Patient ID: Nancy [name], female    DOB: July 23, 1946, 74 y.o.   MRN: [id number] 07/30/20   Please see the photograph above.  Patient states that this summer she developed a painful clear cystlike bump on the distal end of her right third digit.  That has since degenerated into a scaly red papule as shown above.  The fingernail is just formed distal to this.  It is also painful.  She has a similar lesion now starting to form on the distal left third digit.  This is a 3 mm clear cystlike mass.  At that time, my plan was: I believe the patient likely had a digital mucous cyst develop on the right third digit and she is also developing 1 on the left third digit.  I believe the 1 on the right third digit has ruptured and degenerated and is now affecting the underlying matrix causing the malformed fingernail.  It is also painful.  Less likely would be some type of skin malignancy on the tip of the right finger.  I will refer the patient to a hand surgeon to remove the digital mucous cyst and to biopsy the lesion to rule out an underlying skin cancer  09/05/20 Patient states she has not heard anything from the orthopedic office.  However she seems extremely confused today and has no recollection of [place] discussing this before.  She also admits that she is not answering the telephone if she does not recognize the phone number.  Therefore is possible they called her and she did not answer the phone.  She admits that she has been picking at the lesion on her right third digit and it is now extremely painful and erythematous.  Please see the picture below:    Past Medical History:  Diagnosis Date  . Coronary atherosclerosis of native coronary artery   . Dyslipidemia   . Essential hypertension, benign   . History of colonic polyps   . Osteoporosis   . 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 CORONARY ANGIOPLASTY WITH STENT PLACEMENT  2010    3 times  . CORONARY ARTERY BYPASS GRAFT  2009  . CRANIOTOMY  04/27/2012   Procedure: CRANIOTOMY HEMATOMA EVACUATION SUBDURAL;  Surgeon: Eustace Moore, MD;  Location: [facility];  Service: Neurosurgery;  Laterality: Right;  Right Frontal Burr hole for Subdural Hematoma  . NASAL SINUS SURGERY    . POLYPECTOMY     Current Outpatient Medications on File Prior to Visit  Medication Sig Dispense Refill  . ALPRAZolam (XANAX) 0.25 MG tablet Take 1 tablet (0.25 mg total) by mouth at bedtime as needed for sleep (use cautiously). 20 tablet 0  . aspirin 81 MG tablet Take 81 mg by mouth daily.     . cyanocobalamin 1000 MCG tablet Take 1,000 mcg by mouth daily.    Marland Kitchen donepezil (ARICEPT) 10 MG tablet Take 1 tablet daily 90 tablet 3  . escitalopram (LEXAPRO) 20 MG tablet Take 1 tablet by mouth once daily 30 tablet 0  . ezetimibe (ZETIA) 10 MG tablet Take 1 tablet by mouth once daily 30 tablet 3  . metoprolol succinate (TOPROL XL) 25 MG 24 hr tablet Take 1 tablet (25 mg total) by mouth daily. 90 tablet 3  . nitroGLYCERIN (NITROSTAT) 0.4 MG SL tablet Place 1 tablet (0.4 mg total) under the tongue every 5 (five) minutes as needed for chest pain. 25 tablet 6  .  traZODone (DESYREL) 100 MG tablet Take 1 tablet (100 mg total) by mouth at bedtime as needed for sleep. 30 tablet 2   No current facility-administered medications on file prior to visit.    Allergies  Allergen Reactions  . Alendronate Shortness Of Breath and Swelling  . Atorvastatin Other (See Comments)    Muscle cramps  . Bactrim [Sulfamethoxazole-Trimethoprim] Other (See Comments)    Blisters/skin peeling, nausea  . Buprenorphine Hcl Nausea Only  . Morphine Nausea Only    OTHER  . Morphine And Related Nausea Only  . Prednisone Other (See Comments)    Unknown  . Sulfa Antibiotics Itching    Blisters/skin peeling  . Sulfasalazine Itching    Blisters/skin peeling  . Sulfonamide Derivatives Other (See Comments)    Blisters/skin peeling    Social History   Socioeconomic History  . Marital status: Widowed    Spouse name: Not on file  . Number of children: Not on file  . Years of education: Not on file  . Highest education level: Not on file  Occupational History  . Not on file  Tobacco Use  . Smoking status: Never Smoker  . Smokeless tobacco: Never Used  Vaping Use  . Vaping Use: Never used  Substance and Sexual Activity  . Alcohol use: Yes    Comment: occasional glass wine  . Drug use: No  . Sexual activity: Not Currently    Partners: Male    Comment: 1ST INTERCOURSE- 85, PARTNERS- 1 , MARRIED - 38 YRS   Other Topics Concern  . Not on file  Social History Narrative   Pt lives in 2 story home with her husband, Jonni Sanger   Has 2 adult daughters, 2 granddaughters   12th grade education   Retired Charity fundraiser    Social Determinants of Radio broadcast assistant Strain:   . Difficulty of Paying Living Expenses: Not on file  Food Insecurity:   . Worried About Charity fundraiser in the Last Year: Not on file  . Ran Out of Food in the Last Year: Not on file  Transportation Needs:   . Lack of Transportation (Medical): Not on file  . Lack of Transportation (Non-Medical): Not on file  Physical Activity:   . Days of Exercise per Week: Not on file  . Minutes of Exercise per Session: Not on file  Stress:   . Feeling of Stress : Not on file  Social Connections:   . Frequency of Communication with Friends and Family: Not on file  . Frequency of Social Gatherings with Friends and Family: Not on file  . Attends Religious Services: Not on file  . Active Member of Clubs or Organizations: Not on file  . Attends Archivist Meetings: Not on file  . Marital Status: Not on file  Intimate Partner Violence:   . Fear of Current or Ex-Partner: Not on file  . Emotionally Abused: Not on file  . Physically Abused: Not on file  . Sexually Abused: Not on file     Review of Systems  All other systems reviewed and  are negative.      Objective:   Physical Exam Vitals reviewed.  Constitutional:      General: She is not in acute distress.    Appearance: Normal appearance. She is normal weight.  Cardiovascular:     Rate and Rhythm: Normal rate and regular rhythm.     Heart sounds: Normal heart sounds.  Pulmonary:     Effort: Pulmonary  effort is normal.     Breath sounds: Normal breath sounds.  Musculoskeletal:     Right hand: Deformity and tenderness present.     Left hand: Deformity present. No tenderness.       Hands:  Neurological:     Mental Status: She is alert and oriented to person, place, and time. Mental status is at baseline.  Psychiatric:        Mood and Affect: Mood normal.        Behavior: Behavior normal.        Thought Content: Thought content normal.        Judgment: Judgment normal.    Right third digit is now warm erythematous and swollen distal to the DIP joint.  Picture does not demonstrate the erythema adequately.  I believe she is developing a secondary cellulitis       Assessment & Plan:  Digital mucinous cyst of finger - Plan: Ambulatory referral to Hand Surgery  Mild neurocognitive disorder Reconsult hand surgeon.  Recommended the patient start Keflex 500 mg p.o. 3 times daily for 1 week for secondary cellulitis and to stop trying to "drain" the cyst on her finger.  I will reach out to her family to discuss my concerns about her worsening dementia she seems more confused today unfortunately

## 2020-09-18 ENCOUNTER — Ambulatory Visit: Payer: Self-pay

## 2020-09-18 ENCOUNTER — Ambulatory Visit (INDEPENDENT_AMBULATORY_CARE_PROVIDER_SITE_OTHER): Payer: Medicare Other | Admitting: Orthopaedic Surgery

## 2020-09-18 ENCOUNTER — Encounter: Payer: Self-pay | Admitting: Orthopaedic Surgery

## 2020-09-18 ENCOUNTER — Ambulatory Visit (INDEPENDENT_AMBULATORY_CARE_PROVIDER_SITE_OTHER): Payer: Medicare Other

## 2020-09-18 DIAGNOSIS — M79644 Pain in right finger(s): Secondary | ICD-10-CM | POA: Insufficient documentation

## 2020-09-18 DIAGNOSIS — M79645 Pain in left finger(s): Secondary | ICD-10-CM | POA: Insufficient documentation

## 2020-09-18 DIAGNOSIS — I2510 Atherosclerotic heart disease of native coronary artery without angina pectoris: Secondary | ICD-10-CM | POA: Diagnosis not present

## 2020-09-18 NOTE — Progress Notes (Signed)
Office Visit Note   Patient: Nancy [name]           Date of Birth: Apr 20, 1946           MRN: [id number] Visit Date: 09/18/2020              Requested by: Susy Frizzle, MD [address] [facility] [place],  [place] [address] PCP: Susy Frizzle, MD   Assessment & Plan: Visit Diagnoses:  1. Pain of right middle finger   2. Pain of left middle finger     Plan: Impression is bilateral middle finger DIP osteoarthritis.  She has a large dorsal osteophyte on the right DIP joint.  We talked about our options of excision of the osteophyte which would likely resolve the cyst which may or may not correct the nail changes.  Currently does not appear to have any infection.  After careful consideration she will just monitor this for a while and see if this will recur.  For the left middle finger seen processes going on but just milder and she is not have a cyst at this time.  We will just monitor this for now as well.  Follow-Up Instructions: Return if symptoms worsen or fail to improve.   Orders:  Orders Placed This Encounter  Procedures  . XR Finger Middle Right  . XR Finger Middle Left   No orders of the defined types were placed in this encounter.     Procedures: No procedures performed   Clinical Data: No additional findings.   Subjective: Chief Complaint  Patient presents with  . Right Hand - Pain  . Left Hand - Pain    Patient is a very pleasant 74 year old female with some early Alzheimer's who comes in for evaluation of bilateral middle finger DIP cyst.  She reports some chronic pain and stiffness in all of her fingers.  She has had cyst over her third DIP joint which has drained sporadically.  She has had been placed on antibiotics in the past for cellulitis per PCP documentation.  She comes in for evaluation for this.  She also has a developing cyst on the left middle finger DIP joint.   Review of Systems  Constitutional: Negative.   HENT: Negative.   Eyes:  Negative.   Respiratory: Negative.   Cardiovascular: Negative.   Endocrine: Negative.   Musculoskeletal: Negative.   Neurological: Negative.   Hematological: Negative.   Psychiatric/Behavioral: Negative.   All other systems reviewed and are negative.    Objective: Vital Signs: There were no vitals taken for this visit.  Physical Exam Vitals and nursing note reviewed.  Constitutional:      Appearance: She is well-developed.  HENT:     Head: Normocephalic and atraumatic.  Pulmonary:     Effort: Pulmonary effort is normal.  Abdominal:     Palpations: Abdomen is soft.  Musculoskeletal:     Cervical back: Neck supple.  Skin:    General: Skin is warm.     Capillary Refill: Capillary refill takes less than 2 seconds.  Neurological:     Mental Status: She is alert and oriented to person, place, and time.  Psychiatric:        Behavior: Behavior normal.        Thought Content: Thought content normal.        Judgment: Judgment normal.     Ortho Exam Right middle finger shows a resolved cyst at the cuticle on the ulnar side causing  nail changes.  The nail is intact and there is no neurovascular abnormalities.  She has some decreased arc of motion of the finger due to underlying arthritis and stiffness.  Left middle finger shows a developing digital mucous cyst on the radial side just distal to the DIP joint.  There are no nail changes.  No neurovascular compromise.  Arc of motion is normal.  Specialty Comments:  No specialty comments available.  Imaging: XR Finger Middle Left  Result Date: 09/18/2020 Moderate osteoarthritis of the DIP joint.  XR Finger Middle Right  Result Date: 09/18/2020 Advanced osteoarthritis of the DIP joint with a large dorsal osteophyte.    PMFS History: Patient Active Problem List   Diagnosis Date Noted  . Pain of right middle finger 09/18/2020  . Pain of left middle finger 09/18/2020  . Late onset Alzheimer's disease with behavioral  disturbance ([place]) 07/02/2020  . Mild neurocognitive disorder 07/19/2019  . Osteoporosis   . Chest pain, unspecified 08/27/2009  . Hyperlipidemia 08/01/2009  . Essential hypertension, benign 08/01/2009  . Coronary artery disease involving native coronary artery of native heart without angina pectoris 08/01/2009   Past Medical History:  Diagnosis Date  . Coronary atherosclerosis of native coronary artery   . Dyslipidemia   . Essential hypertension, benign   . History of colonic polyps   . Osteoporosis   . 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 Hypertension Mother   . Cancer Mother        Colon  . Kidney disease Mother   . Memory loss Mother   . Heart attack Father   . Hypertension Brother   . Stroke Maternal Grandmother   . Memory loss Maternal Grandmother   . Cancer Maternal Grandfather        Intestinal    Past Surgical History:  Procedure Laterality Date  . CORONARY ANGIOPLASTY WITH STENT PLACEMENT  2010   3 times  . CORONARY ARTERY BYPASS GRAFT  2009  . CRANIOTOMY  04/27/2012   Procedure: CRANIOTOMY HEMATOMA EVACUATION SUBDURAL;  Surgeon: Eustace Moore, MD;  Location: [facility];  Service: Neurosurgery;  Laterality: Right;  Right Frontal Burr hole for Subdural Hematoma  . NASAL SINUS SURGERY    . POLYPECTOMY     Social History   Occupational History  . Not on file  Tobacco Use  . Smoking status: Never Smoker  . Smokeless tobacco: Never Used  Vaping Use  . Vaping Use: Never used  Substance and Sexual Activity  . Alcohol use: Yes    Comment: occasional glass wine  . Drug use: No  . Sexual activity: Not Currently    Partners: Male    Comment: 1ST INTERCOURSE- 52, PARTNERS- 1 , MARRIED - 51 YRS

## 2020-09-20 ENCOUNTER — Other Ambulatory Visit: Payer: Self-pay | Admitting: Neurology

## 2020-09-23 DIAGNOSIS — Z23 Encounter for immunization: Secondary | ICD-10-CM | POA: Diagnosis not present

## 2020-10-04 IMAGING — CR DG ABDOMEN 2V
2 series · 2 of 2 positions shown · non-contrast
Comparison: Abdomen/pelvis CT 03/31/2018

CLINICAL DATA: Abdominal pain.  Evaluate for constipation.

EXAM:
ABDOMEN - 2 VIEW

[w abdomen upright *]
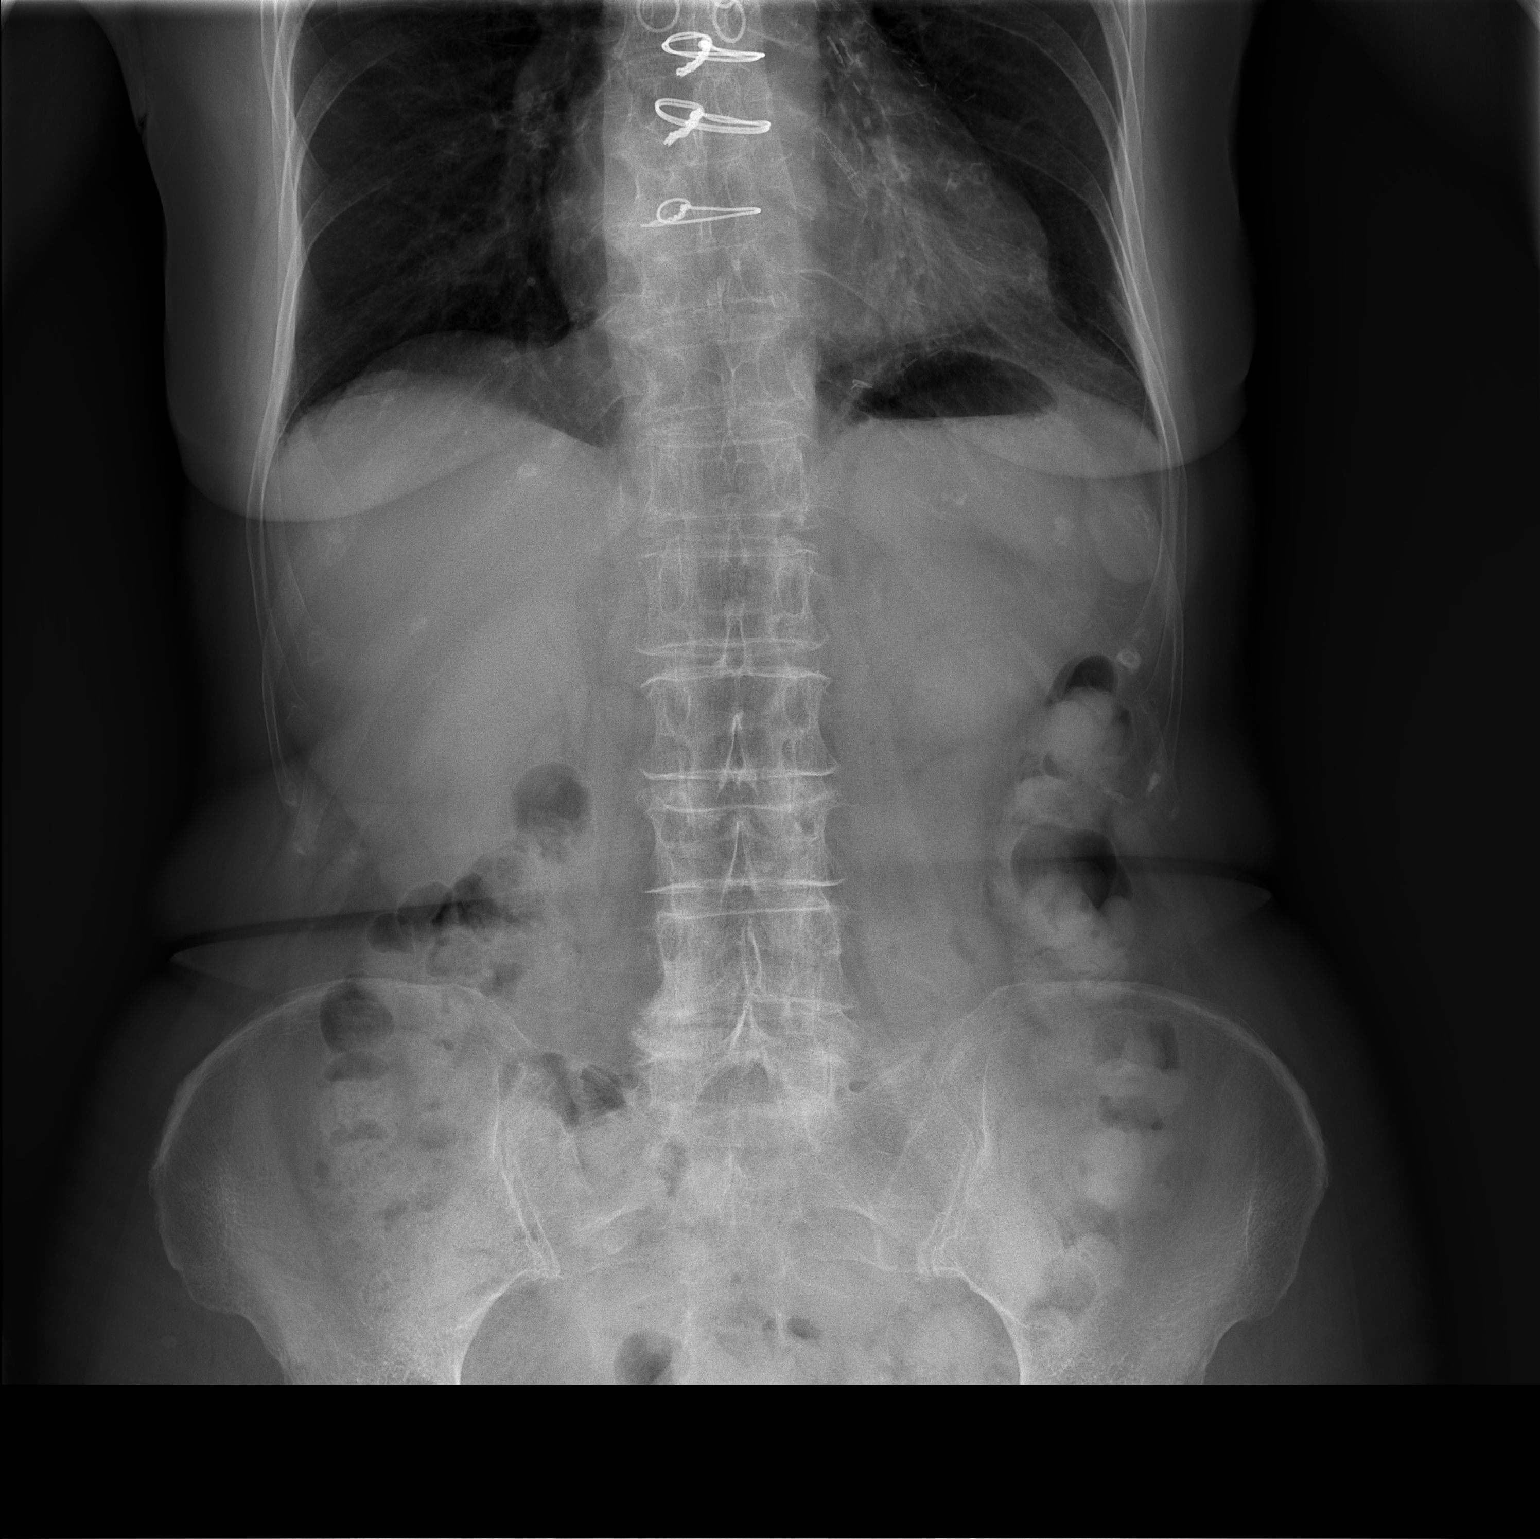

[t abdomen supine]
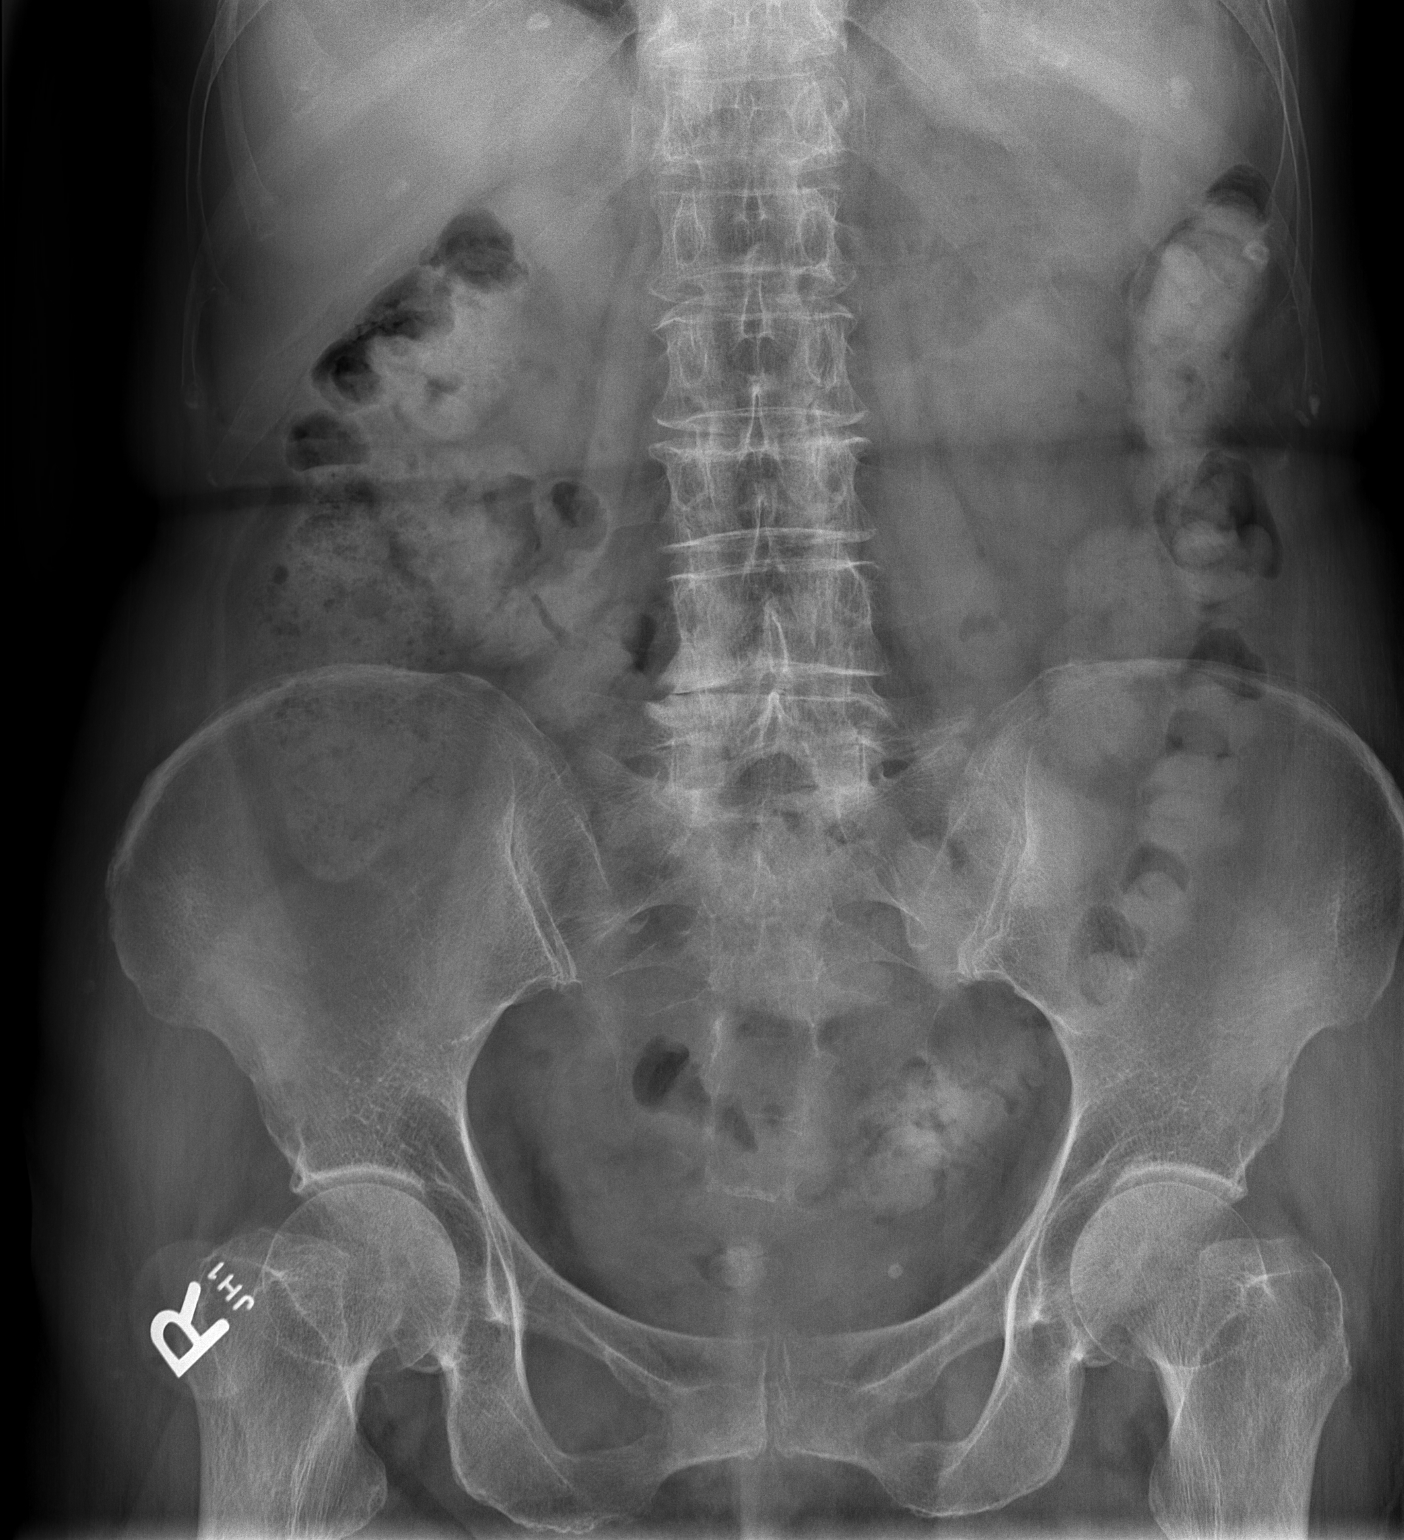

[2 of 2 positions shown; findings below may reference images not displayed]

FINDINGS: Upright film shows no evidence for intraperitoneal free air. There
is no evidence for gaseous bowel dilation to suggest obstruction.
Moderate stool volume is distributed along the length of the colon,
from the cecum to the distal sigmoid segment. Degenerative changes
noted lumbar spine.
IMPRESSION: Moderate stool volume. Imaging features could be compatible with
clinical constipation.

## 2020-10-24 ENCOUNTER — Other Ambulatory Visit: Payer: Self-pay | Admitting: Cardiovascular Disease

## 2020-11-20 ENCOUNTER — Telehealth: Payer: Self-pay | Admitting: Cardiovascular Disease

## 2020-11-20 MED ORDER — NITROGLYCERIN 0.4 MG SL SUBL: 0.4000 mg | SUBLINGUAL_TABLET | SUBLINGUAL | 0 refills | Status: DC | PRN

## 2020-11-20 NOTE — Telephone Encounter (Signed)
*  STAT* If patient is at the pharmacy, call can be transferred to refill team.   1. Which medications need to be refilled? (please list name of each medication and dose if known) Nitroglycerin  2. Which pharmacy/location (including street and city if local pharmacy) is medication to be sent to?  [facility]  3. Do they need a 30 day or 90 day supply? 1 bottle

## 2020-11-20 NOTE — Telephone Encounter (Signed)
Pt's medication was sent to pt's pharmacy as requested. Confirmation received.  °

## 2020-11-25 ENCOUNTER — Other Ambulatory Visit: Payer: Self-pay

## 2020-11-25 ENCOUNTER — Ambulatory Visit (INDEPENDENT_AMBULATORY_CARE_PROVIDER_SITE_OTHER): Payer: Medicare Other | Admitting: Internal Medicine

## 2020-11-25 ENCOUNTER — Encounter: Payer: Self-pay | Admitting: Internal Medicine

## 2020-11-25 ENCOUNTER — Telehealth: Payer: Self-pay | Admitting: Cardiovascular Disease

## 2020-11-25 VITALS — BP 138/54 | HR 68 | Ht 65.0 in | Wt 123.0 lb

## 2020-11-25 DIAGNOSIS — T466X5A Adverse effect of antihyperlipidemic and antiarteriosclerotic drugs, initial encounter: Secondary | ICD-10-CM | POA: Diagnosis not present

## 2020-11-25 DIAGNOSIS — E785 Hyperlipidemia, unspecified: Secondary | ICD-10-CM | POA: Diagnosis not present

## 2020-11-25 DIAGNOSIS — M791 Myalgia, unspecified site: Secondary | ICD-10-CM

## 2020-11-25 DIAGNOSIS — I25118 Atherosclerotic heart disease of native coronary artery with other forms of angina pectoris: Secondary | ICD-10-CM | POA: Diagnosis not present

## 2020-11-25 MED ORDER — EZETIMIBE 10 MG PO TABS
10.0000 mg | ORAL_TABLET | Freq: Every day | ORAL | 3 refills | Status: DC
Start: 2020-11-25 — End: 2021-01-13

## 2020-11-25 MED ORDER — METOPROLOL SUCCINATE ER 25 MG PO TB24
25.0000 mg | ORAL_TABLET | Freq: Every day | ORAL | 0 refills | Status: DC
Start: 2020-11-25 — End: 2021-01-13

## 2020-11-25 NOTE — Telephone Encounter (Signed)
Pt's medication was sent to pt's pharmacy as requested. Confirmation received.  °

## 2020-11-25 NOTE — Patient Instructions (Addendum)
Medication Instructions:  STAY on zetia 10mg  daily  *If you need a refill on your cardiac medications before your next appointment, please call your pharmacy*   Lab Work: LIPID PANEL today   If you have labs (blood work) drawn today and your tests are completely normal, you will receive your results only by: Marland Kitchen MyChart Message (if you have MyChart) OR . A paper copy in the mail If you have any lab test that is abnormal or we need to change your treatment, we will call you to review the results.   Testing/Procedures: NONE   Follow-Up: At [facility], you and your health needs are our priority.  As part of our continuing mission to provide you with exceptional heart care, we have created designated Provider Care Teams.  These Care Teams include your primary Cardiologist (physician) and Advanced Practice Providers (APPs -  Physician Assistants and Nurse Practitioners) who all work together to provide you with the care you need, when you need it.  We recommend signing up for the patient portal called "MyChart".  Sign up information is provided on this After Visit Summary.  MyChart is used to connect with patients for Virtual Visits (Telemedicine).  Patients are able to view lab/test results, encounter notes, upcoming appointments, etc.  Non-urgent messages can be sent to your provider as well.   To learn more about what you can do with MyChart, go to [email or web address].    Your next appointment:   AS NEEDED with Dr. Debara Pickett for lipid clinic  Continue cardiology care with Dr. Acie Fredrickson

## 2020-11-25 NOTE — Telephone Encounter (Signed)
*  STAT* If patient is at the pharmacy, call can be transferred to refill team.   1. Which medications need to be refilled? (please list name of each medication and dose if known) metoprolol succinate (TOPROL-XL) 25 MG 24 hr tablet  2. Which pharmacy/location (including street and city if local pharmacy) is medication to be sent to? [place], [place]  3. Do they need a 30 day or 90 day supply? 90 day supply  Pt has appt scheduled for 01/13/21.

## 2020-11-25 NOTE — Progress Notes (Signed)
LIPID CLINIC CONSULT NOTE  Chief Complaint:  Follow-up dyslipidemia  Primary Care Physician: Nancy Frizzle, MD  Primary Cardiologist:  Nancy Moores, MD  HPI:  Nancy [name] is a 75 y.o. female who is being seen today for the evaluation of dyslipidemia at the request of Nancy Frizzle, MD.  This is a pleasant 75 year old female with a history of coronary artery disease, dyslipidemia, hypertension and statin intolerance.  Recently she has been having some symptoms concerning for angina and saw Dr. Acie Fredrickson.  He ordered a stress test.  In addition he referred her to the lipid clinic for evaluation and possible therapeutic options.  She also needed updated lipid profile.  A year ago her total cholesterol is 120, triglycerides 91, HDL 51 and LDL was 51, however she was on some therapy at the time.  Since then she has discontinued all of her statin medications.  I did get a repeat lipid profile on her today which showed a expected increase in her total cholesterol 171, triglycerides 86, HDL 66 and LDL of 89.  Her goal LDL is less than 70.  10/30/2019  Nancy [name] returns today for follow-up.  Based on her last lipid numbers which showed an LDL in the 80s, I had recommended adding ezetimibe.  She has been taking that however did have some concerns.  She noted today that she has had some itching, swelling and erythema around her eyes, over her cheeks and behind her ears.  There is no evidence of any desquamation and she has tried topical creams, emollients and hydrocortisone briefly without improvement.  11/25/2020  Nancy [name] is seen today in follow-up.  Previous follow-up was scheduled as needed however she was noted to run out of her ezetimibe and the pharmacy recommended follow-up with me to reorder it.  She is scheduled to see her cardiologist in this month.  Overall she seems to be tolerating the ezetimibe.  Her LDL cholesterol had come down to 85 in May with total cholesterol 167, HDL 54 and  triglycerides 184.  According to her daughter she is fairly sedentary.  She tends to eat out a lot at a diner with mostly home style foods.  Her target LDL cholesterol is less than 70 given her history of coronary disease.  PMHx:  Past Medical History:  Diagnosis Date  . Coronary atherosclerosis of native coronary artery   . Dyslipidemia   . Essential hypertension, benign   . History of colonic polyps   . Osteoporosis   . 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 CORONARY ANGIOPLASTY WITH STENT PLACEMENT  2010   3 times  . CORONARY ARTERY BYPASS GRAFT  2009  . CRANIOTOMY  04/27/2012   Procedure: CRANIOTOMY HEMATOMA EVACUATION SUBDURAL;  Surgeon: Nancy Moore, MD;  Location: [facility];  Service: Neurosurgery;  Laterality: Right;  Right Frontal Burr hole for Subdural Hematoma  . NASAL SINUS SURGERY    . POLYPECTOMY      FAMHx:  Family History  Problem Relation Age of Onset  . Hypertension Mother   . Cancer Mother        Colon  . Kidney disease Mother   . Memory loss Mother   . Heart attack Father   . Hypertension Brother   . Stroke Maternal Grandmother   . Memory loss Maternal Grandmother   . Cancer Maternal Grandfather        Intestinal  SOCHx:   reports that she has never smoked. She has never used smokeless tobacco. She reports current alcohol use. She reports that she does not use drugs.  ALLERGIES:  Allergies  Allergen Reactions  . Alendronate Shortness Of Breath and Swelling  . Atorvastatin Other (See Comments)    Muscle cramps  . Bactrim [Sulfamethoxazole-Trimethoprim] Other (See Comments)    Blisters/skin peeling, nausea  . Buprenorphine Hcl Nausea Only  . Morphine Nausea Only    OTHER  . Morphine And Related Nausea Only  . Prednisone Other (See Comments)    Unknown  . Sulfa Antibiotics Itching    Blisters/skin peeling  . Sulfasalazine Itching    Blisters/skin peeling  . Sulfonamide Derivatives  Other (See Comments)    Blisters/skin peeling    ROS: Pertinent items noted in HPI and remainder of comprehensive ROS otherwise negative.  HOME MEDS: Current Outpatient Medications on File Prior to Visit  Medication Sig Dispense Refill  . aspirin 81 MG tablet Take 81 mg by mouth daily.    . cyanocobalamin 1000 MCG tablet Take 1,000 mcg by mouth daily.    Nancy [name] donepezil (ARICEPT) 10 MG tablet Take 1 tablet daily 90 tablet 3  . escitalopram (LEXAPRO) 20 MG tablet Take 1 tablet by mouth once daily 90 tablet 1  . ezetimibe (ZETIA) 10 MG tablet Take 1 tablet by mouth once daily 30 tablet 3  . nitroGLYCERIN (NITROSTAT) 0.4 MG SL tablet Place 1 tablet (0.4 mg total) under the tongue every 5 (five) minutes as needed for chest pain. Please make overdue appt with Dr. Acie Fredrickson before anymore refills. Thank you 1st attempt 25 tablet 0  . traZODone (DESYREL) 100 MG tablet Take 1 tablet (100 mg total) by mouth at bedtime as needed for sleep. 30 tablet 2   No current facility-administered medications on file prior to visit.    LABS/IMAGING: No results found for this or any previous visit (from the past 48 hour(s)). No results found.  LIPID PANEL:    Component Value Date/Time   CHOL 167 02/20/2020 1135   CHOL 185 10/30/2019 1619   TRIG 184 (H) 02/20/2020 1135   HDL 54 02/20/2020 1135   HDL 70 10/30/2019 1619   CHOLHDL 3.1 02/20/2020 1135   VLDL 14 06/23/2016 0814   LDLCALC 85 02/20/2020 1135    WEIGHTS: Wt Readings from Last 3 Encounters:  11/25/20 123 lb (55.8 kg)  09/05/20 123 lb (55.8 kg)  07/30/20 120 lb (54.4 kg)    VITALS: BP (!) 138/54 (BP Location: Left Arm, Patient Position: Sitting, Cuff Size: Normal)   Pulse 68   Ht 5\' 5"  (1.651 m)   Wt 123 lb (55.8 kg)   BMI 20.47 kg/m   EXAM: Deferred  EKG: Deferred  ASSESSMENT: 1. Mixed dyslipidemia 2. Statin intolerance 3. History of coronary artery disease with prior CABG and PCI 4. Hypertension 5. Facial rash  PLAN: 1.    Nancy [name] seems to be tolerating ezetimibe.  Her LDL cholesterol come down to 85 which is still above target of less than 70.  She may need additional therapy.  Her labs were back in May 2021.  We will plan repeat lipids today.  I am happy to coordinate additional therapies.  She wishes to follow-up with Dr. Acie Fredrickson and has an appointment later this month.  He could certainly refill her lipid medications as well.  Pixie Casino, MD, [facility], [place] Director of the Advanced Lipid Disorders &  Cardiovascular Risk Reduction Clinic Diplomate of the American Board of Clinical Lipidology Attending Cardiologist  Direct Dial: [phone]  Fax: [phone]  Website:  [email or web address] 11/25/2020, 3:51 PM

## 2020-11-26 LAB — LIPID PANEL
Chol/HDL Ratio: 3 ratio (ref 0.0–4.4)
Cholesterol, Total: 210 mg/dL — ABNORMAL HIGH (ref 100–199)
HDL: 70 mg/dL (ref >39)
LDL Chol Calc (NIH): 127 mg/dL — ABNORMAL HIGH (ref 0–99)
Triglycerides: 71 mg/dL (ref 0–149)
VLDL Cholesterol Cal: 13 mg/dL (ref 5–40)

## 2020-12-09 ENCOUNTER — Encounter: Payer: Self-pay | Admitting: Internal Medicine

## 2020-12-17 DIAGNOSIS — H524 Presbyopia: Secondary | ICD-10-CM | POA: Diagnosis not present

## 2020-12-17 DIAGNOSIS — H04123 Dry eye syndrome of bilateral lacrimal glands: Secondary | ICD-10-CM | POA: Diagnosis not present

## 2020-12-17 DIAGNOSIS — H5500 Unspecified nystagmus: Secondary | ICD-10-CM | POA: Diagnosis not present

## 2020-12-17 DIAGNOSIS — H2513 Age-related nuclear cataract, bilateral: Secondary | ICD-10-CM | POA: Diagnosis not present

## 2021-01-13 ENCOUNTER — Ambulatory Visit (INDEPENDENT_AMBULATORY_CARE_PROVIDER_SITE_OTHER): Payer: Medicare Other | Admitting: Cardiovascular Disease

## 2021-01-13 ENCOUNTER — Encounter: Payer: Self-pay | Admitting: Cardiovascular Disease

## 2021-01-13 ENCOUNTER — Other Ambulatory Visit: Payer: Self-pay

## 2021-01-13 VITALS — BP 142/54 | HR 64 | Ht 65.0 in | Wt 125.0 lb

## 2021-01-13 DIAGNOSIS — I2510 Atherosclerotic heart disease of native coronary artery without angina pectoris: Secondary | ICD-10-CM | POA: Diagnosis not present

## 2021-01-13 MED ORDER — METOPROLOL SUCCINATE ER 25 MG PO TB24: 25.0000 mg | ORAL_TABLET | Freq: Every day | ORAL | 3 refills | Status: DC

## 2021-01-13 MED ORDER — EZETIMIBE 10 MG PO TABS: 10.0000 mg | ORAL_TABLET | Freq: Every day | ORAL | 3 refills | Status: DC

## 2021-01-13 NOTE — Patient Instructions (Addendum)
Look for Nancy [name]  and [employer] at [employer]   Medication Instructions:  Your physician recommends that you continue on your current medications as directed. Please refer to the Current Medication list given to you today.  *If you need a refill on your cardiac medications before your next appointment, please call your pharmacy*   Lab Work: Lipids, ALT, BMET Your physician recommends that you return for lab work in:1 year on the day of or a few days before your office visit with Dr. Acie Fredrickson.  You will need to FAST for this appointment - nothing to eat or drink after midnight the night before except water.    Testing/Procedures: none   Follow-Up: At [facility], you and your health needs are our priority.  As part of our continuing mission to provide you with exceptional heart care, we have created designated Provider Care Teams.  These Care Teams include your primary Cardiologist (physician) and Advanced Practice Providers (APPs -  Physician Assistants and Nurse Practitioners) who all work together to provide you with the care you need, when you need it.  Your next appointment:   1 year(s)  The format for your next appointment:   In Person  Provider:   You may see Mertie Moores, MD or one of the following Advanced Practice Providers on your designated Care Team:    Richardson Dopp, PA-C  [place], [place]

## 2021-01-13 NOTE — Progress Notes (Signed)
Cardiology Office Note   Date:  01/13/2021   ID:  Nancy [name], Nancy [name] 08-22-1946, MRN [id number]  PCP:  Susy Frizzle, MD  Cardiologist:   Mertie Moores, MD   Chief Complaint  Patient presents with  . Coronary Artery Disease  . Hyperlipidemia   1. Coronary artery disease-status post coronary artery bypass graft- 2008 Status post PCI in 2010. 2. craniotomy for subdural hematoma. 3.  .  hyperlipidemia-    Nancy [name] is doing well from a cardiac standpoint.  She is having lots of leg cramps. Takes Tylenol PM each night for cramps. She's tried taking mustard and this does not help. Her atorvastatin dose has been cut in 1/2 but she continues to have joint pain, muscle ache. She also has numbness in her feet,  Has lots of ankle, knee and hip pain    January 03, 2015:   Nancy [name] is a 75 y.o. female who presents for follow-up of her coronary artery disease.  Sept. 13, 2016:  Doing well.  Normal stress myoview in March , 2016, Lipids looked great  Feeling well.  Not sleeping well.   January 07, 2106:  Has had some palpitations when she lies on her left side to sleep  Has worn the monitor in the past Does not want to necessarily wear it again  Has bilateral hand splints on - arthritis   No CP or dyspnea. Has slowed down with her walking  - walking slower and perhaps not as far.   Sept. 18, 2017  Has occasional irreg HR when she lays down on her left side.   Able to do her normal activities without cardiac problems . No CP or dyspnea . Lots of arthritis symptoms . Has some memory loss following her subdural hematoma  January 04, 2017:  Multiple concerns / complaints today   Has lots of muscle pains  , joint pain , Arthritis  Brief Dizziness when she stands up  Constipation Fatigues easily    No angina , Very rare episodes of chest tightness.  Has stopped some of her OTC meds ( fish oil, Vit B6, )   August 26, 2018:  Hagan is  having fatigue  - more fatigue and shortness of breath with exertion  Very similar to prior to her CABG  Has occasional palpitations   April 11, 2018:   Nancy [name] is seen today for follow up of coronary artery disease. She was having some shortness of breath symptoms were very similar to her previous episodes of angina. Myoview study in November, 2018 was low risk study with no evidence of ischemia.  Echocardiogram was normal.  Has been having some palpitations recently .   Have resolved today Does not have much energy .   July 21, 2019:  Nancy [name] is seen today for follow-up visit.  She has a history of coronary artery disease.  She seen with her daughter , Nancy [name] Has some dizziness when she turns and orthostasis with standing up .   Has occasional chest pressure when she wakes up .  January 14, 9891: Nancy [name] is seen today for follow up of her CAD,  Seen with daughter, Nancy [name].  Has had some orthostasis Has not been sleeping enough ,  Falls asleep and wakes up at 3 Has some chest heaviness Is getting ready to move.   Seems to be a bit stressed out  Going to [place] .     Past Medical History:  Diagnosis Date  . Coronary atherosclerosis  of native coronary artery   . Dyslipidemia   . Essential hypertension, benign   . History of colonic polyps   . Osteoporosis   . Subdural hematoma (Nancy [name]) 04/27/2012    Past Surgical History:  Procedure Laterality Date  . CORONARY ANGIOPLASTY WITH STENT PLACEMENT  2010   3 times  . CORONARY ARTERY BYPASS GRAFT  2009  . CRANIOTOMY  04/27/2012   Procedure: CRANIOTOMY HEMATOMA EVACUATION SUBDURAL;  Surgeon: Eustace Moore, MD;  Location: [facility];  Service: Neurosurgery;  Laterality: Right;  Right Frontal Burr hole for Subdural Hematoma  . NASAL SINUS SURGERY    . POLYPECTOMY       Current Outpatient Medications  Medication Sig Dispense Refill  . aspirin 81 MG tablet Take 81 mg by mouth daily.    . cyanocobalamin 1000 MCG tablet Take 1,000  mcg by mouth daily.    Marland Kitchen donepezil (ARICEPT) 10 MG tablet Take 1 tablet daily 90 tablet 3  . escitalopram (LEXAPRO) 20 MG tablet Take 1 tablet by mouth once daily 90 tablet 1  . nitroGLYCERIN (NITROSTAT) 0.4 MG SL tablet Place 1 tablet (0.4 mg total) under the tongue every 5 (five) minutes as needed for chest pain. Please make overdue appt with Dr. Acie Fredrickson before anymore refills. Thank you 1st attempt 25 tablet 0  . Omega-3 Fatty Acids (FISH OIL PO) Take by mouth.    . traZODone (DESYREL) 100 MG tablet Take 1 tablet (100 mg total) by mouth at bedtime as needed for sleep. 30 tablet 2  . VITAMIN D PO Take by mouth.    Marland Kitchen VITAMIN E PO Take by mouth.    . ezetimibe (ZETIA) 10 MG tablet Take 1 tablet (10 mg total) by mouth daily. 90 tablet 3  . metoprolol succinate (TOPROL-XL) 25 MG 24 hr tablet Take 1 tablet (25 mg total) by mouth daily. 90 tablet 3   No current facility-administered medications for this visit.    Allergies:   Alendronate, Atorvastatin, Bactrim [sulfamethoxazole-trimethoprim], Buprenorphine hcl, Morphine, Morphine and related, Prednisone, Sulfa antibiotics, Sulfasalazine, and Sulfonamide derivatives    Social History:  The patient  reports that she has never smoked. She has never used smokeless tobacco. She reports current alcohol use. She reports that she does not use drugs.   Family History:  The patient's family history includes Cancer in her maternal grandfather and mother; Heart attack in her father; Hypertension in her brother and mother; Kidney disease in her mother; Memory loss in her maternal grandmother and mother; Stroke in her maternal grandmother.    ROS:  Please see the history of present illness.  Reviewed and current history.  Otherwise negative.   Physical Exam: Blood pressure (!) 142/54, pulse 64, height 5\' 5"  (1.651 m), weight 125 lb (56.7 kg), SpO2 96 %.  GEN:  Well nourished, well developed in no acute distress HEENT: Normal NECK: No JVD; No carotid  bruits LYMPHATICS: No lymphadenopathy CARDIAC: RRR , no murmurs, rubs, gallops RESPIRATORY:  Clear to auscultation without rales, wheezing or rhonchi  ABDOMEN: Soft, non-tender, non-distended MUSCULOSKELETAL:  No edema; No deformity  SKIN: Warm and dry NEUROLOGIC:  Alert and oriented x 3    EKG: January 13, 2021: Normal sinus rhythm at 64.  No ST or T wave changes.   Recent Labs: 02/20/2020: ALT 15; BUN 20; Creat 0.94; Hemoglobin 13.4; Platelets 221; Potassium 5.4; Sodium 142    Lipid Panel    Component Value Date/Time   CHOL 210 (H) 11/25/2020 1630  TRIG 71 11/25/2020 1630   HDL 70 11/25/2020 1630   CHOLHDL 3.0 11/25/2020 1630   CHOLHDL 3.1 02/20/2020 1135   VLDL 14 06/23/2016 0814   LDLCALC 127 (H) 11/25/2020 1630   LDLCALC 85 02/20/2020 1135     Wt Readings from Last 3 Encounters:  01/13/21 125 lb (56.7 kg)  11/25/20 123 lb (55.8 kg)  09/05/20 123 lb (55.8 kg)     Other studies Reviewed: Additional studies/ records that were reviewed today include: . Review of the above records demonstrates:    ASSESSMENT AND PLAN:  1. Coronary artery disease-status post coronary artery bypass graft- 2008 Status post PCI in 2010.   No angina  i'll see her in a year   2. craniotomy for subdural hematoma.  3.   hyperlipidemia-       Has seen Dr. Debara Pickett,   Was started on Zetia  .willl recheck in a year   4.  Fatigue:    I.  Orthostatic hypotension.     Labs/ tests ordered today include:   Orders Placed This Encounter  Procedures  . EKG 12-Lead    Disposition:    Signed, Mertie Moores, MD  01/13/2021 5:33 PM    [facility] [place], [place],   [address] Phone: [phone]; Fax: [phone]

## 2021-02-27 ENCOUNTER — Other Ambulatory Visit: Payer: Self-pay | Admitting: Neurology

## 2021-03-14 ENCOUNTER — Other Ambulatory Visit: Payer: Self-pay | Admitting: Neurology

## 2021-04-14 DIAGNOSIS — R278 Other lack of coordination: Secondary | ICD-10-CM | POA: Diagnosis not present

## 2021-04-16 DIAGNOSIS — R278 Other lack of coordination: Secondary | ICD-10-CM | POA: Diagnosis not present

## 2021-04-23 DIAGNOSIS — R278 Other lack of coordination: Secondary | ICD-10-CM | POA: Diagnosis not present

## 2021-04-25 DIAGNOSIS — R278 Other lack of coordination: Secondary | ICD-10-CM | POA: Diagnosis not present

## 2021-04-28 DIAGNOSIS — R278 Other lack of coordination: Secondary | ICD-10-CM | POA: Diagnosis not present

## 2021-04-30 DIAGNOSIS — R278 Other lack of coordination: Secondary | ICD-10-CM | POA: Diagnosis not present

## 2021-05-05 DIAGNOSIS — R278 Other lack of coordination: Secondary | ICD-10-CM | POA: Diagnosis not present

## 2021-05-07 DIAGNOSIS — R278 Other lack of coordination: Secondary | ICD-10-CM | POA: Diagnosis not present

## 2021-05-12 DIAGNOSIS — R278 Other lack of coordination: Secondary | ICD-10-CM | POA: Diagnosis not present

## 2021-05-14 DIAGNOSIS — R278 Other lack of coordination: Secondary | ICD-10-CM | POA: Diagnosis not present

## 2021-05-19 DIAGNOSIS — R278 Other lack of coordination: Secondary | ICD-10-CM | POA: Diagnosis not present

## 2021-05-23 ENCOUNTER — Other Ambulatory Visit: Payer: Self-pay

## 2021-05-23 ENCOUNTER — Ambulatory Visit (INDEPENDENT_AMBULATORY_CARE_PROVIDER_SITE_OTHER): Payer: Medicare Other | Admitting: Neurology

## 2021-05-23 ENCOUNTER — Encounter: Payer: Self-pay | Admitting: Neurology

## 2021-05-23 VITALS — BP 119/63 | HR 67 | Wt 130.0 lb

## 2021-05-23 DIAGNOSIS — F0281 Dementia in other diseases classified elsewhere with behavioral disturbance: Secondary | ICD-10-CM

## 2021-05-23 DIAGNOSIS — I2510 Atherosclerotic heart disease of native coronary artery without angina pectoris: Secondary | ICD-10-CM | POA: Diagnosis not present

## 2021-05-23 DIAGNOSIS — R278 Other lack of coordination: Secondary | ICD-10-CM | POA: Diagnosis not present

## 2021-05-23 DIAGNOSIS — G301 Alzheimer's disease with late onset: Secondary | ICD-10-CM

## 2021-05-23 MED ORDER — ESCITALOPRAM OXALATE 20 MG PO TABS: 20.0000 mg | ORAL_TABLET | Freq: Every day | ORAL | 3 refills | Status: DC

## 2021-05-23 MED ORDER — MEMANTINE HCL 10 MG PO TABS: ORAL_TABLET | ORAL | 11 refills | Status: DC

## 2021-05-23 MED ORDER — DONEPEZIL HCL 10 MG PO TABS: ORAL_TABLET | ORAL | 3 refills | Status: DC

## 2021-05-23 NOTE — Patient Instructions (Addendum)
Start Memantine '10mg'$ : take 1 tablet every night for 2 weeks, then increase to 1 tablet twice a day  2. Continue Donepezil '10mg'$  daily  3. Continue Lexapro '20mg'$  daily  4. For sleep cognitive behavioral therapy, recommendation is [facility]. The physician is Dr. Terrance Mass. Phone number is [phone], fax number is [phone]. His address is [address] [place], [place] [address].    4. Follow-up in 6 months, call for any changes   FALL PRECAUTIONS: Be cautious when walking. Scan the area for obstacles that may increase the risk of trips and falls. When getting up in the mornings, sit up at the edge of the bed for a few minutes before getting out of bed. Consider elevating the bed at the head end to avoid drop of blood pressure when getting up. Walk always in a well-lit room (use night lights in the walls). Avoid area rugs or power cords from appliances in the middle of the walkways. Use a walker or a cane if necessary and consider physical therapy for balance exercise. Get your eyesight checked regularly.  HOME SAFETY: Consider the safety of the kitchen when operating appliances like stoves, microwave oven, and blender. Consider having supervision and share cooking responsibilities until no longer able to participate in those. Accidents with firearms and other hazards in the house should be identified and addressed as well.  DRIVING: Regarding driving, in patients with progressive memory problems, driving will be impaired. We advise to have someone else do the driving if trouble finding directions or if minor accidents are reported. Independent driving assessment is available to determine safety of driving.  ABILITY TO BE LEFT ALONE: If patient is unable to contact 911 operator, consider using LifeLine, or when the need is there, arrange for someone to stay with patients. Smoking is a fire hazard, consider supervision or cessation. Risk of wandering  should be assessed by caregiver and if detected at any point, supervision and safe proof recommendations should be instituted.  MEDICATION SUPERVISION: Inability to self-administer medication needs to be constantly addressed. Implement a mechanism to ensure safe administration of the medications.  RECOMMENDATIONS FOR ALL PATIENTS WITH MEMORY PROBLEMS: 1. Continue to exercise (Recommend 30 minutes of walking everyday, or 3 hours every week) 2. Increase social interactions - continue going to [place] and enjoy social gatherings with friends and family 3. Eat healthy, avoid fried foods and eat more fruits and vegetables 4. Maintain adequate blood pressure, blood sugar, and blood cholesterol level. Reducing the risk of stroke and cardiovascular disease also helps promoting better memory. 5. Avoid stressful situations. Live a simple life and avoid aggravations. Organize your time and prepare for the next day in anticipation. 6. Sleep well, avoid any interruptions of sleep and avoid any distractions in the bedroom that may interfere with adequate sleep quality 7. Avoid sugar, avoid sweets as there is a strong link between excessive sugar intake, diabetes, and cognitive impairment We discussed the Mediterranean diet, which has been shown to help patients reduce the risk of progressive memory disorders and reduces cardiovascular risk. This includes eating fish, eat fruits and green leafy vegetables, nuts like almonds and hazelnuts, walnuts, and also use olive oil. Avoid fast foods and fried foods as much as possible. Avoid sweets and sugar as sugar use has been linked to worsening of memory function.  There is always a concern of gradual progression of memory problems. If this is the case, then we may need to adjust level of  care according to patient needs. Support, both to the patient and caregiver, should then be put into place.

## 2021-05-23 NOTE — Progress Notes (Signed)
NEUROLOGY FOLLOW UP OFFICE NOTE  Nancy [name] [id number] [birth year]  HISTORY OF PRESENT ILLNESS: I had the pleasure of seeing Nancy [name] in follow-up in the neurology clinic on 05/23/2021.  The patient was last seen over a year ago for memory loss. She is again accompanied by her daughter who helps supplement the history today.  Records and images were personally reviewed where available.  Since her last visit, she had repeat Neuropsychological testing done 06/2020 with profile showing memory storage type problems, naming problems, and difficulties with executive functioning and processing speed. She was also extremely anxious during testing. There was decline compared to prior, with a diagnosis of mild Alzheimer's disease.   Since her last visit, she moved to [place] last April 2022. She rarely drives, her family is with her when she does. She got lost trying to get to our office last Spring. Her daughter fills her pillbox, they report she is good with taking them, mostly forgetting when she is out of her routine. Her daughters manage finances. She does not cook, surprised to learn there is a stove in her apartment, she eats in the dining hall. She is independent with dressing and bathing, no hygiene concerns. She has started to join the exercise groups. She has OT coming periodically. Sleep is sometimes interrupted, she wakes up at 2-3am, no naps. Mood is pretty good, her daughter agrees, stress has gotten better. She was previously always reporting she was feeling tired, with improvement in depression, she has not talked about being as tired the past couple of weeks. No hallucinations or paranoia. She denies any headaches, dizziness, focal numbness/tingling/weakness, no falls.    History on Initial Assessment 04/06/2018: This is a pleasant 75 year old ambidextrous woman with a history of right chronic subdural hematoma s/p craniectomy in 2013, hyperlipidemia, hypertension,  CAD, presenting for evaluation of worsening memory. She and her husband reports memory changes started soon after her brain surgery in 2013, but have become more progressive and significantly noticeable in the past year. Soon after surgery, her husband noticed that she would have word-finding difficulties, mixing up [place] and [place]. Over the past year, the word-finding difficulties have worsened, she states she sometimes forgets her grandparents names, when she meant to say grandchildren. She got a shock around 1-1.5 years ago at the doctor's office when she could not remember her address and had to look at her drivers license. She loses things a lot at home, she has 9 pairs of reading glasses and always has a hard time finding them. She does not cook as much and denies leaving the stove on. She continues to manage bills and medications without significant difficulties, she occasionally forgets her medications when they are busy. She stopped driving after heart surgery years ago, she denied getting lost but felt afraid. She is able to bathe and dress independently. Over the past year, she has become quite anxious about minor things, sometimes the dog barking would make her very nervous. She has found taking [place] helps with this. She has a prescription for Xanax but does not take it regularly. She was noted to be anxious at several points during the visit today.   She denies any regular headaches but has a light one today from running around. She has light dizziness when standing at times. She denies any diplopia, dysarthria/dysphagia, focal numbness/tingling/weakness, both feet are numb. She has low back pain and occasional neck pain. She has constipation. No anosmia, tremors,  REM behavior disorder. Her husband denies any paranoia or hallucinations. She does not sleep well, no wandering behavior. Her mother and maternal grandmother had memory issues. Aside from head injury in 2013 (hit her head  getting up, leading to subdural hematoma), no other head injuries or falls. She rarely drinks alcohol.  She had an MRI brain with and without contrast in July 2019 which I personally reviewed, no acute changes, there was mild diffuse volume loss, minimal chronic microvascular disease.    PAST MEDICAL HISTORY: Past Medical History:  Diagnosis Date   Coronary atherosclerosis of native coronary artery    Dyslipidemia    Essential hypertension, benign    History of colonic polyps    Osteoporosis    Subdural hematoma ([place]) 04/27/2012    MEDICATIONS: Current Outpatient Medications on File Prior to Visit  Medication Sig Dispense Refill   aspirin 81 MG tablet Take 81 mg by mouth daily.     cyanocobalamin 1000 MCG tablet Take 1,000 mcg by mouth daily.     donepezil (ARICEPT) 10 MG tablet Take 1 tablet by mouth once daily 90 tablet 0   escitalopram (LEXAPRO) 20 MG tablet Take 1 tablet by mouth once daily 90 tablet 0   ezetimibe (ZETIA) 10 MG tablet Take 1 tablet (10 mg total) by mouth daily. 90 tablet 3   metoprolol succinate (TOPROL-XL) 25 MG 24 hr tablet Take 1 tablet (25 mg total) by mouth daily. 90 tablet 3   nitroGLYCERIN (NITROSTAT) 0.4 MG SL tablet Place 1 tablet (0.4 mg total) under the tongue every 5 (five) minutes as needed for chest pain. Please make overdue appt with Dr. Acie Fredrickson before anymore refills. Thank you 1st attempt 25 tablet 0   Omega-3 Fatty Acids (FISH OIL PO) Take by mouth.     traZODone (DESYREL) 100 MG tablet Take 1 tablet (100 mg total) by mouth at bedtime as needed for sleep. 30 tablet 2   VITAMIN D PO Take by mouth.     VITAMIN E PO Take by mouth.     No current facility-administered medications on file prior to visit.    ALLERGIES: Allergies  Allergen Reactions   Alendronate Shortness Of Breath and Swelling   Atorvastatin Other (See Comments)    Muscle cramps   Bactrim [Sulfamethoxazole-Trimethoprim] Other (See Comments)    Blisters/skin peeling, nausea    Buprenorphine Hcl Nausea Only   Morphine Nausea Only    OTHER   Morphine And Related Nausea Only   Prednisone Other (See Comments)    Unknown   Sulfa Antibiotics Itching    Blisters/skin peeling   Sulfasalazine Itching    Blisters/skin peeling   Sulfonamide Derivatives Other (See Comments)    Blisters/skin peeling    FAMILY HISTORY: Family History  Problem Relation Age of Onset   Hypertension Mother    Cancer Mother        Colon   Kidney disease Mother    Memory loss Mother    Heart attack Father    Hypertension Brother    Stroke Maternal Grandmother    Memory loss Maternal Grandmother    Cancer Maternal Grandfather        Intestinal    SOCIAL HISTORY: Social History   Socioeconomic History   Marital status: Widowed    Spouse name: Not on file   Number of children: Not on file   Years of education: Not on file   Highest education level: Not on file  Occupational History   Not on file  Tobacco Use   Smoking status: Never   Smokeless tobacco: Never  Vaping Use   Vaping Use: Never used  Substance and Sexual Activity   Alcohol use: Yes    Comment: occasional glass wine   Drug use: No   Sexual activity: Not Currently    Partners: Male    Comment: 1ST INTERCOURSE- 55, PARTNERS- 1 , MARRIED - 14 YRS   Other Topics Concern   Not on file  Social History Narrative   Pt lives in 2 story home with her husband, Nancy [name]   Has 2 adult daughters, 2 granddaughters   12th grade education   Retired Charity fundraiser    Social Determinants of Radio broadcast assistant Strain: Not on Art therapist Insecurity: Not on file  Transportation Needs: Not on file  Physical Activity: Not on file  Stress: Not on file  Social Connections: Not on file  Intimate Partner Violence: Not on file     PHYSICAL EXAM: Vitals:   05/23/21 1523  BP: 119/63  Pulse: 67  SpO2: 97%   General: No acute distress Head:  Normocephalic/atraumatic Skin/Extremities: No rash, no edema Neurological  Exam: alert and awake. No aphasia or dysarthria. Fund of knowledge is appropriate.  Recent and remote memory are impaired. Attention and concentration are normal.   Cranial nerves: Pupils equal, round. Extraocular movements intact with no nystagmus. Visual fields full.  No facial asymmetry.  Motor: Bulk and tone normal, muscle strength 5/5 throughout with no pronator drift.   Finger to nose testing intact.  Gait narrow-based and steady, able to tandem walk adequately.  Romberg negative.   IMPRESSION: This is a pleasant 75 yo ambidextrous woman with a history of right chronic subdural hematoma s/p craniectomy in 2013, hyperlipidemia, hypertension, CAD, with worsening memory. MRI brain no acute changes, there was mild diffuse atrophy. Repeat  Neuropsychological testing done 06/2020 with profile showing memory storage type problems, naming problems, and difficulties with executive functioning and processing speed. She was also extremely anxious during testing. There was decline compared to prior, with a diagnosis of mild Alzheimer's disease. She is on Donepezil '10mg'$  daily, we discussed adding on Memantine '10mg'$  qhs x 2 weeks, then increase to '10mg'$  BID. Side effects discussed. Continue Lexapro '20mg'$  daily for mood. She may benefit from cognitive behavioral therapy to help with sleep, information provided. Continue close supervision. Follow-up in 6 months, call for any changes.     Thank you for allowing me to participate in her care.  Please do not hesitate to call for any questions or concerns.    Ellouise Newer, M.D.   CC: Dr. Dennard Schaumann

## 2021-06-03 DIAGNOSIS — I25810 Atherosclerosis of coronary artery bypass graft(s) without angina pectoris: Secondary | ICD-10-CM | POA: Diagnosis not present

## 2021-06-03 DIAGNOSIS — R5382 Chronic fatigue, unspecified: Secondary | ICD-10-CM | POA: Diagnosis not present

## 2021-06-03 DIAGNOSIS — Z0000 Encounter for general adult medical examination without abnormal findings: Secondary | ICD-10-CM | POA: Diagnosis not present

## 2021-06-03 DIAGNOSIS — E538 Deficiency of other specified B group vitamins: Secondary | ICD-10-CM | POA: Diagnosis not present

## 2021-06-03 DIAGNOSIS — F419 Anxiety disorder, unspecified: Secondary | ICD-10-CM | POA: Diagnosis not present

## 2021-06-03 DIAGNOSIS — I10 Essential (primary) hypertension: Secondary | ICD-10-CM | POA: Diagnosis not present

## 2021-06-03 DIAGNOSIS — K5900 Constipation, unspecified: Secondary | ICD-10-CM | POA: Diagnosis not present

## 2021-06-03 DIAGNOSIS — G309 Alzheimer's disease, unspecified: Secondary | ICD-10-CM | POA: Diagnosis not present

## 2021-06-03 DIAGNOSIS — Z86010 Personal history of colonic polyps: Secondary | ICD-10-CM | POA: Diagnosis not present

## 2021-06-03 DIAGNOSIS — M810 Age-related osteoporosis without current pathological fracture: Secondary | ICD-10-CM | POA: Diagnosis not present

## 2021-06-03 DIAGNOSIS — M67441 Ganglion, right hand: Secondary | ICD-10-CM | POA: Diagnosis not present

## 2021-06-03 DIAGNOSIS — Z8679 Personal history of other diseases of the circulatory system: Secondary | ICD-10-CM | POA: Diagnosis not present

## 2021-06-03 DIAGNOSIS — E785 Hyperlipidemia, unspecified: Secondary | ICD-10-CM | POA: Diagnosis not present

## 2021-06-03 DIAGNOSIS — F320 Major depressive disorder, single episode, mild: Secondary | ICD-10-CM | POA: Diagnosis not present

## 2021-06-09 ENCOUNTER — Telehealth: Payer: Self-pay | Admitting: Physician Assistant

## 2021-06-09 NOTE — Telephone Encounter (Signed)
Nancy [name] from [facility] called for verbal orders for speech therapy.

## 2021-06-09 NOTE — Telephone Encounter (Signed)
[facility] was called and given verbal orders for Speech therapy and OT by Dr Delice Lesch, demographics faxed to [phone]

## 2021-06-13 DIAGNOSIS — M25542 Pain in joints of left hand: Secondary | ICD-10-CM | POA: Diagnosis not present

## 2021-06-13 DIAGNOSIS — I2510 Atherosclerotic heart disease of native coronary artery without angina pectoris: Secondary | ICD-10-CM | POA: Diagnosis not present

## 2021-06-13 DIAGNOSIS — R42 Dizziness and giddiness: Secondary | ICD-10-CM | POA: Diagnosis not present

## 2021-06-13 DIAGNOSIS — E785 Hyperlipidemia, unspecified: Secondary | ICD-10-CM | POA: Diagnosis not present

## 2021-06-13 DIAGNOSIS — G301 Alzheimer's disease with late onset: Secondary | ICD-10-CM | POA: Diagnosis not present

## 2021-06-13 DIAGNOSIS — Z9181 History of falling: Secondary | ICD-10-CM | POA: Diagnosis not present

## 2021-06-13 DIAGNOSIS — F0281 Dementia in other diseases classified elsewhere with behavioral disturbance: Secondary | ICD-10-CM | POA: Diagnosis not present

## 2021-06-13 DIAGNOSIS — M810 Age-related osteoporosis without current pathological fracture: Secondary | ICD-10-CM | POA: Diagnosis not present

## 2021-06-13 DIAGNOSIS — Z86010 Personal history of colonic polyps: Secondary | ICD-10-CM | POA: Diagnosis not present

## 2021-06-13 DIAGNOSIS — M25541 Pain in joints of right hand: Secondary | ICD-10-CM | POA: Diagnosis not present

## 2021-06-13 DIAGNOSIS — I10 Essential (primary) hypertension: Secondary | ICD-10-CM | POA: Diagnosis not present

## 2021-06-16 DIAGNOSIS — F0281 Dementia in other diseases classified elsewhere with behavioral disturbance: Secondary | ICD-10-CM | POA: Diagnosis not present

## 2021-06-16 DIAGNOSIS — I2510 Atherosclerotic heart disease of native coronary artery without angina pectoris: Secondary | ICD-10-CM | POA: Diagnosis not present

## 2021-06-16 DIAGNOSIS — R42 Dizziness and giddiness: Secondary | ICD-10-CM | POA: Diagnosis not present

## 2021-06-16 DIAGNOSIS — E785 Hyperlipidemia, unspecified: Secondary | ICD-10-CM | POA: Diagnosis not present

## 2021-06-16 DIAGNOSIS — I10 Essential (primary) hypertension: Secondary | ICD-10-CM | POA: Diagnosis not present

## 2021-06-16 DIAGNOSIS — G301 Alzheimer's disease with late onset: Secondary | ICD-10-CM | POA: Diagnosis not present

## 2021-06-18 ENCOUNTER — Telehealth: Payer: Self-pay

## 2021-06-18 NOTE — Telephone Encounter (Signed)
[place] with OT called voice mail left for her with verbal orders OT 1 time a week for 5 weeks

## 2021-06-19 DIAGNOSIS — F0281 Dementia in other diseases classified elsewhere with behavioral disturbance: Secondary | ICD-10-CM | POA: Diagnosis not present

## 2021-06-19 DIAGNOSIS — G301 Alzheimer's disease with late onset: Secondary | ICD-10-CM | POA: Diagnosis not present

## 2021-06-19 DIAGNOSIS — E785 Hyperlipidemia, unspecified: Secondary | ICD-10-CM | POA: Diagnosis not present

## 2021-06-19 DIAGNOSIS — I10 Essential (primary) hypertension: Secondary | ICD-10-CM | POA: Diagnosis not present

## 2021-06-19 DIAGNOSIS — R42 Dizziness and giddiness: Secondary | ICD-10-CM | POA: Diagnosis not present

## 2021-06-19 DIAGNOSIS — I2510 Atherosclerotic heart disease of native coronary artery without angina pectoris: Secondary | ICD-10-CM | POA: Diagnosis not present

## 2021-06-24 DIAGNOSIS — F0281 Dementia in other diseases classified elsewhere with behavioral disturbance: Secondary | ICD-10-CM | POA: Diagnosis not present

## 2021-06-24 DIAGNOSIS — I2510 Atherosclerotic heart disease of native coronary artery without angina pectoris: Secondary | ICD-10-CM | POA: Diagnosis not present

## 2021-06-24 DIAGNOSIS — R42 Dizziness and giddiness: Secondary | ICD-10-CM | POA: Diagnosis not present

## 2021-06-24 DIAGNOSIS — I10 Essential (primary) hypertension: Secondary | ICD-10-CM | POA: Diagnosis not present

## 2021-06-24 DIAGNOSIS — G301 Alzheimer's disease with late onset: Secondary | ICD-10-CM | POA: Diagnosis not present

## 2021-06-24 DIAGNOSIS — E785 Hyperlipidemia, unspecified: Secondary | ICD-10-CM | POA: Diagnosis not present

## 2021-06-26 DIAGNOSIS — E785 Hyperlipidemia, unspecified: Secondary | ICD-10-CM | POA: Diagnosis not present

## 2021-06-26 DIAGNOSIS — G301 Alzheimer's disease with late onset: Secondary | ICD-10-CM | POA: Diagnosis not present

## 2021-06-26 DIAGNOSIS — I10 Essential (primary) hypertension: Secondary | ICD-10-CM | POA: Diagnosis not present

## 2021-06-26 DIAGNOSIS — I2510 Atherosclerotic heart disease of native coronary artery without angina pectoris: Secondary | ICD-10-CM | POA: Diagnosis not present

## 2021-06-26 DIAGNOSIS — R42 Dizziness and giddiness: Secondary | ICD-10-CM | POA: Diagnosis not present

## 2021-06-26 DIAGNOSIS — F0281 Dementia in other diseases classified elsewhere with behavioral disturbance: Secondary | ICD-10-CM | POA: Diagnosis not present

## 2021-06-27 DIAGNOSIS — I2510 Atherosclerotic heart disease of native coronary artery without angina pectoris: Secondary | ICD-10-CM | POA: Diagnosis not present

## 2021-06-27 DIAGNOSIS — F0281 Dementia in other diseases classified elsewhere with behavioral disturbance: Secondary | ICD-10-CM | POA: Diagnosis not present

## 2021-06-27 DIAGNOSIS — G301 Alzheimer's disease with late onset: Secondary | ICD-10-CM | POA: Diagnosis not present

## 2021-06-27 DIAGNOSIS — E785 Hyperlipidemia, unspecified: Secondary | ICD-10-CM | POA: Diagnosis not present

## 2021-06-27 DIAGNOSIS — I10 Essential (primary) hypertension: Secondary | ICD-10-CM | POA: Diagnosis not present

## 2021-06-27 DIAGNOSIS — R42 Dizziness and giddiness: Secondary | ICD-10-CM | POA: Diagnosis not present

## 2021-07-01 DIAGNOSIS — G301 Alzheimer's disease with late onset: Secondary | ICD-10-CM | POA: Diagnosis not present

## 2021-07-01 DIAGNOSIS — E785 Hyperlipidemia, unspecified: Secondary | ICD-10-CM | POA: Diagnosis not present

## 2021-07-01 DIAGNOSIS — R42 Dizziness and giddiness: Secondary | ICD-10-CM | POA: Diagnosis not present

## 2021-07-01 DIAGNOSIS — I2510 Atherosclerotic heart disease of native coronary artery without angina pectoris: Secondary | ICD-10-CM | POA: Diagnosis not present

## 2021-07-01 DIAGNOSIS — F0281 Dementia in other diseases classified elsewhere with behavioral disturbance: Secondary | ICD-10-CM | POA: Diagnosis not present

## 2021-07-01 DIAGNOSIS — I10 Essential (primary) hypertension: Secondary | ICD-10-CM | POA: Diagnosis not present

## 2021-07-02 DIAGNOSIS — E785 Hyperlipidemia, unspecified: Secondary | ICD-10-CM | POA: Diagnosis not present

## 2021-07-02 DIAGNOSIS — I10 Essential (primary) hypertension: Secondary | ICD-10-CM | POA: Diagnosis not present

## 2021-07-02 DIAGNOSIS — G301 Alzheimer's disease with late onset: Secondary | ICD-10-CM | POA: Diagnosis not present

## 2021-07-02 DIAGNOSIS — I2510 Atherosclerotic heart disease of native coronary artery without angina pectoris: Secondary | ICD-10-CM | POA: Diagnosis not present

## 2021-07-02 DIAGNOSIS — F0281 Dementia in other diseases classified elsewhere with behavioral disturbance: Secondary | ICD-10-CM | POA: Diagnosis not present

## 2021-07-02 DIAGNOSIS — R42 Dizziness and giddiness: Secondary | ICD-10-CM | POA: Diagnosis not present

## 2021-07-03 DIAGNOSIS — F0281 Dementia in other diseases classified elsewhere with behavioral disturbance: Secondary | ICD-10-CM | POA: Diagnosis not present

## 2021-07-03 DIAGNOSIS — R42 Dizziness and giddiness: Secondary | ICD-10-CM | POA: Diagnosis not present

## 2021-07-03 DIAGNOSIS — G301 Alzheimer's disease with late onset: Secondary | ICD-10-CM | POA: Diagnosis not present

## 2021-07-03 DIAGNOSIS — E785 Hyperlipidemia, unspecified: Secondary | ICD-10-CM | POA: Diagnosis not present

## 2021-07-03 DIAGNOSIS — I10 Essential (primary) hypertension: Secondary | ICD-10-CM | POA: Diagnosis not present

## 2021-07-03 DIAGNOSIS — I2510 Atherosclerotic heart disease of native coronary artery without angina pectoris: Secondary | ICD-10-CM | POA: Diagnosis not present

## 2021-07-10 DIAGNOSIS — I10 Essential (primary) hypertension: Secondary | ICD-10-CM | POA: Diagnosis not present

## 2021-07-10 DIAGNOSIS — E785 Hyperlipidemia, unspecified: Secondary | ICD-10-CM | POA: Diagnosis not present

## 2021-07-10 DIAGNOSIS — F0281 Dementia in other diseases classified elsewhere with behavioral disturbance: Secondary | ICD-10-CM | POA: Diagnosis not present

## 2021-07-10 DIAGNOSIS — I2510 Atherosclerotic heart disease of native coronary artery without angina pectoris: Secondary | ICD-10-CM | POA: Diagnosis not present

## 2021-07-10 DIAGNOSIS — G301 Alzheimer's disease with late onset: Secondary | ICD-10-CM | POA: Diagnosis not present

## 2021-07-10 DIAGNOSIS — R42 Dizziness and giddiness: Secondary | ICD-10-CM | POA: Diagnosis not present

## 2021-07-13 DIAGNOSIS — G301 Alzheimer's disease with late onset: Secondary | ICD-10-CM | POA: Diagnosis not present

## 2021-07-13 DIAGNOSIS — R42 Dizziness and giddiness: Secondary | ICD-10-CM | POA: Diagnosis not present

## 2021-07-13 DIAGNOSIS — Z86010 Personal history of colonic polyps: Secondary | ICD-10-CM | POA: Diagnosis not present

## 2021-07-13 DIAGNOSIS — I10 Essential (primary) hypertension: Secondary | ICD-10-CM | POA: Diagnosis not present

## 2021-07-13 DIAGNOSIS — M25541 Pain in joints of right hand: Secondary | ICD-10-CM | POA: Diagnosis not present

## 2021-07-13 DIAGNOSIS — F0281 Dementia in other diseases classified elsewhere with behavioral disturbance: Secondary | ICD-10-CM | POA: Diagnosis not present

## 2021-07-13 DIAGNOSIS — Z9181 History of falling: Secondary | ICD-10-CM | POA: Diagnosis not present

## 2021-07-13 DIAGNOSIS — E785 Hyperlipidemia, unspecified: Secondary | ICD-10-CM | POA: Diagnosis not present

## 2021-07-13 DIAGNOSIS — I2510 Atherosclerotic heart disease of native coronary artery without angina pectoris: Secondary | ICD-10-CM | POA: Diagnosis not present

## 2021-07-13 DIAGNOSIS — M810 Age-related osteoporosis without current pathological fracture: Secondary | ICD-10-CM | POA: Diagnosis not present

## 2021-07-13 DIAGNOSIS — M25542 Pain in joints of left hand: Secondary | ICD-10-CM | POA: Diagnosis not present

## 2021-07-15 ENCOUNTER — Other Ambulatory Visit (HOSPITAL_BASED_OUTPATIENT_CLINIC_OR_DEPARTMENT_OTHER): Payer: Self-pay

## 2021-07-15 ENCOUNTER — Ambulatory Visit: Payer: Medicare Other | Attending: Internal Medicine

## 2021-07-15 DIAGNOSIS — Z23 Encounter for immunization: Secondary | ICD-10-CM

## 2021-07-15 MED ORDER — PFIZER COVID-19 VAC BIVALENT 30 MCG/0.3ML IM SUSP
INTRAMUSCULAR | 0 refills | Status: DC
  Filled 2021-07-15: qty 0.3, 1d supply, fill #0

## 2021-07-15 NOTE — Progress Notes (Signed)
   Covid-19 Vaccination Clinic  Name:  Nancy [name]    MRN: [id number] DOB: [birth year]  07/15/2021  [name] was observed post Covid-19 immunization for 15 minutes without incident. She was provided with Vaccine Information Sheet and instruction to access the V-Safe system.   [name] was instructed to call 911 with any severe reactions post vaccine: Difficulty breathing  Swelling of face and throat  A fast heartbeat  A bad rash all over body  Dizziness and weakness

## 2021-07-16 DIAGNOSIS — I10 Essential (primary) hypertension: Secondary | ICD-10-CM | POA: Diagnosis not present

## 2021-07-16 DIAGNOSIS — R42 Dizziness and giddiness: Secondary | ICD-10-CM | POA: Diagnosis not present

## 2021-07-16 DIAGNOSIS — E785 Hyperlipidemia, unspecified: Secondary | ICD-10-CM | POA: Diagnosis not present

## 2021-07-16 DIAGNOSIS — G301 Alzheimer's disease with late onset: Secondary | ICD-10-CM | POA: Diagnosis not present

## 2021-07-16 DIAGNOSIS — F0281 Dementia in other diseases classified elsewhere with behavioral disturbance: Secondary | ICD-10-CM | POA: Diagnosis not present

## 2021-07-16 DIAGNOSIS — I2510 Atherosclerotic heart disease of native coronary artery without angina pectoris: Secondary | ICD-10-CM | POA: Diagnosis not present

## 2021-08-28 ENCOUNTER — Encounter: Payer: Self-pay | Admitting: Neurology

## 2021-10-26 ENCOUNTER — Encounter (HOSPITAL_BASED_OUTPATIENT_CLINIC_OR_DEPARTMENT_OTHER): Payer: Self-pay | Admitting: Emergency Medicine

## 2021-10-26 ENCOUNTER — Emergency Department (HOSPITAL_BASED_OUTPATIENT_CLINIC_OR_DEPARTMENT_OTHER)
Admission: EM | Admit: 2021-10-26 | Discharge: 2021-10-26 | Disposition: A | Discharge status: Home / Self Care | Payer: Medicare Other | Attending: Emergency Medicine | Admitting: Emergency Medicine

## 2021-10-26 ENCOUNTER — Other Ambulatory Visit: Payer: Self-pay

## 2021-10-26 DIAGNOSIS — X58XXXA Exposure to other specified factors, initial encounter: Secondary | ICD-10-CM | POA: Insufficient documentation

## 2021-10-26 DIAGNOSIS — Z7982 Long term (current) use of aspirin: Secondary | ICD-10-CM | POA: Insufficient documentation

## 2021-10-26 DIAGNOSIS — S0502XA Injury of conjunctiva and corneal abrasion without foreign body, left eye, initial encounter: Secondary | ICD-10-CM | POA: Insufficient documentation

## 2021-10-26 DIAGNOSIS — H5712 Ocular pain, left eye: Secondary | ICD-10-CM | POA: Diagnosis present

## 2021-10-26 MED ORDER — TETRACAINE HCL 0.5 % OP SOLN
1.0000 [drp] | Freq: Once | OPHTHALMIC | Status: AC
  Administered 2021-10-26: 1 [drp] via OPHTHALMIC
  Filled 2021-10-26: qty 4

## 2021-10-26 MED ORDER — CIPROFLOXACIN HCL 0.3 % OP SOLN
2.0000 [drp] | OPHTHALMIC | Status: DC
  Administered 2021-10-26: 2 [drp] via OPHTHALMIC
  Filled 2021-10-26 (×2): qty 2.5

## 2021-10-26 MED ORDER — FLUORESCEIN SODIUM 1 MG OP STRP
1.0000 | ORAL_STRIP | Freq: Once | OPHTHALMIC | Status: AC
  Administered 2021-10-26: 1 via OPHTHALMIC
  Filled 2021-10-26: qty 1

## 2021-10-26 NOTE — ED Notes (Signed)
Pt arrived from [facility].

## 2021-10-26 NOTE — ED Provider Notes (Signed)
Nancy [name]   CSN: [id number] Arrival date & time: 10/26/21  1648     History  Chief Complaint  Patient presents with   Eye Pain    Nancy [name] is a 76 y.o. female.  HPI 76 year old female presents today complaining of left eye pain.  She states she scratched her eye earlier today and is having pain in the side.  She has not noted any discharge, or vision changes.  She has not any other trauma.     Home Medications Prior to Admission medications   Medication Sig Start Date End Date Taking? Authorizing Provider  aspirin 81 MG tablet Take 81 mg by mouth daily.    [provider]  COVID-19 mRNA bivalent vaccine, Pfizer, (PFIZER COVID-19 VAC BIVALENT) injection Inject into the muscle. 07/15/21   Carlyle Basques, MD  cyanocobalamin 1000 MCG tablet Take 1,000 mcg by mouth daily.    [provider]  donepezil (ARICEPT) 10 MG tablet Take 1 tablet by mouth once daily 05/23/21   Cameron Sprang, MD  escitalopram (LEXAPRO) 20 MG tablet Take 1 tablet (20 mg total) by mouth daily. 05/23/21   Cameron Sprang, MD  ezetimibe (ZETIA) 10 MG tablet Take 1 tablet (10 mg total) by mouth daily. 01/13/21   Nahser, Wonda Cheng, MD  memantine (NAMENDA) 10 MG tablet Take 1 tablet every night for 2 weeks, then increase to 1 tablet twice a day 05/23/21   Cameron Sprang, MD  metoprolol succinate (TOPROL-XL) 25 MG 24 hr tablet Take 1 tablet (25 mg total) by mouth daily. 01/13/21   Nahser, Wonda Cheng, MD  nitroGLYCERIN (NITROSTAT) 0.4 MG SL tablet Place 1 tablet (0.4 mg total) under the tongue every 5 (five) minutes as needed for chest pain. Please make overdue appt with Dr. Acie Fredrickson before anymore refills. Thank you 1st attempt 11/20/20   Nahser, Wonda Cheng, MD  Omega-3 Fatty Acids (FISH OIL PO) Take by mouth.    [provider]  traZODone (DESYREL) 100 MG tablet Take 1 tablet (100 mg total) by mouth at bedtime as needed for sleep. 06/18/20   Susy Frizzle, MD  VITAMIN D PO Take by mouth.    [provider]  VITAMIN E PO Take by mouth.    [provider]      Allergies    Alendronate, Atorvastatin, Bactrim [sulfamethoxazole-trimethoprim], Buprenorphine hcl, Morphine, Morphine and related, Prednisone, Sulfa antibiotics, Sulfasalazine, and Sulfonamide derivatives    Review of Systems   Review of Systems  All other systems reviewed and are negative.  Physical Exam Updated Vital Signs BP (!) 166/75    Pulse 61    Temp 98.7 F (37.1 C)    Resp 20    SpO2 100%  Physical Exam Vitals reviewed.  Constitutional:      Appearance: Normal appearance.  HENT:     Head: Normocephalic.     Right Ear: External ear normal.     Left Ear: External ear normal.     Nose: Nose normal.     Mouth/Throat:     Pharynx: Oropharynx is clear.  Eyes:     General: Lids are normal. Lids are everted, no foreign bodies appreciated. Vision grossly intact. Gaze aligned appropriately.        Right eye: Right eye discharge: 18.        Left eye: No foreign body or discharge.     Intraocular pressure: Right eye pressure is 18 mmHg. Left eye  pressure is 17 mmHg.     Extraocular Movements: Extraocular movements intact.     Conjunctiva/sclera: Conjunctivae normal.     Pupils: Pupils are equal, round, and reactive to light.     Slit lamp exam:    Left eye: Anterior chamber quiet.     Comments: Small corneal abrasion noted  Neurological:     Mental Status: She is alert.    ED Results / Procedures / Treatments   Labs (all labs ordered are listed, but only abnormal results are displayed) Labs Reviewed - No data to display  EKG None  Radiology No results found.  Procedures Procedures    Medications Ordered in ED Medications  ciprofloxacin (CILOXAN) 0.3 % ophthalmic solution 2 drop (has no administration in time range)  fluorescein ophthalmic strip 1 strip (1 strip Left Eye Given 10/26/21 1745)  tetracaine (PONTOCAINE) 0.5 % ophthalmic  solution 1 drop (1 drop Left Eye Given 10/26/21 1804)    ED Course/ Medical Decision Making/ A&P                           Medical Decision Making         Final Clinical Impression(s) / ED Diagnoses Final diagnoses:  Abrasion of left cornea, initial encounter    Rx / DC Orders ED Discharge Orders     None         Pattricia Boss, MD 10/26/21 [phone]

## 2021-10-26 NOTE — ED Triage Notes (Signed)
Pt was scratching her left eye earlier and then started having pain in the eye.

## 2021-10-26 NOTE — Discharge Instructions (Addendum)
Continue Cipro drops every 2 hours while awake for the next 3 days Recheck with her eye doctor in the next 1 to 2 days if not improved or worsening at any time

## 2021-10-26 NOTE — ED Triage Notes (Signed)
Pt arrived from [facility].

## 2021-10-26 NOTE — ED Notes (Signed)
Dc instructions reviewed with patient. Patient voiced understanding. Dc with belongings.  °

## 2021-11-25 ENCOUNTER — Other Ambulatory Visit: Payer: Self-pay

## 2021-11-25 ENCOUNTER — Encounter: Payer: Self-pay | Admitting: Physician Assistant

## 2021-11-25 ENCOUNTER — Ambulatory Visit (INDEPENDENT_AMBULATORY_CARE_PROVIDER_SITE_OTHER): Payer: Medicare Other | Admitting: Physician Assistant

## 2021-11-25 VITALS — BP 133/56 | HR 66 | Resp 20 | Ht 65.0 in | Wt 137.0 lb

## 2021-11-25 DIAGNOSIS — F02818 Dementia in other diseases classified elsewhere, unspecified severity, with other behavioral disturbance: Secondary | ICD-10-CM | POA: Diagnosis not present

## 2021-11-25 DIAGNOSIS — G301 Alzheimer's disease with late onset: Secondary | ICD-10-CM | POA: Diagnosis not present

## 2021-11-25 NOTE — Patient Instructions (Addendum)
Continue Memantine 10mg  1 tablet twice a day  2. Continue Donepezil 10mg  daily  3. Continue Lexapro 20mg  daily   4. Follow-up in 6 months, call for any changes   FALL PRECAUTIONS: Be cautious when walking. Scan the area for obstacles that may increase the risk of trips and falls. When getting up in the mornings, sit up at the edge of the bed for a few minutes before getting out of bed. Consider elevating the bed at the head end to avoid drop of blood pressure when getting up. Walk always in a well-lit room (use night lights in the walls). Avoid area rugs or power cords from appliances in the middle of the walkways. Use a walker or a cane if necessary and consider physical therapy for balance exercise. Get your eyesight checked regularly.  HOME SAFETY: Consider the safety of the kitchen when operating appliances like stoves, microwave oven, and blender. Consider having supervision and share cooking responsibilities until no longer able to participate in those. Accidents with firearms and other hazards in the house should be identified and addressed as well.  DRIVING: Regarding driving, in patients with progressive memory problems, driving will be impaired. We advise to have someone else do the driving if trouble finding directions or if minor accidents are reported. Independent driving assessment is available to determine safety of driving.  ABILITY TO BE LEFT ALONE: If patient is unable to contact 911 operator, consider using LifeLine, or when the need is there, arrange for someone to stay with patients. Smoking is a fire hazard, consider supervision or cessation. Risk of wandering should be assessed by caregiver and if detected at any point, supervision and safe proof recommendations should be instituted.  MEDICATION SUPERVISION: Inability to self-administer medication needs to be constantly addressed. Implement a mechanism to ensure safe administration of the medications.  RECOMMENDATIONS FOR  ALL PATIENTS WITH MEMORY PROBLEMS: 1. Continue to exercise (Recommend 30 minutes of walking everyday, or 3 hours every week) 2. Increase social interactions - continue going to [place] and enjoy social gatherings with friends and family 3. Eat healthy, avoid fried foods and eat more fruits and vegetables 4. Maintain adequate blood pressure, blood sugar, and blood cholesterol level. Reducing the risk of stroke and cardiovascular disease also helps promoting better memory. 5. Avoid stressful situations. Live a simple life and avoid aggravations. Organize your time and prepare for the next day in anticipation. 6. Sleep well, avoid any interruptions of sleep and avoid any distractions in the bedroom that may interfere with adequate sleep quality 7. Avoid sugar, avoid sweets as there is a strong link between excessive sugar intake, diabetes, and cognitive impairment We discussed the Mediterranean diet, which has been shown to help patients reduce the risk of progressive memory disorders and reduces cardiovascular risk. This includes eating fish, eat fruits and green leafy vegetables, nuts like almonds and hazelnuts, walnuts, and also use olive oil. Avoid fast foods and fried foods as much as possible. Avoid sweets and sugar as sugar use has been linked to worsening of memory function.  There is always a concern of gradual progression of memory problems. If this is the case, then we may need to adjust level of care according to patient needs. Support, both to the patient and caregiver, should then be put into place.

## 2021-11-25 NOTE — Progress Notes (Signed)
Assessment/Plan:   Late onset Alzheimer's dementia without behavioral disturbance    MMSE today is 13/30, with some progression of the disease.  Patient however, is able to perform her ADLs, and she is able to answer questions appropriately.  She is on donepezil 10 mg daily, and memantine 10 mg twice daily.  Her mood is controlled with Lexapro 20 mg daily.   Recommendations:  Discussed safety both in and out of the home.  Discussed the importance of regular daily schedule with inclusion of crossword puzzles to maintain brain function.  Continue to monitor mood with Lexapro 20 mg daily, side effects discussed Continue cognitive behavioral therapy increase her [place], monitor sleep. Stay active at least 30 minutes at least 3 times a week.  Naps should be scheduled and should be no longer than 60 minutes and should not occur after 2 PM.  Mediterranean diet is recommended  Control cardiovascular risk factors  Continue donepezil 10 mg daily Side effects were discussed Continue memantine 10 mg twice daily, side effects discussed Follow up in 6  months.   Case discussed with Dr. Delice Lesch who agrees with the plan    Subjective:    Nancy [name] is a very pleasant 76 y.o. 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  Her last visit to our office was on 05/23/2021.  She is again accompanied by her daughter who supplements the history today.  Records and images were personally reviewed where available.  She is on donepezil 10 mg daily, and memantine 10 mg twice daily, tolerating well.  For mood, she is on Lexapro 20 mg daily. Since her last visit, the patient reports that her memory may be "a little bit worse, but not much ".  She may not be able to recall recent conversations, or may ask the  same question at times.  She denies leaving objects in unusual places.  Has been enjoying living at [facility].  She admits to not being very active, but likes to "sit with the ladies and talk ".  She no longer drives, and her daughter is in charge of the finances.  Her daughter fills the pillbox, and the report by the staff is that she is good with taking them, only forgetting it when she is out of her routine.  She does not cook, "I do not have to, I can go to the dining hall ".  There are no hygiene concerns, she still independent with dressing and bathing.  Sleeps is sometimes interrupted, she wakes up around 2 or 3 AM, and does not like to take naps.  At times, she does not feel rested, becomes tired in the morning and throughout the day.  She is no longer taking trazodone, because it was "making her too sleepy ".  "she has the clock in reverse, working with Dr. Pablo Ledger regarding this issue "-daughter says.  She attends the [facility] in [facility].  Her mood is "pretty good ", her stress "has gotten better ".  She denies depression.  Occasionally she may have heard someone, a voice, but it is unclear if this is actual hallucination.  She may be more paranoid however, does not like when someone comes to clean, or if anything changes she becomes more irritable, for example if the thermostat is changed in temperature,  she will blame it to the staff.  She denies any headaches, dizziness, focal numbness, tingling, weakness or falls.  No urinary or bowel complaints.  Currently, no vision complaints, but she did sustain a corneal abrasion in the left eye in January 2023.  History on Initial Assessment 04/06/2018: This is a pleasant 76 year old ambidextrous woman with a history of right chronic subdural hematoma s/p craniectomy in 2013, hyperlipidemia, hypertension, CAD, presenting for evaluation of worsening memory. She and her husband reports memory changes started  soon after her brain surgery in 2013, but have become more progressive and significantly noticeable in the past year. Soon after surgery, her husband noticed that she would have word-finding difficulties, mixing up [place] and [place]. Over the past year, the word-finding difficulties have worsened, she states she sometimes forgets her grandparents names, when she meant to say grandchildren. She got a shock around 1-1.5 years ago at the doctor's office when she could not remember her address and had to look at her drivers license. She loses things a lot at home, she has 9 pairs of reading glasses and always has a hard time finding them. She does not cook as much and denies leaving the stove on. She continues to manage bills and medications without significant difficulties, she occasionally forgets her medications when they are busy. She stopped driving after heart surgery years ago, she denied getting lost but felt afraid. She is able to bathe and dress independently. Over the past year, she has become quite anxious about minor things, sometimes the dog barking would make her very nervous. She has found taking [place] helps with this. She has a prescription for Xanax but does not take it regularly. She was noted to be anxious at several points during the visit today.   She denies any regular headaches but has a light one today from running around. She has light dizziness when standing at times. She denies any diplopia, dysarthria/dysphagia, focal numbness/tingling/weakness, both feet are numb. She has low back pain and occasional neck pain. She has constipation. No anosmia, tremors, REM behavior disorder. Her husband denies any paranoia or hallucinations. She does not sleep well, no wandering behavior. Her mother and maternal grandmother had memory issues. Aside from head injury in 2013 (hit her head getting up, leading to subdural hematoma), no other head injuries or falls. She rarely drinks alcohol.    She had an MRI brain with and without contrast in July 2019 which I personally reviewed, no acute changes, there was mild diffuse volume loss, minimal chronic microvascular disease.     PREVIOUS MEDICATIONS:   CURRENT MEDICATIONS:  Outpatient Encounter Medications as of 11/25/2021  Medication Sig   aspirin 81 MG tablet Take 81 mg by mouth daily.   COVID-19 mRNA bivalent vaccine, Pfizer, (PFIZER COVID-19 VAC BIVALENT) injection Inject into the muscle.   cyanocobalamin 1000 MCG tablet Take 1,000 mcg by mouth daily.   donepezil (ARICEPT) 10 MG tablet Take 1 tablet by mouth once daily   escitalopram (LEXAPRO) 20 MG tablet Take 1 tablet (20 mg total) by mouth daily.   ezetimibe (ZETIA) 10 MG tablet Take 1 tablet (10 mg total) by mouth daily.   memantine (NAMENDA) 10 MG tablet Take 1 tablet every night for 2 weeks, then increase to 1 tablet twice a day   metoprolol succinate (TOPROL-XL) 25 MG 24 hr tablet Take 1 tablet (25 mg total) by mouth daily.   nitroGLYCERIN (NITROSTAT) 0.4 MG SL tablet Place 1 tablet (0.4  mg total) under the tongue every 5 (five) minutes as needed for chest pain. Please make overdue appt with Dr. Acie Fredrickson before anymore refills. Thank you 1st attempt   Omega-3 Fatty Acids (FISH OIL PO) Take by mouth.   traZODone (DESYREL) 100 MG tablet Take 1 tablet (100 mg total) by mouth at bedtime as needed for sleep.   VITAMIN D PO Take by mouth.   VITAMIN E PO Take by mouth.   No facility-administered encounter medications on file as of 11/25/2021.     Objective:     PHYSICAL EXAMINATION:    VITALS:   Vitals:   11/25/21 1059  BP: (!) 133/56  Pulse: 66  Resp: 20  SpO2: 95%  Weight: 137 lb (62.1 kg)  Height: 5\' 5"  (1.651 m)    GEN:  The patient appears stated age and is in NAD. HEENT:  Normocephalic, atraumatic.   Neurological examination:  General: NAD, well-groomed, appears stated age. Orientation: The patient is alert. Oriented to person, not to place or date.   Unable to spell world backwards, or copy design or clock.  She is able to write a good sentence in both [place] and [place]. Cranial nerves: There is good facial symmetry.The speech is fluent and clear. No aphasia or dysarthria. Fund of knowledge is appropriate. Recent and remote memory are impaired. Attention and concentration are reduced.  Able to name objects and repeat phrases.  Hearing is intact to conversational tone.    Sensation: Sensation is intact to light touch throughout Motor: Strength is at least antigravity x4. Tremors: none  DTR's 2/4 in [facility]  06/25/2020 06/19/2019 11/18/2018 04/06/2018  Visuospatial/ Executive (0/5) 2 3 4 5   Naming (0/3) 3 3 3 3   Attention: Read list of digits (0/2) 2 2 2 2   Attention: Read list of letters (0/1) 1 1 1 1   Attention: Serial 7 subtraction starting at 100 (0/3) 0 2 3 3   Language: Repeat phrase (0/2) 1 1 0 2  Language : Fluency (0/1) 0 0 0 0  Abstraction (0/2) 0 2 0 2  Delayed Recall (0/5) 0 3 4 1   Orientation (0/6) 4 6 5 6   Total 13 23 22 25   Adjusted Score (based on education) 14 24 23  -   MMSE - Mini Mental State Exam 11/25/2021  Orientation to time 0  Orientation to Place 1  Registration 3  Attention/ Calculation 0  Recall 2  Language- name 2 objects 2  Language- repeat 1  Language- follow 3 step command 2  Language- read & follow direction 1  Write a sentence 1  Copy design 0  Total score 13    No flowsheet data found.     Movement examination: Tone: There is normal tone in the UE/LE Abnormal movements:  no tremor.  No myoclonus.  No asterixis.   Coordination:  There is no decremation with RAM's. Normal finger to nose  Gait and Station: The patient has no difficulty arising out of a deep-seated chair without the use of the hands. The patient's stride length is good.  Gait is cautious and narrow.        Total time spent on today's visit was minutes, including both face-to-face time and  nonface-to-face time. Time included that spent on review of records (prior notes available to me/labs/imaging if pertinent), discussing treatment and goals, answering patient's questions and coordinating care.  Cc:  [place], [place]. Sharene Butters, PA-C

## 2021-12-02 ENCOUNTER — Telehealth: Payer: Self-pay | Admitting: Cardiovascular Disease

## 2021-12-02 DIAGNOSIS — I25118 Atherosclerotic heart disease of native coronary artery with other forms of angina pectoris: Secondary | ICD-10-CM

## 2021-12-02 DIAGNOSIS — I2510 Atherosclerotic heart disease of native coronary artery without angina pectoris: Secondary | ICD-10-CM

## 2021-12-02 DIAGNOSIS — E785 Hyperlipidemia, unspecified: Secondary | ICD-10-CM

## 2021-12-02 DIAGNOSIS — Z79899 Other long term (current) drug therapy: Secondary | ICD-10-CM

## 2021-12-02 DIAGNOSIS — E7800 Pure hypercholesterolemia, unspecified: Secondary | ICD-10-CM

## 2021-12-02 NOTE — Telephone Encounter (Signed)
Daughter called in to schedule patient's one year follow up. Appt notes are requesting labs, but there is no active order in the system. Daughter is requesting callback to schedule once orders are placed.

## 2021-12-02 NOTE — Telephone Encounter (Signed)
Per Dr. Elmarie Shiley last OV note on the pt from a year ago, he wanted her to have her labs checked at her next yearly visit, to check lipids, ALT, and BMET.   Orders placed. Spoke with the pt and daughter and was able to schedule the pt a lab appt a week prior to seeing Dr. Acie Fredrickson in clinic, for 01/13/22.  Both parties aware to have the pt come fasting to this lab appt.  Both verbalized understanding and agrees with this plan.

## 2022-01-13 ENCOUNTER — Other Ambulatory Visit: Payer: Medicare Other | Admitting: *Deleted

## 2022-01-13 ENCOUNTER — Other Ambulatory Visit: Payer: Self-pay

## 2022-01-13 DIAGNOSIS — I25118 Atherosclerotic heart disease of native coronary artery with other forms of angina pectoris: Secondary | ICD-10-CM

## 2022-01-13 DIAGNOSIS — Z79899 Other long term (current) drug therapy: Secondary | ICD-10-CM

## 2022-01-13 DIAGNOSIS — I2510 Atherosclerotic heart disease of native coronary artery without angina pectoris: Secondary | ICD-10-CM

## 2022-01-13 DIAGNOSIS — E7800 Pure hypercholesterolemia, unspecified: Secondary | ICD-10-CM

## 2022-01-13 DIAGNOSIS — E785 Hyperlipidemia, unspecified: Secondary | ICD-10-CM

## 2022-01-13 LAB — LIPID PANEL
Chol/HDL Ratio: 3.1 ratio (ref 0.0–4.4)
Cholesterol, Total: 198 mg/dL (ref 100–199)
HDL: 63 mg/dL (ref >39)
LDL Chol Calc (NIH): 117 mg/dL — ABNORMAL HIGH (ref 0–99)
Triglycerides: 100 mg/dL (ref 0–149)
VLDL Cholesterol Cal: 18 mg/dL (ref 5–40)

## 2022-01-13 LAB — BASIC METABOLIC PANEL
BUN/Creatinine Ratio: 17 (ref 12–28)
BUN: 21 mg/dL (ref 8–27)
CO2: 27 mmol/L (ref 20–29)
Calcium: 9.6 mg/dL (ref 8.7–10.3)
Chloride: 106 mmol/L (ref 96–106)
Creatinine, Ser: 1.25 mg/dL — ABNORMAL HIGH (ref 0.57–1.00)
Glucose: 90 mg/dL (ref 70–99)
Potassium: 4.7 mmol/L (ref 3.5–5.2)
Sodium: 145 mmol/L — ABNORMAL HIGH (ref 134–144)
eGFR: 45 mL/min/{1.73_m2} — ABNORMAL LOW (ref >59)

## 2022-01-13 LAB — ALT: ALT: 17 IU/L (ref 0–32)

## 2022-01-21 ENCOUNTER — Ambulatory Visit (INDEPENDENT_AMBULATORY_CARE_PROVIDER_SITE_OTHER): Payer: Medicare Other | Admitting: Cardiovascular Disease

## 2022-01-21 ENCOUNTER — Encounter: Payer: Self-pay | Admitting: Cardiovascular Disease

## 2022-01-21 VITALS — BP 132/64 | HR 58 | Ht 64.0 in | Wt 139.8 lb

## 2022-01-21 DIAGNOSIS — I2510 Atherosclerotic heart disease of native coronary artery without angina pectoris: Secondary | ICD-10-CM | POA: Diagnosis not present

## 2022-01-21 DIAGNOSIS — E785 Hyperlipidemia, unspecified: Secondary | ICD-10-CM

## 2022-01-21 NOTE — Patient Instructions (Signed)
Medication Instructions:  ?Your physician recommends that you continue on your current medications as directed. Please refer to the Current Medication list given to you today. ? ?*If you need a refill on your cardiac medications before your next appointment, please call your pharmacy* ? ?Lab Work: ?NONE ?If you have labs (blood work) drawn today and your tests are completely normal, you will receive your results only by: ?MyChart Message (if you have MyChart) OR ?A paper copy in the mail ?If you have any lab test that is abnormal or we need to change your treatment, we will call you to review the results. ? ?Testing/Procedures: ?NONE ? ?Follow-Up: ?At [facility], you and your health needs are our priority.  As part of our continuing mission to provide you with exceptional heart care, we have created designated Provider Care Teams.  These Care Teams include your primary Cardiologist (physician) and Advanced Practice Providers (APPs -  Physician Assistants and Nurse Practitioners) who all work together to provide you with the care you need, when you need it. ? ?Your next appointment:   ?1 year(s) ? ?The format for your next appointment:   ?In Person ? ?Provider:   ?Mertie Moores, MD  or Robbie Lis, PA-C or Richardson Dopp, [place] ? ?Other Instructions ?Your physician has referred you to our [facility] for consideration of PCSK9i or Inclisiran. They will call you to schedule an appointment.  ?

## 2022-01-21 NOTE — Progress Notes (Signed)
? ?Cardiology Office Note ? ? ?Date:  01/21/2022  ? ?ID:  Nancy [name], DOB [birth year], MRN [id number] ? ?PCP:  [place], [place].  ?Cardiologist:   Mertie Moores, MD  ? ?Chief Complaint  ?Patient presents with  ? Coronary Artery Disease  ? Hyperlipidemia  ?   ?  ? ?1. Coronary artery disease-status post coronary artery bypass graft- 2008 ?            Status post PCI in 2010. ?2. craniotomy for subdural hematoma. ?3.  .  hyperlipidemia-  ? ? ?Nancy [name] is doing well from a cardiac standpoint. ? ?She is having lots of leg cramps.  Takes Tylenol PM each night for cramps.  She's tried taking mustard and this does not help.  Her atorvastatin dose has been cut in 1/2 but she continues to have joint pain, muscle ache.  She also has numbness in her feet,   ?Has lots of ankle, knee and hip pain  ?  ?January 03, 2015: ?  ?Nancy [name] is a 76 y.o. female who presents for follow-up of her coronary artery disease. ? ?Sept. 13, 2016: ? ?Doing well.  ?Normal stress myoview in March , 2016, ?Lipids looked great  ?Feeling well.  ?Not sleeping well.  ? ?January 07, 2106: ? ?Has had some palpitations when she lies on her left side to sleep  ?Has worn the monitor in the past ?Does not want to necessarily wear it again  ?Has bilateral hand splints on - arthritis   ?No CP or dyspnea. ?Has slowed down with her walking  - walking slower and perhaps not as far.  ? ?Sept. 18, 2017 ? ?Has occasional irreg HR when she lays down on her left side.  ? ?Able to do her normal activities without cardiac problems . ?No CP or dyspnea . ?Lots of arthritis symptoms . ?Has some memory loss following her subdural hematoma ? ?January 04, 2017: ? ?Multiple concerns / complaints today  ? ?Has lots of muscle pains  , joint pain , ?Arthritis  ?Brief Dizziness when she stands up  ?Constipation ?Fatigues easily  ? ? ?No angina , ?Very rare episodes of chest tightness.  ?Has stopped some of her OTC meds ( fish oil, Vit B6, )  ? ?August 26, 2018: ? ?Nancy [name] is having fatigue  - more fatigue and shortness of breath with exertion  ?Very similar to prior to her CABG  ?Has occasional palpitations  ? ?April 11, 2018:  ? ?Nancy [name] is seen today for follow up of coronary artery disease. ?She was having some shortness of breath symptoms were very similar to her previous episodes of angina. ?Myoview study in November, 2018 was low risk study with no evidence of ischemia.  Echocardiogram was normal. ? ?Has been having some palpitations recently .   Have resolved today ?Does not have much energy .  ? ?July 21, 2375:  ?Nancy [name] is seen today for follow-up visit.  She has a history of coronary artery disease.  She seen with her daughter , [name] ?Has some dizziness when she turns and orthostasis with standing up .  ? ?Has occasional chest pressure when she wakes up . ? ?January 13, 2021: ?Nancy [name] is seen today for follow up of her CAD,  Seen with daughter, [name]. ? ?Has had some orthostasis ?Has not been sleeping enough ,  Falls asleep and wakes up at 3 ?Has some chest heaviness ?Is getting ready to move.   Seems to be  a bit stressed out  ?Going to [employer] .  ? ?January 21, 2022 ?Nancy [name] is seen with daughter, [name]  ?Her dementia is getting worse.  ? ?No cp,  no dyspnea  ?Complains of itchy rash under both arms - worse on L ?Lipids are mildly elevated ?Does not tolerate statins ?Will refer to the lipid clinic for consideration of a PCSK9 inhibitor or inclisiran. ? ?Past Medical History:  ?Diagnosis Date  ? Coronary atherosclerosis of native coronary artery   ? Dyslipidemia   ? Essential hypertension, benign   ? History of colonic polyps   ? Osteoporosis   ? Subdural hematoma ([place]) 04/27/2012  ? ? ?Past Surgical History:  ?Procedure Laterality Date  ? CORONARY ANGIOPLASTY WITH STENT PLACEMENT  2010  ? 3 times  ? CORONARY ARTERY BYPASS GRAFT  2009  ? CRANIOTOMY  04/27/2012  ? Procedure: CRANIOTOMY HEMATOMA EVACUATION SUBDURAL;  Surgeon: Eustace Moore, MD;  Location: [facility];  Service: Neurosurgery;  Laterality: Right;  Right Frontal Burr hole for Subdural Hematoma  ? NASAL SINUS SURGERY    ? POLYPECTOMY    ? ? ? ?Current Outpatient Medications  ?Medication Sig Dispense Refill  ? aspirin 81 MG tablet Take 81 mg by mouth daily.    ? CALCIUM-VITAMIN D PO Take by mouth.    ? donepezil (ARICEPT) 10 MG tablet Take 1 tablet by mouth once daily 90 tablet 3  ? escitalopram (LEXAPRO) 20 MG tablet Take 1 tablet (20 mg total) by mouth daily. 90 tablet 3  ? ezetimibe (ZETIA) 10 MG tablet Take 1 tablet (10 mg total) by mouth daily. 90 tablet 3  ? memantine (NAMENDA) 10 MG tablet Take 1 tablet every night for 2 weeks, then increase to 1 tablet twice a day 60 tablet 11  ? metoprolol succinate (TOPROL-XL) 25 MG 24 hr tablet Take 1 tablet (25 mg total) by mouth daily. 90 tablet 3  ? nitroGLYCERIN (NITROSTAT) 0.4 MG SL tablet Place 1 tablet (0.4 mg total) under the tongue every 5 (five) minutes as needed for chest pain. Please make overdue appt with Dr. Acie Fredrickson before anymore refills. Thank you 1st attempt 25 tablet 0  ? Omega-3 Fatty Acids (FISH OIL PO) Take by mouth.    ? VITAMIN D PO Take by mouth.    ? VITAMIN E PO Take by mouth.    ? cyanocobalamin 1000 MCG tablet Take 1,000 mcg by mouth daily. (Patient not taking: Reported on 01/21/2022)    ? ?No current facility-administered medications for this visit.  ? ? ?Allergies:   Alendronate, Atorvastatin, Bactrim [sulfamethoxazole-trimethoprim], Buprenorphine hcl, Morphine, Morphine and related, Prednisone, Sulfa antibiotics, Sulfasalazine, and Sulfonamide derivatives  ? ? ?Social History:  The patient  reports that she has never smoked. She has never used smokeless tobacco. She reports current alcohol use. She reports that she does not use drugs.  ? ?Family History:  The patient's family history includes Cancer in her maternal grandfather and mother; Heart attack in her father; Hypertension in her brother and mother; Kidney disease in her mother;  Memory loss in her maternal grandmother and mother; Stroke in her maternal grandmother.  ? ? ?ROS:  Please see the history of present illness.  ?Reviewed and current history.  Otherwise negative. ? ? ?Physical Exam: ?Blood pressure 132/64, pulse (!) 58, height '5\' 4"'$  (1.626 m), weight 139 lb 12.8 oz (63.4 kg), SpO2 97 %. ? ?GEN:  Well nourished, well developed in no acute distress ?HEENT: Normal ?NECK: No JVD;  No carotid bruits ?LYMPHATICS: No lymphadenopathy ?CARDIAC: RRR , no murmurs, rubs, gallops ?RESPIRATORY:  Clear to auscultation without rales, wheezing or rhonchi  ?Axilla:  she has a reddish, raised rash under both arms, worse on Left compared to right,  no rash under breasts  - ? candida ?ABDOMEN: Soft, non-tender, non-distended ?MUSCULOSKELETAL:  No edema; No deformity  ?SKIN: Warm and dry ?NEUROLOGIC:  Alert and oriented x 3 ? ? ?EKG: January 21, 2022: Sinus bradycardia.  No ST or T wave changes. ? ? ?Recent Labs: ?01/13/2022: ALT 17; BUN 21; Creatinine, Ser 1.25; Potassium 4.7; Sodium 145  ? ? ?Lipid Panel ?   ?Component Value Date/Time  ? CHOL 198 01/13/2022 0855  ? TRIG 100 01/13/2022 0855  ? HDL 63 01/13/2022 0855  ? CHOLHDL 3.1 01/13/2022 0855  ? CHOLHDL 3.1 02/20/2020 1135  ? VLDL 14 06/23/2016 0814  ? [place] 117 (H) 01/13/2022 0855  ? [place] 85 02/20/2020 1135  ? ?  ?Wt Readings from Last 3 Encounters:  ?01/21/22 139 lb 12.8 oz (63.4 kg)  ?11/25/21 137 lb (62.1 kg)  ?05/23/21 130 lb (59 kg)  ?  ? ?Other studies Reviewed: ?Additional studies/ records that were reviewed today include: . ?Review of the above records demonstrates:  ? ? ?ASSESSMENT AND PLAN: ? ?1. Coronary artery disease-status post coronary artery bypass graft- 2008 ?            Status post PCI in 2010.   ? No angina ?Her LDL is 117.  She does not tolerate statins.  She is only on Saturday.  We will refer her to the lipid clinic for consideration of PCSK9 inhibitor or inclisiran. ? ? ?2. craniotomy for subdural hematoma. ? ?3.    hyperlipidemia-    Her LDL is 117.  She does not tolerate statins.  She is only on Saturday.  We will refer her to the lipid clinic for consideration of PCSK9 inhibitor or inclisiran. ?     ? ?4.  Fatigue:    ?I.  Ok Edwards

## 2022-02-17 ENCOUNTER — Ambulatory Visit (INDEPENDENT_AMBULATORY_CARE_PROVIDER_SITE_OTHER): Payer: Medicare Other | Admitting: Pharmacist

## 2022-02-17 DIAGNOSIS — E7800 Pure hypercholesterolemia, unspecified: Secondary | ICD-10-CM

## 2022-02-17 DIAGNOSIS — H903 Sensorineural hearing loss, bilateral: Secondary | ICD-10-CM | POA: Insufficient documentation

## 2022-02-17 NOTE — Patient Instructions (Signed)
Your LDL cholesterol is 117 and your goal is < 55 ? ?I will submit information to your insurance for Repatha or Praluent and let you know when I hear back.  ?  ?They are subcutaneous injections given once every 2 weeks in the fatty tissue of your stomach or upper outer thigh. Store the medication in the fridge. You can let your dose warm up to room temperature for 30 minutes before injecting if you prefer. They ower your LDL cholesterol by 60% and helps to lower your chance of having a heart attack or stroke. ? ? ?

## 2022-02-17 NOTE — Progress Notes (Signed)
Patient ID: Nancy [name]                 DOB: 04-22-1946                    MRN: [id number] ? ? ? ? ?HPI: ?Nancy [name] is a 76 y.o. female patient referred to lipid clinic by Dr Acie Fredrickson. PMH is significant for CAD s/p CABG in 2008 and PCI in 2010, craniotomy for subdural hematoma, HLD, and HTN. She has a history of statin intolerance and was referred to lipid clinic for further management. ? ?Pt presents today with her daughter Nancy [name]. Reports prior joint pain and muscle pain on multiple statins, including atorvastatin, rosuvastatin, and pravastatin. Symptoms improved after statin discontinuation. Does have fatigue at baseline. ? ?Current Medications: ezetimibe '10mg'$  daily, OTC fish oil ?Intolerances: atorvastatin '20mg'$  daily - joint pain and muscle aches, rosuvastatin 5-'10mg'$  daily and '5mg'$  5x/week, pravastatin '40mg'$  daily ?Risk Factors: progressive ASCVD, HTN, age ?LDL goal: '55mg'$ /dL ? ?Diet: cereal with fruit and nuts in the morning.  ? ?Exercise: exercise classes ? ?Family History: The patient's family history includes Cancer in her maternal grandfather and mother; Heart attack in her father; Hypertension in her brother and mother; Kidney disease in her mother; Memory loss in her maternal grandmother and mother; Stroke in her maternal grandmother.  ? ?Social History: The patient  reports that she has never smoked. She has never used smokeless tobacco. She reports current alcohol use. She reports that she does not use drugs.  ? ?Labs: ?01/13/22: TC 198, TG 100, HDL 63, LDL 117 (ezetimibe '10mg'$  daily) ? ?Past Medical History:  ?Diagnosis Date  ? Coronary atherosclerosis of native coronary artery   ? Dyslipidemia   ? Essential hypertension, benign   ? History of colonic polyps   ? Osteoporosis   ? Subdural hematoma ([place]) 04/27/2012  ? ? ?Current Outpatient Medications on File Prior to Visit  ?Medication Sig Dispense Refill  ? aspirin 81 MG tablet Take 81 mg by mouth daily.    ? CALCIUM-VITAMIN D PO Take by mouth.    ?  cyanocobalamin 1000 MCG tablet Take 1,000 mcg by mouth daily. (Patient not taking: Reported on 01/21/2022)    ? donepezil (ARICEPT) 10 MG tablet Take 1 tablet by mouth once daily 90 tablet 3  ? escitalopram (LEXAPRO) 20 MG tablet Take 1 tablet (20 mg total) by mouth daily. 90 tablet 3  ? ezetimibe (ZETIA) 10 MG tablet Take 1 tablet (10 mg total) by mouth daily. 90 tablet 3  ? memantine (NAMENDA) 10 MG tablet Take 1 tablet every night for 2 weeks, then increase to 1 tablet twice a day 60 tablet 11  ? metoprolol succinate (TOPROL-XL) 25 MG 24 hr tablet Take 1 tablet (25 mg total) by mouth daily. 90 tablet 3  ? nitroGLYCERIN (NITROSTAT) 0.4 MG SL tablet Place 1 tablet (0.4 mg total) under the tongue every 5 (five) minutes as needed for chest pain. Please make overdue appt with Dr. Acie Fredrickson before anymore refills. Thank you 1st attempt 25 tablet 0  ? Omega-3 Fatty Acids (FISH OIL PO) Take by mouth.    ? VITAMIN D PO Take by mouth.    ? VITAMIN E PO Take by mouth.    ? ?No current facility-administered medications on file prior to visit.  ? ? ?Allergies  ?Allergen Reactions  ? Alendronate Shortness Of Breath and Swelling  ? Atorvastatin Other (See Comments)  ?  Muscle cramps  ? Bactrim [  Sulfamethoxazole-Trimethoprim] Other (See Comments)  ?  Blisters/skin peeling, nausea  ? Buprenorphine Hcl Nausea Only  ? Morphine Nausea Only  ?  OTHER  ? Morphine And Related Nausea Only  ? Prednisone Other (See Comments)  ?  Unknown  ? Sulfa Antibiotics Itching  ?  Blisters/skin peeling  ? Sulfasalazine Itching  ?  Blisters/skin peeling  ? Sulfonamide Derivatives Other (See Comments)  ?  Blisters/skin peeling  ? ? ?Assessment/Plan: ? ?1. Hyperlipidemia - LDL 117 on ezetimibe '10mg'$  daily, above goal < '55mg'$ /dL given hx of progressive ASCVD. Pt intolerant to 3 statins. Discussed initiating PCSK9i today including expected benefits, side effects, and injection technique, which pt is agreeable to trying. Will submit prior authorization for  Repatha '140mg'$  Q2W and follow up with pt once approved. She will qualify for copay card using her [employer] FEP plan. Will plan to recheck labs 2 months after stating therapy and can consider discontinuing ezetimibe at that time pending lab results. ? ?Asiana Benninger E. Eddis Pingleton, PharmD, BCACP, CPP ?[facility] [place], [place] [address] ?Phone: [phone]; Fax: [phone] ?02/17/2022 4:07 PM ? ? ? ?

## 2022-02-19 ENCOUNTER — Telehealth: Payer: Self-pay

## 2022-02-19 ENCOUNTER — Encounter: Payer: Self-pay | Admitting: Pharmacist

## 2022-02-19 MED ORDER — REPATHA SURECLICK 140 MG/ML ~~LOC~~ SOAJ: 1.0000 "pen " | SUBCUTANEOUS | 3 refills | Status: DC

## 2022-02-19 NOTE — Telephone Encounter (Signed)
Called and spoke to rep and they stated that they now have repatha approved thru 01/20/22-02/19/23. Will route to megan supple pharmd to make her aware.  ?

## 2022-02-19 NOTE — Telephone Encounter (Signed)
-----   Message from Leeroy Bock, [place] sent at 02/19/2022  2:20 PM EDT ----- ?Are you able to call pt's insurance to see what's going on with her Repatha PA request? I received this message on CMM: "Your PA request has been closed. Thank you for your ePA request for Repatha. Requests for initial Prior Authorizations require a secondary review to validate documented information. Current utilization, including samples, does not guarantee approval of coverage. The doctors office will be notified by fax listing the documentation required for review prior to a decision being made." ? ?I never received any follow up papers from them to fill out for the request though so I'm not sure if it's still pending? Repatha will be preferred PCSK9i on formulary July 1 so would make sense for them to approve it now. PA was sent through her [employer] FEP plan (on 01/13/21 scan in media tab). ? ?

## 2022-02-19 NOTE — Telephone Encounter (Signed)
Called pt's daughter [name] at [phone] (present at appt with pt, pt with memory issues) to advise her of Repatha approval. Rx sent to pharmacy. Daughter aware she'll need to call to activate Repatha copay card. Phone # sent to her via pt's MyChart as she is unable to write the # down currently. Pt sees PCP in the next few months and will have lipids rechecked there. ?

## 2022-03-04 ENCOUNTER — Emergency Department (HOSPITAL_BASED_OUTPATIENT_CLINIC_OR_DEPARTMENT_OTHER): Payer: Medicare Other | Admitting: Radiology

## 2022-03-04 ENCOUNTER — Encounter (HOSPITAL_BASED_OUTPATIENT_CLINIC_OR_DEPARTMENT_OTHER): Payer: Self-pay

## 2022-03-04 ENCOUNTER — Emergency Department (HOSPITAL_BASED_OUTPATIENT_CLINIC_OR_DEPARTMENT_OTHER)
Admission: EM | Admit: 2022-03-04 | Discharge: 2022-03-04 | Disposition: A | Discharge status: Home / Self Care | Payer: Medicare Other | Attending: Emergency Medicine | Admitting: Emergency Medicine

## 2022-03-04 ENCOUNTER — Other Ambulatory Visit: Payer: Self-pay

## 2022-03-04 DIAGNOSIS — X500XXA Overexertion from strenuous movement or load, initial encounter: Secondary | ICD-10-CM | POA: Diagnosis not present

## 2022-03-04 DIAGNOSIS — Y9301 Activity, walking, marching and hiking: Secondary | ICD-10-CM | POA: Diagnosis not present

## 2022-03-04 DIAGNOSIS — M25572 Pain in left ankle and joints of left foot: Secondary | ICD-10-CM | POA: Diagnosis present

## 2022-03-04 DIAGNOSIS — M25472 Effusion, left ankle: Secondary | ICD-10-CM | POA: Insufficient documentation

## 2022-03-04 DIAGNOSIS — Z79899 Other long term (current) drug therapy: Secondary | ICD-10-CM | POA: Diagnosis not present

## 2022-03-04 DIAGNOSIS — Z7982 Long term (current) use of aspirin: Secondary | ICD-10-CM | POA: Insufficient documentation

## 2022-03-04 MED ORDER — ACETAMINOPHEN 325 MG PO TABS
650.0000 mg | ORAL_TABLET | Freq: Once | ORAL | Status: AC
  Administered 2022-03-04: 650 mg via ORAL
  Filled 2022-03-04: qty 2

## 2022-03-04 NOTE — ED Provider Notes (Signed)
?[facility] ?Provider Note ? ? ?CSN: [id number] ?Arrival date & time: 03/04/22  1637 ? ?  ? ?History ? ?Chief Complaint  ?Patient presents with  ? Ankle Pain  ? ? ?Nancy [name] is a 76 y.o. female. ? ?Is ER chief complaint of left ankle pain.  She states that she was walking off of a curb and rolled the left ankle earlier today.  Complaining of pain to the left ankle denies any fall or injury elsewhere denies any headache or neck pain or back pain or other extremity pain.  No fever cough vomiting or diarrhea reported. ? ? ?  ? ?Home Medications ?Prior to Admission medications   ?Medication Sig Start Date End Date Taking? Authorizing Provider  ?aspirin 81 MG tablet Take 81 mg by mouth daily.    [provider]  ?CALCIUM-VITAMIN D PO Take by mouth.    [provider]  ?cyanocobalamin 1000 MCG tablet Take 1,000 mcg by mouth daily. ?Patient not taking: Reported on 01/21/2022    [provider]  ?donepezil (ARICEPT) 10 MG tablet Take 1 tablet by mouth once daily 05/23/21   Cameron Sprang, MD  ?escitalopram (LEXAPRO) 20 MG tablet Take 1 tablet (20 mg total) by mouth daily. 05/23/21   Cameron Sprang, MD  ?Evolocumab (REPATHA SURECLICK) 500 MG/ML SOAJ Inject 1 pen. into the skin every 14 (fourteen) days. 02/19/22   Nahser, Wonda Cheng, MD  ?ezetimibe (ZETIA) 10 MG tablet Take 1 tablet (10 mg total) by mouth daily. 01/13/21   Nahser, Wonda Cheng, MD  ?memantine (NAMENDA) 10 MG tablet Take 1 tablet every night for 2 weeks, then increase to 1 tablet twice a day 05/23/21   Cameron Sprang, MD  ?metoprolol succinate (TOPROL-XL) 25 MG 24 hr tablet Take 1 tablet (25 mg total) by mouth daily. 01/13/21   Nahser, Wonda Cheng, MD  ?nitroGLYCERIN (NITROSTAT) 0.4 MG SL tablet Place 1 tablet (0.4 mg total) under the tongue every 5 (five) minutes as needed for chest pain. Please make overdue appt with Dr. Acie Fredrickson before anymore refills. Thank you 1st attempt 11/20/20   Nahser, Wonda Cheng, MD  ?Omega-3  Fatty Acids (FISH OIL PO) Take by mouth.    [provider]  ?VITAMIN D PO Take by mouth.    [provider]  ?VITAMIN E PO Take by mouth.    [provider]  ?   ? ?Allergies    ?Alendronate, Atorvastatin, Bactrim [sulfamethoxazole-trimethoprim], Buprenorphine hcl, Morphine, Morphine and related, Prednisone, Sulfa antibiotics, Sulfasalazine, and Sulfonamide derivatives   ? ?Review of Systems   ?Review of Systems  ?Constitutional:  Negative for fever.  ?HENT:  Negative for ear pain.   ?Eyes:  Negative for pain.  ?Respiratory:  Negative for cough.   ?Cardiovascular:  Negative for chest pain.  ?Gastrointestinal:  Negative for abdominal pain.  ?Genitourinary:  Negative for flank pain.  ?Musculoskeletal:  Negative for back pain.  ?Skin:  Negative for rash.  ?Neurological:  Negative for headaches.  ? ?Physical Exam ?Updated Vital Signs ?BP (!) 150/60 (BP Location: Right Arm)   Pulse (!) 54   Temp 97.7 ?F (36.5 ?C)   Resp 16   SpO2 100%  ?Physical Exam ?Constitutional:   ?   General: She is not in acute distress. ?   Appearance: Normal appearance.  ?HENT:  ?   Head: Normocephalic and atraumatic.  ?   Nose: Nose normal.  ?Eyes:  ?   Extraocular Movements: Extraocular movements intact.  ?  Cardiovascular:  ?   Rate and Rhythm: Normal rate.  ?Pulmonary:  ?   Effort: Pulmonary effort is normal.  ?Musculoskeletal:  ?   Cervical back: Normal range of motion.  ?   Comments: Mild swelling to the left ankle tenderness to the medial and lateral malleoli.  No obvious deformity or crepitus noted.  Neurovascular intact distally.  Compartments are soft. ? ?No C or T or L-spine midline step-offs or tenderness noted.  ?Neurological:  ?   General: No focal deficit present.  ?   Mental Status: She is alert. Mental status is at baseline.  ? ? ?ED Results / Procedures / Treatments   ?Labs ?(all labs ordered are listed, but only abnormal results are displayed) ?Labs Reviewed - No data to  display ? ?EKG ?None ? ?Radiology ?DG Ankle Complete Left ? ?Result Date: 03/04/2022 ?CLINICAL DATA:  Left ankle pain after injury. EXAM: LEFT ANKLE COMPLETE - 3+ VIEW COMPARISON:  January 15, 2017. FINDINGS: There is no evidence of fracture, dislocation, or joint effusion. There is no evidence of arthropathy or other focal bone abnormality. Soft tissues are unremarkable. IMPRESSION: Negative. Electronically Signed   By: Marijo Conception M.D.   On: 03/04/2022 17:07   ? ?Procedures ?Procedures  ? ? ?Medications Ordered in ED ?Medications  ?acetaminophen (TYLENOL) tablet 650 mg (650 mg Oral Given 03/04/22 1932)  ? ? ?ED Course/ Medical Decision Making/ A&P ?  ?                        ?Medical Decision Making ?Amount and/or Complexity of Data Reviewed ?Radiology: ordered. ? ?Risk ?OTC drugs. ? ? ?History obtained from family at bedside. ? ?Chart review shows office visit with her primary care Dr. May second 2023. ? ?Simona Huh states include x-rays of the left ankle no acute fracture noted per radiology. ? ?Patient placed in a air cast.  Neurovascular tact after placement.  Advised outpatient follow-up with her doctor this week.  Advised Tylenol as needed at home.  Advised immediate return for worsening symptoms or any additional concerns. ? ? ? ? ? ? ? ?Final Clinical Impression(s) / ED Diagnoses ?Final diagnoses:  ?Acute left ankle pain  ? ? ?Rx / DC Orders ?ED Discharge Orders   ? ? None  ? ?  ? ? ?  ?Luna Fuse, MD ?03/04/22 1949 ? ?

## 2022-03-04 NOTE — ED Triage Notes (Signed)
Pt arrived via [facility] from independent living facility. Per EMS, pt reported to "roll her ankle" while stepping off a curb. Pt denies falling, denies hitting her head. Pt c/o pain in L ankle. EMS reports some swelling but no obvious deformity. Pulse, motor, and sensation intact while in EMS care. Pt denies any other injury. Language barrier with EMS d/t pt primarily speaks [place], little [place]. Daughter Erlene Quan can be contacted at [phone]. ? ?EMS VS ?BP 146/72 ?HR 60 ?98% SpO2 RA ?

## 2022-03-04 NOTE — Discharge Instructions (Signed)
Your x-ray shows no fracture.  Keep the splint in place as needed for support. ? ?Follow-up with your doctor this week. ? ?However if your pain worsens or if you have additional concerns return immediately back to the ER. ?

## 2022-03-06 ENCOUNTER — Other Ambulatory Visit: Payer: Self-pay | Admitting: Cardiovascular Disease

## 2022-03-09 ENCOUNTER — Other Ambulatory Visit: Payer: Self-pay | Admitting: Cardiovascular Disease

## 2022-05-22 ENCOUNTER — Ambulatory Visit: Payer: Medicare Other | Admitting: Neurology

## 2022-05-25 ENCOUNTER — Ambulatory Visit: Payer: Medicare Other | Admitting: Physician Assistant

## 2022-05-26 ENCOUNTER — Encounter: Payer: Self-pay | Admitting: Physician Assistant

## 2022-05-26 ENCOUNTER — Ambulatory Visit (INDEPENDENT_AMBULATORY_CARE_PROVIDER_SITE_OTHER): Payer: Medicare Other | Admitting: Physician Assistant

## 2022-05-26 ENCOUNTER — Other Ambulatory Visit (INDEPENDENT_AMBULATORY_CARE_PROVIDER_SITE_OTHER): Payer: Medicare Other

## 2022-05-26 VITALS — BP 113/69 | HR 71 | Ht 64.0 in | Wt 141.0 lb

## 2022-05-26 DIAGNOSIS — R413 Other amnesia: Secondary | ICD-10-CM

## 2022-05-26 DIAGNOSIS — G301 Alzheimer's disease with late onset: Secondary | ICD-10-CM | POA: Diagnosis not present

## 2022-05-26 DIAGNOSIS — I2510 Atherosclerotic heart disease of native coronary artery without angina pectoris: Secondary | ICD-10-CM

## 2022-05-26 DIAGNOSIS — F02818 Dementia in other diseases classified elsewhere, unspecified severity, with other behavioral disturbance: Secondary | ICD-10-CM

## 2022-05-26 NOTE — Patient Instructions (Addendum)
It was a pleasure to see you today at our office.   Recommendations:  Follow up in  6 months Continue donepezil 10 mg daily.   Continue Memantine 10 mg twice daily.  Check B12   Whom to call:  Memory  decline, memory medications: Call our office [phone]   For psychiatric meds, mood meds: Please have your primary care physician manage these medications.   Counseling regarding caregiver distress, including caregiver depression, anxiety and issues regarding community resources, adult day care programs, adult living facilities, or memory care questions:   Feel free to contact [place], Social Worker at [phone]   For assessment of decision of mental capacity and competency:  Call Dr. Anthoney Harada, geriatric psychiatrist at [phone]  For guidance in geriatric dementia issues please call [facility] [phone]    If you have any severe symptoms of a stroke, or other severe issues such as confusion,severe chills or fever, etc call 911 or go to the ER as you may need to be evaluated further   Feel free to visit Facebook page " [employer]" for tips of how to care for people with memory problems.   Feel free to go to the following database for funded clinical studies conducted around the world: http://saunders.com/   https://www.triadclinicaltrials.com/     RECOMMENDATIONS FOR ALL PATIENTS WITH MEMORY PROBLEMS: 1. Continue to exercise (Recommend 30 minutes of walking everyday, or 3 hours every week) 2. Increase social interactions - continue going to [place] and enjoy social gatherings with friends and family 3. Eat healthy, avoid fried foods and eat more fruits and vegetables 4. Maintain adequate blood pressure, blood sugar, and blood cholesterol level. Reducing the risk of stroke and cardiovascular disease also helps promoting better memory. 5. Avoid stressful situations. Live a simple life and avoid aggravations. Organize your time and  prepare for the next day in anticipation. 6. Sleep well, avoid any interruptions of sleep and avoid any distractions in the bedroom that may interfere with adequate sleep quality 7. Avoid sugar, avoid sweets as there is a strong link between excessive sugar intake, diabetes, and cognitive impairment We discussed the Mediterranean diet, which has been shown to help patients reduce the risk of progressive memory disorders and reduces cardiovascular risk. This includes eating fish, eat fruits and green leafy vegetables, nuts like almonds and hazelnuts, walnuts, and also use olive oil. Avoid fast foods and fried foods as much as possible. Avoid sweets and sugar as sugar use has been linked to worsening of memory function.  There is always a concern of gradual progression of memory problems. If this is the case, then we may need to adjust level of care according to patient needs. Support, both to the patient and caregiver, should then be put into place.    The Alzheimer's Association is here all day, every day for people facing Alzheimer's disease through our free 24/7 Helpline: [phone]. The Helpline provides reliable information and support to all those who need assistance, such as individuals living with memory loss, Alzheimer's or other dementia, caregivers, health care professionals and the public.  Our highly trained and knowledgeable staff can help you with: Understanding memory loss, dementia and Alzheimer's  Medications and other treatment options  General information about aging and brain health  Skills to provide quality care and to find the best care from professionals  Legal, financial and living-arrangement decisions Our Helpline also features: Confidential care consultation provided by master's level clinicians who can help with decision-making support,  crisis assistance and education on issues families face every day  Help in a caller's preferred language using our translation service  that features more than 200 languages and dialects  Referrals to local community programs, services and ongoing support     FALL PRECAUTIONS: Be cautious when walking. Scan the area for obstacles that may increase the risk of trips and falls. When getting up in the mornings, sit up at the edge of the bed for a few minutes before getting out of bed. Consider elevating the bed at the head end to avoid drop of blood pressure when getting up. Walk always in a well-lit room (use night lights in the walls). Avoid area rugs or power cords from appliances in the middle of the walkways. Use a walker or a cane if necessary and consider physical therapy for balance exercise. Get your eyesight checked regularly.  FINANCIAL OVERSIGHT: Supervision, especially oversight when making financial decisions or transactions is also recommended.  HOME SAFETY: Consider the safety of the kitchen when operating appliances like stoves, microwave oven, and blender. Consider having supervision and share cooking responsibilities until no longer able to participate in those. Accidents with firearms and other hazards in the house should be identified and addressed as well.   ABILITY TO BE LEFT ALONE: If patient is unable to contact 911 operator, consider using LifeLine, or when the need is there, arrange for someone to stay with patients. Smoking is a fire hazard, consider supervision or cessation. Risk of wandering should be assessed by caregiver and if detected at any point, supervision and safe proof recommendations should be instituted.  MEDICATION SUPERVISION: Inability to self-administer medication needs to be constantly addressed. Implement a mechanism to ensure safe administration of the medications.   DRIVING: Regarding driving, in patients with progressive memory problems, driving will be impaired. We advise to have someone else do the driving if trouble finding directions or if minor accidents are reported. Independent  driving assessment is available to determine safety of driving.   If you are interested in the driving assessment, you can contact the following:  [employer] in [place]  [place] [phone] or [phone]      [place] refers to food and lifestyle choices that are based on the traditions of countries located on the [employer]. This way of eating has been shown to help prevent certain conditions and improve outcomes for people who have chronic diseases, like kidney disease and heart disease. What are tips for following this plan? Lifestyle  Cook and eat meals together with your family, when possible. Drink enough fluid to keep your urine clear or pale yellow. Be physically active every day. This includes: Aerobic exercise like running or swimming. Leisure activities like gardening, walking, or housework. Get 7-8 hours of sleep each night. If recommended by your health care provider, drink red wine in moderation. This means 1 glass a day for nonpregnant women and 2 glasses a day for men. A glass of wine equals 5 oz (150 mL). Reading food labels  Check the serving size of packaged foods. For foods such as rice and pasta, the serving size refers to the amount of cooked product, not dry. Check the total fat in packaged foods. Avoid foods that have saturated fat or trans fats. Check the ingredients list for added sugars, such as corn syrup. Shopping  At the grocery store, buy most of your food  from the areas near the walls of the store. This includes: Fresh fruits and vegetables (produce). Grains, beans, nuts, and seeds. Some of these may be available in unpackaged forms or large amounts (in bulk). Fresh seafood. Poultry and eggs. Low-fat dairy products. Buy whole ingredients instead of prepackaged foods. Buy fresh fruits and  vegetables in-season from local farmers markets. Buy frozen fruits and vegetables in resealable bags. If you do not have access to quality fresh seafood, buy precooked frozen shrimp or canned fish, such as tuna, salmon, or sardines. Buy small amounts of raw or cooked vegetables, salads, or olives from the deli or salad bar at your store. Stock your pantry so you always have certain foods on hand, such as olive oil, canned tuna, canned tomatoes, rice, pasta, and beans. Cooking  Cook foods with extra-virgin olive oil instead of using butter or other vegetable oils. Have meat as a side dish, and have vegetables or grains as your main dish. This means having meat in small portions or adding small amounts of meat to foods like pasta or stew. Use beans or vegetables instead of meat in common dishes like chili or lasagna. Experiment with different cooking methods. Try roasting or broiling vegetables instead of steaming or sauteing them. Add frozen vegetables to soups, stews, pasta, or rice. Add nuts or seeds for added healthy fat at each meal. You can add these to yogurt, salads, or vegetable dishes. Marinate fish or vegetables using olive oil, lemon juice, garlic, and fresh herbs. Meal planning  Plan to eat 1 vegetarian meal one day each week. Try to work up to 2 vegetarian meals, if possible. Eat seafood 2 or more times a week. Have healthy snacks readily available, such as: Vegetable sticks with hummus. Greek yogurt. Fruit and nut trail mix. Eat balanced meals throughout the week. This includes: Fruit: 2-3 servings a day Vegetables: 4-5 servings a day Low-fat dairy: 2 servings a day Fish, poultry, or lean meat: 1 serving a day Beans and legumes: 2 or more servings a week Nuts and seeds: 1-2 servings a day Whole grains: 6-8 servings a day Extra-virgin olive oil: 3-4 servings a day Limit red meat and sweets to only a few servings a month What are my food choices? Mediterranean  diet Recommended Grains: Whole-grain pasta. Brown rice. Bulgar wheat. Polenta. Couscous. Whole-wheat bread. [place]. Vegetables: Artichokes. Beets. Broccoli. Cabbage. Carrots. Eggplant. Green beans. Chard. Kale. Spinach. Onions. Leeks. Peas. Squash. Tomatoes. Peppers. Radishes. Fruits: Apples. Apricots. Avocado. Berries. Bananas. Cherries. Dates. Figs. Grapes. Lemons. Melon. Oranges. Peaches. Plums. Pomegranate. Meats and other protein foods: Beans. Almonds. Sunflower seeds. Pine nuts. Peanuts. [place]. Salmon. Scallops. Shrimp. [place]. Tilapia. Clams. Oysters. Eggs. Dairy: Low-fat milk. Cheese. Greek yogurt. Beverages: Water. Red wine. Herbal tea. Fats and oils: Extra virgin olive oil. Avocado oil. Grape seed oil. Sweets and desserts: [place] yogurt with honey. Baked apples. Poached pears. Trail mix. Seasoning and other foods: Basil. Cilantro. Coriander. Cumin. Mint. Parsley. Sage. Rosemary. Tarragon. Garlic. Oregano. Thyme. Pepper. Balsalmic vinegar. Tahini. Hummus. Tomato sauce. Olives. Mushrooms. Limit these Grains: Prepackaged pasta or rice dishes. Prepackaged cereal with added sugar. Vegetables: Deep fried potatoes (french fries). Fruits: Fruit canned in syrup. Meats and other protein foods: Beef. Pork. Lamb. Poultry with skin. Hot dogs. Berniece Salines. Dairy: Ice cream. Sour cream. Whole milk. Beverages: Juice. Sugar-sweetened soft drinks. Beer. Liquor and spirits. Fats and oils: Butter. Canola oil. Vegetable oil. Beef fat (tallow). Lard. Sweets and desserts: Cookies. Cakes. Pies. Candy. Seasoning and other foods: Mayonnaise. Premade  sauces and marinades. The items listed may not be a complete list. Talk with your dietitian about what dietary choices are right for you. Summary The Mediterranean diet includes both food and lifestyle choices. Eat a variety of fresh fruits and vegetables, beans, nuts, seeds, and whole grains. Limit the amount of red meat and sweets that you eat. Talk with your  health care provider about whether it is safe for you to drink red wine in moderation. This means 1 glass a day for nonpregnant women and 2 glasses a day for men. A glass of wine equals 5 oz (150 mL). This information is not intended to replace advice given to you by your health care provider. Make sure you discuss any questions you have with your health care provider. Document Released: 05/28/2016 Document Revised: 06/30/2016 Document Reviewed: 05/28/2016 Elsevier Interactive Patient Education  2017 [place]  Your provider has requested that you have labwork completed today. Please go to [facility] ([id number]) on the second floor of this building before leaving the office today. You do not need to check in. If you are not called within 15 minutes please check with the front desk.

## 2022-05-26 NOTE — Progress Notes (Unsigned)
Assessment/Plan:   Dementia likely due to Alzheimer's disease with behavioral disturbance   Nancy [name] is a very pleasant 76 y.o. RH female with a history of right chronic subdural hematoma s/p craniectomy in 2013, hyperlipidemia, hypertension, CAD, seen today in follow up for memory loss.  She has a diagnosis of dementia likely due to Alzheimer's disease.  Patient is currently on donepezil 10 mg daily and memantine 10 mg twice daily, tolerating well.   Recommendations:    Continue donepezil 10 mg daily and memantine 10 mg twice daily   side effects were discussed Check B12 Follow up in  6 months. Recommend to check hearing. Continue to monitor mood by PCP   Case discussed with Dr. Delice Lesch who agrees with the plan     Subjective:    This patient is accompanied in the office by her daughter who supplements the history.  Previous records as well as any outside records available were reviewed prior to todays visit.  Last MMSE was 13 in 11/25/2021   Any changes in memory since last visit?  Daughter reports no significant changes except that sometimes she confuses the names of her children, grandchildren and son-in-law.   Patient lives with: She lives at [facility] repeats oneself?  Endorsed Disoriented when walking into a room?  Patient denies   Leaving objects in unusual places?  Denies.  She may misplace some objects however.    Ambulates  with difficulty?   Patient denies   Recent falls?  Patient denies   Any head injuries?  Patient denies   History of seizures?   Patient denies   Wandering behavior?  She does not wander off because she is afraid of getting lost.   Patient drives?   Patient no longer drives, she sold her car  any mood changes since last visit?  Patient denies   Any worsening depression?:  Patient denies   Hallucinations?  Patient denies   Paranoia?  "Last Saturday she could not find her face cream and she thought someone stole it ".  Patient reports  that occasionally cannot sleep, wakes up in the middle of the night.  Denies vivid dreams, REM behavior or sleepwalking.  She is to try melatonin History of sleep apnea?  Patient denies   Any hygiene concerns?  Patient denies   Independent of bathing and dressing?  Endorsed  Does the patient needs help with medications?  Daughter and the facility  Who is in charge of the finances? Daughter is in charge    Any changes in appetite?  Patient denies   Patient have trouble swallowing? Patient denies   Does the patient cook?  Patient denies   Any kitchen accidents such as leaving the stove on? Patient denies   Any headaches?  Patient denies   Double vision? Patient denies   Any focal numbness or tingling?  Patient denies   Chronic back pain Patient denies   Unilateral weakness?  Patient denies   Any tremors?  Patient denies   Any history of anosmia?  Patient denies   Any incontinence of urine?  Patient denies. Wears pads "just in case"    Any bowel dysfunction?   Patient denies       History on Initial Assessment 04/06/2018: This is a pleasant 76 year old ambidextrous woman with a history of right chronic subdural hematoma s/p craniectomy in 2013, hyperlipidemia, hypertension, CAD, presenting for evaluation of worsening memory. She and her husband reports memory changes started soon after  her brain surgery in 2013, but have become more progressive and significantly noticeable in the past year. Soon after surgery, her husband noticed that she would have word-finding difficulties, mixing up [place] and [place]. Over the past year, the word-finding difficulties have worsened, she states she sometimes forgets her grandparents names, when she meant to say grandchildren. She got a shock around 1-1.5 years ago at the doctor's office when she could not remember her address and had to look at her drivers license. She loses things a lot at home, she has 9 pairs of reading glasses and always has a hard time finding  them. She does not cook as much and denies leaving the stove on. She continues to manage bills and medications without significant difficulties, she occasionally forgets her medications when they are busy. She stopped driving after heart surgery years ago, she denied getting lost but felt afraid. She is able to bathe and dress independently. Over the past year, she has become quite anxious about minor things, sometimes the dog barking would make her very nervous. She has found taking [place] helps with this. She has a prescription for Xanax but does not take it regularly. She was noted to be anxious at several points during the visit today.   She denies any regular headaches but has a light one today from running around. She has light dizziness when standing at times. She denies any diplopia, dysarthria/dysphagia, focal numbness/tingling/weakness, both feet are numb. She has low back pain and occasional neck pain. She has constipation. No anosmia, tremors, REM behavior disorder. Her husband denies any paranoia or hallucinations. She does not sleep well, no wandering behavior. Her mother and maternal grandmother had memory issues. Aside from head injury in 2013 (hit her head getting up, leading to subdural hematoma), no other head injuries or falls. She rarely drinks alcohol.   She had an MRI brain with and without contrast in July 2019 which I personally reviewed, no acute changes, there was mild diffuse volume loss, minimal chronic microvascular disease.  PREVIOUS MEDICATIONS:   CURRENT MEDICATIONS:  Outpatient Encounter Medications as of 05/26/2022  Medication Sig   aspirin 81 MG tablet Take 81 mg by mouth daily.   CALCIUM-VITAMIN D PO Take by mouth.   cyanocobalamin 1000 MCG tablet Take 1,000 mcg by mouth daily.   donepezil (ARICEPT) 10 MG tablet Take 1 tablet by mouth once daily   escitalopram (LEXAPRO) 20 MG tablet Take 1 tablet (20 mg total) by mouth daily.   Evolocumab (REPATHA SURECLICK)  762 MG/ML SOAJ Inject 1 pen. into the skin every 14 (fourteen) days.   ezetimibe (ZETIA) 10 MG tablet Take 1 tablet by mouth once daily   memantine (NAMENDA) 10 MG tablet Take 1 tablet every night for 2 weeks, then increase to 1 tablet twice a day   metoprolol succinate (TOPROL-XL) 25 MG 24 hr tablet Take 1 tablet by mouth once daily   nitroGLYCERIN (NITROSTAT) 0.4 MG SL tablet Place 1 tablet (0.4 mg total) under the tongue every 5 (five) minutes as needed for chest pain. Please make overdue appt with Dr. Acie Fredrickson before anymore refills. Thank you 1st attempt   Omega-3 Fatty Acids (FISH OIL PO) Take by mouth.   VITAMIN D PO Take by mouth.   VITAMIN E PO Take by mouth.   No facility-administered encounter medications on file as of 05/26/2022.       11/25/2021   11:00 AM  MMSE - Mini Mental State Exam  Orientation to  time 0  Orientation to Place 1  Registration 3  Attention/ Calculation 0  Recall 2  Language- name 2 objects 2  Language- repeat 1  Language- follow 3 step command 2  Language- read & follow direction 1  Write a sentence 1  Copy design 0  Total score 13      06/25/2020    9:00 AM 06/19/2019    4:00 PM 11/18/2018    2:00 PM 04/06/2018   10:00 AM  Montreal Cognitive Assessment   Visuospatial/ Executive (0/5) '2 3 4 5  '$ Naming (0/3) '3 3 3 3  '$ Attention: Read list of digits (0/2) '2 2 2 2  '$ Attention: Read list of letters (0/1) '1 1 1 1  '$ Attention: Serial 7 subtraction starting at 100 (0/3) 0 '2 3 3  '$ Language: Repeat phrase (0/2) 1 1 0 2  Language : Fluency (0/1) 0 0 0 0  Abstraction (0/2) 0 2 0 2  Delayed Recall (0/5) 0 '3 4 1  '$ Orientation (0/6) '4 6 5 6  '$ Total '13 23 22 25  '$ Adjusted Score (based on education) '14 24 23     '$ Objective:     PHYSICAL EXAMINATION:    VITALS:   Vitals:   05/26/22 1432  BP: 113/69  Pulse: 71  SpO2: 95%  Weight: 141 lb (64 kg)  Height: '5\' 4"'$  (1.626 m)    GEN:  The patient appears stated age and is in NAD. HEENT:  Normocephalic,  atraumatic.   Neurological examination:  General: NAD, well-groomed, appears stated age. Orientation: The patient is alert. Oriented to person, place and not to date Cranial nerves: There is good facial symmetry.The speech is fluent and clear. No aphasia or dysarthria. Fund of knowledge is appropriate. Recent and remote memory are impaired. Attention and concentration are reduced.  Able to name objects and repeat phrases.  Hearing is slightly reduced to conversational tone.    Sensation: Sensation is intact to light touch throughout Motor: Strength is at least antigravity x4. Tremors: none  DTR's 2/4 in UE/LE     Movement examination: Tone: There is normal tone in the UE/LE Abnormal movements:  no tremor.  No myoclonus.  No asterixis.   Coordination:  There is no decremation with RAM's. Normal finger to nose  Gait and Station: The patient has no difficulty arising out of a deep-seated chair without the use of the hands. The patient's stride length is good.  Gait is cautious and narrow.    Thank you for allowing [place] the opportunity to participate in the care of this nice patient. Please do not hesitate to contact us for any questions or concerns.   Total time spent on today's visit was 34 minutes dedicated to this patient today, preparing to see patient, examining the patient, ordering tests and/or medications and counseling the patient, documenting clinical information in the EHR or other health record, independently interpreting results and communicating results to the patient/family, discussing treatment and goals, answering patient's questions and coordinating care.  Cc:  [place], [place].  Sharene Butters 05/26/2022 2:52 PM

## 2022-05-27 LAB — VITAMIN B12: Vitamin B-12: 528 pg/mL (ref 211–911)

## 2022-05-27 NOTE — Progress Notes (Signed)
B12 is normal, thanks

## 2022-06-02 ENCOUNTER — Ambulatory Visit: Payer: Medicare Other | Admitting: Neurology

## 2022-06-20 ENCOUNTER — Other Ambulatory Visit: Payer: Self-pay | Admitting: Neurology

## 2022-08-24 ENCOUNTER — Encounter (HOSPITAL_COMMUNITY): Payer: Self-pay | Admitting: Emergency Medicine

## 2022-08-24 ENCOUNTER — Other Ambulatory Visit: Payer: Self-pay

## 2022-08-24 ENCOUNTER — Emergency Department (HOSPITAL_COMMUNITY): Payer: Medicare Other

## 2022-08-24 ENCOUNTER — Emergency Department (HOSPITAL_COMMUNITY)
Admission: EM | Admit: 2022-08-24 | Discharge: 2022-08-24 | Disposition: A | Discharge status: Home / Self Care | Payer: Medicare Other | Attending: Emergency Medicine | Admitting: Emergency Medicine

## 2022-08-24 DIAGNOSIS — R059 Cough, unspecified: Secondary | ICD-10-CM | POA: Insufficient documentation

## 2022-08-24 DIAGNOSIS — R4182 Altered mental status, unspecified: Secondary | ICD-10-CM | POA: Diagnosis not present

## 2022-08-24 DIAGNOSIS — N3000 Acute cystitis without hematuria: Secondary | ICD-10-CM | POA: Insufficient documentation

## 2022-08-24 DIAGNOSIS — Z79899 Other long term (current) drug therapy: Secondary | ICD-10-CM | POA: Diagnosis not present

## 2022-08-24 DIAGNOSIS — F0390 Unspecified dementia without behavioral disturbance: Secondary | ICD-10-CM | POA: Insufficient documentation

## 2022-08-24 DIAGNOSIS — Z7982 Long term (current) use of aspirin: Secondary | ICD-10-CM | POA: Insufficient documentation

## 2022-08-24 DIAGNOSIS — I10 Essential (primary) hypertension: Secondary | ICD-10-CM | POA: Insufficient documentation

## 2022-08-24 DIAGNOSIS — R410 Disorientation, unspecified: Secondary | ICD-10-CM | POA: Diagnosis present

## 2022-08-24 LAB — URINALYSIS, ROUTINE W REFLEX MICROSCOPIC
Bilirubin Urine: NEGATIVE
Glucose, UA: NEGATIVE mg/dL
Hgb urine dipstick: NEGATIVE
Ketones, ur: NEGATIVE mg/dL
Nitrite: POSITIVE — AB
Protein, ur: NEGATIVE mg/dL
Specific Gravity, Urine: 1.005 (ref 1.005–1.030)
WBC, UA: >50 WBC/hpf — ABNORMAL HIGH (ref 0–5)
pH: 8 (ref 5.0–8.0)

## 2022-08-24 LAB — COMPREHENSIVE METABOLIC PANEL
ALT: 21 U/L (ref 0–44)
AST: 30 U/L (ref 15–41)
Albumin: 3.5 g/dL (ref 3.5–5.0)
Alkaline Phosphatase: 51 U/L (ref 38–126)
Anion gap: 7 (ref 5–15)
BUN: 11 mg/dL (ref 8–23)
CO2: 28 mmol/L (ref 22–32)
Calcium: 8.8 mg/dL — ABNORMAL LOW (ref 8.9–10.3)
Chloride: 105 mmol/L (ref 98–111)
Creatinine, Ser: 1.26 mg/dL — ABNORMAL HIGH (ref 0.44–1.00)
GFR, Estimated: 44 mL/min — ABNORMAL LOW (ref >60)
Glucose, Bld: 113 mg/dL — ABNORMAL HIGH (ref 70–99)
Potassium: 3.2 mmol/L — ABNORMAL LOW (ref 3.5–5.1)
Sodium: 140 mmol/L (ref 135–145)
Total Bilirubin: 0.4 mg/dL (ref 0.3–1.2)
Total Protein: 6.5 g/dL (ref 6.5–8.1)

## 2022-08-24 LAB — CBC WITH DIFFERENTIAL/PLATELET
Abs Immature Granulocytes: 0.02 10*3/uL (ref 0.00–0.07)
Basophils Absolute: 0.1 10*3/uL (ref 0.0–0.1)
Basophils Relative: 1 %
Eosinophils Absolute: 0.1 10*3/uL (ref 0.0–0.5)
Eosinophils Relative: 2 %
HCT: 40 % (ref 36.0–46.0)
Hemoglobin: 13.1 g/dL (ref 12.0–15.0)
Immature Granulocytes: 0 %
Lymphocytes Relative: 23 %
Lymphs Abs: 1.6 10*3/uL (ref 0.7–4.0)
MCH: 30.8 pg (ref 26.0–34.0)
MCHC: 32.8 g/dL (ref 30.0–36.0)
MCV: 94.1 fL (ref 80.0–100.0)
Monocytes Absolute: 0.7 10*3/uL (ref 0.1–1.0)
Monocytes Relative: 10 %
Neutro Abs: 4.5 10*3/uL (ref 1.7–7.7)
Neutrophils Relative %: 64 %
Platelets: 238 10*3/uL (ref 150–400)
RBC: 4.25 MIL/uL (ref 3.87–5.11)
RDW: 13 % (ref 11.5–15.5)
WBC: 7 10*3/uL (ref 4.0–10.5)
nRBC: 0 % (ref 0.0–0.2)

## 2022-08-24 LAB — RAPID URINE DRUG SCREEN, HOSP PERFORMED
Amphetamines: NOT DETECTED
Barbiturates: NOT DETECTED
Benzodiazepines: NOT DETECTED
Cocaine: NOT DETECTED
Opiates: NOT DETECTED
Tetrahydrocannabinol: NOT DETECTED

## 2022-08-24 LAB — ETHANOL: Alcohol, Ethyl (B): <10 mg/dL (ref <10)

## 2022-08-24 MED ORDER — CEPHALEXIN 500 MG PO CAPS: 500.0000 mg | ORAL_CAPSULE | Freq: Two times a day (BID) | ORAL | 0 refills | Status: DC

## 2022-08-24 MED ORDER — CEPHALEXIN 250 MG PO CAPS
500.0000 mg | ORAL_CAPSULE | Freq: Once | ORAL | Status: AC
  Administered 2022-08-24: 500 mg via ORAL
  Filled 2022-08-24: qty 2

## 2022-08-24 MED ORDER — CEPHALEXIN 500 MG PO CAPS: 500.0000 mg | ORAL_CAPSULE | Freq: Two times a day (BID) | ORAL | 0 refills | Status: AC

## 2022-08-24 NOTE — ED Triage Notes (Addendum)
Per EMS, pt from [facility], was sent for alerted mental status.  Pt has no documented hx of dementia, normally lives in the independent living however she was found tonight wondering the halls and twice she was taken back to her room.  She is totally disoriented at this time.    No complaints at this time.  Negative stroke screen.   160/80 CBG 131 Pulse 87 O2 96% RA  20 G L AC.  According to her hx. She does have a hx of dementia diagnosis.

## 2022-08-24 NOTE — ED Provider Triage Note (Signed)
Emergency Medicine Provider Triage Evaluation Note  Nancy [name] , a 76 y.o. female  was evaluated in triage.  Pt  presents from [facility] where she usually resides in the independent living side.  Was found in the memory care center wandering, very confused.  Return to her room x2 but continued to be altered from her mental status baseline prompting ED evaluation.  Patient disoriented, unclear why she is in the ED.  Review of Systems  Positive:  Negative:  Unable to complete ROS due to altered mental status Physical Exam  BP 133/63 (BP Location: Right Arm)   Pulse 68   Temp 98.6 F (37 C) (Oral)   Resp 15   SpO2 96%  Gen:   Awake, no distress   Resp:  Normal effort  MSK:   Moves extremities without difficulty Other:   RRR no M setters G.  Lungs CTA B.  Abdomen soft nondistended nontender.  Dry cough.  Neurologic exam nonfocal.  Neuro exam somewhat limited due to patient's poor participation with directions.  Speaking clearly no slurred speech, ambulatory without assistance.   Medical Decision Making  Medically screening exam initiated at 1:45 AM.  Appropriate orders placed.  TAHRA HITZEMAN was informed that the remainder of the evaluation will be completed by another provider, this initial triage assessment does not replace that evaluation, and the importance of remaining in the ED until their evaluation is complete.  This chart was dictated using voice recognition software, Dragon. Despite the best efforts of this provider to proofread and correct errors, errors may still occur which can change documentation meaning.    Shalayne Leach, [place], PA-C 08/24/22 0200

## 2022-08-24 NOTE — ED Provider Notes (Signed)
[facility] EMERGENCY DEPARTMENT Provider Note   CSN: [id number] Arrival date & time: 08/24/22  0141     History  Chief Complaint  Patient presents with   Altered Mental Status    Nancy [name] is a 76 y.o. female.  Patient here after episode of confusion last night.  History of dementia.  She is found walking in the halls of her independent living facility slightly confused.  Back to baseline per family.  She had a dry cough but possibly urinary symptoms.  No fall history.  No weakness or numbness or speech problems.  History of dementia, high cholesterol.  Patient has no complaints currently.  She is able to tell me her name and the names of her children in the room.  She denies any chest pain or shortness of breath or abdominal pain.  The history is provided by the patient and a relative.       Home Medications Prior to Admission medications   Medication Sig Start Date End Date Taking? Authorizing Provider  cephALEXin (KEFLEX) 500 MG capsule Take 1 capsule (500 mg total) by mouth 2 (two) times daily for 5 days. 08/24/22 08/29/22 Yes Tia Gelb, DO  aspirin 81 MG tablet Take 81 mg by mouth daily.    [provider]  CALCIUM-VITAMIN D PO Take by mouth.    [provider]  cyanocobalamin 1000 MCG tablet Take 1,000 mcg by mouth daily.    [provider]  donepezil (ARICEPT) 10 MG tablet Take 1 tablet by mouth once daily 06/23/22   Shawn Route, Coralee Pesa, PA-C  escitalopram (LEXAPRO) 20 MG tablet Take 1 tablet (20 mg total) by mouth daily. 05/23/21   Cameron Sprang, MD  Evolocumab (REPATHA SURECLICK) 588 MG/ML SOAJ Inject 1 pen. into the skin every 14 (fourteen) days. 02/19/22   Nahser, Wonda Cheng, MD  ezetimibe (ZETIA) 10 MG tablet Take 1 tablet by mouth once daily 03/09/22   Nahser, Wonda Cheng, MD  memantine (NAMENDA) 10 MG tablet TAKE 1 TABLET BY MOUTH IN THE EVENING FOR 2 WEEKS ,THEN  INCREASE  TO  1  TABLET  BY  MOUTH  TWICE  A  DAY 06/23/22    Shawn Route, Coralee Pesa, PA-C  metoprolol succinate (TOPROL-XL) 25 MG 24 hr tablet Take 1 tablet by mouth once daily 03/10/22   Nahser, Wonda Cheng, MD  nitroGLYCERIN (NITROSTAT) 0.4 MG SL tablet Place 1 tablet (0.4 mg total) under the tongue every 5 (five) minutes as needed for chest pain. Please make overdue appt with Dr. Acie Fredrickson before anymore refills. Thank you 1st attempt 11/20/20   Nahser, Wonda Cheng, MD  Omega-3 Fatty Acids (FISH OIL PO) Take by mouth.    [provider]  VITAMIN D PO Take by mouth.    [provider]  VITAMIN E PO Take by mouth.    [provider]      Allergies    Alendronate, Atorvastatin, Bactrim [sulfamethoxazole-trimethoprim], Buprenorphine hcl, Morphine, Morphine and related, Prednisone, Sulfa antibiotics, Sulfasalazine, and Sulfonamide derivatives    Review of Systems   Review of Systems  Physical Exam Updated Vital Signs BP 133/63 (BP Location: Right Arm)   Pulse 68   Temp 98.6 F (37 C) (Oral)   Resp 15   SpO2 96%  Physical Exam Vitals and nursing note reviewed.  Constitutional:      General: She is not in acute distress.    Appearance: She is well-developed. She is not ill-appearing.  HENT:  Head: Normocephalic and atraumatic.     Nose: Nose normal.     Mouth/Throat:     Mouth: Mucous membranes are moist.  Eyes:     Extraocular Movements: Extraocular movements intact.     Conjunctiva/sclera: Conjunctivae normal.     Pupils: Pupils are equal, round, and reactive to light.  Cardiovascular:     Rate and Rhythm: Normal rate and regular rhythm.     Pulses: Normal pulses.     Heart sounds: Normal heart sounds. No murmur heard. Pulmonary:     Effort: Pulmonary effort is normal. No respiratory distress.     Breath sounds: Normal breath sounds.  Abdominal:     Palpations: Abdomen is soft.     Tenderness: There is no abdominal tenderness.  Musculoskeletal:        General: No swelling.     Cervical back: Normal range of motion and  neck supple.  Skin:    General: Skin is warm and dry.     Capillary Refill: Capillary refill takes less than 2 seconds.  Neurological:     General: No focal deficit present.     Mental Status: She is alert.     Cranial Nerves: No cranial nerve deficit.     Sensory: No sensory deficit.     Motor: No weakness.     Coordination: Coordination normal.     Comments: 5+ out of 5 strength throughout, normal sensation, no drift, normal speech  Psychiatric:        Mood and Affect: Mood normal.     ED Results / Procedures / Treatments   Labs (all labs ordered are listed, but only abnormal results are displayed) Labs Reviewed  COMPREHENSIVE METABOLIC PANEL - Abnormal; Notable for the following components:      Result Value   Potassium 3.2 (*)    Glucose, Bld 113 (*)    Creatinine, Ser 1.26 (*)    Calcium 8.8 (*)    GFR, Estimated 44 (*)    All other components within normal limits  URINALYSIS, ROUTINE W REFLEX MICROSCOPIC - Abnormal; Notable for the following components:   APPearance HAZY (*)    Nitrite POSITIVE (*)    Leukocytes,Ua LARGE (*)    WBC, UA >50 (*)    Bacteria, UA MANY (*)    Non Squamous Epithelial 0-5 (*)    All other components within normal limits  URINE CULTURE  CBC WITH DIFFERENTIAL/PLATELET  RAPID URINE DRUG SCREEN, HOSP PERFORMED  ETHANOL    EKG None  Radiology DG Chest 1 View  Result Date: 08/24/2022 CLINICAL DATA:  [address] with altered mental status and coughing. Sent from [place]. EXAM: CHEST  1 VIEW COMPARISON:  PA Lat 03/13/2014 FINDINGS: The heart is slightly enlarged. 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 No vascular congestion is seen. There is calcification in the aortic arch with normal mediastinal configuration. The lungs clear apart from mild symmetric apical scar-like opacities. No substantial pleural effusion is seen. There is osteopenia with mild thoracic spondylosis. IMPRESSION: No evidence of acute chest disease. Mild cardiomegaly and  aortic atherosclerosis. Electronically Signed   By: Telford Nab M.D.   On: 08/24/2022 04:29   CT HEAD WO CONTRAST  Result Date: 08/24/2022 CLINICAL DATA:  Altered mental status EXAM: CT HEAD WITHOUT CONTRAST TECHNIQUE: Contiguous axial images were obtained from the base of the skull through the vertex without intravenous contrast. RADIATION DOSE REDUCTION: This exam was performed according to the departmental dose-optimization program which includes automated exposure control,  adjustment of the mA and/or kV according to patient size and/or use of iterative reconstruction technique. COMPARISON:  CT head 01/02/2019 FINDINGS: Brain: No intracranial hemorrhage, mass effect, or evidence of acute infarct. No hydrocephalus. No extra-axial fluid collection. Generalized cerebral atrophy with ex vacuo dilatation of the ventricles. Ill-defined hypoattenuation within the cerebral white matter is nonspecific but consistent with chronic small vessel ischemic disease. Vascular: No hyperdense vessel. Intracranial arterial calcification. Skull: No fracture or focal lesion. Craniotomy defect right frontal calvarium. Sinuses/Orbits: No acute finding. Postoperative change in the paranasal sinuses. Mucosal thickening with air-fluid levels in both maxillary sinuses. Moderate opacification of the ethmoid air cells. Partial opacification of the right sphenoid sinus. The mastoid air cells are well aerated. Other: None. IMPRESSION: No acute intracranial abnormality. Generalized atrophy and chronic microvascular ischemic change. Mucosal thickening in the paranasal sinuses with air-fluid levels in the maxillary sinuses can be seen with acute sinusitis. Electronically Signed   By: Placido Sou M.D.   On: 08/24/2022 02:48    Procedures Procedures    Medications Ordered in ED Medications  cephALEXin (KEFLEX) capsule 500 mg (500 mg Oral Given 08/24/22 1136)    ED Course/ Medical Decision Making/ A&P                            Medical Decision Making Amount and/or Complexity of Data Reviewed Labs: ordered.  Risk Prescription drug management.   Nancy [name] patient here with altered mental status, urinary symptoms.  Normal vitals.  No fever.  History of dementia, hypertension, high cholesterol.  Neurologically she is intact.  She appears to be acting at her baseline with family members at the bedside.  She lives in independent living facility.  She was confused yesterday would not get into her room at her independent living facility.  Differential diagnosis is possibly infectious process versus less likely head bleed.  Have no concern for stroke at this time.  Could be metabolic process.  We will get CBC, BMP, chest x-ray, urinalysis, CT head.  Per my review and interpretation of labs there is no significant anemia or electrolyte abnormality or kidney injury.  She has normal vitals.  No leukocytosis.  Have no concern for sepsis.  However, urinalysis appears to be consistent with infection.  Chest x-ray per my review and interpretation shows no evidence of pneumonia.  Head CT per my review interpretation shows no acute findings.  Overall patient appears well.  Seems back to her baseline.  Will start her on antibiotics for UTI.  Overall shared decision was made with family to have patient follow-up outpatient with primary care doctor.  This could just be progression of her dementia.  May require more support.  At this time family will continue to help and try to arrange for more care if this continues to happen.  Patient discharged in good condition.  This chart was dictated using voice recognition software.  Despite best efforts to proofread,  errors can occur which can change the documentation meaning.         Final Clinical Impression(s) / ED Diagnoses Final diagnoses:  Acute cystitis without hematuria    Rx / DC Orders ED Discharge Orders          Ordered    cephALEXin (KEFLEX) 500 MG capsule  2 times  daily        08/24/22 1122    cephALEXin (KEFLEX) 500 MG capsule  2 times daily,   Status:  Discontinued        08/24/22 Robinson, [place], DO 08/24/22 1137

## 2022-08-24 NOTE — Discharge Instructions (Addendum)
Follow-up with your primary care doctor to further discuss any need that patient may need more support at home.  Please return if symptoms worsen.

## 2022-08-27 LAB — URINE CULTURE: Culture: >=100000 — AB

## 2022-08-28 ENCOUNTER — Telehealth (HOSPITAL_BASED_OUTPATIENT_CLINIC_OR_DEPARTMENT_OTHER): Payer: Self-pay | Admitting: *Deleted

## 2022-08-28 NOTE — Telephone Encounter (Signed)
Post ED Visit - Positive Culture Follow-up  Culture report reviewed by antimicrobial stewardship pharmacist: [place] Team '[]'$  Elenor Quinones, Pharm.D. '[]'$  Heide Guile, Pharm.D., BCPS AQ-ID '[]'$  Parks Neptune, Pharm.D., BCPS '[]'$  Alycia Rossetti, Pharm.D., BCPS '[]'$  [place], [place].D., BCPS, AAHIVP '[]'$  Legrand Como, Pharm.D., BCPS, AAHIVP '[]'$  Salome Arnt, PharmD, BCPS '[]'$  Johnnette Gourd, PharmD, BCPS '[]'$  Hughes Better, PharmD, BCPS '[]'$  Leeroy Cha, PharmD '[]'$  Laqueta Linden, PharmD, BCPS '[]'$  Albertina Parr, PharmD  [place] Team '[]'$  Leodis Sias, PharmD '[]'$  Lindell Spar, PharmD '[]'$  Royetta Asal, PharmD '[]'$  Graylin Shiver, Rph '[]'$  Rema Fendt) Glennon Mac, PharmD '[]'$  Arlyn Dunning, PharmD '[]'$  Netta Cedars, PharmD '[]'$  Dia Sitter, PharmD '[]'$  Leone Haven, PharmD '[]'$  Gretta Arab, PharmD '[]'$  Theodis Shove, PharmD '[]'$  Peggyann Juba, PharmD '[]'$  Reuel Boom, PharmD   Positive urine culture Treated with Cephalexin, organism sensitive to the same and no further patient follow-up is required at this time.  Jeneen Rinks, PharmD  Harlon Flor [place] 08/28/2022, 11:20 AM

## 2022-09-21 ENCOUNTER — Telehealth: Payer: Self-pay | Admitting: Physician Assistant

## 2022-09-21 NOTE — Telephone Encounter (Signed)
Spoke with patient daughter and got her schedule for 10-06-22

## 2022-09-21 NOTE — Telephone Encounter (Signed)
Patient needs to be seen, prefers a Tuesday.

## 2022-09-21 NOTE — Telephone Encounter (Signed)
Patient daughter called and states that she needs to speak to someone about what the next step is for her mother as far as more asst  living help.

## 2022-09-22 ENCOUNTER — Other Ambulatory Visit: Payer: Self-pay | Admitting: Physician Assistant

## 2022-10-06 ENCOUNTER — Encounter: Payer: Self-pay | Admitting: Physician Assistant

## 2022-10-06 ENCOUNTER — Ambulatory Visit (INDEPENDENT_AMBULATORY_CARE_PROVIDER_SITE_OTHER): Payer: Medicare Other | Admitting: Physician Assistant

## 2022-10-06 VITALS — BP 139/66 | HR 69 | Resp 18 | Ht 64.0 in | Wt 142.0 lb

## 2022-10-06 DIAGNOSIS — F02818 Dementia in other diseases classified elsewhere, unspecified severity, with other behavioral disturbance: Secondary | ICD-10-CM | POA: Diagnosis not present

## 2022-10-06 DIAGNOSIS — I2510 Atherosclerotic heart disease of native coronary artery without angina pectoris: Secondary | ICD-10-CM

## 2022-10-06 DIAGNOSIS — G301 Alzheimer's disease with late onset: Secondary | ICD-10-CM

## 2022-10-06 MED ORDER — MEMANTINE HCL 10 MG PO TABS: ORAL_TABLET | ORAL | 3 refills | Status: AC

## 2022-10-06 MED ORDER — DONEPEZIL HCL 10 MG PO TABS: 10.0000 mg | ORAL_TABLET | Freq: Every day | ORAL | 3 refills | Status: AC

## 2022-10-06 NOTE — Patient Instructions (Signed)
It was a pleasure to see you today at our office.   Recommendations:  Follow up in  6 months Continue donepezil 10 mg daily.   Continue Memantine 10 mg twice daily and donepezil 10 mg daily  Check B12   Whom to call:  Memory  decline, memory medications: Call our office [phone]   For psychiatric meds, mood meds: Please have your primary care physician manage these medications.      For assessment of decision of mental capacity and competency:  Call Dr. Anthoney Harada, geriatric psychiatrist at [phone]  For guidance in geriatric dementia issues please call [facility] [phone]    If you have any severe symptoms of a stroke, or other severe issues such as confusion,severe chills or fever, etc call 911 or go to the ER as you may need to be evaluated further   Feel free to visit Facebook page " [email or web address]" for tips of how to care for people with memory problems.       RECOMMENDATIONS FOR ALL PATIENTS WITH MEMORY PROBLEMS: 1. Continue to exercise (Recommend 30 minutes of walking everyday, or 3 hours every week) 2. Increase social interactions - continue going to [place] and enjoy social gatherings with friends and family 3. Eat healthy, avoid fried foods and eat more fruits and vegetables 4. Maintain adequate blood pressure, blood sugar, and blood cholesterol level. Reducing the risk of stroke and cardiovascular disease also helps promoting better memory. 5. Avoid stressful situations. Live a simple life and avoid aggravations. Organize your time and prepare for the next day in anticipation. 6. Sleep well, avoid any interruptions of sleep and avoid any distractions in the bedroom that may interfere with adequate sleep quality 7. Avoid sugar, avoid sweets as there is a strong link between excessive sugar intake, diabetes, and cognitive impairment We discussed the Mediterranean diet, which has been shown to help patients reduce the risk of progressive memory  disorders and reduces cardiovascular risk. This includes eating fish, eat fruits and green leafy vegetables, nuts like almonds and hazelnuts, walnuts, and also use olive oil. Avoid fast foods and fried foods as much as possible. Avoid sweets and sugar as sugar use has been linked to worsening of memory function.  There is always a concern of gradual progression of memory problems. If this is the case, then we may need to adjust level of care according to patient needs. Support, both to the patient and caregiver, should then be put into place.    The Alzheimer's Association is here all day, every day for people facing Alzheimer's disease through our free 24/7 Helpline: [phone]. The Helpline provides reliable information and support to all those who need assistance, such as individuals living with memory loss, Alzheimer's or other dementia, caregivers, health care professionals and the public.  Our highly trained and knowledgeable staff can help you with: Understanding memory loss, dementia and Alzheimer's  Medications and other treatment options  General information about aging and brain health  Skills to provide quality care and to find the best care from professionals  Legal, financial and living-arrangement decisions Our Helpline also features: Confidential care consultation provided by master's level clinicians who can help with decision-making support, crisis assistance and education on issues families face every day  Help in a caller's preferred language using our translation service that features more than 200 languages and dialects  Referrals to local community programs, services and ongoing support     FALL PRECAUTIONS: Be cautious when walking. Scan the  area for obstacles that may increase the risk of trips and falls. When getting up in the mornings, sit up at the edge of the bed for a few minutes before getting out of bed. Consider elevating the bed at the head end to avoid drop of  blood pressure when getting up. Walk always in a well-lit room (use night lights in the walls). Avoid area rugs or power cords from appliances in the middle of the walkways. Use a walker or a cane if necessary and consider physical therapy for balance exercise. Get your eyesight checked regularly.  FINANCIAL OVERSIGHT: Supervision, especially oversight when making financial decisions or transactions is also recommended.  HOME SAFETY: Consider the safety of the kitchen when operating appliances like stoves, microwave oven, and blender. Consider having supervision and share cooking responsibilities until no longer able to participate in those. Accidents with firearms and other hazards in the house should be identified and addressed as well.   ABILITY TO BE LEFT ALONE: If patient is unable to contact 911 operator, consider using LifeLine, or when the need is there, arrange for someone to stay with patients. Smoking is a fire hazard, consider supervision or cessation. Risk of wandering should be assessed by caregiver and if detected at any point, supervision and safe proof recommendations should be instituted.  MEDICATION SUPERVISION: Inability to self-administer medication needs to be constantly addressed. Implement a mechanism to ensure safe administration of the medications.   DRIVING: Regarding driving, in patients with progressive memory problems, driving will be impaired. We advise to have someone else do the driving if trouble finding directions or if minor accidents are reported. Independent driving assessment is available to determine safety of driving.   If you are interested in the driving assessment, you can contact the following:  [employer] in [place]  [place] [phone] or [phone]      [place] refers to food and  lifestyle choices that are based on the traditions of countries located on the [employer]. This way of eating has been shown to help prevent certain conditions and improve outcomes for people who have chronic diseases, like kidney disease and heart disease. What are tips for following this plan? Lifestyle  Cook and eat meals together with your family, when possible. Drink enough fluid to keep your urine clear or pale yellow. Be physically active every day. This includes: Aerobic exercise like running or swimming. Leisure activities like gardening, walking, or housework. Get 7-8 hours of sleep each night. If recommended by your health care provider, drink red wine in moderation. This means 1 glass a day for nonpregnant women and 2 glasses a day for men. A glass of wine equals 5 oz (150 mL). Reading food labels  Check the serving size of packaged foods. For foods such as rice and pasta, the serving size refers to the amount of cooked product, not dry. Check the total fat in packaged foods. Avoid foods that have saturated fat or trans fats. Check the ingredients list for added sugars, such as corn syrup. Shopping  At the grocery store, buy most of your food from the areas near the walls of the store. This includes: Fresh fruits and vegetables (produce). Grains, beans, nuts, and seeds. Some of these may be available in unpackaged forms or large amounts (in bulk). Fresh seafood. Poultry and eggs. Low-fat dairy products. Buy whole ingredients instead of prepackaged foods. Buy  fresh fruits and vegetables in-season from local farmers markets. Buy frozen fruits and vegetables in resealable bags. If you do not have access to quality fresh seafood, buy precooked frozen shrimp or canned fish, such as tuna, salmon, or sardines. Buy small amounts of raw or cooked vegetables, salads, or olives from the deli or salad bar at your store. Stock your pantry so you always have certain foods on hand, such  as olive oil, canned tuna, canned tomatoes, rice, pasta, and beans. Cooking  Cook foods with extra-virgin olive oil instead of using butter or other vegetable oils. Have meat as a side dish, and have vegetables or grains as your main dish. This means having meat in small portions or adding small amounts of meat to foods like pasta or stew. Use beans or vegetables instead of meat in common dishes like chili or lasagna. Experiment with different cooking methods. Try roasting or broiling vegetables instead of steaming or sauteing them. Add frozen vegetables to soups, stews, pasta, or rice. Add nuts or seeds for added healthy fat at each meal. You can add these to yogurt, salads, or vegetable dishes. Marinate fish or vegetables using olive oil, lemon juice, garlic, and fresh herbs. Meal planning  Plan to eat 1 vegetarian meal one day each week. Try to work up to 2 vegetarian meals, if possible. Eat seafood 2 or more times a week. Have healthy snacks readily available, such as: Vegetable sticks with hummus. Greek yogurt. Fruit and nut trail mix. Eat balanced meals throughout the week. This includes: Fruit: 2-3 servings a day Vegetables: 4-5 servings a day Low-fat dairy: 2 servings a day Fish, poultry, or lean meat: 1 serving a day Beans and legumes: 2 or more servings a week Nuts and seeds: 1-2 servings a day Whole grains: 6-8 servings a day Extra-virgin olive oil: 3-4 servings a day Limit red meat and sweets to only a few servings a month What are my food choices? Mediterranean diet Recommended Grains: Whole-grain pasta. Brown rice. Bulgar wheat. Polenta. Couscous. Whole-wheat bread. [place]. Vegetables: Artichokes. Beets. Broccoli. Cabbage. Carrots. Eggplant. Green beans. Chard. Kale. Spinach. Onions. Leeks. Peas. Squash. Tomatoes. Peppers. Radishes. Fruits: Apples. Apricots. Avocado. Berries. Bananas. Cherries. Dates. Figs. Grapes. Lemons. Melon. Oranges. Peaches. Plums.  Pomegranate. Meats and other protein foods: Beans. Almonds. Sunflower seeds. Pine nuts. Peanuts. [place]. Salmon. Scallops. Shrimp. [place]. Tilapia. Clams. Oysters. Eggs. Dairy: Low-fat milk. Cheese. Greek yogurt. Beverages: Water. Red wine. Herbal tea. Fats and oils: Extra virgin olive oil. Avocado oil. Grape seed oil. Sweets and desserts: [place] yogurt with honey. Baked apples. Poached pears. Trail mix. Seasoning and other foods: Basil. Cilantro. Coriander. Cumin. Mint. Parsley. Sage. Rosemary. Tarragon. Garlic. Oregano. Thyme. Pepper. Balsalmic vinegar. Tahini. Hummus. Tomato sauce. Olives. Mushrooms. Limit these Grains: Prepackaged pasta or rice dishes. Prepackaged cereal with added sugar. Vegetables: Deep fried potatoes (french fries). Fruits: Fruit canned in syrup. Meats and other protein foods: Beef. Pork. Lamb. Poultry with skin. Hot dogs. [place] Salines. Dairy: Ice cream. Sour cream. Whole milk. Beverages: Juice. Sugar-sweetened soft drinks. Beer. Liquor and spirits. Fats and oils: Butter. Canola oil. Vegetable oil. Beef fat (tallow). Lard. Sweets and desserts: Cookies. Cakes. Pies. Candy. Seasoning and other foods: Mayonnaise. Premade sauces and marinades. The items listed may not be a complete list. Talk with your dietitian about what dietary choices are right for you. Summary The Mediterranean diet includes both food and lifestyle choices. Eat a variety of fresh fruits and vegetables, beans, nuts, seeds, and whole grains. Limit the amount  of red meat and sweets that you eat. Talk with your health care provider about whether it is safe for you to drink red wine in moderation. This means 1 glass a day for nonpregnant women and 2 glasses a day for men. A glass of wine equals 5 oz (150 mL). This information is not intended to replace advice given to you by your health care provider. Make sure you discuss any questions you have with your health care provider. Document Released: 05/28/2016 Document  Revised: 06/30/2016 Document Reviewed: 05/28/2016 Elsevier Interactive Patient Education  2017 [place]  Your provider has requested that you have labwork completed today. Please go to [facility] ([id number]) on the second floor of this building before leaving the office today. You do not need to check in. If you are not called within 15 minutes please check with the front desk.

## 2022-10-06 NOTE — Progress Notes (Signed)
Assessment/Plan:    Dementia likely due to Alzheimer's disease with behavioral disturbance  Nancy [name] is a very pleasant 76 y.o. RH female with a history of right chronic subdural hematoma s/p craniectomy in 2013, hyperlipidemia, hypertension, CAD seen today in follow up for memory loss. Patient is currently on donepezil 10 mg daily and memantine 10 mg twice daily.  There are some safety issues that they will need to be monitored, as the patient seems to have developed some wandering behavior.  Daughters are entertaining the idea of memory care pending on insurance issues.    Follow up in 6 months. Continue donepezil 10 mg and memantine 10 mg twice daily, side effects discussed Recommend to check hearing Continue to monitor mood as per PCP Continue to monitor cardiovascular risk factors Agree with 24/7 monitoring.  The patient may require memory care versus 24/7 home health nursing.    Subjective:    This patient is accompanied in the office by her daughter who supplements the history.  Previous records as well as any outside records available were reviewed prior to todays visit.  She was last seen on 05/26/2022.  Last MMSE was 13 on 11/25/2021.   Any changes in memory since last visit?  Daughter reports no significant changes except for sometimes confusing the names of her children, grandchildren and son-in-law, not worse than prior. Repeats oneself?  Endorsed Disoriented when walking into a room?  Patient denies, she enjoys staying at home. Leaving objects in unusual places?  Patient denies, although she may misplace things. Wandering behavior?  There were several incidents in which she walked out and could not walk back in, becoming confused, the nurse aide brought her back, but she could not recognize the apartment.  She was sent to the emergency department for further evaluation, she was found to have a UTI.  However, there were several reports that these was a repetitive  behavior, may have happened a couple more times.  Since then, family is entertaining transferring to memory care at [facility], as she still lives in independent living at the same facility. Any personality changes since last visit?  Patient denies   Any worsening depression?:  Only when thing are out of the routine, for example, her brother came to visit her, and she was feeling very tired during that time, her mood was changed and his left.    Hallucinations or paranoia?  She may accused the staff of stealing her things. Seizures?   Patient denies    Any sleep changes?Denies vivid dreams, REM behavior or sleepwalking   Sleep apnea?  Patient denies   Any hygiene concerns?  Patient denies   Independent of bathing and dressing?  Endorsed  Does the patient needs help with medications?  Daughter and the facility is in charge  Who is in charge of the finances?  Daughter is in charge    Any changes in appetite?  Patient denies    Patient have trouble swallowing? Patient denies   Does the patient cook?  Denies  Any headaches?  Patient denies   Chronic back pain Patient denies   Ambulates with difficulty?   Patient denies   Recent falls or head injuries?  Patient denies     Unilateral weakness, numbness or tingling?  Patient denies   Any tremors?  Patient denies   Any anosmia?  Patient denies   Any incontinence of urine?  Patient denies but wears pads just in case. Any bowel dysfunction?   Patient denies  Patient lives  at [place] Does the patient drive?  She no longer drives  PREVIOUS MEDICATIONS:   CURRENT MEDICATIONS:  Outpatient Encounter Medications as of 10/06/2022  Medication Sig   aspirin 81 MG tablet Take 81 mg by mouth daily.   CALCIUM-VITAMIN D PO Take by mouth.   cyanocobalamin 1000 MCG tablet Take 1,000 mcg by mouth daily.   escitalopram (LEXAPRO) 20 MG tablet Take 1 tablet (20 mg total) by mouth daily.   Evolocumab (REPATHA SURECLICK) 841 MG/ML SOAJ Inject 1 pen. into the  skin every 14 (fourteen) days.   ezetimibe (ZETIA) 10 MG tablet Take 1 tablet by mouth once daily   metoprolol succinate (TOPROL-XL) 25 MG 24 hr tablet Take 1 tablet by mouth once daily   nitroGLYCERIN (NITROSTAT) 0.4 MG SL tablet Place 1 tablet (0.4 mg total) under the tongue every 5 (five) minutes as needed for chest pain. Please make overdue appt with Dr. Acie Fredrickson before anymore refills. Thank you 1st attempt   Omega-3 Fatty Acids (FISH OIL PO) Take by mouth.   VITAMIN D PO Take by mouth.   VITAMIN E PO Take by mouth.   [DISCONTINUED] donepezil (ARICEPT) 10 MG tablet Take 1 tablet by mouth once daily   [DISCONTINUED] memantine (NAMENDA) 10 MG tablet TAKE 1 TABLET BY MOUTH IN THE EVENING FOR 2 WEEKS ,THEN  INCREASE  TO  1  TABLET  BY  MOUTH  TWICE  A  DAY   donepezil (ARICEPT) 10 MG tablet Take 1 tablet (10 mg total) by mouth daily.   memantine (NAMENDA) 10 MG tablet TAKE 1 TABLET TWICE  A  DAY   No facility-administered encounter medications on file as of 10/06/2022.       11/25/2021   11:00 AM  MMSE - Mini Mental State Exam  Orientation to time 0  Orientation to Place 1  Registration 3  Attention/ Calculation 0  Recall 2  Language- name 2 objects 2  Language- repeat 1  Language- follow 3 step command 2  Language- read & follow direction 1  Write a sentence 1  Copy design 0  Total score 13      06/25/2020    9:00 AM 06/19/2019    4:00 PM 11/18/2018    2:00 PM 04/06/2018   10:00 AM  Montreal Cognitive Assessment   Visuospatial/ Executive (0/5) '2 3 4 5  '$ Naming (0/3) '3 3 3 3  '$ Attention: Read list of digits (0/2) '2 2 2 2  '$ Attention: Read list of letters (0/1) '1 1 1 1  '$ Attention: Serial 7 subtraction starting at 100 (0/3) 0 '2 3 3  '$ Language: Repeat phrase (0/2) 1 1 0 2  Language : Fluency (0/1) 0 0 0 0  Abstraction (0/2) 0 2 0 2  Delayed Recall (0/5) 0 '3 4 1  '$ Orientation (0/6) '4 6 5 6  '$ Total '13 23 22 25  '$ Adjusted Score (based on education) '14 24 23     '$ Objective:      PHYSICAL EXAMINATION:    VITALS:   Vitals:   10/06/22 1442  BP: 139/66  Pulse: 69  Resp: 18  SpO2: 97%  Weight: 142 lb (64.4 kg)  Height: '5\' 4"'$  (1.626 m)    GEN:  The patient appears stated age and is in NAD. HEENT:  Normocephalic, atraumatic.   Neurological examination:  General: NAD, well-groomed, appears stated age. Orientation: The patient is alert. Oriented to person, place and date Cranial nerves: There is good facial symmetry.flat affect.  The speech is fluent and clear. No aphasia or dysarthria. Fund of knowledge is reduced. Recent and remote memory are impaired. Attention and concentration are reduced.  Able to name objects and repeat phrases.  Hearing is intact to conversational tone.    Sensation: Sensation is intact to light touch throughout Motor: Strength is at least antigravity x4. DTR's 2/4 in UE/LE     Movement examination: Tone: There is normal tone in the UE/LE Abnormal movements:  no tremor.  No myoclonus.  No asterixis.   Coordination:  There is no decremation with RAM's. Normal finger to nose  Gait and Station: The patient has no difficulty arising out of a deep-seated chair without the use of the hands. The patient's stride length is good.  Gait is cautious and narrow.    Thank you for allowing [place] the opportunity to participate in the care of this nice patient. Please do not hesitate to contact us for any questions or concerns.   Total time spent on today's visit was 30 minutes dedicated to this patient today, preparing to see patient, examining the patient, ordering tests and/or medications and counseling the patient, documenting clinical information in the EHR or other health record, independently interpreting results and communicating results to the patient/family, discussing treatment and goals, answering patient's questions and coordinating care.  Cc:  Margretta Sidle, MD  Sharene Butters 10/07/2022 10:39 AM

## 2022-10-26 ENCOUNTER — Telehealth: Payer: Self-pay | Admitting: Cardiovascular Disease

## 2022-10-26 NOTE — Telephone Encounter (Signed)
Daughter called to report the patient's recent lab results showed her LDL was 17.  Daughter is concerned and would like a call back regarding these results.  Daughter stated lab would be forwarding the results.

## 2022-10-28 NOTE — Telephone Encounter (Signed)
Spoke with Dr Acie Fredrickson today in clinic regarding LDL level of 17. He states this is perfect and that there's no such thing as "too low LDL." He states that years ago there was believed to be harm in lowering it too much, but this has since been disproved. Returned call to [name], but she is not on DPR, so left vague voicemail informing her that while I couldn't discuss details, I would call Jannell Franta listed on DPR and leave him a message and that what she had called about being concerned over was not concerning to MD. Durward Fortes on VM to Mitzi Hansen that if he have questions or wish to discuss further, to call office back,

## 2022-10-29 NOTE — Telephone Encounter (Signed)
Returned call to daughter [name]. Advised to update DPR at next visit since it's a scanned document and not something I can just erase. She absolutely understands and states will do.

## 2022-10-29 NOTE — Telephone Encounter (Signed)
Pt  daughter returned your call stating Pt spouse, Geneviene Tesch passed away in 2018/01/11.

## 2022-12-01 ENCOUNTER — Ambulatory Visit: Payer: Medicare Other | Admitting: Physician Assistant

## 2022-12-15 ENCOUNTER — Telehealth: Payer: Self-pay

## 2022-12-15 NOTE — Telephone Encounter (Signed)
Patients daughter said that forms were sent to [place] for Lari to receive at home care, hasn't heard back from anyone. Advised the turn around time of 7-14 business days and advised we would call once completed. Pam said that she can also could send over an example letter so its specific enough for insurance.

## 2022-12-16 NOTE — Telephone Encounter (Signed)
Left message that Nancy [name] has never received this, asked to fax to my attenetion

## 2023-01-17 ENCOUNTER — Encounter: Payer: Self-pay | Admitting: Cardiovascular Disease

## 2023-01-17 NOTE — Progress Notes (Unsigned)
Cardiology Office Note   Date:  01/18/2023   ID:  [name], [name] February 02, 1946, MRN [id number]  PCP:  Margretta Sidle, MD  Cardiologist:   Mertie Moores, MD   Chief Complaint  Patient presents with   Coronary Artery Disease        Hyperlipidemia   1. Coronary artery disease-status post coronary artery bypass graft- 2008             Status post PCI in 2010. 2. craniotomy for subdural hematoma. 3.  .  hyperlipidemia-    Nancy [name] is doing well from a cardiac standpoint.  She is having lots of leg cramps.  Takes Tylenol PM each night for cramps.  She's tried taking mustard and this does not help.  Her atorvastatin dose has been cut in 1/2 but she continues to have joint pain, muscle ache.  She also has numbness in her feet,   Has lots of ankle, knee and hip pain    January 03, 2015:   Nancy [name] is a 77 y.o. female who presents for follow-up of her coronary artery disease.  Sept. 13, 2016:  Doing well.  Normal stress myoview in March , 2016, Lipids looked great  Feeling well.  Not sleeping well.   January 07, 2106:  Has had some palpitations when she lies on her left side to sleep  Has worn the monitor in the past Does not want to necessarily wear it again  Has bilateral hand splints on - arthritis   No CP or dyspnea. Has slowed down with her walking  - walking slower and perhaps not as far.   Sept. 18, 2017  Has occasional irreg HR when she lays down on her left side.   Able to do her normal activities without cardiac problems . No CP or dyspnea . Lots of arthritis symptoms . Has some memory loss following her subdural hematoma  January 04, 2017:  Multiple concerns / complaints today   Has lots of muscle pains  , joint pain , Arthritis  Brief Dizziness when she stands up  Constipation Fatigues easily    No angina , Very rare episodes of chest tightness.  Has stopped some of her OTC meds ( fish oil, Vit B6, )   August 26, 2018:  Nancy [name] is  having fatigue  - more fatigue and shortness of breath with exertion  Very similar to prior to her CABG  Has occasional palpitations   April 11, 2018:   Nancy [name] is seen today for follow up of coronary artery disease. She was having some shortness of breath symptoms were very similar to her previous episodes of angina. Myoview study in November, 2018 was low risk study with no evidence of ischemia.  Echocardiogram was normal.  Has been having some palpitations recently .   Have resolved today Does not have much energy .   July 21, 2019:  Nancy [name] is seen today for follow-up visit.  She has a history of coronary artery disease.  She seen with her daughter , [name] Has some dizziness when she turns and orthostasis with standing up .   Has occasional chest pressure when she wakes up .  March 28, [id number]: Nancy [name] is seen today for follow up of her CAD,  Seen with daughter, [name].  Has had some orthostasis Has not been sleeping enough ,  Falls asleep and wakes up at 3 Has some chest heaviness Is getting ready to move.   Seems to be a bit  stressed out  Going to [place] .   April 5, [id number] Nancy [name] is seen with daughter, [name]  Her dementia is getting worse.   No cp,  no dyspnea  Complains of itchy rash under both arms - worse on L Lipids are mildly elevated Does not tolerate statins Will refer to the lipid clinic for consideration of a PCSK9 inhibitor or inclisiran.  April 1, 123456 Nancy [name] is seen for follow up of her CAD, HLD , dementia  Seen with daughter, [name]  Progressive cognative issues .   She had labs drawn through her primary medical doctor.  Her last LDL recorded that I can find is an LDL of 20.  I think it would be fine for her to discontinue the Zetia.  Continue Repatha    Past Medical History:  Diagnosis Date   Coronary atherosclerosis of native coronary artery    Dyslipidemia    Essential hypertension, benign    History of colonic polyps    Osteoporosis     Subdural hematoma 04/27/2012    Past Surgical History:  Procedure Laterality Date   CORONARY ANGIOPLASTY WITH STENT PLACEMENT  2010   3 times   CORONARY ARTERY BYPASS GRAFT  2009   CRANIOTOMY  04/27/2012   Procedure: CRANIOTOMY HEMATOMA EVACUATION SUBDURAL;  Surgeon: Eustace Moore, MD;  Location: [facility];  Service: Neurosurgery;  Laterality: Right;  Right Frontal Burr hole for Subdural Hematoma   NASAL SINUS SURGERY     POLYPECTOMY       Current Outpatient Medications  Medication Sig Dispense Refill   escitalopram (LEXAPRO) 10 MG tablet Take 10 mg by mouth daily. Pt takes 1 tablet daily.     melatonin 3 MG TABS tablet Take 3 mg by mouth at bedtime. Pt takes 1 tablet at night.     aspirin 81 MG tablet Take 81 mg by mouth daily.     CALCIUM-VITAMIN D PO Take by mouth.     cyanocobalamin 1000 MCG tablet Take 1,000 mcg by mouth daily.     donepezil (ARICEPT) 10 MG tablet Take 1 tablet (10 mg total) by mouth daily. 90 tablet 3   Evolocumab (REPATHA SURECLICK) XX123456 MG/ML SOAJ Inject 1 pen. into the skin every 14 (fourteen) days. 6 mL 3   ezetimibe (ZETIA) 10 MG tablet Take 1 tablet by mouth once daily 90 tablet 3   memantine (NAMENDA) 10 MG tablet TAKE 1 TABLET TWICE  A  DAY 180 tablet 3   metoprolol succinate (TOPROL-XL) 25 MG 24 hr tablet Take 1 tablet by mouth once daily 90 tablet 3   nitroGLYCERIN (NITROSTAT) 0.4 MG SL tablet Place 1 tablet (0.4 mg total) under the tongue every 5 (five) minutes as needed for chest pain. Please make overdue appt with Dr. Acie Fredrickson before anymore refills. Thank you 1st attempt 25 tablet 3   Omega-3 Fatty Acids (FISH OIL PO) Take by mouth.     VITAMIN D PO Take by mouth.     VITAMIN E PO Take by mouth.     No current facility-administered medications for this visit.    Allergies:   Alendronate, Atorvastatin, Bactrim [sulfamethoxazole-trimethoprim], Buprenorphine hcl, Morphine, Morphine and related, Prednisone, Sulfa antibiotics, Sulfasalazine, and  Sulfonamide derivatives    Social History:  The patient  reports that she has never smoked. She has never used smokeless tobacco. She reports current alcohol use. She reports that she does not use drugs.   Family History:  The patient's family history includes Cancer in her  maternal grandfather and mother; Heart attack in her father; Hypertension in her brother and mother; Kidney disease in her mother; Memory loss in her maternal grandmother and mother; Stroke in her maternal grandmother.    ROS:  Please see the history of present illness.  Reviewed and current history.  Otherwise negative.   Physical Exam: Blood pressure 122/66, pulse 60, height 5\' 4"  (1.626 m), weight 143 lb 6.4 oz (65 kg), SpO2 95 %.       GEN:  Well nourished, well developed in no acute distress HEENT: Normal NECK: No JVD; No carotid bruits LYMPHATICS: No lymphadenopathy CARDIAC: RRR , no murmurs, rubs, gallops RESPIRATORY:  Clear to auscultation without rales, wheezing or rhonchi  ABDOMEN: Soft, non-tender, non-distended MUSCULOSKELETAL:  No edema; No deformity  SKIN: Warm and dry NEUROLOGIC:  Alert and oriented x 3    EKG: January 18, 2023: Normal sinus rhythm at 60.  Nonspecific T wave abnormality.   Recent Labs: 08/24/2022: ALT 21; BUN 11; Creatinine, Ser 1.26; Hemoglobin 13.1; Platelets 238; Potassium 3.2; Sodium 140    Lipid Panel    Component Value Date/Time   CHOL 198 01/13/2022 0855   TRIG 100 01/13/2022 0855   HDL 63 01/13/2022 0855   CHOLHDL 3.1 01/13/2022 0855   CHOLHDL 3.1 02/20/2020 1135   VLDL 14 06/23/2016 0814   LDLCALC 117 (H) 01/13/2022 0855   LDLCALC 85 02/20/2020 1135     Wt Readings from Last 3 Encounters:  01/18/23 143 lb 6.4 oz (65 kg)  10/06/22 142 lb (64.4 kg)  05/26/22 141 lb (64 kg)     Other studies Reviewed: Additional studies/ records that were reviewed today include: . Review of the above records demonstrates:    ASSESSMENT AND PLAN:  1. Coronary artery  disease-status post coronary artery bypass graft- 2008             Status post PCI in 2010.   No angina  Will see her in 1 year    2. craniotomy for subdural hematoma.  3.   hyperlipidemia-     She had labs drawn through her primary medical doctor.  Her last LDL recorded that I can find is an LDL of 20.  I think it would be fine for her to discontinue the Zetia.  Continue Repatha        4.  Fatigue:    I.  Orthostatic hypotension.     Labs/ tests ordered today include:   Orders Placed This Encounter  Procedures   EKG 12-Lead    Disposition:    Signed, Mertie Moores, MD  01/18/2023 4:23 PM    [facility] [address] [place], [place], [place]  [address] Phone: [phone]; Fax: [phone]

## 2023-01-18 ENCOUNTER — Encounter: Payer: Self-pay | Admitting: Cardiovascular Disease

## 2023-01-18 ENCOUNTER — Ambulatory Visit: Payer: Medicare Other | Attending: Cardiovascular Disease | Admitting: Cardiovascular Disease

## 2023-01-18 VITALS — BP 122/66 | HR 60 | Ht 64.0 in | Wt 143.4 lb

## 2023-01-18 DIAGNOSIS — I2510 Atherosclerotic heart disease of native coronary artery without angina pectoris: Secondary | ICD-10-CM | POA: Insufficient documentation

## 2023-01-18 DIAGNOSIS — E7800 Pure hypercholesterolemia, unspecified: Secondary | ICD-10-CM | POA: Insufficient documentation

## 2023-01-18 MED ORDER — NITROGLYCERIN 0.4 MG SL SUBL: 0.4000 mg | SUBLINGUAL_TABLET | SUBLINGUAL | 3 refills | Status: AC | PRN

## 2023-01-18 NOTE — Patient Instructions (Signed)
Medication Instructions:  STOP Zetia/Ezetimibe *If you need a refill on your cardiac medications before your next appointment, please call your pharmacy*   Lab Work: NONE If you have labs (blood work) drawn today and your tests are completely normal, you will receive your results only by: [place] (if you have MyChart) OR A paper copy in the mail If you have any lab test that is abnormal or we need to change your treatment, we will call you to review the results.   Testing/Procedures: NONE   Follow-Up: At [facility], you and your health needs are our priority.  As part of our continuing mission to provide you with exceptional heart care, we have created designated Provider Care Teams.  These Care Teams include your primary Cardiologist (physician) and Advanced Practice Providers (APPs -  Physician Assistants and Nurse Practitioners) who all work together to provide you with the care you need, when you need it.  We recommend signing up for the patient portal called "MyChart".  Sign up information is provided on this After Visit Summary.  MyChart is used to connect with patients for Virtual Visits (Telemedicine).  Patients are able to view lab/test results, encounter notes, upcoming appointments, etc.  Non-urgent messages can be sent to your provider as well.   To learn more about what you can do with MyChart, go to [email or web address].    Your next appointment:   1 year(s)  Provider:   Mertie Moores, MD

## 2023-01-23 ENCOUNTER — Other Ambulatory Visit: Payer: Self-pay | Admitting: Cardiovascular Disease

## 2023-04-12 ENCOUNTER — Emergency Department (HOSPITAL_COMMUNITY): Payer: Medicare Other

## 2023-04-12 ENCOUNTER — Inpatient Hospital Stay (HOSPITAL_COMMUNITY)
Admission: EM | Admit: 2023-04-12 | Discharge: 2023-04-17 | DRG: 057 | Disposition: A | Discharge status: Home Health | Payer: Medicare Other | Source: Skilled Nursing Facility | Attending: Internal Medicine | Admitting: Internal Medicine

## 2023-04-12 DIAGNOSIS — I10 Essential (primary) hypertension: Secondary | ICD-10-CM

## 2023-04-12 DIAGNOSIS — R739 Hyperglycemia, unspecified: Secondary | ICD-10-CM | POA: Diagnosis present

## 2023-04-12 DIAGNOSIS — F02818 Dementia in other diseases classified elsewhere, unspecified severity, with other behavioral disturbance: Secondary | ICD-10-CM | POA: Diagnosis present

## 2023-04-12 DIAGNOSIS — W1830XA Fall on same level, unspecified, initial encounter: Secondary | ICD-10-CM | POA: Diagnosis present

## 2023-04-12 DIAGNOSIS — G309 Alzheimer's disease, unspecified: Principal | ICD-10-CM | POA: Diagnosis present

## 2023-04-12 DIAGNOSIS — Z66 Do not resuscitate: Secondary | ICD-10-CM | POA: Diagnosis present

## 2023-04-12 DIAGNOSIS — S32009A Unspecified fracture of unspecified lumbar vertebra, initial encounter for closed fracture: Secondary | ICD-10-CM | POA: Diagnosis present

## 2023-04-12 DIAGNOSIS — Z881 Allergy status to other antibiotic agents status: Secondary | ICD-10-CM

## 2023-04-12 DIAGNOSIS — M549 Dorsalgia, unspecified: Secondary | ICD-10-CM | POA: Diagnosis present

## 2023-04-12 DIAGNOSIS — Z841 Family history of disorders of kidney and ureter: Secondary | ICD-10-CM

## 2023-04-12 DIAGNOSIS — F02811 Dementia in other diseases classified elsewhere, unspecified severity, with agitation: Secondary | ICD-10-CM | POA: Diagnosis present

## 2023-04-12 DIAGNOSIS — Z882 Allergy status to sulfonamides status: Secondary | ICD-10-CM

## 2023-04-12 DIAGNOSIS — E785 Hyperlipidemia, unspecified: Secondary | ICD-10-CM | POA: Insufficient documentation

## 2023-04-12 DIAGNOSIS — N289 Disorder of kidney and ureter, unspecified: Secondary | ICD-10-CM | POA: Diagnosis present

## 2023-04-12 DIAGNOSIS — Y92099 Unspecified place in other non-institutional residence as the place of occurrence of the external cause: Secondary | ICD-10-CM

## 2023-04-12 DIAGNOSIS — Z7982 Long term (current) use of aspirin: Secondary | ICD-10-CM

## 2023-04-12 DIAGNOSIS — F03918 Unspecified dementia, unspecified severity, with other behavioral disturbance: Secondary | ICD-10-CM | POA: Diagnosis present

## 2023-04-12 DIAGNOSIS — F919 Conduct disorder, unspecified: Secondary | ICD-10-CM | POA: Diagnosis not present

## 2023-04-12 DIAGNOSIS — F0394 Unspecified dementia, unspecified severity, with anxiety: Secondary | ICD-10-CM | POA: Insufficient documentation

## 2023-04-12 DIAGNOSIS — Z823 Family history of stroke: Secondary | ICD-10-CM

## 2023-04-12 DIAGNOSIS — F0284 Dementia in other diseases classified elsewhere, unspecified severity, with anxiety: Secondary | ICD-10-CM | POA: Diagnosis present

## 2023-04-12 DIAGNOSIS — S0990XA Unspecified injury of head, initial encounter: Secondary | ICD-10-CM | POA: Diagnosis present

## 2023-04-12 DIAGNOSIS — I2510 Atherosclerotic heart disease of native coronary artery without angina pectoris: Secondary | ICD-10-CM | POA: Diagnosis present

## 2023-04-12 DIAGNOSIS — M810 Age-related osteoporosis without current pathological fracture: Secondary | ICD-10-CM | POA: Diagnosis present

## 2023-04-12 DIAGNOSIS — Z885 Allergy status to narcotic agent status: Secondary | ICD-10-CM

## 2023-04-12 DIAGNOSIS — Z79899 Other long term (current) drug therapy: Secondary | ICD-10-CM

## 2023-04-12 DIAGNOSIS — R451 Restlessness and agitation: Secondary | ICD-10-CM | POA: Diagnosis present

## 2023-04-12 DIAGNOSIS — S32019A Unspecified fracture of first lumbar vertebra, initial encounter for closed fracture: Secondary | ICD-10-CM

## 2023-04-12 DIAGNOSIS — E8809 Other disorders of plasma-protein metabolism, not elsewhere classified: Secondary | ICD-10-CM | POA: Diagnosis present

## 2023-04-12 DIAGNOSIS — Z955 Presence of coronary angioplasty implant and graft: Secondary | ICD-10-CM

## 2023-04-12 DIAGNOSIS — Z8719 Personal history of other diseases of the digestive system: Secondary | ICD-10-CM

## 2023-04-12 DIAGNOSIS — Z951 Presence of aortocoronary bypass graft: Secondary | ICD-10-CM

## 2023-04-12 DIAGNOSIS — Z888 Allergy status to other drugs, medicaments and biological substances status: Secondary | ICD-10-CM

## 2023-04-12 DIAGNOSIS — Z8249 Family history of ischemic heart disease and other diseases of the circulatory system: Secondary | ICD-10-CM

## 2023-04-12 LAB — CBC WITH DIFFERENTIAL/PLATELET
Abs Immature Granulocytes: 0.08 10*3/uL — ABNORMAL HIGH (ref 0.00–0.07)
Basophils Absolute: 0 10*3/uL (ref 0.0–0.1)
Basophils Relative: 1 %
Eosinophils Absolute: 0 10*3/uL (ref 0.0–0.5)
Eosinophils Relative: 1 %
HCT: 40.3 % (ref 36.0–46.0)
Hemoglobin: 13.3 g/dL (ref 12.0–15.0)
Immature Granulocytes: 1 %
Lymphocytes Relative: 18 %
Lymphs Abs: 1.1 10*3/uL (ref 0.7–4.0)
MCH: 31.8 pg (ref 26.0–34.0)
MCHC: 33 g/dL (ref 30.0–36.0)
MCV: 96.4 fL (ref 80.0–100.0)
Monocytes Absolute: 0.5 10*3/uL (ref 0.1–1.0)
Monocytes Relative: 8 %
Neutro Abs: 4.5 10*3/uL (ref 1.7–7.7)
Neutrophils Relative %: 71 %
Platelets: 216 10*3/uL (ref 150–400)
RBC: 4.18 MIL/uL (ref 3.87–5.11)
RDW: 12.9 % (ref 11.5–15.5)
WBC: 6.3 10*3/uL (ref 4.0–10.5)
nRBC: 0 % (ref 0.0–0.2)

## 2023-04-12 LAB — COMPREHENSIVE METABOLIC PANEL
ALT: 31 U/L (ref 0–44)
AST: 35 U/L (ref 15–41)
Albumin: 3.7 g/dL (ref 3.5–5.0)
Alkaline Phosphatase: 52 U/L (ref 38–126)
Anion gap: 9 (ref 5–15)
BUN: 15 mg/dL (ref 8–23)
CO2: 25 mmol/L (ref 22–32)
Calcium: 9.1 mg/dL (ref 8.9–10.3)
Chloride: 105 mmol/L (ref 98–111)
Creatinine, Ser: 1.13 mg/dL — ABNORMAL HIGH (ref 0.44–1.00)
GFR, Estimated: 50 mL/min — ABNORMAL LOW (ref >60)
Glucose, Bld: 105 mg/dL — ABNORMAL HIGH (ref 70–99)
Potassium: 4.1 mmol/L (ref 3.5–5.1)
Sodium: 139 mmol/L (ref 135–145)
Total Bilirubin: 0.5 mg/dL (ref 0.3–1.2)
Total Protein: 6.3 g/dL — ABNORMAL LOW (ref 6.5–8.1)

## 2023-04-12 LAB — URINALYSIS, W/ REFLEX TO CULTURE (INFECTION SUSPECTED)
Bilirubin Urine: NEGATIVE
Glucose, UA: NEGATIVE mg/dL
Hgb urine dipstick: NEGATIVE
Ketones, ur: 5 mg/dL — AB
Leukocytes,Ua: NEGATIVE
Nitrite: NEGATIVE
Protein, ur: NEGATIVE mg/dL
Specific Gravity, Urine: 1.014 (ref 1.005–1.030)
pH: 7 (ref 5.0–8.0)

## 2023-04-12 MED ORDER — ONDANSETRON 4 MG PO TBDP
4.0000 mg | ORAL_TABLET | Freq: Once | ORAL | Status: AC
  Administered 2023-04-12: 4 mg via ORAL
  Filled 2023-04-12: qty 1

## 2023-04-12 MED ORDER — HYDROCODONE-ACETAMINOPHEN 5-325 MG PO TABS: 1.0000 | ORAL_TABLET | Freq: Once | ORAL | Status: DC

## 2023-04-12 MED ORDER — FENTANYL CITRATE PF 50 MCG/ML IJ SOSY: 50.0000 ug | PREFILLED_SYRINGE | Freq: Once | INTRAMUSCULAR | Status: DC

## 2023-04-12 MED ORDER — ACETAMINOPHEN 325 MG PO TABS
650.0000 mg | ORAL_TABLET | Freq: Once | ORAL | Status: AC
  Administered 2023-04-12: 650 mg via ORAL
  Filled 2023-04-12: qty 2

## 2023-04-12 MED ORDER — FENTANYL CITRATE PF 50 MCG/ML IJ SOSY
25.0000 ug | PREFILLED_SYRINGE | Freq: Once | INTRAMUSCULAR | Status: AC
  Administered 2023-04-12: 25 ug via INTRAVENOUS
  Filled 2023-04-12: qty 1

## 2023-04-12 MED ORDER — HYDROCODONE-ACETAMINOPHEN 5-325 MG PO TABS
1.0000 | ORAL_TABLET | Freq: Once | ORAL | Status: AC
  Administered 2023-04-12: 1 via ORAL
  Filled 2023-04-12: qty 1

## 2023-04-12 NOTE — ED Notes (Signed)
ED TO INPATIENT HANDOFF REPORT  ED Nurse Name and Phone #: [phone]  S Name/Age/Gender Nancy [name] 77 y.o. female Room/Bed: 034C/034C  Code Status   Code Status: DNR  Home/SNF/Other Skilled nursing facility Patient oriented to: self and place Is this baseline? Yes   Triage Complete: Triage complete  Chief Complaint Lumbar vertebral fracture (HCC) [S32.009A]  Triage Note Pt BIB [facility] from [place] d/t a fall  pt has been having some behavior outburst more than normal pt has dementia at baseline  the patient lost her balance and fell. Pt c/o R hip pain . R hip was rotated outward. Not asure if the pt hit her head or not. VSS.   Allergies Allergies  Allergen Reactions   Alendronate Shortness Of Breath and Swelling   Atorvastatin Other (See Comments)    Muscle cramps   Bactrim [Sulfamethoxazole-Trimethoprim] Other (See Comments)    Blisters/skin peeling, nausea   Buprenorphine Hcl Nausea Only   Morphine Nausea Only    OTHER   Morphine And Codeine Nausea Only    Morphine only   Prednisone Other (See Comments)    Unknown   Sulfa Antibiotics Itching    Blisters/skin peeling   Sulfasalazine Itching    Blisters/skin peeling   Sulfonamide Derivatives Other (See Comments)    Blisters/skin peeling    Level of Care/Admitting Diagnosis ED Disposition     ED Disposition  Admit   Condition  --   Comment  Hospital Area: [facility] [[id number]]  Level of Care: Med-Surg [16]  May place patient in observation at [facility] or [facility] if equivalent level of care is available:: No  Covid Evaluation: Asymptomatic - no recent exposure (last 10 days) testing not required  Diagnosis: Lumbar vertebral fracture [facility]) [[id number]]  Admitting Physician: Hannah Beat [[id number]]  Attending Physician: Hannah Beat [[id number]]          B Medical/Surgery History Past Medical History:  Diagnosis Date   Coronary atherosclerosis of native coronary artery     Dyslipidemia    Essential hypertension, benign    History of colonic polyps    Osteoporosis    Subdural hematoma (HCC) 04/27/2012   Past Surgical History:  Procedure Laterality Date   CORONARY ANGIOPLASTY WITH STENT PLACEMENT  2010   3 times   CORONARY ARTERY BYPASS GRAFT  2009   CRANIOTOMY  04/27/2012   Procedure: CRANIOTOMY HEMATOMA EVACUATION SUBDURAL;  Surgeon: Tia Alert, MD;  Location: [facility];  Service: Neurosurgery;  Laterality: Right;  Right Frontal Burr hole for Subdural Hematoma   NASAL SINUS SURGERY     POLYPECTOMY       A IV Location/Drains/Wounds Patient Lines/Drains/Airways Status     Active Line/Drains/Airways     Name Placement date Placement time Site Days   Peripheral IV 04/12/23 20 G 1" Right Antecubital 04/12/23  2145  Antecubital  less than 1            Intake/Output Last 24 hours No intake or output data in the 24 hours ending 04/12/23 2349  Labs/Imaging Results for orders placed or performed during the hospital encounter of 04/12/23 (from the past 48 hour(s))  Comprehensive metabolic panel     Status: Abnormal   Collection Time: 04/12/23  3:35 PM  Result Value Ref Range   Sodium 139 135 - 145 mmol/L   Potassium 4.1 3.5 - 5.1 mmol/L   Chloride 105 98 - 111 mmol/L   CO2 25 22 - 32 mmol/L  Glucose, Bld 105 (H) 70 - 99 mg/dL    Comment: Glucose reference range applies only to samples taken after fasting for at least 8 hours.   BUN 15 8 - 23 mg/dL   Creatinine, Ser 4.09 (H) 0.44 - 1.00 mg/dL   Calcium 9.1 8.9 - 81.1 mg/dL   Total Protein 6.3 (L) 6.5 - 8.1 g/dL   Albumin 3.7 3.5 - 5.0 g/dL   AST 35 15 - 41 U/L   ALT 31 0 - 44 U/L   Alkaline Phosphatase 52 38 - 126 U/L   Total Bilirubin 0.5 0.3 - 1.2 mg/dL   GFR, Estimated 50 (L) >60 mL/min    Comment: (NOTE) Calculated using the CKD-EPI Creatinine Equation (2021)    Anion gap 9 5 - 15    Comment: Performed at [facility], [address], [place], [place] [address]  CBC with  Differential     Status: Abnormal   Collection Time: 04/12/23  3:35 PM  Result Value Ref Range   WBC 6.3 4.0 - 10.5 K/uL   RBC 4.18 3.87 - 5.11 MIL/uL   Hemoglobin 13.3 12.0 - 15.0 g/dL   HCT 29.5 62.1 - 30.8 %   MCV 96.4 80.0 - 100.0 fL   MCH 31.8 26.0 - 34.0 pg   MCHC 33.0 30.0 - 36.0 g/dL   RDW 65.7 84.6 - 96.2 %   Platelets 216 150 - 400 K/uL   nRBC 0.0 0.0 - 0.2 %   Neutrophils Relative % 71 %   Neutro Abs 4.5 1.7 - 7.7 K/uL   Lymphocytes Relative 18 %   Lymphs Abs 1.1 0.7 - 4.0 K/uL   Monocytes Relative 8 %   Monocytes Absolute 0.5 0.1 - 1.0 K/uL   Eosinophils Relative 1 %   Eosinophils Absolute 0.0 0.0 - 0.5 K/uL   Basophils Relative 1 %   Basophils Absolute 0.0 0.0 - 0.1 K/uL   Immature Granulocytes 1 %   Abs Immature Granulocytes 0.08 (H) 0.00 - 0.07 K/uL    Comment: Performed at [facility], [address], [place], [place] [address]  Urinalysis, w/ Reflex to Culture (Infection Suspected) -Urine, Clean Catch     Status: Abnormal   Collection Time: 04/12/23  5:30 PM  Result Value Ref Range   Specimen Source URINE, CLEAN CATCH    Color, Urine YELLOW YELLOW   APPearance CLOUDY (A) CLEAR   Specific Gravity, Urine 1.014 1.005 - 1.030   pH 7.0 5.0 - 8.0   Glucose, UA NEGATIVE NEGATIVE mg/dL   Hgb urine dipstick NEGATIVE NEGATIVE   Bilirubin Urine NEGATIVE NEGATIVE   Ketones, ur 5 (A) NEGATIVE mg/dL   Protein, ur NEGATIVE NEGATIVE mg/dL   Nitrite NEGATIVE NEGATIVE   Leukocytes,Ua NEGATIVE NEGATIVE   RBC / HPF 0-5 0 - 5 RBC/hpf   WBC, UA 0-5 0 - 5 WBC/hpf    Comment:        Reflex urine culture not performed if WBC <=10, OR if Squamous epithelial cells >5. If Squamous epithelial cells >5 suggest recollection.    Bacteria, UA RARE (A) NONE SEEN   Squamous Epithelial / HPF 0-5 0 - 5 /HPF   Mucus PRESENT    Amorphous Crystal PRESENT     Comment: Performed at [facility], [address], [place], [place] [address]   CT Lumbar Spine Wo  Contrast  Result Date: 04/12/2023 CLINICAL DATA:  Back trauma, no prior imaging (Age >= 16y) EXAM: CT  LUMBAR SPINE WITHOUT CONTRAST TECHNIQUE: Multidetector CT imaging of the lumbar spine was performed without intravenous contrast administration. Multiplanar CT image reconstructions were also generated. RADIATION DOSE REDUCTION: This exam was performed according to the departmental dose-optimization program which includes automated exposure control, adjustment of the mA and/or kV according to patient size and/or use of iterative reconstruction technique. COMPARISON:  X-ray lumbar spine 04/12/2023, MRI lumbar spine 11/07/14, CT abdomen pelvis 10/31/2017 FINDINGS: Segmentation: 5 lumbar type vertebrae. Alignment: Grade 1 anterolisthesis of L3 on L4. Vertebrae: Diffusely decreased bone density. Left L5-S1 pseudoarthrosis. Chronic mild vertebral body height loss at the L3 level with associated inferior endplate Schmorl node. Acute transverse fracture through the L1 vertebral body. Indeterminate, likely chronic, nondisplaced cortical fracture of the ventral wall of the L2 vertebral body. Paraspinal and other soft tissues: Negative. Disc levels: Multilevel intervertebral disc space vacuum phenomenon. Other: Atherosclerotic plaque.  Colonic diverticulosis. IMPRESSION: 1. Acute transverse fracture through the L1 vertebral body. 2. Indeterminate, likely chronic, nondisplaced cortical fracture of the ventral wall of the L2 vertebral body. 3. Chronic vertebral body height loss at the L3 level with associated inferior endplate Schmorl node. 4. Diffusely decreased bone density. 5. Other imaging findings of potential clinical significance: Colonic diverticulosis. Aortic Atherosclerosis (ICD10-I70.0). Electronically Signed   By: Tish Frederickson M.D.   On: 04/12/2023 19:27   DG Pelvis 1-2 Views  Result Date: 04/12/2023 CLINICAL DATA:  Trauma, fall EXAM: PELVIS - 1-2 VIEW COMPARISON:  None Available. FINDINGS: No displaced  fracture is seen. Joint spaces in both hips appear symmetrical. SI joints are unremarkable. Degenerative changes are noted in the visualized lower lumbar spine. IMPRESSION: No recent fracture is seen in pelvis. Electronically Signed   By: Ernie Avena M.D.   On: 04/12/2023 16:40   DG Lumbar Spine Complete  Result Date: 04/12/2023 CLINICAL DATA:  Trauma, fall EXAM: LUMBAR SPINE - COMPLETE 4+ VIEW COMPARISON:  MRI lumbar spine done on 11/07/2014 FINDINGS: There is decrease in height of the body of the L3 vertebra which has not changed since 11/07/2014. These mild decrease in height of upper endplate of body the L2 vertebra which may suggest recent or old mild compression. Degenerative changes are noted with disc space narrowing and bony spurs at multiple levels, more so at L4-L5 and L5-S1 levels. Osteopenia is seen in bony structures. Calcifications are noted in aorta and its major branches. IMPRESSION: There is mild 10-20% decrease in height of body of L2 vertebra which was not seen in the previous MRI done on 11/07/2014. This may suggest recent or old mild compression fracture. If there are focal symptoms, follow-up CT or MRI may be considered. Old compression fracture in the body of L3 vertebra has not changed since 11/07/2014. Lumbar spondylosis, more severe at L4-L5 and L5-S1 levels. Arteriosclerosis. Electronically Signed   By: Ernie Avena M.D.   On: 04/12/2023 16:39   DG Hand Complete Left  Result Date: 04/12/2023 CLINICAL DATA:  Trauma, fall EXAM: LEFT HAND - COMPLETE 3+ VIEW COMPARISON:  None Available. FINDINGS: No displaced fracture or dislocation is seen. There is a monitoring device partly obscuring the middle and distal phalanges of the index finger. There is metallic ring partly obscuring the proximal phalanx of ring finger. Marked degenerative changes are noted in first metatarsophalangeal joint. Chondrocalcinosis in the left wrist may be due to degenerative arthritis. Bony spurs  are seen in interphalangeal joints. IMPRESSION: No recent displaced fracture or dislocation is seen. Degenerative changes are noted in multiple joints, more severe in  the first carpometacarpal joint. Other findings as described in the body of the report. Electronically Signed   By: Ernie Avena M.D.   On: 04/12/2023 16:33   DG Chest 1 View  Result Date: 04/12/2023 CLINICAL DATA:  Trauma, fall EXAM: CHEST  1 VIEW COMPARISON:  08/24/2022 FINDINGS: Cardiac size is within normal limits. There is previous coronary bypass surgery. There are no signs of pulmonary edema or focal pulmonary consolidation. Small linear densities in medial left lower lung field may suggest scarring. Low position of the diaphragms suggest possible COPD. IMPRESSION: No active cardiopulmonary disease. Electronically Signed   By: Ernie Avena M.D.   On: 04/12/2023 16:32   CT Head Wo Contrast  Result Date: 04/12/2023 CLINICAL DATA:  Fall EXAM: CT HEAD WITHOUT CONTRAST CT CERVICAL SPINE WITHOUT CONTRAST TECHNIQUE: Multidetector CT imaging of the head and cervical spine was performed following the standard protocol without intravenous contrast. Multiplanar CT image reconstructions of the cervical spine were also generated. RADIATION DOSE REDUCTION: This exam was performed according to the departmental dose-optimization program which includes automated exposure control, adjustment of the mA and/or kV according to patient size and/or use of iterative reconstruction technique. COMPARISON:  CT head 08/24/2022, cervical spine CT 01/02/2019 FINDINGS: CT HEAD FINDINGS Brain: There is no acute intracranial hemorrhage, extra-axial fluid collection, or acute infarct. There is mild parenchymal volume loss with prominence of the ventricular system and extra-axial CSF spaces. The ventricles are stable in size since 2023 with unchanged dilation of the temporal horns. Gray-white differentiation is preserved. Hypodensity in the supratentorial  white matter likely reflects sequela of underlying chronic small-vessel ischemic change The pituitary and suprasellar region are normal. There is no mass lesion. There is no mass effect or midline shift. Vascular: There is calcification of the bilateral carotid siphons and vertebral arteries. Skull: A prior right parietal burr hole is noted. There is no calvarial fracture. Sinuses/Orbits: The paranasal sinuses are clear. The globes and orbits are unremarkable. Other: None. CT CERVICAL SPINE FINDINGS Alignment: Grade 1 anterolisthesis of C3 on C4, C4 on C5, and C7 on T1 is unchanged, likely degenerative in nature. There is no jumped or perched facet or other evidence of traumatic malalignment. Skull base and vertebrae: Skull base alignment is maintained. Vertebral body heights are preserved. There is no evidence of acute fracture. There is no suspicious osseous lesion. Soft tissues and spinal canal: No prevertebral fluid or swelling. No visible canal hematoma. Disc levels: Disc space narrowing degenerative endplate change C5-C6 and C6-C7 is stable. There is no high-grade spinal canal stenosis. Upper chest: The imaged lung apices are clear. Other: None. IMPRESSION: 1. No acute intracranial pathology. 2. No acute fracture or traumatic malalignment of the cervical spine. Electronically Signed   By: Lesia Hausen M.D.   On: 04/12/2023 16:21   CT Cervical Spine Wo Contrast  Result Date: 04/12/2023 CLINICAL DATA:  Fall EXAM: CT HEAD WITHOUT CONTRAST CT CERVICAL SPINE WITHOUT CONTRAST TECHNIQUE: Multidetector CT imaging of the head and cervical spine was performed following the standard protocol without intravenous contrast. Multiplanar CT image reconstructions of the cervical spine were also generated. RADIATION DOSE REDUCTION: This exam was performed according to the departmental dose-optimization program which includes automated exposure control, adjustment of the mA and/or kV according to patient size and/or use of  iterative reconstruction technique. COMPARISON:  CT head 08/24/2022, cervical spine CT 01/02/2019 FINDINGS: CT HEAD FINDINGS Brain: There is no acute intracranial hemorrhage, extra-axial fluid collection, or acute infarct. There is mild parenchymal volume  loss with prominence of the ventricular system and extra-axial CSF spaces. The ventricles are stable in size since 2023 with unchanged dilation of the temporal horns. Gray-white differentiation is preserved. Hypodensity in the supratentorial white matter likely reflects sequela of underlying chronic small-vessel ischemic change The pituitary and suprasellar region are normal. There is no mass lesion. There is no mass effect or midline shift. Vascular: There is calcification of the bilateral carotid siphons and vertebral arteries. Skull: A prior right parietal burr hole is noted. There is no calvarial fracture. Sinuses/Orbits: The paranasal sinuses are clear. The globes and orbits are unremarkable. Other: None. CT CERVICAL SPINE FINDINGS Alignment: Grade 1 anterolisthesis of C3 on C4, C4 on C5, and C7 on T1 is unchanged, likely degenerative in nature. There is no jumped or perched facet or other evidence of traumatic malalignment. Skull base and vertebrae: Skull base alignment is maintained. Vertebral body heights are preserved. There is no evidence of acute fracture. There is no suspicious osseous lesion. Soft tissues and spinal canal: No prevertebral fluid or swelling. No visible canal hematoma. Disc levels: Disc space narrowing degenerative endplate change C5-C6 and C6-C7 is stable. There is no high-grade spinal canal stenosis. Upper chest: The imaged lung apices are clear. Other: None. IMPRESSION: 1. No acute intracranial pathology. 2. No acute fracture or traumatic malalignment of the cervical spine. Electronically Signed   By: Lesia Hausen M.D.   On: 04/12/2023 16:21    Pending Labs Unresulted Labs (From admission, onward)    None        Vitals/Pain Today's Vitals   04/12/23 1525 04/12/23 1830 04/12/23 2019  BP: (!) 142/103 (!) 140/56 (!) 147/70  Pulse: (!) 55 64 (!) 55  Resp: (!) 24 12 17   Temp: 98.3 F (36.8 C)  98.6 F (37 C)  TempSrc: Oral  Oral  SpO2: 97% 97% 99%  PainSc:   4     Isolation Precautions No active isolations  Medications Medications  acetaminophen (TYLENOL) tablet 650 mg (650 mg Oral Given 04/12/23 2025)  ondansetron (ZOFRAN-ODT) disintegrating tablet 4 mg (4 mg Oral Given 04/12/23 2056)  fentaNYL (SUBLIMAZE) injection 25 mcg (25 mcg Intravenous Given 04/12/23 2149)  HYDROcodone-acetaminophen (NORCO/VICODIN) 5-325 MG per tablet 1 tablet (1 tablet Oral Given 04/12/23 2316)    Mobility walks     Focused Assessments A&O to self and location. GCS 14.  R Recommendations: See Admitting Provider 4Note  Report given to:   Additional Notes: A&O to self and location. Hx of dementia. Pt does not remember that she fell and hurt herself but cries in pain with movement. Continent.

## 2023-04-12 NOTE — Discharge Instructions (Signed)
If you develop worsening, recurrent, or continued back pain, numbness or weakness in the legs, incontinence of your bowels or bladders, numbness of your buttocks, fever, abdominal pain, or any other new/concerning symptoms then return to the ER for evaluation.  

## 2023-04-12 NOTE — ED Notes (Signed)
Ortho tech, Manny, contacted for lumbar corsett. Was informed pt would get it when she is upstairs.

## 2023-04-12 NOTE — ED Notes (Signed)
Provider evaluated pt and plan to admit pt at this time.

## 2023-04-12 NOTE — TOC CAGE-AID Note (Signed)
Transition of Care [facility]) - CAGE-AID Screening   Patient Details  Name: Nancy [name] MRN: [id number] Date of Birth: 1946-04-24  [facility]) CM/SW Contact:    Katha Hamming, RN Phone Number: 04/12/2023, 8:06 PM   Clinical Narrative:    CAGE-AID Screening: Substance Abuse Screening unable to be completed due to: : Patient unable to participate (hx dementia, from memory care unit)

## 2023-04-12 NOTE — ED Provider Notes (Addendum)
[facility] AT [facility] Provider Note   CSN: [id number] Arrival date & time: 04/12/23  1502     History  Chief Complaint  Patient presents with   Marletta Lor    Nancy [name] is a 77 y.o. female.  HPI 77 year old female presents with a fall. History is limited as patient has dementia and EMS is no longer present.  I was able to talk to the staff at the nursing home and apparently she was having a "behavioral episode" today.  She was kicking and spitting.  She fell and hit her head.  She has been having increased behaviors over the past weekend.  This has been ongoing for the last couple weeks in general.  They have been given her increased Xanax, up to 3 times a day which has not helped.  They also added buspirone on the 18th.  EMS reported that the patient seemed to have a right hip injury but this is not currently present and the patient denies any current hip pain.   Home Medications Prior to Admission medications   Medication Sig Start Date End Date Taking? Authorizing Provider  aspirin 81 MG tablet Take 81 mg by mouth daily.    [provider]  CALCIUM-VITAMIN D PO Take by mouth.    [provider]  cyanocobalamin 1000 MCG tablet Take 1,000 mcg by mouth daily.    [provider]  donepezil (ARICEPT) 10 MG tablet Take 1 tablet (10 mg total) by mouth daily. 10/06/22   Marcos Eke, PA-C  escitalopram (LEXAPRO) 10 MG tablet Take 10 mg by mouth daily. Pt takes 1 tablet daily.    [provider]  Evolocumab (REPATHA SURECLICK) 140 MG/ML SOAJ INJECT 1 PEN SUBCUTANEOUSLY EVERY TWO WEEKS (EVERY 14(FOURTEEN) DAYS) 01/25/23   Nahser, Deloris Ping, MD  melatonin 3 MG TABS tablet Take 3 mg by mouth at bedtime. Pt takes 1 tablet at night.    [provider]  memantine (NAMENDA) 10 MG tablet TAKE 1 TABLET TWICE  A  DAY 10/06/22   Gwynneth Munson, Sung Amabile, PA-C  metoprolol succinate (TOPROL-XL) 25 MG 24 hr tablet Take 1 tablet by  mouth once daily 03/10/22   Nahser, Deloris Ping, MD  nitroGLYCERIN (NITROSTAT) 0.4 MG SL tablet Place 1 tablet (0.4 mg total) under the tongue every 5 (five) minutes as needed for chest pain. Please make overdue appt with Dr. Elease Hashimoto before anymore refills. Thank you 1st attempt 01/18/23   Nahser, Deloris Ping, MD  Omega-3 Fatty Acids (FISH OIL PO) Take by mouth.    [provider]  VITAMIN D PO Take by mouth.    [provider]  VITAMIN E PO Take by mouth.    [provider]      Allergies    Alendronate, Atorvastatin, Bactrim [sulfamethoxazole-trimethoprim], Buprenorphine hcl, Morphine, Morphine and codeine, Prednisone, Sulfa antibiotics, Sulfasalazine, and Sulfonamide derivatives    Review of Systems   Review of Systems  Unable to perform ROS: Dementia    Physical Exam Updated Vital Signs BP (!) 147/70 (BP Location: Right Arm)   Pulse (!) 55   Temp 98.6 F (37 C) (Oral)   Resp 17   SpO2 99%  Physical Exam Vitals and nursing note reviewed.  Constitutional:      General: She is not in acute distress.    Appearance: She is well-developed. She is not ill-appearing or diaphoretic.     Interventions: Cervical collar in place.  HENT:  Head: Normocephalic and atraumatic.  Cardiovascular:     Rate and Rhythm: Regular rhythm. Bradycardia present.     Pulses:          Dorsalis pedis pulses are 2+ on the right side and 2+ on the left side.     Heart sounds: Normal heart sounds.  Pulmonary:     Effort: Pulmonary effort is normal.     Breath sounds: Normal breath sounds.  Abdominal:     General: There is no distension.     Palpations: Abdomen is soft.     Tenderness: There is no abdominal tenderness.  Musculoskeletal:     Left hand: Tenderness present.     Cervical back: No tenderness.     Thoracic back: No tenderness.     Lumbar back: Tenderness present.     Right hip: No tenderness. Normal range of motion.     Left hip: No tenderness. Normal range of motion.      Comments: She reports "pressure" in her left pointer finger, no trauma noted.  Skin:    General: Skin is warm and dry.  Neurological:     Mental Status: She is alert. She is disoriented.     Comments: Awake, alert, knows she's in the hospital. However disoriented to time. Equal strength in all 4 extremities     ED Results / Procedures / Treatments   Labs (all labs ordered are listed, but only abnormal results are displayed) Labs Reviewed  URINALYSIS, W/ REFLEX TO CULTURE (INFECTION SUSPECTED) - Abnormal; Notable for the following components:      Result Value   APPearance CLOUDY (*)    Ketones, ur 5 (*)    Bacteria, UA RARE (*)    All other components within normal limits  COMPREHENSIVE METABOLIC PANEL - Abnormal; Notable for the following components:   Glucose, Bld 105 (*)    Creatinine, Ser 1.13 (*)    Total Protein 6.3 (*)    GFR, Estimated 50 (*)    All other components within normal limits  CBC WITH DIFFERENTIAL/PLATELET - Abnormal; Notable for the following components:   Abs Immature Granulocytes 0.08 (*)    All other components within normal limits    EKG EKG Interpretation  Date/Time:  Monday April 12 2023 16:56:37 EDT Ventricular Rate:  61 PR Interval:  162 QRS Duration: 103 QT Interval:  416 QTC Calculation: 419 R Axis:   82 Text Interpretation: Sinus rhythm Atrial premature complex Borderline right axis deviation Confirmed by Pricilla Loveless [phone]) on 04/12/2023 6:57:16 PM  Radiology CT Head Wo Contrast  Result Date: 04/12/2023 CLINICAL DATA:  Fall EXAM: CT HEAD WITHOUT CONTRAST CT CERVICAL SPINE WITHOUT CONTRAST TECHNIQUE: Multidetector CT imaging of the head and cervical spine was performed following the standard protocol without intravenous contrast. Multiplanar CT image reconstructions of the cervical spine were also generated. RADIATION DOSE REDUCTION: This exam was performed according to the departmental dose-optimization program which includes  automated exposure control, adjustment of the mA and/or kV according to patient size and/or use of iterative reconstruction technique. COMPARISON:  CT head 08/24/2022, cervical spine CT 01/02/2019 FINDINGS: CT HEAD FINDINGS Brain: There is no acute intracranial hemorrhage, extra-axial fluid collection, or acute infarct. There is mild parenchymal volume loss with prominence of the ventricular system and extra-axial CSF spaces. The ventricles are stable in size since 2023 with unchanged dilation of the temporal horns. Gray-white differentiation is preserved. Hypodensity in the supratentorial white matter likely reflects sequela of underlying chronic small-vessel ischemic change The  pituitary and suprasellar region are normal. There is no mass lesion. There is no mass effect or midline shift. Vascular: There is calcification of the bilateral carotid siphons and vertebral arteries. Skull: A prior right parietal burr hole is noted. There is no calvarial fracture. Sinuses/Orbits: The paranasal sinuses are clear. The globes and orbits are unremarkable. Other: None. CT CERVICAL SPINE FINDINGS Alignment: Grade 1 anterolisthesis of C3 on C4, C4 on C5, and C7 on T1 is unchanged, likely degenerative in nature. There is no jumped or perched facet or other evidence of traumatic malalignment. Skull base and vertebrae: Skull base alignment is maintained. Vertebral body heights are preserved. There is no evidence of acute fracture. There is no suspicious osseous lesion. Soft tissues and spinal canal: No prevertebral fluid or swelling. No visible canal hematoma. Disc levels: Disc space narrowing degenerative endplate change C5-C6 and C6-C7 is stable. There is no high-grade spinal canal stenosis. Upper chest: The imaged lung apices are clear. Other: None. IMPRESSION: 1. No acute intracranial pathology. 2. No acute fracture or traumatic malalignment of the cervical spine. Electronically Signed   By: Lesia Hausen M.D.   On: 04/12/2023  16:21    Procedures Procedures    Medications Ordered in ED Medications  acetaminophen (TYLENOL) tablet 650 mg (650 mg Oral Given 04/12/23 2025)    ED Course/ Medical Decision Making/ A&P Clinical Course as of 04/12/23 2238  Mon Apr 12, 2023  2107 Dr Arville Care would like [place] to try some IV narcotic first, and see if she still needs admission, in addition to calling neurosurgery.  [SG]  2143 Neurosurgery [facility]) recommends lumbosacral corsett when up, and follow up in clinic in around 2 weeks for repeat xrays. [SG]    Clinical Course User Index [SG] Pricilla Loveless, MD                             Medical Decision Making Amount and/or Complexity of Data Reviewed Labs: ordered.    Details: No UTI Radiology: ordered and independent interpretation performed.    Details: L1 fracture No head bleed  ECG/medicine tests: ordered and independent interpretation performed.    Details: No ischemia  Risk OTC drugs. Prescription drug management. Decision regarding hospitalization.   Patient presents with progressively worsening agitation/behavioral issues.  Currently she has been calm and without any significant psychiatric issues in her multiple hours here.  From a fall perspective it does appear that she has an acute L1 fracture, though does not appear to be an acute surgical problem and there is no neurodeficits.  There was a question of possible hip injury but she has no hip pain on range of motion either passive or active and her x-ray is unremarkable.  Given this there is extremely low suspicion of a hip fracture.  She seems to have good pain control at rest though when she sits up it does seem to still hurt in her back.     I had planned to discharge her with outpatient psychiatry follow up (which is later this week), but then she had severe pain with walking and threw up. I think it would be best to admit for pain control. Given zofran and will try some hydrocodone.    Fentanyl  seems to help at rest, but then when I sit her up she's back in severe pain. Discussed with Dr. Arville Care for admission.     Final Clinical Impression(s) / ED Diagnoses Final diagnoses:  Closed fracture of first lumbar vertebra, unspecified fracture morphology, initial encounter [facility])    Rx / DC Orders ED Discharge Orders     None         Pricilla Loveless, MD 04/12/23 2050    Pricilla Loveless, MD 04/12/23 2238

## 2023-04-12 NOTE — ED Notes (Signed)
Pt up to restroom prior to discharge. Pt's daughter's assisting her to restroom and state they will take pt back to facility. When exiting restroom and getting into wheelchair, pt began vomiting. Provider notified and coming to evaluate pt.

## 2023-04-12 NOTE — ED Triage Notes (Signed)
Pt BIB [facility] from [facility] d/t a fall  pt has been having some behavior outburst more than normal pt has dementia at baseline  the patient lost her balance and fell. Pt c/o R hip pain . R hip was rotated outward. Not asure if the pt hit her head or not. VSS.

## 2023-04-13 ENCOUNTER — Observation Stay (HOSPITAL_COMMUNITY): Payer: Medicare Other

## 2023-04-13 ENCOUNTER — Ambulatory Visit: Payer: Medicare Other | Admitting: Physician Assistant

## 2023-04-13 DIAGNOSIS — E785 Hyperlipidemia, unspecified: Secondary | ICD-10-CM | POA: Insufficient documentation

## 2023-04-13 DIAGNOSIS — S32019A Unspecified fracture of first lumbar vertebra, initial encounter for closed fracture: Secondary | ICD-10-CM | POA: Diagnosis not present

## 2023-04-13 DIAGNOSIS — S32019D Unspecified fracture of first lumbar vertebra, subsequent encounter for fracture with routine healing: Secondary | ICD-10-CM

## 2023-04-13 DIAGNOSIS — I10 Essential (primary) hypertension: Secondary | ICD-10-CM

## 2023-04-13 DIAGNOSIS — F03918 Unspecified dementia, unspecified severity, with other behavioral disturbance: Secondary | ICD-10-CM | POA: Diagnosis not present

## 2023-04-13 DIAGNOSIS — I2510 Atherosclerotic heart disease of native coronary artery without angina pectoris: Secondary | ICD-10-CM | POA: Diagnosis not present

## 2023-04-13 LAB — BASIC METABOLIC PANEL
Anion gap: 11 (ref 5–15)
BUN: 14 mg/dL (ref 8–23)
CO2: 27 mmol/L (ref 22–32)
Calcium: 9 mg/dL (ref 8.9–10.3)
Chloride: 102 mmol/L (ref 98–111)
Creatinine, Ser: 0.97 mg/dL (ref 0.44–1.00)
GFR, Estimated: >60 mL/min (ref >60)
Glucose, Bld: 148 mg/dL — ABNORMAL HIGH (ref 70–99)
Potassium: 3.9 mmol/L (ref 3.5–5.1)
Sodium: 140 mmol/L (ref 135–145)

## 2023-04-13 LAB — CBC
HCT: 39.5 % (ref 36.0–46.0)
Hemoglobin: 13.3 g/dL (ref 12.0–15.0)
MCH: 31.3 pg (ref 26.0–34.0)
MCHC: 33.7 g/dL (ref 30.0–36.0)
MCV: 92.9 fL (ref 80.0–100.0)
Platelets: 196 10*3/uL (ref 150–400)
RBC: 4.25 MIL/uL (ref 3.87–5.11)
RDW: 12.7 % (ref 11.5–15.5)
WBC: 9 10*3/uL (ref 4.0–10.5)
nRBC: 0 % (ref 0.0–0.2)

## 2023-04-13 MED ORDER — DIVALPROEX SODIUM 125 MG PO DR TAB
125.0000 mg | DELAYED_RELEASE_TABLET | Freq: Every day | ORAL | Status: DC
  Filled 2023-04-13: qty 1

## 2023-04-13 MED ORDER — SODIUM CHLORIDE 0.9 % IV SOLN: INTRAVENOUS | Status: DC

## 2023-04-13 MED ORDER — DIVALPROEX SODIUM 250 MG PO DR TAB
250.0000 mg | DELAYED_RELEASE_TABLET | Freq: Every day | ORAL | Status: DC
  Administered 2023-04-14 – 2023-04-15 (×2): 250 mg via ORAL
  Filled 2023-04-13 (×2): qty 1

## 2023-04-13 MED ORDER — ESCITALOPRAM OXALATE 10 MG PO TABS
10.0000 mg | ORAL_TABLET | Freq: Every day | ORAL | Status: DC
  Administered 2023-04-13 – 2023-04-17 (×5): 10 mg via ORAL
  Filled 2023-04-13 (×5): qty 1

## 2023-04-13 MED ORDER — BUSPIRONE HCL 5 MG PO TABS: 15.0000 mg | ORAL_TABLET | Freq: Two times a day (BID) | ORAL | Status: DC | PRN

## 2023-04-13 MED ORDER — ONDANSETRON HCL 4 MG PO TABS: 4.0000 mg | ORAL_TABLET | Freq: Four times a day (QID) | ORAL | Status: DC | PRN

## 2023-04-13 MED ORDER — NITROGLYCERIN 0.4 MG SL SUBL: 0.4000 mg | SUBLINGUAL_TABLET | SUBLINGUAL | Status: DC | PRN

## 2023-04-13 MED ORDER — ACETAMINOPHEN 325 MG PO TABS
650.0000 mg | ORAL_TABLET | Freq: Four times a day (QID) | ORAL | Status: DC | PRN
  Administered 2023-04-13: 650 mg via ORAL
  Filled 2023-04-13: qty 2

## 2023-04-13 MED ORDER — HALOPERIDOL LACTATE 5 MG/ML IJ SOLN
1.0000 mg | Freq: Four times a day (QID) | INTRAMUSCULAR | Status: DC | PRN
  Administered 2023-04-13 – 2023-04-14 (×2): 1 mg via INTRAMUSCULAR
  Filled 2023-04-13 (×2): qty 1

## 2023-04-13 MED ORDER — ACETAMINOPHEN 500 MG PO TABS
500.0000 mg | ORAL_TABLET | Freq: Three times a day (TID) | ORAL | Status: DC
  Administered 2023-04-13 – 2023-04-17 (×11): 500 mg via ORAL
  Filled 2023-04-13 (×11): qty 1

## 2023-04-13 MED ORDER — MEMANTINE HCL 10 MG PO TABS
10.0000 mg | ORAL_TABLET | Freq: Every day | ORAL | Status: DC
  Administered 2023-04-14 – 2023-04-17 (×4): 10 mg via ORAL
  Filled 2023-04-13 (×5): qty 1

## 2023-04-13 MED ORDER — TRAZODONE HCL 50 MG PO TABS: 25.0000 mg | ORAL_TABLET | Freq: Every evening | ORAL | Status: DC | PRN

## 2023-04-13 MED ORDER — METOPROLOL SUCCINATE ER 25 MG PO TB24
25.0000 mg | ORAL_TABLET | Freq: Every day | ORAL | Status: DC
  Administered 2023-04-14 – 2023-04-17 (×4): 25 mg via ORAL
  Filled 2023-04-13 (×5): qty 1

## 2023-04-13 MED ORDER — ACETAMINOPHEN 650 MG RE SUPP: 650.0000 mg | Freq: Four times a day (QID) | RECTAL | Status: DC | PRN

## 2023-04-13 MED ORDER — OYSTER SHELL CALCIUM/D3 500-5 MG-MCG PO TABS
1.0000 | ORAL_TABLET | Freq: Every day | ORAL | Status: DC
  Administered 2023-04-13 – 2023-04-17 (×4): 1 via ORAL
  Filled 2023-04-13 (×5): qty 1

## 2023-04-13 MED ORDER — MELATONIN 3 MG PO TABS: 3.0000 mg | ORAL_TABLET | Freq: Every evening | ORAL | Status: DC | PRN

## 2023-04-13 MED ORDER — ALPRAZOLAM 0.25 MG PO TABS
0.2500 mg | ORAL_TABLET | Freq: Three times a day (TID) | ORAL | Status: DC | PRN
  Administered 2023-04-13 – 2023-04-14 (×4): 0.25 mg via ORAL
  Filled 2023-04-13 (×4): qty 1

## 2023-04-13 MED ORDER — DONEPEZIL HCL 10 MG PO TABS
10.0000 mg | ORAL_TABLET | Freq: Every day | ORAL | Status: DC
  Administered 2023-04-13 – 2023-04-17 (×5): 10 mg via ORAL
  Filled 2023-04-13 (×5): qty 1

## 2023-04-13 MED ORDER — MAGNESIUM HYDROXIDE 400 MG/5ML PO SUSP: 30.0000 mL | Freq: Every day | ORAL | Status: DC | PRN

## 2023-04-13 MED ORDER — ONDANSETRON HCL 4 MG/2ML IJ SOLN: 4.0000 mg | Freq: Four times a day (QID) | INTRAMUSCULAR | Status: DC | PRN

## 2023-04-13 NOTE — TOC Initial Note (Signed)
Transition of Care [facility]) - Initial/Assessment Note    Patient Details  Name: Nancy [name] MRN: [id number] Date of Birth: 04/23/1946  Transition of Care [facility]) CM/SW Contact:    Lawerance Sabal, RN Phone Number: 04/13/2023, 1:21 PM  Clinical Narrative:                  Patient admitted from [facility] after fall and L1 Fx.  PT pending.  CSW aware of facility admission, will continue to follow for DC needs.  Expected Discharge Plan: Memory Care Barriers to Discharge: Continued Medical Work up   Patient Goals and CMS Choice            Expected Discharge Plan and Services In-house Referral: Clinical Social Work                                            Prior Living Arrangements/Services                       Activities of Daily Living      Permission Sought/Granted                  Emotional Assessment              Admission diagnosis:  Lumbar vertebral fracture (HCC) [S32.009A] Closed fracture of first lumbar vertebra, unspecified fracture morphology, initial encounter (HCC) [S32.019A] Patient Active Problem List   Diagnosis Date Noted   Dementia with behavioral disturbance (HCC) 04/13/2023   Essential hypertension 04/13/2023   Dyslipidemia 04/13/2023   Closed fracture of first lumbar vertebra (HCC) 04/13/2023   Pain of right middle finger 09/18/2020   Pain of left middle finger 09/18/2020   Late onset Alzheimer's disease with behavioral disturbance (HCC) 07/02/2020   Mild neurocognitive disorder 07/19/2019   Osteoporosis    Chest pain, unspecified 08/27/2009   Hyperlipidemia 08/01/2009   Essential hypertension, benign 08/01/2009   Coronary artery disease involving native coronary artery of native heart without angina pectoris 08/01/2009   PCP:  System, Provider Not In Pharmacy:   [facility] - [place], [place] - [address] [place]  [address] Phone: [phone] Fax: [phone]     Social Determinants of Health (SDOH) Social History: SDOH Screenings   Alcohol Screen: Low Risk  (08/02/2018)  Depression (PHQ2-9): Low Risk  (02/20/2020)  Tobacco Use: Low Risk  (01/18/2023)   SDOH Interventions:     Readmission Risk Interventions     No data to display

## 2023-04-13 NOTE — Assessment & Plan Note (Signed)
-   We will continue as needed sublingual nitroglycerin, Toprol-XL and aspirin.

## 2023-04-13 NOTE — Assessment & Plan Note (Signed)
-   This is secondary to mechanical fall. - The patient will be admitted to the medical observation bed. - Pain management will be provided. - EDP obtained phone consultation with neurosurgery was obtained the recommendation was for lumbosacral brace and follow-up in 2 weeks on outpatient basis. - PT consult will be obtained to assist with ambulation. - Will follow neurochecks given her head injury.

## 2023-04-13 NOTE — Progress Notes (Signed)
Orthopedic Tech Progress Note Patient Details:  Nancy [name] 1946/06/28 [id number] At the time we came to fit patient with LSO BRACE, someone was doing something with the patient. Dropped it off with family at bedside    Ortho Devices Type of Ortho Device: Lumbar corsett Ortho Device/Splint Location: BACK Ortho Device/Splint Interventions: Ordered, Adjustment, Other (comment)   Post Interventions Patient Tolerated: Fair Instructions Provided: Care of device  Donald Pore 04/13/2023, 12:45 PM

## 2023-04-13 NOTE — Evaluation (Signed)
Physical Therapy Evaluation Patient Details Name: Nancy [name] MRN: [id number] DOB: 14-Oct-1946 Today's Date: 04/13/2023  History of Present Illness  Pt is a 77 year old female admitted on 04/12/23 after a fall at her ALF resulting in a fracture to L1. Past medical history significant for coronary artery disease, dyslipidemia, hypertension and osteoporosis as well as dementia. She has been having behavioral changes with her dementia.  Today she was kicking and spitting and fell hitting her head.  Over the last couple weeks she has been having worsening behavioral disturbances with her dementia.  Clinical Impression  Pt presents with admitting diagnosis above. Session today very limited due to pain and cognitive deficits as pt could not come fully upright during bed mobility. Pt required Max A for rolling. Recommend SNF at DC. PT will continue to follow.       Recommendations for follow up therapy are one component of a multi-disciplinary discharge planning process, led by the attending physician.  Recommendations may be updated based on patient status, additional functional criteria and insurance authorization.  Follow Up Recommendations Can patient physically be transported by private vehicle: No     Assistance Recommended at Discharge Frequent or constant Supervision/Assistance  Patient can return home with the following  Two people to help with walking and/or transfers;A lot of help with bathing/dressing/bathroom;Assistance with cooking/housework;Direct supervision/assist for medications management;Direct supervision/assist for financial management;Assist for transportation;Help with stairs or ramp for entrance    Equipment Recommendations Other (comment) (Per accepting facility)  Recommendations for Other Services       Functional Status Assessment Patient has had a recent decline in their functional status and demonstrates the ability to make significant improvements in function in a  reasonable and predictable amount of time.     Precautions / Restrictions Precautions Precautions: Fall Restrictions Weight Bearing Restrictions: No      Mobility  Bed Mobility Overal bed mobility: Needs Assistance Bed Mobility: Rolling Rolling: Max assist         General bed mobility comments: Pt unable to come fully upright due to pain and cognitive deficits.    Transfers                   General transfer comment: Pt unable    Ambulation/Gait               General Gait Details: pt unable  Stairs            Wheelchair Mobility    Modified Rankin (Stroke Patients Only)       Balance Overall balance assessment: History of Falls                                           Pertinent Vitals/Pain Pain Assessment Pain Assessment: Faces Faces Pain Scale: Hurts whole lot Pain Location: "all over" Pain Descriptors / Indicators: Grimacing, Discomfort, Guarding, Moaning Pain Intervention(s): Monitored during session, Limited activity within patient's tolerance    Home Living Family/patient expects to be discharged to:: Assisted living                 Home Equipment: Other (comment) (pt unsure) Additional Comments: pt very poor historian. Per chart pt came from ALF memory ward    Prior Function Prior Level of Function : Patient poor historian/Family not available             Mobility Comments:  Pt states that she walked with her feet and did not use any AD ADLs Comments: Pt reports ind     Hand Dominance   Dominant Hand: Right    Extremity/Trunk Assessment   Upper Extremity Assessment Upper Extremity Assessment: Difficult to assess due to impaired cognition    Lower Extremity Assessment Lower Extremity Assessment: Difficult to assess due to impaired cognition    Cervical / Trunk Assessment Cervical / Trunk Assessment: Other exceptions Cervical / Trunk Exceptions: Difficult to assess due to impaired  cognition.  Communication   Communication: No difficulties  Cognition Arousal/Alertness: Awake/alert Behavior During Therapy: Flat affect Overall Cognitive Status: History of cognitive impairments - at baseline                                 General Comments: Pt with history of severe dementia. Pt perseverating on pain and not knowing why it hurts or where she is at. Pt reports that we are in the 1970s. Pt only oriented to self.        General Comments General comments (skin integrity, edema, etc.): VSS on RA    Exercises     Assessment/Plan    PT Assessment Patient needs continued PT services  PT Problem List Decreased strength;Decreased range of motion;Decreased activity tolerance;Decreased balance;Decreased mobility;Decreased coordination;Decreased cognition;Decreased knowledge of use of DME;Decreased safety awareness;Decreased knowledge of precautions;Pain       PT Treatment Interventions DME instruction;Gait training;Stair training;Therapeutic activities;Functional mobility training;Therapeutic exercise;Balance training;Neuromuscular re-education;Patient/family education;Cognitive remediation    PT Goals (Current goals can be found in the Care Plan section)  Acute Rehab PT Goals PT Goal Formulation: Patient unable to participate in goal setting Time For Goal Achievement: 04/27/23 Potential to Achieve Goals: Poor    Frequency Min 2X/week     Co-evaluation               AM-PAC PT "6 Clicks" Mobility  Outcome Measure Help needed turning from your back to your side while in a flat bed without using bedrails?: A Lot Help needed moving from lying on your back to sitting on the side of a flat bed without using bedrails?: Total Help needed moving to and from a bed to a chair (including a wheelchair)?: Total Help needed standing up from a chair using your arms (e.g., wheelchair or bedside chair)?: Total Help needed to walk in hospital room?: Total Help  needed climbing 3-5 steps with a railing? : Total 6 Click Score: 7    End of Session   Activity Tolerance: Patient limited by pain Patient left: in bed;with call bell/phone within reach;with bed alarm set Nurse Communication: Mobility status PT Visit Diagnosis: Other abnormalities of gait and mobility (R26.89);History of falling (Z91.81);Repeated falls (R29.6)    Time: 1478-2956 PT Time Calculation (min) (ACUTE ONLY): 21 min   Charges:   PT Evaluation $PT Eval Moderate Complexity: 1 Mod          Nasire Reali B, PT, DPT Acute Rehab Services [phone]   Gladys Damme 04/13/2023, 2:03 PM

## 2023-04-13 NOTE — Progress Notes (Signed)
Asked attending if she would benefit from a foley cath, because patient refuses to use bedpan or to get out of the bed with PT. Patient has a hard time understanding that the external cath works. Patient has held her urine all morning into afternoon.

## 2023-04-13 NOTE — [place]2 (Signed)
[place] MEDICA[place] FL2 LEVEL OF CARE FORM     IDENTIFICATION  Patient Name: Nancy [name] Birthdate: March 26, 1946 Sex: female Admission Date (Current Location): 04/12/2023  [facility] and [place] Number:  Producer, television/film/video and Address:  [facility], [address], [place], [place] [address]      Provider Number: [id number]  Attending Physician Name and Address:  Rodolph Bong, MD  Relative Name and Phone Number:  Alexis Goodell (Daughter)  [phone] [facility])    Current Level of Care: Hospital Recommended Level of Care: Skilled Nursing Facility Prior Approval Number:    Date Approved/Denied:   PASRR Number: [id number]  Discharge Plan: SNF    Current Diagnoses: Patient Active Problem List   Diagnosis Date Noted   Dementia with behavioral disturbance (HCC) 04/13/2023   Essential hypertension 04/13/2023   Dyslipidemia 04/13/2023   Closed fracture of first lumbar vertebra (HCC) 04/13/2023   Pain of right middle finger 09/18/2020   Pain of left middle finger 09/18/2020   Late onset Alzheimer's disease with behavioral disturbance (HCC) 07/02/2020   Mild neurocognitive disorder 07/19/2019   Osteoporosis    Chest pain, unspecified 08/27/2009   Hyperlipidemia 08/01/2009   Essential hypertension, benign 08/01/2009   Coronary artery disease involving native coronary artery of native heart without angina pectoris 08/01/2009    Orientation RESPIRATION BLADDER Height & Weight     Self  Normal Incontinent Weight:   Height:     BEHAVIORAL SYMPTOMS/MOOD NEUROLOGICAL BOWEL NUTRITION STATUS      Continent Diet (see d/c summary)  AMBULATORY STATUS COMMUNICATION OF NEEDS Skin   Extensive Assist   Normal                       Personal Care Assistance Level of Assistance  Bathing, Feeding, Dressing Bathing Assistance: Maximum assistance Feeding assistance: Independent Dressing Assistance: Maximum assistance     Functional Limitations  Info  Sight, Hearing, Speech Sight Info: Adequate Hearing Info: Impaired Speech Info: Adequate    SPECIAL CARE FACTORS FREQUENCY  PT (By licensed PT), OT (By licensed OT)     PT Frequency: 5x/week OT Frequency: 5x/week            Contractures Contractures Info: Not present    Additional Factors Info  Code Status, Allergies Code Status Info: DNR Allergies Info: Prednisone, sulfa antibiotics, morphine, codeine, Sulfasalazine, Sulfonamide Derivatives, Alendronate, Atorvastatin, Bactrim (sulfamethoxazole-trimethoprim), Buprenorphine Hcl           Current Medications (04/13/2023):  This is the current hospital active medication list Current Facility-Administered Medications  Medication Dose Route Frequency Provider Last Rate Last Admin   0.9 %  sodium chloride infusion   Intravenous Continuous Mansy, Vernetta Honey, MD 75 mL/hr at 04/13/23 1426 New Bag at 04/13/23 1426   acetaminophen (TYLENOL) tablet 650 mg  650 mg Oral Q6H PRN Mansy, Jan A, MD   650 mg at 04/13/23 1660   Or   acetaminophen (TYLENOL) suppository 650 mg  650 mg Rectal Q6H PRN Mansy, Jan A, MD       acetaminophen (TYLENOL) tablet 500 mg  500 mg Oral TID Rodolph Bong, MD   500 mg at 04/13/23 0855   ALPRAZolam Prudy Feeler) tablet 0.25 mg  0.25 mg Oral TID PRN Mansy, Jan A, MD   0.25 mg at 04/13/23 0854   busPIRone (BUSPAR) tablet 15 mg  15 mg Oral BID PRN Mansy, Vernetta Honey, MD       calcium-vitamin D (  OSCAL WITH D) 500-5 MG-MCG per tablet 1 tablet  1 tablet Oral Daily Mansy, Jan A, MD   1 tablet at 04/13/23 0855   divalproex (DEPAKOTE) DR tablet 125 mg  125 mg Oral Daily Mansy, Jan A, MD       donepezil (ARICEPT) tablet 10 mg  10 mg Oral Daily Mansy, Jan A, MD   10 mg at 04/13/23 0856   escitalopram (LEXAPRO) tablet 10 mg  10 mg Oral Daily Mansy, Jan A, MD   10 mg at 04/13/23 4403   haloperidol lactate (HALDOL) injection 1 mg  1 mg Intramuscular Q6H PRN Mansy, Jan A, MD   1 mg at 04/13/23 1255   magnesium hydroxide (MILK OF  MAGNESIA) suspension 30 mL  30 mL Oral Daily PRN Mansy, Jan A, MD       melatonin tablet 3 mg  3 mg Oral QHS PRN Mansy, Jan A, MD       memantine [facility]) tablet 10 mg  10 mg Oral Daily Mansy, Jan A, MD       metoprolol succinate (TOPROL-XL) 24 hr tablet 25 mg  25 mg Oral Daily Mansy, Jan A, MD       nitroGLYCERIN (NITROSTAT) SL tablet 0.4 mg  0.4 mg Sublingual Q5 min PRN Mansy, Jan A, MD       ondansetron [facility]) tablet 4 mg  4 mg Oral Q6H PRN Mansy, Jan A, MD       Or   ondansetron [facility]) injection 4 mg  4 mg Intravenous Q6H PRN Mansy, Jan A, MD       traZODone (DESYREL) tablet 25 mg  25 mg Oral QHS PRN Mansy, Vernetta Honey, MD         Discharge Medications: Please see discharge summary for a list of discharge medications.  Relevant Imaging Results:  Relevant Lab Results:   Additional Information SSN [id number] [place], [place]

## 2023-04-13 NOTE — TOC Progression Note (Signed)
Transition of Care [facility]) - Progression Note    Patient Details  Name: Nancy [name] MRN: [id number] Date of Birth: 1946/04/04  [facility]) CM/SW Contact  Erin Sons, [place] Phone Number: 04/13/2023, 3:56 PM  Clinical Narrative:     CSW called daughter Rinaldo Cloud to discuss SNF rec. Pt from [facility]. She is currently [facility]. Daughter agreeable to SNF workup. CSW explained workup process and insurance coverage. Fl2 completed and bed requests have been sent in hub.    Expected Discharge Plan: Skilled Nursing Facility Barriers to Discharge: Continued Medical Work up, SNF Pending bed offer  Expected Discharge Plan and Services In-house Referral: Clinical Social Work     Living arrangements for the past 2 months: Assisted Living Facility                                       Social Determinants of Health (SDOH) Interventions SDOH Screenings   Alcohol Screen: Low Risk  (08/02/2018)  Depression (PHQ2-9): Low Risk  (02/20/2020)  Tobacco Use: Low Risk  (01/18/2023)    Readmission Risk Interventions     No data to display

## 2023-04-13 NOTE — Assessment & Plan Note (Addendum)
-   We will place on as needed IM Haldol. - We will continue Aricept, Namenda and Depakote as well as Lexapro and BuSpar. - Will make Xanax as needed

## 2023-04-13 NOTE — Assessment & Plan Note (Signed)
-   She is on Repatha subcutaneous injections.

## 2023-04-13 NOTE — H&P (Addendum)
[place]   PATIENT NAME: Nancy [name]    MR#:  [id number]  DATE OF BIRTH:  [birth year]  DATE OF ADMISSION:  04/12/2023  PRIMARY CARE PHYSICIAN: System, Provider Not In   Patient is coming from: [name] (memory ward).  REQUESTING/REFERRING PHYSICIAN: Pricilla Loveless, MD  CHIEF COMPLAINT:   Chief Complaint  Patient presents with   Fall    HISTORY OF PRESENT ILLNESS:  Nancy [name] is a 77 y.o. Caucasian female with medical history significant for coronary artery disease, dyslipidemia, hypertension and osteoporosis as well as dementia who presented to the emergency room with acute onset of fall and subsequent back pain.  She has been having behavioral changes with her dementia.  Today she was kicking and spitting and fell hitting her head.  Over the last couple weeks she has been having worsening behavioral disturbances with her dementia.  Her Xanax was increased up to 3 times a day and that has not helped.  She was also added BuSpar on 6/18.  She had nausea and vomiting once in the emergency room.  No fever or chills.  No focal muscle weakness or paresthesias.  No diarrhea or melena or bright red blood per rectum.  She did not have any bilious vomitus or hematemesis.  No chest pain or palpitations.  No cough or wheezing or dyspnea.  ED Course: When she came to the ER, BP was 142/103 with respiratory to 24 and heart rate of 55 with otherwise a creatinine of 1.13 with otherwise unremarkable CMP.  CBC was within normal.  UA showed trace bacteria with only 0-5 WBCs and negative nitrites.  Normal vital signs.  Labs revealed EKG as reviewed by me : EKG showed sinus rhythm with a rate of 61 with PACs and borderline right axis deviation Imaging: Noncontrasted head CT scan revealed no acute intracranial abnormalities and C-spine CT showed no acute findings.  Lumbar CT spine without contrast revealed the following: 1. Acute transverse fracture through the L1 vertebral body. 2.  Indeterminate, likely chronic, nondisplaced cortical fracture of the ventral wall of the L2 vertebral body. 3. Chronic vertebral body height loss at the L3 level with associated inferior endplate Schmorl node. 4. Diffusely decreased bone density. 5. Other imaging findings of potential clinical significance: Colonic diverticulosis. Aortic Atherosclerosis (ICD10-I70.0).  The patient was given 50 mcg of IV fentanyl as well as IV Zofran but was still unable to get up from bed due to pain.  She will be admitted to observation medical bed for further evaluation and management. PAST MEDICAL HISTORY:   Past Medical History:  Diagnosis Date   Coronary atherosclerosis of native coronary artery    Dyslipidemia    Essential hypertension, benign    History of colonic polyps    Osteoporosis    Subdural hematoma (HCC) 04/27/2012    PAST SURGICAL HISTORY:   Past Surgical History:  Procedure Laterality Date   CORONARY ANGIOPLASTY WITH STENT PLACEMENT  2010   3 times   CORONARY ARTERY BYPASS GRAFT  2009   CRANIOTOMY  04/27/2012   Procedure: CRANIOTOMY HEMATOMA EVACUATION SUBDURAL;  Surgeon: Tia Alert, MD;  Location: [facility];  Service: Neurosurgery;  Laterality: Right;  Right Frontal Burr hole for Subdural Hematoma   NASAL SINUS SURGERY     POLYPECTOMY      SOCIAL HISTORY:   Social History   Tobacco Use   Smoking status: Never   Smokeless tobacco: Never  Substance Use Topics  Alcohol use: Yes    Comment: occasional glass wine    FAMILY HISTORY:   Family History  Problem Relation Age of Onset   Hypertension Mother    Cancer Mother        Colon   Kidney disease Mother    Memory loss Mother    Heart attack Father    Hypertension Brother    Stroke Maternal Grandmother    Memory loss Maternal Grandmother    Cancer Maternal Grandfather        Intestinal    DRUG ALLERGIES:   Allergies  Allergen Reactions   Alendronate Shortness Of Breath and Swelling   Atorvastatin  Other (See Comments)    Muscle cramps   Bactrim [Sulfamethoxazole-Trimethoprim] Other (See Comments)    Blisters/skin peeling, nausea   Buprenorphine Hcl Nausea Only   Morphine Nausea Only    OTHER   Morphine And Codeine Nausea Only    Morphine only   Prednisone Other (See Comments)    Unknown   Sulfa Antibiotics Itching    Blisters/skin peeling   Sulfasalazine Itching    Blisters/skin peeling   Sulfonamide Derivatives Other (See Comments)    Blisters/skin peeling    REVIEW OF SYSTEMS:   ROS As per history of present illness. All pertinent systems were reviewed above. Constitutional, HEENT, cardiovascular, respiratory, GI, GU, musculoskeletal, neuro, psychiatric, endocrine, integumentary and hematologic systems were reviewed and are otherwise negative/unremarkable except for positive findings mentioned above in the HPI.   MEDICATIONS AT HOME:   Prior to Admission medications   Medication Sig Start Date End Date Taking? Authorizing Provider  acetaminophen (TYLENOL) 500 MG tablet Take 500 mg by mouth every 8 (eight) hours as needed for mild pain or fever.   Yes [provider]  ALPRAZolam (XANAX) 0.25 MG tablet Take 0.25 mg by mouth in the morning, at noon, and at bedtime. May also give 1 tablet twice a day as needed for anxiety   Yes [provider]  aspirin 81 MG tablet Take 81 mg by mouth daily.   Yes [provider]  busPIRone (BUSPAR) 15 MG tablet Take 15 mg by mouth 2 (two) times daily as needed (for anxiety). 04/06/23  Yes [provider]  Calcium Carbonate-Vit D-Min (CALCIUM 600+D3 PLUS MINERALS) 600-800 MG-UNIT TABS Take 1 tablet by mouth daily.   Yes [provider]  divalproex (DEPAKOTE) 125 MG DR tablet Take 125 mg by mouth daily. 04/07/23  Yes [provider]  donepezil (ARICEPT) 10 MG tablet Take 1 tablet (10 mg total) by mouth daily. 10/06/22  Yes Marcos Eke, PA-C  escitalopram (LEXAPRO) 10 MG tablet Take 10 mg  by mouth daily. Pt takes 1 tablet daily.   Yes [provider]  Evolocumab (REPATHA SURECLICK) 140 MG/ML SOAJ INJECT 1 PEN SUBCUTANEOUSLY EVERY TWO WEEKS (EVERY 14(FOURTEEN) DAYS) Patient taking differently: Inject 140 mg into the skin every 14 (fourteen) days. Given by [facility] every other Monday 01/25/23  Yes Nahser, Deloris Ping, MD  melatonin 3 MG TABS tablet Take 3 mg by mouth at bedtime. Pt takes 1 tablet at night.   Yes [provider]  memantine (NAMENDA) 10 MG tablet TAKE 1 TABLET TWICE  A  DAY Patient taking differently: Take 10 mg by mouth daily. 10/06/22  Yes Marcos Eke, PA-C  metoprolol succinate (TOPROL-XL) 25 MG 24 hr tablet Take 1 tablet by mouth once daily Patient taking differently: Take 25 mg by mouth daily. 03/10/22  Yes Nahser, Deloris Ping, MD  nitroGLYCERIN (NITROSTAT) 0.4 MG SL tablet Place 1 tablet (0.4 mg total) under the tongue every 5 (five) minutes as needed for chest pain. Please make overdue appt with Dr. Elease Hashimoto before anymore refills. Thank you 1st attempt 01/18/23  Yes Nahser, Deloris Ping, MD      VITAL SIGNS:  Blood pressure (!) 144/50, pulse (!) 59, temperature 97.7 F (36.5 C), temperature source Oral, resp. rate 16, SpO2 97 %.  PHYSICAL EXAMINATION:  Physical Exam  GENERAL:  77 y.o.-year-old Caucasian female patient lying in the bed with no acute distress.  EYES: Pupils equal, round, reactive to light and accommodation. No scleral icterus. Extraocular muscles intact.  HEENT: Head atraumatic, normocephalic. Oropharynx and nasopharynx clear.  NECK:  Supple, no jugular venous distention. No thyroid enlargement, no tenderness.  LUNGS: Normal breath sounds bilaterally, no wheezing, rales,rhonchi or crepitation. No use of accessory muscles of respiration.  CARDIOVASCULAR: Regular rate and rhythm, S1, S2 normal. No murmurs, rubs, or gallops.  ABDOMEN: Soft, nondistended, nontender. Bowel sounds present. No organomegaly or mass.  EXTREMITIES: No pedal  edema, cyanosis, or clubbing.  NEUROLOGIC: Cranial nerves II through XII are intact. Muscle strength 5/5 in all extremities. Sensation intact. Gait not checked. Musculoskeletal: Mid back tenderness around L1 level. PSYCHIATRIC: The patient is alert and cooperative.  Normal affect and good eye contact. SKIN: No obvious rash, lesion, or ulcer.   LABORATORY PANEL:   CBC Recent Labs  Lab 04/12/23 1535  WBC 6.3  HGB 13.3  HCT 40.3  PLT 216   ------------------------------------------------------------------------------------------------------------------  Chemistries  Recent Labs  Lab 04/12/23 1535  NA 139  K 4.1  CL 105  CO2 25  GLUCOSE 105*  BUN 15  CREATININE 1.13*  CALCIUM 9.1  AST 35  ALT 31  ALKPHOS 52  BILITOT 0.5   ------------------------------------------------------------------------------------------------------------------  Cardiac Enzymes No results for input(s): "TROPONINI" in the last 168 hours. ------------------------------------------------------------------------------------------------------------------  RADIOLOGY:  CT Lumbar Spine Wo Contrast  Result Date: 04/12/2023 CLINICAL DATA:  Back trauma, no prior imaging (Age >= 16y) EXAM: CT LUMBAR SPINE WITHOUT CONTRAST TECHNIQUE: Multidetector CT imaging of the lumbar spine was performed without intravenous contrast administration. Multiplanar CT image reconstructions were also generated. RADIATION DOSE REDUCTION: This exam was performed according to the departmental dose-optimization program which includes automated exposure control, adjustment of the mA and/or kV according to patient size and/or use of iterative reconstruction technique. COMPARISON:  X-ray lumbar spine 04/12/2023, MRI lumbar spine 11/07/14, CT abdomen pelvis 10/31/2017 FINDINGS: Segmentation: 5 lumbar type vertebrae. Alignment: Grade 1 anterolisthesis of L3 on L4. Vertebrae: Diffusely decreased bone density. Left L5-S1 pseudoarthrosis. Chronic  mild vertebral body height loss at the L3 level with associated inferior endplate Schmorl node. Acute transverse fracture through the L1 vertebral body. Indeterminate, likely chronic, nondisplaced cortical fracture of the ventral wall of the L2 vertebral body. Paraspinal and other soft tissues: Negative. Disc levels: Multilevel intervertebral disc space vacuum phenomenon. Other: Atherosclerotic plaque.  Colonic diverticulosis. IMPRESSION: 1. Acute transverse fracture through the L1 vertebral body. 2. Indeterminate, likely chronic, nondisplaced cortical fracture of the ventral wall of the L2 vertebral body. 3. Chronic vertebral body height loss at the L3 level with associated inferior endplate Schmorl node. 4. Diffusely decreased bone density. 5. Other imaging findings of potential clinical significance: Colonic diverticulosis. Aortic Atherosclerosis (ICD10-I70.0). Electronically Signed   By: Tish Frederickson M.D.   On: 04/12/2023 19:27   DG Pelvis 1-2 Views  Result Date: 04/12/2023 CLINICAL DATA:  Trauma, fall EXAM: PELVIS - 1-2 VIEW COMPARISON:  None Available. FINDINGS: No displaced fracture is seen. Joint spaces in both hips appear symmetrical. SI joints are unremarkable. Degenerative changes are noted in the visualized lower lumbar spine. IMPRESSION: No recent fracture is seen in pelvis. Electronically Signed   By: Ernie Avena M.D.   On: 04/12/2023 16:40   DG Lumbar Spine Complete  Result Date: 04/12/2023 CLINICAL DATA:  Trauma, fall EXAM: LUMBAR SPINE - COMPLETE 4+ VIEW COMPARISON:  MRI lumbar spine done on 11/07/2014 FINDINGS: There is decrease in height of the body of the L3 vertebra which has not changed since 11/07/2014. These mild decrease in height of upper endplate of body the L2 vertebra which may suggest recent or old mild compression. Degenerative changes are noted with disc space narrowing and bony spurs at multiple levels, more so at L4-L5 and L5-S1 levels. Osteopenia is seen in bony  structures. Calcifications are noted in aorta and its major branches. IMPRESSION: There is mild 10-20% decrease in height of body of L2 vertebra which was not seen in the previous MRI done on 11/07/2014. This may suggest recent or old mild compression fracture. If there are focal symptoms, follow-up CT or MRI may be considered. Old compression fracture in the body of L3 vertebra has not changed since 11/07/2014. Lumbar spondylosis, more severe at L4-L5 and L5-S1 levels. Arteriosclerosis. Electronically Signed   By: Ernie Avena M.D.   On: 04/12/2023 16:39   DG Hand Complete Left  Result Date: 04/12/2023 CLINICAL DATA:  Trauma, fall EXAM: LEFT HAND - COMPLETE 3+ VIEW COMPARISON:  None Available. FINDINGS: No displaced fracture or dislocation is seen. There is a monitoring device partly obscuring the middle and distal phalanges of the index finger. There is metallic ring partly obscuring the proximal phalanx of ring finger. Marked degenerative changes are noted in first metatarsophalangeal joint. Chondrocalcinosis in the left wrist may be due to degenerative arthritis. Bony spurs are seen in interphalangeal joints. IMPRESSION: No recent displaced fracture or dislocation is seen. Degenerative changes are noted in multiple joints, more severe in the first carpometacarpal joint. Other findings as described in the body of the report. Electronically Signed   By: Ernie Avena M.D.   On: 04/12/2023 16:33   DG Chest 1 View  Result Date: 04/12/2023 CLINICAL DATA:  Trauma, fall EXAM: CHEST  1 VIEW COMPARISON:  08/24/2022 FINDINGS: Cardiac size is within normal limits. There is previous coronary bypass surgery. There are no signs of pulmonary edema or focal pulmonary consolidation. Small linear densities in medial left lower lung field may suggest scarring. Low position of the diaphragms suggest possible COPD. IMPRESSION: No active cardiopulmonary disease. Electronically Signed   By: Ernie Avena  M.D.   On: 04/12/2023 16:32   CT Head Wo Contrast  Result Date: 04/12/2023 CLINICAL DATA:  Fall EXAM: CT HEAD WITHOUT CONTRAST CT CERVICAL SPINE WITHOUT CONTRAST TECHNIQUE: Multidetector CT imaging of the head and cervical spine was performed following the standard protocol without intravenous contrast. Multiplanar CT image reconstructions of the cervical spine were also generated. RADIATION DOSE REDUCTION: This exam was performed according to the departmental dose-optimization program which includes automated exposure control, adjustment of the mA and/or kV according to patient size and/or use of iterative reconstruction technique. COMPARISON:  CT head 08/24/2022, cervical spine CT 01/02/2019 FINDINGS: CT HEAD FINDINGS Brain: There is no acute intracranial hemorrhage, extra-axial fluid collection, or acute infarct. There is mild parenchymal volume loss with prominence of the ventricular system and extra-axial CSF spaces. The ventricles are stable in size  since 2023 with unchanged dilation of the temporal horns. Gray-white differentiation is preserved. Hypodensity in the supratentorial white matter likely reflects sequela of underlying chronic small-vessel ischemic change The pituitary and suprasellar region are normal. There is no mass lesion. There is no mass effect or midline shift. Vascular: There is calcification of the bilateral carotid siphons and vertebral arteries. Skull: A prior right parietal burr hole is noted. There is no calvarial fracture. Sinuses/Orbits: The paranasal sinuses are clear. The globes and orbits are unremarkable. Other: None. CT CERVICAL SPINE FINDINGS Alignment: Grade 1 anterolisthesis of C3 on C4, C4 on C5, and C7 on T1 is unchanged, likely degenerative in nature. There is no jumped or perched facet or other evidence of traumatic malalignment. Skull base and vertebrae: Skull base alignment is maintained. Vertebral body heights are preserved. There is no evidence of acute fracture.  There is no suspicious osseous lesion. Soft tissues and spinal canal: No prevertebral fluid or swelling. No visible canal hematoma. Disc levels: Disc space narrowing degenerative endplate change C5-C6 and C6-C7 is stable. There is no high-grade spinal canal stenosis. Upper chest: The imaged lung apices are clear. Other: None. IMPRESSION: 1. No acute intracranial pathology. 2. No acute fracture or traumatic malalignment of the cervical spine. Electronically Signed   By: Lesia Hausen M.D.   On: 04/12/2023 16:21   CT Cervical Spine Wo Contrast  Result Date: 04/12/2023 CLINICAL DATA:  Fall EXAM: CT HEAD WITHOUT CONTRAST CT CERVICAL SPINE WITHOUT CONTRAST TECHNIQUE: Multidetector CT imaging of the head and cervical spine was performed following the standard protocol without intravenous contrast. Multiplanar CT image reconstructions of the cervical spine were also generated. RADIATION DOSE REDUCTION: This exam was performed according to the departmental dose-optimization program which includes automated exposure control, adjustment of the mA and/or kV according to patient size and/or use of iterative reconstruction technique. COMPARISON:  CT head 08/24/2022, cervical spine CT 01/02/2019 FINDINGS: CT HEAD FINDINGS Brain: There is no acute intracranial hemorrhage, extra-axial fluid collection, or acute infarct. There is mild parenchymal volume loss with prominence of the ventricular system and extra-axial CSF spaces. The ventricles are stable in size since 2023 with unchanged dilation of the temporal horns. Gray-white differentiation is preserved. Hypodensity in the supratentorial white matter likely reflects sequela of underlying chronic small-vessel ischemic change The pituitary and suprasellar region are normal. There is no mass lesion. There is no mass effect or midline shift. Vascular: There is calcification of the bilateral carotid siphons and vertebral arteries. Skull: A prior right parietal burr hole is noted.  There is no calvarial fracture. Sinuses/Orbits: The paranasal sinuses are clear. The globes and orbits are unremarkable. Other: None. CT CERVICAL SPINE FINDINGS Alignment: Grade 1 anterolisthesis of C3 on C4, C4 on C5, and C7 on T1 is unchanged, likely degenerative in nature. There is no jumped or perched facet or other evidence of traumatic malalignment. Skull base and vertebrae: Skull base alignment is maintained. Vertebral body heights are preserved. There is no evidence of acute fracture. There is no suspicious osseous lesion. Soft tissues and spinal canal: No prevertebral fluid or swelling. No visible canal hematoma. Disc levels: Disc space narrowing degenerative endplate change C5-C6 and C6-C7 is stable. There is no high-grade spinal canal stenosis. Upper chest: The imaged lung apices are clear. Other: None. IMPRESSION: 1. No acute intracranial pathology. 2. No acute fracture or traumatic malalignment of the cervical spine. Electronically Signed   By: Lesia Hausen M.D.   On: 04/12/2023 16:21  IMPRESSION AND PLAN:  Assessment and Plan: * Closed fracture of first lumbar vertebra (HCC) - This is secondary to mechanical fall. - The patient will be admitted to the medical observation bed. - Pain management will be provided. - EDP obtained phone consultation with neurosurgery was obtained the recommendation was for lumbosacral brace and follow-up in 2 weeks on outpatient basis. - PT consult will be obtained to assist with ambulation. - Will follow neurochecks given her head injury.  Dementia with behavioral disturbance (HCC) - We will place on as needed IM Haldol. - We will continue Aricept, Namenda and Depakote as well as Lexapro and BuSpar. - Will make Xanax as needed  Essential hypertension - We will can continue Toprol-XL.  Coronary artery disease involving native coronary artery of native heart without angina pectoris - We will continue as needed sublingual nitroglycerin, Toprol-XL  and aspirin.  Dyslipidemia - She is on Repatha subcutaneous injections.   DVT prophylaxis: SCDs.  Medical prophylaxis currently held off given risk of bleeding in the setting of a L1 compression fracture. Advanced Care Planning:  Code Status: The patient is DNR and MRI.  This was discussed with her daughters. Family Communication:  The plan of care was discussed in details with the patient (and family). I answered all questions. The patient agreed to proceed with the above mentioned plan. Further management will depend upon hospital course. Disposition Plan: Back to previous home environment Consults called: none.  All the records are reviewed and case discussed with ED provider.  Status is: Observation   I certify that at the time of admission, it is my clinical judgment that the patient will require  hospital care extending less than 2 midnights.                            Dispo: The patient is from: [name] ([facility]              Anticipated d/c is to: [name] ([facility]              Patient currently is not medically stable to d/c.              Difficult to place patient: No  Hannah Beat M.D on 04/13/2023 at 1:27 AM  [facility]   From 7 PM-7 AM, contact night-coverage [email or web address]  CC: Primary care physician; System, Provider Not In

## 2023-04-13 NOTE — Assessment & Plan Note (Deleted)
-   This is secondary to mechanical fall. - The patient will be admitted to the medical observation bed. - Pain management will be provided. - EDP obtained phone consultation with neurosurgery was obtained the recommendation was for lumbosacral brace and follow-up in 2 weeks on outpatient basis. - PT consult will be obtained to assist with ambulation. - For follow neurochecks given her head injury.

## 2023-04-13 NOTE — Assessment & Plan Note (Signed)
-   We will can continue Toprol-XL.

## 2023-04-13 NOTE — Progress Notes (Signed)
I have seen and assessed patient and agree with Dr.Mansy's assessment and plan. -Patient 77 year old female history of CAD, hyperlipidemia, hypertension, osteoporosis, dementia presented to the ED with acute onset of fall with subsequent back pain.  Patient noted to have been having some behavioral changes with her dementia and while agitated on day of admission patient fell hitting her head.  Patient noted to have been having worsening behavioral disturbances with her dementia and medications being adjusted.  Patient seen in the ED, head CT done with no acute intracranial abnormalities.  CT L-spine done with acute transverse L1 vertebral body fracture.  Per admitting physician EDP spoke with neurosurgery who recommended lumbosacral brace with outpatient follow-up 2 weeks post discharge.  Patient admitted.  PT OT consulted.  Continue Aricept, Namenda, BuSpar, Lexapro Xanax as needed, Haldol as needed.  Will increase Depakote to 250 mg daily to help with behavioral disturbances.  Check a chest x-ray to rule out infectious etiology.  Urinalysis unremarkable.  Supportive care.  No charge.

## 2023-04-14 DIAGNOSIS — M549 Dorsalgia, unspecified: Secondary | ICD-10-CM | POA: Diagnosis present

## 2023-04-14 DIAGNOSIS — Z8719 Personal history of other diseases of the digestive system: Secondary | ICD-10-CM | POA: Diagnosis not present

## 2023-04-14 DIAGNOSIS — Z7982 Long term (current) use of aspirin: Secondary | ICD-10-CM | POA: Diagnosis not present

## 2023-04-14 DIAGNOSIS — M810 Age-related osteoporosis without current pathological fracture: Secondary | ICD-10-CM | POA: Diagnosis present

## 2023-04-14 DIAGNOSIS — F02811 Dementia in other diseases classified elsewhere, unspecified severity, with agitation: Secondary | ICD-10-CM | POA: Diagnosis present

## 2023-04-14 DIAGNOSIS — E8809 Other disorders of plasma-protein metabolism, not elsewhere classified: Secondary | ICD-10-CM | POA: Diagnosis present

## 2023-04-14 DIAGNOSIS — M545 Low back pain, unspecified: Secondary | ICD-10-CM | POA: Diagnosis not present

## 2023-04-14 DIAGNOSIS — F919 Conduct disorder, unspecified: Secondary | ICD-10-CM | POA: Diagnosis present

## 2023-04-14 DIAGNOSIS — R451 Restlessness and agitation: Secondary | ICD-10-CM | POA: Diagnosis present

## 2023-04-14 DIAGNOSIS — F03918 Unspecified dementia, unspecified severity, with other behavioral disturbance: Secondary | ICD-10-CM | POA: Diagnosis not present

## 2023-04-14 DIAGNOSIS — I10 Essential (primary) hypertension: Secondary | ICD-10-CM | POA: Diagnosis present

## 2023-04-14 DIAGNOSIS — I2510 Atherosclerotic heart disease of native coronary artery without angina pectoris: Secondary | ICD-10-CM | POA: Diagnosis present

## 2023-04-14 DIAGNOSIS — F0394 Unspecified dementia, unspecified severity, with anxiety: Secondary | ICD-10-CM | POA: Diagnosis not present

## 2023-04-14 DIAGNOSIS — Z885 Allergy status to narcotic agent status: Secondary | ICD-10-CM | POA: Diagnosis not present

## 2023-04-14 DIAGNOSIS — Z881 Allergy status to other antibiotic agents status: Secondary | ICD-10-CM | POA: Diagnosis not present

## 2023-04-14 DIAGNOSIS — Z79899 Other long term (current) drug therapy: Secondary | ICD-10-CM | POA: Diagnosis not present

## 2023-04-14 DIAGNOSIS — Y92099 Unspecified place in other non-institutional residence as the place of occurrence of the external cause: Secondary | ICD-10-CM | POA: Diagnosis not present

## 2023-04-14 DIAGNOSIS — Z951 Presence of aortocoronary bypass graft: Secondary | ICD-10-CM | POA: Diagnosis not present

## 2023-04-14 DIAGNOSIS — Z888 Allergy status to other drugs, medicaments and biological substances status: Secondary | ICD-10-CM | POA: Diagnosis not present

## 2023-04-14 DIAGNOSIS — S32019D Unspecified fracture of first lumbar vertebra, subsequent encounter for fracture with routine healing: Secondary | ICD-10-CM | POA: Diagnosis not present

## 2023-04-14 DIAGNOSIS — N289 Disorder of kidney and ureter, unspecified: Secondary | ICD-10-CM | POA: Diagnosis present

## 2023-04-14 DIAGNOSIS — Z882 Allergy status to sulfonamides status: Secondary | ICD-10-CM | POA: Diagnosis not present

## 2023-04-14 DIAGNOSIS — G309 Alzheimer's disease, unspecified: Secondary | ICD-10-CM | POA: Diagnosis present

## 2023-04-14 DIAGNOSIS — F0284 Dementia in other diseases classified elsewhere, unspecified severity, with anxiety: Secondary | ICD-10-CM | POA: Diagnosis present

## 2023-04-14 DIAGNOSIS — S0990XA Unspecified injury of head, initial encounter: Secondary | ICD-10-CM | POA: Diagnosis present

## 2023-04-14 DIAGNOSIS — W1830XA Fall on same level, unspecified, initial encounter: Secondary | ICD-10-CM | POA: Diagnosis present

## 2023-04-14 DIAGNOSIS — F02818 Dementia in other diseases classified elsewhere, unspecified severity, with other behavioral disturbance: Secondary | ICD-10-CM | POA: Diagnosis present

## 2023-04-14 DIAGNOSIS — E785 Hyperlipidemia, unspecified: Secondary | ICD-10-CM | POA: Diagnosis present

## 2023-04-14 DIAGNOSIS — R739 Hyperglycemia, unspecified: Secondary | ICD-10-CM | POA: Diagnosis present

## 2023-04-14 DIAGNOSIS — S32019A Unspecified fracture of first lumbar vertebra, initial encounter for closed fracture: Secondary | ICD-10-CM | POA: Diagnosis present

## 2023-04-14 DIAGNOSIS — Z66 Do not resuscitate: Secondary | ICD-10-CM | POA: Diagnosis present

## 2023-04-14 LAB — BASIC METABOLIC PANEL
Anion gap: 10 (ref 5–15)
BUN: 7 mg/dL — ABNORMAL LOW (ref 8–23)
CO2: 24 mmol/L (ref 22–32)
Calcium: 8.3 mg/dL — ABNORMAL LOW (ref 8.9–10.3)
Chloride: 102 mmol/L (ref 98–111)
Creatinine, Ser: 0.89 mg/dL (ref 0.44–1.00)
GFR, Estimated: >60 mL/min (ref >60)
Glucose, Bld: 101 mg/dL — ABNORMAL HIGH (ref 70–99)
Potassium: 3.6 mmol/L (ref 3.5–5.1)
Sodium: 136 mmol/L (ref 135–145)

## 2023-04-14 LAB — CBC
HCT: 43 % (ref 36.0–46.0)
Hemoglobin: 14.5 g/dL (ref 12.0–15.0)
MCH: 31.8 pg (ref 26.0–34.0)
MCHC: 33.7 g/dL (ref 30.0–36.0)
MCV: 94.3 fL (ref 80.0–100.0)
Platelets: 199 10*3/uL (ref 150–400)
RBC: 4.56 MIL/uL (ref 3.87–5.11)
RDW: 13.1 % (ref 11.5–15.5)
WBC: 8.1 10*3/uL (ref 4.0–10.5)
nRBC: 0 % (ref 0.0–0.2)

## 2023-04-14 LAB — VALPROIC ACID LEVEL: Valproic Acid Lvl: <10 ug/mL — ABNORMAL LOW (ref 50.0–100.0)

## 2023-04-14 NOTE — Hospital Course (Addendum)
The patient is a 77 year old Caucasian female with past medical history significant for 1 to CAD, dyslipidemia hypertension, osteoporosis as well as dementia and behavioral disturbances presented emergency room with acute onset fall with subsequent back pain.  She has been having behavioral changes with her dementia and yesterday she was kicking spitting and fell and hit her head.  Reportedly over the last few weeks she been having worsening behavioral disturbances and her Xanax was increased up to 3 times a day and which has not helped and BuSpar was added on 04/06/2023.  She presented to the ED for a fall and had nausea and vomiting once in the ED but no fevers or chills.  Further workup was done and she had her head scanned which showed no acute intracranial abnormalities and no acute findings on her cervical spine CT but lumbar spine CT did show an acute transverse fracture of L1 vertebral body and indeterminate likely chronic nondisplaced cortical fracture of the ventral wall of the L2 vertebral body.  The EDP spoke with neurosurgery who recommended lumbosacral brace and follow-up in 2 weeks in outpatient basis with no surgical intervention necessary.    PT OT recommending SNF.  Her memory care unit is requesting psychiatric evaluation psychiatry has been consulted.  Her pain appears well-controlled resting and worsens with movement and she is awaiting disposition back to SNF or her memory care unit once cleared. Per TOC conversation the wants the daughter to hire private caregiver for the patient to return and if not an SNF would be an option given that she continues to have significant pain.  Pain is much improved today and she is stable for discharge and patient will be going back to her memory care unit with caregiver support and PT OT.  She is medically stable at this time to follow-up with PCP, neurosurgery and psychiatry in outpatient setting.  Assessment and Plan:  * Closed fracture of first lumbar  vertebra [facility]) Mechanical Fall -Head and Cervical Spine CT done and showed "No acute intracranial pathology. No acute fracture or traumatic malalignment of the cervical spine." -CT Lumbar Spine done and showed " Acute transverse fracture through the L1 vertebral body. Indeterminate, likely chronic, nondisplaced cortical fracture of the ventral wall of the L2 vertebral body. Chronic vertebral body height loss at the L3 level with associated inferior endplate Schmorl node. Diffusely decreased bone density. Other imaging findings of potential clinical significance: Colonic diverticulosis. Aortic Atherosclerosis."  -This is secondary to mechanical fall. -The patient will be admitted to the medical observation bed but was changed to Inpatient after discussion with UR Team -Pain management will be provided. -EDP obtained phone consultation with neurosurgery was obtained the recommendation was for lumbosacral brace and follow-up in 2 weeks on outpatient basis. -PT consult will be obtained to assist with ambulation and recommending SNF -Will follow neurochecks given her head injury. -UA Negative for UTI x-ray done and CXR showed "No evidence of acute airspace opacity, although patient leftward rotation somewhat limits evaluation." -Patient is Afebrile and has no Leukocytosis -She is continuing to have some pain so we will add p.o. Tramadol 50 mg q6hprn and a Lidocaine Patch   Dementia with Behavioral Disturbance (HCC) -We will place on as needed IM Haldol and Psychiatry added a po Option -We will continue Donepezil 10 mg po Daily, Memantine 10 mg po Daily,  -Divalproex 250 mg po Daily was changed to ER 500 mg at bedtime by Psychiatry and now changed to Depakote 125 mg sprinkles 3  times daily; -Continue escitalopram 10 mg po Daily,  -Buspirone 15 mg po BID now stopped by Psychiatry -Benzodiazepines have been stopped -Her PRN trazodone and melatonin were discontinued by Psychiatry (hasn't been taking  this) -Follow-up with psychiatry in outpatient setting   Essential Hypertension -We will can continue Metoprolol Succinate 25 mg po Daily -Continue to Monitor BP per Protocol -Last BP reading was 147/69   Coronary artery disease involving native coronary artery of native heart without angina pectoris -We will continue as needed Sublingual Nitroglycerin 0.4 mg SL q5hminPRN, Toprol-XL as above and aspirin.   Dyslipidemia -She is on Repatha subcutaneous injections.  Hypophosphatemia -Phos Level Trend: Recent Labs  Lab 04/15/23 0427  PHOS 2.4*  -Replete with po K Phos Neutral 500 mg po BID yesterday  Renal Insufficiency, improved -BUN/Cr Trend: Recent Labs  Lab 04/12/23 1535 04/13/23 0231 04/14/23 0108 04/15/23 0427  BUN 15 14 7* 8  CREATININE 1.13* 0.97 0.89 0.82  -Patient was getting IVF with NS at 75 mL/hr but will now discontinue -Avoid Nephrotoxic Medications, Contrast Dyes, Hypotension and Dehydration to Ensure Adequate Renal Perfusion and will need to Renally Adjust Meds -Continue to Monitor and Trend Renal Function carefully and repeat CMP intermittently   Hyperglycemia -Glucose Trend: Recent Labs  Lab 04/12/23 1535 04/13/23 0231 04/14/23 0108 04/15/23 0427  GLUCOSE 105* 148* 101* 104*  -Continue to Monitor and Trend Intermittently   Hypoalbuminemia -Patient's Albumin Trend: Recent Labs  Lab 04/12/23 1535 04/15/23 0427  ALBUMIN 3.7 2.8*  -Continue to Monitor and Trend and repeat CMP intermittently

## 2023-04-14 NOTE — Progress Notes (Signed)
PROGRESS NOTE    Nancy [name]  OZH:[id number] DOB: 06-Mar-1946 DOA: 04/12/2023 PCP: System, Provider Not In   Brief Narrative:  The patient is a 77 year old Caucasian female with past medical history significant for 1 to CAD, dyslipidemia hypertension, osteoporosis as well as dementia and behavioral disturbances presented emergency room with acute onset fall with subsequent back pain.  She has been having behavioral changes with her dementia and yesterday she was kicking spitting and fell and hit her head.  Reportedly over the last few weeks she been having worsening behavioral disturbances and her Xanax was increased up to 3 times a day and which has not helped and BuSpar was added on 04/06/2023.  She presented to the ED for a fall and had nausea and vomiting once in the ED but no fevers or chills.  Further workup was done and she had her head scanned which showed no acute intracranial abnormalities and no acute findings on her cervical spine CT but lumbar spine CT did show an acute transverse fracture of L1 vertebral body and indeterminate likely chronic nondisplaced cortical fracture of the ventral wall of the L2 vertebral body.  The EDP spoke with neurosurgery who recommended lumbosacral brace and follow-up in 2 weeks in outpatient basis with no surgical intervention necessary.  PT OT recommending SNF.  Her anxious memory care unit is requesting psychiatric evaluation psychiatry has been consulted.  Her pain appears well-controlled and she is awaiting disposition back to SNF or her memory care unit once cleared.  Assessment and Plan:  * Closed fracture of first lumbar vertebra [facility]) Mechanical Fall -Head and Cervical Spine CT done and showed "No acute intracranial pathology. No acute fracture or traumatic malalignment of the cervical spine." -CT Lumbar Spine done and showed " Acute transverse fracture through the L1 vertebral body. Indeterminate, likely chronic, nondisplaced cortical fracture of  the ventral wall of the L2 vertebral body. Chronic vertebral body height loss at the L3 level with associated inferior endplate Schmorl node. Diffusely decreased bone density. Other imaging findings of potential clinical significance: Colonic diverticulosis. Aortic Atherosclerosis."  -This is secondary to mechanical fall. -The patient will be admitted to the medical observation bed. -Pain management will be provided. -EDP obtained phone consultation with neurosurgery was obtained the recommendation was for lumbosacral brace and follow-up in 2 weeks on outpatient basis. -PT consult will be obtained to assist with ambulation. -Will follow neurochecks given her head injury. -UA Negative for UTI x-ray done and CXR showed "No evidence of acute airspace opacity, although patient leftward rotation somewhat limits evaluation."   Dementia with behavioral disturbance (HCC) -We will place on as needed IM Haldol. -We will continue Donepezil 10 mg po Daily, Memantine 10 mg po Daily, Divalproex 250 mg po Daily, Escitalopram 10 mg po Daily, and Buspirone 15 mg po BID -Will make Alprazolam 0.25 mg po TID as needed for Anxiety  -SNF/Memory Care facility requests Psychiatric Evaluation prior to D/C so Psychiatry consulted for further assistance.   Essential Hypertension -We will can continue Metoprolol Succinate 25 mg po Daily -Continue to Monitor BP per Protocol -Last BP reading was 150/63   Coronary artery disease involving native coronary artery of native heart without angina pectoris -We will continue as needed Sublingual Nitroglycerin 0.4 mg SL q5hminPRN, Toprol-XL as above and aspirin.   Dyslipidemia -She is on Repatha subcutaneous injections.  Renal Insufficiency -BUN/Cr Trend: Recent Labs  Lab 04/12/23 1535 04/13/23 0231 04/14/23 0108  BUN 15 14 7*  CREATININE 1.13* 0.97 0.89  -  Avoid Nephrotoxic Medications, Contrast Dyes, Hypotension and Dehydration to Ensure Adequate Renal Perfusion and  will need to Renally Adjust Meds -Continue to Monitor and Trend Renal Function carefully and repeat CMP in the AM   Hyperglycemia -Glucose Trend: Recent Labs  Lab 04/12/23 1535 04/13/23 0231 04/14/23 0108  GLUCOSE 105* 148* 101*     DVT prophylaxis: SCDs Start: 04/13/23 0131    Code Status: DNR Family Communication: No family present at bedside  Disposition Plan:  Level of care: Med-Surg Status is: Observation The patient will require care spanning > 2 midnights and should be moved to inpatient because: Needs further Psychiatry Evaluation    Consultants:  Psychiatry   Procedures:  As delineated as above  Antimicrobials:  Anti-infectives (From admission, onward)    None       Subjective: Seen and examined at bedside and the patient was resting and not complaining of any pain currently.  No nausea or vomiting.  Was not agitated today.  No other concerns or close at this time.  Objective: Vitals:   04/13/23 1630 04/13/23 2005 04/14/23 0355 04/14/23 0806  BP: 138/79 (!) 151/57 121/75 (!) 150/63  Pulse: 74 63 (!) 58 65  Resp: 18   17  Temp: 98.1 F (36.7 C)  98.6 F (37 C)   TempSrc: Oral  Oral   SpO2: 93% 94% 98% 96%    Intake/Output Summary (Last 24 hours) at 04/14/2023 1337 Last data filed at 04/14/2023 0900 Gross per 24 hour  Intake 1067.37 ml  Output --  Net 1067.37 ml   There were no vitals filed for this visit.  Examination: Physical Exam:  Constitutional: WN/WD Caucasian female in no acute distress appears calm Respiratory: Diminished to auscultation bilaterally, no wheezing, rales, rhonchi or crackles. Normal respiratory effort and patient is not tachypenic. No accessory muscle use.  Unlabored breathing Cardiovascular: RRR, no murmurs / rubs / gallops. S1 and S2 auscultated. No extremity edema. Abdomen: Soft, non-tender, non-distended. Bowel sounds positive.  GU: Deferred. Musculoskeletal: No clubbing / cyanosis of digits/nails. No joint deformity  upper and lower extremities.   Skin: No rashes, lesions, ulcers on limited skin evaluation. No induration; Warm and dry.  Neurologic: CN 2-12 grossly intact with no focal deficits.  Romberg sign and cerebellar reflexes not assessed.  Psychiatric: Impaired judgment and insight.  She is awake but not fully oriented  Data Reviewed: I have personally reviewed following labs and imaging studies  CBC: Recent Labs  Lab 04/12/23 1535 04/13/23 0231 04/14/23 0108  WBC 6.3 9.0 8.1  NEUTROABS 4.5  --   --   HGB 13.3 13.3 14.5  HCT 40.3 39.5 43.0  MCV 96.4 92.9 94.3  PLT 216 196 199   Basic Metabolic Panel: Recent Labs  Lab 04/12/23 1535 04/13/23 0231 04/14/23 0108  NA 139 140 136  K 4.1 3.9 3.6  CL 105 102 102  CO2 25 27 24   GLUCOSE 105* 148* 101*  BUN 15 14 7*  CREATININE 1.13* 0.97 0.89  CALCIUM 9.1 9.0 8.3*   GFR: CrCl cannot be calculated (Unknown ideal weight.). Liver Function Tests: Recent Labs  Lab 04/12/23 1535  AST 35  ALT 31  ALKPHOS 52  BILITOT 0.5  PROT 6.3*  ALBUMIN 3.7   No results for input(s): "LIPASE", "AMYLASE" in the last 168 hours. No results for input(s): "AMMONIA" in the last 168 hours. Coagulation Profile: No results for input(s): "INR", "PROTIME" in the last 168 hours. Cardiac Enzymes: No results for input(s): "CKTOTAL", "CKMB", "  CKMBINDEX", "TROPONINI" in the last 168 hours. BNP (last 3 results) No results for input(s): "PROBNP" in the last 8760 hours. HbA1C: No results for input(s): "HGBA1C" in the last 72 hours. CBG: No results for input(s): "GLUCAP" in the last 168 hours. Lipid Profile: No results for input(s): "CHOL", "HDL", "LDLCALC", "TRIG", "CHOLHDL", "LDLDIRECT" in the last 72 hours. Thyroid Function Tests: No results for input(s): "TSH", "T4TOTAL", "FREET4", "T3FREE", "THYROIDAB" in the last 72 hours. Anemia Panel: No results for input(s): "VITAMINB12", "FOLATE", "FERRITIN", "TIBC", "IRON", "RETICCTPCT" in the last 72  hours. Sepsis Labs: No results for input(s): "PROCALCITON", "LATICACIDVEN" in the last 168 hours.  No results found for this or any previous visit (from the past 240 hour(s)).   Radiology Studies: DG CHEST PORT 1 VIEW  Result Date: 04/13/2023 CLINICAL DATA:  Altered mental status EXAM: PORTABLE CHEST 1 VIEW COMPARISON:  Chest x-ray dated April 12, 2023 FINDINGS: Leftward patient rotation somewhat limits evaluation. Cardiac and mediastinal contours are unchanged post median sternotomy and CABG. Mild left basilar opacities which are unchanged when compared with the prior exam, likely due to scarring. Lungs are otherwise clear. No evidence of pleural effusion or pneumothorax. IMPRESSION: No evidence of acute airspace opacity, although patient leftward rotation somewhat limits evaluation. Electronically Signed   By: Allegra Lai M.D.   On: 04/13/2023 16:27   CT Lumbar Spine Wo Contrast  Result Date: 04/12/2023 CLINICAL DATA:  Back trauma, no prior imaging (Age >= 16y) EXAM: CT LUMBAR SPINE WITHOUT CONTRAST TECHNIQUE: Multidetector CT imaging of the lumbar spine was performed without intravenous contrast administration. Multiplanar CT image reconstructions were also generated. RADIATION DOSE REDUCTION: This exam was performed according to the departmental dose-optimization program which includes automated exposure control, adjustment of the mA and/or kV according to patient size and/or use of iterative reconstruction technique. COMPARISON:  X-ray lumbar spine 04/12/2023, MRI lumbar spine 11/07/14, CT abdomen pelvis 10/31/2017 FINDINGS: Segmentation: 5 lumbar type vertebrae. Alignment: Grade 1 anterolisthesis of L3 on L4. Vertebrae: Diffusely decreased bone density. Left L5-S1 pseudoarthrosis. Chronic mild vertebral body height loss at the L3 level with associated inferior endplate Schmorl node. Acute transverse fracture through the L1 vertebral body. Indeterminate, likely chronic, nondisplaced cortical  fracture of the ventral wall of the L2 vertebral body. Paraspinal and other soft tissues: Negative. Disc levels: Multilevel intervertebral disc space vacuum phenomenon. Other: Atherosclerotic plaque.  Colonic diverticulosis. IMPRESSION: 1. Acute transverse fracture through the L1 vertebral body. 2. Indeterminate, likely chronic, nondisplaced cortical fracture of the ventral wall of the L2 vertebral body. 3. Chronic vertebral body height loss at the L3 level with associated inferior endplate Schmorl node. 4. Diffusely decreased bone density. 5. Other imaging findings of potential clinical significance: Colonic diverticulosis. Aortic Atherosclerosis (ICD10-I70.0). Electronically Signed   By: Tish Frederickson M.D.   On: 04/12/2023 19:27   DG Pelvis 1-2 Views  Result Date: 04/12/2023 CLINICAL DATA:  Trauma, fall EXAM: PELVIS - 1-2 VIEW COMPARISON:  None Available. FINDINGS: No displaced fracture is seen. Joint spaces in both hips appear symmetrical. SI joints are unremarkable. Degenerative changes are noted in the visualized lower lumbar spine. IMPRESSION: No recent fracture is seen in pelvis. Electronically Signed   By: Ernie Avena M.D.   On: 04/12/2023 16:40   DG Lumbar Spine Complete  Result Date: 04/12/2023 CLINICAL DATA:  Trauma, fall EXAM: LUMBAR SPINE - COMPLETE 4+ VIEW COMPARISON:  MRI lumbar spine done on 11/07/2014 FINDINGS: There is decrease in height of the body of the L3 vertebra which has  not changed since 11/07/2014. These mild decrease in height of upper endplate of body the L2 vertebra which may suggest recent or old mild compression. Degenerative changes are noted with disc space narrowing and bony spurs at multiple levels, more so at L4-L5 and L5-S1 levels. Osteopenia is seen in bony structures. Calcifications are noted in aorta and its major branches. IMPRESSION: There is mild 10-20% decrease in height of body of L2 vertebra which was not seen in the previous MRI done on 11/07/2014.  This may suggest recent or old mild compression fracture. If there are focal symptoms, follow-up CT or MRI may be considered. Old compression fracture in the body of L3 vertebra has not changed since 11/07/2014. Lumbar spondylosis, more severe at L4-L5 and L5-S1 levels. Arteriosclerosis. Electronically Signed   By: Ernie Avena M.D.   On: 04/12/2023 16:39   DG Hand Complete Left  Result Date: 04/12/2023 CLINICAL DATA:  Trauma, fall EXAM: LEFT HAND - COMPLETE 3+ VIEW COMPARISON:  None Available. FINDINGS: No displaced fracture or dislocation is seen. There is a monitoring device partly obscuring the middle and distal phalanges of the index finger. There is metallic ring partly obscuring the proximal phalanx of ring finger. Marked degenerative changes are noted in first metatarsophalangeal joint. Chondrocalcinosis in the left wrist may be due to degenerative arthritis. Bony spurs are seen in interphalangeal joints. IMPRESSION: No recent displaced fracture or dislocation is seen. Degenerative changes are noted in multiple joints, more severe in the first carpometacarpal joint. Other findings as described in the body of the report. Electronically Signed   By: Ernie Avena M.D.   On: 04/12/2023 16:33   DG Chest 1 View  Result Date: 04/12/2023 CLINICAL DATA:  Trauma, fall EXAM: CHEST  1 VIEW COMPARISON:  08/24/2022 FINDINGS: Cardiac size is within normal limits. There is previous coronary bypass surgery. There are no signs of pulmonary edema or focal pulmonary consolidation. Small linear densities in medial left lower lung field may suggest scarring. Low position of the diaphragms suggest possible COPD. IMPRESSION: No active cardiopulmonary disease. Electronically Signed   By: Ernie Avena M.D.   On: 04/12/2023 16:32   CT Head Wo Contrast  Result Date: 04/12/2023 CLINICAL DATA:  Fall EXAM: CT HEAD WITHOUT CONTRAST CT CERVICAL SPINE WITHOUT CONTRAST TECHNIQUE: Multidetector CT imaging of  the head and cervical spine was performed following the standard protocol without intravenous contrast. Multiplanar CT image reconstructions of the cervical spine were also generated. RADIATION DOSE REDUCTION: This exam was performed according to the departmental dose-optimization program which includes automated exposure control, adjustment of the mA and/or kV according to patient size and/or use of iterative reconstruction technique. COMPARISON:  CT head 08/24/2022, cervical spine CT 01/02/2019 FINDINGS: CT HEAD FINDINGS Brain: There is no acute intracranial hemorrhage, extra-axial fluid collection, or acute infarct. There is mild parenchymal volume loss with prominence of the ventricular system and extra-axial CSF spaces. The ventricles are stable in size since 2023 with unchanged dilation of the temporal horns. Gray-white differentiation is preserved. Hypodensity in the supratentorial white matter likely reflects sequela of underlying chronic small-vessel ischemic change The pituitary and suprasellar region are normal. There is no mass lesion. There is no mass effect or midline shift. Vascular: There is calcification of the bilateral carotid siphons and vertebral arteries. Skull: A prior right parietal burr hole is noted. There is no calvarial fracture. Sinuses/Orbits: The paranasal sinuses are clear. The globes and orbits are unremarkable. Other: None. CT CERVICAL SPINE FINDINGS Alignment: Grade 1 anterolisthesis  of C3 on C4, C4 on C5, and C7 on T1 is unchanged, likely degenerative in nature. There is no jumped or perched facet or other evidence of traumatic malalignment. Skull base and vertebrae: Skull base alignment is maintained. Vertebral body heights are preserved. There is no evidence of acute fracture. There is no suspicious osseous lesion. Soft tissues and spinal canal: No prevertebral fluid or swelling. No visible canal hematoma. Disc levels: Disc space narrowing degenerative endplate change C5-C6 and  C6-C7 is stable. There is no high-grade spinal canal stenosis. Upper chest: The imaged lung apices are clear. Other: None. IMPRESSION: 1. No acute intracranial pathology. 2. No acute fracture or traumatic malalignment of the cervical spine. Electronically Signed   By: Lesia Hausen M.D.   On: 04/12/2023 16:21   CT Cervical Spine Wo Contrast  Result Date: 04/12/2023 CLINICAL DATA:  Fall EXAM: CT HEAD WITHOUT CONTRAST CT CERVICAL SPINE WITHOUT CONTRAST TECHNIQUE: Multidetector CT imaging of the head and cervical spine was performed following the standard protocol without intravenous contrast. Multiplanar CT image reconstructions of the cervical spine were also generated. RADIATION DOSE REDUCTION: This exam was performed according to the departmental dose-optimization program which includes automated exposure control, adjustment of the mA and/or kV according to patient size and/or use of iterative reconstruction technique. COMPARISON:  CT head 08/24/2022, cervical spine CT 01/02/2019 FINDINGS: CT HEAD FINDINGS Brain: There is no acute intracranial hemorrhage, extra-axial fluid collection, or acute infarct. There is mild parenchymal volume loss with prominence of the ventricular system and extra-axial CSF spaces. The ventricles are stable in size since 2023 with unchanged dilation of the temporal horns. Gray-white differentiation is preserved. Hypodensity in the supratentorial white matter likely reflects sequela of underlying chronic small-vessel ischemic change The pituitary and suprasellar region are normal. There is no mass lesion. There is no mass effect or midline shift. Vascular: There is calcification of the bilateral carotid siphons and vertebral arteries. Skull: A prior right parietal burr hole is noted. There is no calvarial fracture. Sinuses/Orbits: The paranasal sinuses are clear. The globes and orbits are unremarkable. Other: None. CT CERVICAL SPINE FINDINGS Alignment: Grade 1 anterolisthesis of C3 on  C4, C4 on C5, and C7 on T1 is unchanged, likely degenerative in nature. There is no jumped or perched facet or other evidence of traumatic malalignment. Skull base and vertebrae: Skull base alignment is maintained. Vertebral body heights are preserved. There is no evidence of acute fracture. There is no suspicious osseous lesion. Soft tissues and spinal canal: No prevertebral fluid or swelling. No visible canal hematoma. Disc levels: Disc space narrowing degenerative endplate change C5-C6 and C6-C7 is stable. There is no high-grade spinal canal stenosis. Upper chest: The imaged lung apices are clear. Other: None. IMPRESSION: 1. No acute intracranial pathology. 2. No acute fracture or traumatic malalignment of the cervical spine. Electronically Signed   By: Lesia Hausen M.D.   On: 04/12/2023 16:21    Scheduled Meds:  acetaminophen  500 mg Oral TID   calcium-vitamin D  1 tablet Oral Daily   divalproex  250 mg Oral Daily   donepezil  10 mg Oral Daily   escitalopram  10 mg Oral Daily   memantine  10 mg Oral Daily   metoprolol succinate  25 mg Oral Daily   Continuous Infusions:  sodium chloride 75 mL/hr at 04/14/23 0309    LOS: 0 days   Marguerita Merles, DO [facility] Available via Epic secure chat 7am-7pm After these hours, please refer to coverage provider  listed on [email or web address] 04/14/2023, 1:37 PM

## 2023-04-14 NOTE — Care Management Obs Status (Signed)
MEDICARE OBSERVATION STATUS NOTIFICATION   Patient Details  Name: Nancy [name] MRN: [id number] Date of Birth: Dec 25, 1945   Medicare Observation Status Notification Given:  Yes    Lawerance Sabal, RN 04/14/2023, 8:50 AM

## 2023-04-14 NOTE — TOC Progression Note (Signed)
Transition of Care [facility]) - Progression Note    Patient Details  Name: Nancy [name] MRN: [id number] Date of Birth: 1945/11/28  [facility] CM/SW Contact  Erin Sons, [place] Phone Number: 04/14/2023, 11:21 AM  Clinical Narrative:     Pt remains observation status. She won't meet medicare qualified 3 midnight stay required for SNF. CSW called and updated daughter. Daughter is understanding. CSW will contact pt's memory to discuss further.   CSW called [facility]; left voicemail requesting return call.   1300: Spoke with Army Fossa with [facility] and informed her that pt not eligible for medicare covered snf stay. Discussed pt returning to [facility] with [facility] services. They have therapies on site with [facility] so would just need [facility] PT/OT orders at DC. CSW faxed clinicals to [phone]. [facility] requests psych consult due to pts increase in behavioral disturbances; CSW notified MD.   Expected Discharge Plan: Skilled Nursing Facility Barriers to Discharge: Continued Medical Work up, SNF Pending bed offer  Expected Discharge Plan and Services In-house Referral: Clinical Social Work     Living arrangements for the past 2 months: Assisted Living Facility                                       Social Determinants of Health (SDOH) Interventions SDOH Screenings   Alcohol Screen: Low Risk  (08/02/2018)  Depression (PHQ2-9): Low Risk  (02/20/2020)  Tobacco Use: Low Risk  (01/18/2023)    Readmission Risk Interventions     No data to display

## 2023-04-15 DIAGNOSIS — M545 Low back pain, unspecified: Secondary | ICD-10-CM | POA: Diagnosis not present

## 2023-04-15 DIAGNOSIS — I10 Essential (primary) hypertension: Secondary | ICD-10-CM | POA: Diagnosis not present

## 2023-04-15 DIAGNOSIS — S32019D Unspecified fracture of first lumbar vertebra, subsequent encounter for fracture with routine healing: Secondary | ICD-10-CM | POA: Diagnosis not present

## 2023-04-15 DIAGNOSIS — F03918 Unspecified dementia, unspecified severity, with other behavioral disturbance: Secondary | ICD-10-CM | POA: Diagnosis not present

## 2023-04-15 LAB — COMPREHENSIVE METABOLIC PANEL
ALT: 18 U/L (ref 0–44)
AST: 18 U/L (ref 15–41)
Albumin: 2.8 g/dL — ABNORMAL LOW (ref 3.5–5.0)
Alkaline Phosphatase: 45 U/L (ref 38–126)
Anion gap: 8 (ref 5–15)
BUN: 8 mg/dL (ref 8–23)
CO2: 25 mmol/L (ref 22–32)
Calcium: 8.2 mg/dL — ABNORMAL LOW (ref 8.9–10.3)
Chloride: 106 mmol/L (ref 98–111)
Creatinine, Ser: 0.82 mg/dL (ref 0.44–1.00)
GFR, Estimated: >60 mL/min (ref >60)
Glucose, Bld: 104 mg/dL — ABNORMAL HIGH (ref 70–99)
Potassium: 3.5 mmol/L (ref 3.5–5.1)
Sodium: 139 mmol/L (ref 135–145)
Total Bilirubin: 0.8 mg/dL (ref 0.3–1.2)
Total Protein: 5.8 g/dL — ABNORMAL LOW (ref 6.5–8.1)

## 2023-04-15 LAB — CBC WITH DIFFERENTIAL/PLATELET
Abs Immature Granulocytes: 0.05 10*3/uL (ref 0.00–0.07)
Basophils Absolute: 0.1 10*3/uL (ref 0.0–0.1)
Basophils Relative: 1 %
Eosinophils Absolute: 0.1 10*3/uL (ref 0.0–0.5)
Eosinophils Relative: 2 %
HCT: 42.2 % (ref 36.0–46.0)
Hemoglobin: 14.4 g/dL (ref 12.0–15.0)
Immature Granulocytes: 1 %
Lymphocytes Relative: 12 %
Lymphs Abs: 1 10*3/uL (ref 0.7–4.0)
MCH: 31.9 pg (ref 26.0–34.0)
MCHC: 34.1 g/dL (ref 30.0–36.0)
MCV: 93.4 fL (ref 80.0–100.0)
Monocytes Absolute: 0.8 10*3/uL (ref 0.1–1.0)
Monocytes Relative: 10 %
Neutro Abs: 6.3 10*3/uL (ref 1.7–7.7)
Neutrophils Relative %: 74 %
Platelets: 193 10*3/uL (ref 150–400)
RBC: 4.52 MIL/uL (ref 3.87–5.11)
RDW: 13.2 % (ref 11.5–15.5)
WBC: 8.3 10*3/uL (ref 4.0–10.5)
nRBC: 0 % (ref 0.0–0.2)

## 2023-04-15 LAB — PHOSPHORUS: Phosphorus: 2.4 mg/dL — ABNORMAL LOW (ref 2.5–4.6)

## 2023-04-15 LAB — MAGNESIUM: Magnesium: 2.1 mg/dL (ref 1.7–2.4)

## 2023-04-15 MED ORDER — HALOPERIDOL 1 MG PO TABS: 1.0000 mg | ORAL_TABLET | Freq: Four times a day (QID) | ORAL | Status: DC | PRN

## 2023-04-15 MED ORDER — HALOPERIDOL LACTATE 5 MG/ML IJ SOLN: 1.0000 mg | Freq: Four times a day (QID) | INTRAMUSCULAR | Status: DC | PRN

## 2023-04-15 MED ORDER — POTASSIUM CHLORIDE CRYS ER 20 MEQ PO TBCR
40.0000 meq | EXTENDED_RELEASE_TABLET | Freq: Once | ORAL | Status: AC
  Administered 2023-04-15: 40 meq via ORAL
  Filled 2023-04-15: qty 2

## 2023-04-15 MED ORDER — DIVALPROEX SODIUM ER 500 MG PO TB24
500.0000 mg | ORAL_TABLET | Freq: Every day | ORAL | Status: DC
  Administered 2023-04-15: 500 mg via ORAL
  Filled 2023-04-15: qty 1

## 2023-04-15 MED ORDER — K PHOS MONO-SOD PHOS DI & MONO 155-852-130 MG PO TABS
500.0000 mg | ORAL_TABLET | Freq: Two times a day (BID) | ORAL | Status: AC
  Administered 2023-04-15 (×2): 500 mg via ORAL
  Filled 2023-04-15 (×2): qty 2

## 2023-04-15 NOTE — Plan of Care (Signed)
  Problem: Pain Managment: Goal: General experience of comfort will improve Outcome: Progressing   Problem: Safety: Goal: Ability to remain free from injury will improve Outcome: Progressing   Problem: Skin Integrity: Goal: Risk for impaired skin integrity will decrease Outcome: Progressing   

## 2023-04-15 NOTE — Consult Note (Signed)
Nancy [name] Psychiatry Consult Evaluation  Service Date: April 15, 2023 LOS:  LOS: 1 day    Primary Psychiatric Diagnoses  Dementia with behavioral disturbance  Assessment  Nancy [name] is a 77 y.o. female admitted medically on 04/12/2023  3:02 PM for acute onset of fall and subsequent back pain in the setting of behavioral changes with her dementia.  Her past medical history is significant for HTN, CAD, HLD, and osteoporosis. There is no significant psychiatric history on chart review. The psychiatric consult service was consulted for management of her psychotropic medications and assessment of her worsening behavioral disturbances, by Dr. Marland Mcalpine  Nancy [name] has the well established diagnosis of dementia, with recent behavioral disturbances resulting in a recent fall. On assessment, there are very apparent deficits in cognitive function, including memory loss, disorientation to time and place, and impaired executive function.  There is no evidence of suicidal ideation, homicidal ideation, or hallucinations.  She presents to [facility] from [facility] and with multiple psychotropic medications including: escitalopram, buspar, depakote, and alprazolam. During this hospitalization her home Depakote was increased from  150 mg daily to 250 mg daily.  Nancy [name] medications have been adjusted with the goal of reducing polypharmacy, improving sleep hygiene, and minimizing period of agitation/delirium/sundowning. Benzodiazepines are generally not the first-line treatment for delirium and agitation in the elderly due to their side effect profile, including the potential for increased confusion, sedation, and fall risk. We have chosen to not completely discontinue her Xanax since we are unsure of how frequently she receives this medication at her facility and risk inducing benzodiazepine withdrawal.  An overview of recommendations are outline below: Delirium precautions Alprazolam 0.25 mg nightly as  needed Continue Lexapro 10 mg daily Depakote extended release 500 mg at bedtime for improved sleep hygiene and agitation (dosing for ER formulation is considerably higher as there is lower bioavailability (89%) compared to the DR formulation) Haldol 1 mg PO Q6Hrs PRN + Haldol 1 mg IM Q6Hrs PRN for acute agitation Trazodone, buspar, and melatonin PRN have been discontinued   Diagnoses:  Active Hospital problems: Principal Problem:   Closed fracture of first lumbar vertebra (HCC) Active Problems:   Coronary artery disease involving native coronary artery of native heart without angina pectoris   Dementia with behavioral disturbance (HCC)   Essential hypertension   Dyslipidemia   Back pain     Plan   ## Psychiatric Medication Recommendations:  Delirium precautions Alprazolam 0.25 mg nightly as needed Continue Lexapro 10 mg daily Depakote extended release 500 mg at bedtime for improved sleep hygiene and agitation (dosing for ER formulation is considerably higher as there is lower bioavailability (89%) compared to the DR formulation) Haldol 1 mg PO Q6Hrs PRN + Haldol 1 mg IM Q6Hrs PRN for acute agitation Trazodone, buspar, and melatonin PRN have been discontinued  ## Medical Decision Making Capacity:  Capacity was not formally addressed during this encounter, however she lacks  ## Further Work-up:  While pt on Qtc prolonging medications, please monitor & replete K+ to 4 and Mg2+ to 2 -- most recent EKG on 04/12/2023 had QtC of 419   ## Disposition:  -- There are no current psychiatric contraindications to discharge at this time  ## Behavioral / Environmental:  --  Delirium Precautions: Delirium Interventions for Nursing and Staff: - RN to open blinds every AM. - To Bedside: Glasses, hearing aide, and pt's own shoes. Make available to patients. when possible and encourage use. - Encourage po fluids  when appropriate, keep fluids within reach. - OOB to chair with meals. -  Passive ROM exercises to all extremities with AM & PM care. - RN to assess orientation to person, time and place QAM and PRN. - Recommend extended visitation hours with familiar family/friends as feasible. - Staff to minimize disturbances at night. Turn off television when pt asleep or when not in use.     ## Safety and Observation Level:  - Based on my clinical evaluation, I estimate the patient to be at low risk of self harm in the current setting - At this time, we recommend a routine level of observation. This decision is based on my review of the chart including patient's history and current presentation, interview of the patient, mental status examination, and consideration of suicide risk including evaluating suicidal ideation, plan, intent, suicidal or self-harm behaviors, risk factors, and protective factors. This judgment is based on our ability to directly address suicide risk, implement suicide prevention strategies and develop a safety plan while the patient is in the clinical setting. Please contact our team if there is a concern that risk level has changed.  Suicide risk assessment  Patient has following modifiable risk factors for suicide: chronic medical conditions  Patient has following non-modifiable or demographic risk factors for suicide: hx of dementia  Patient has the following protective factors against suicide: Access to outpatient mental health care   Thank you for this consult request. Recommendations have been communicated to the primary team.  We will follow at this time.   Nancy Frederick, MD  Psychiatric and Social History   Relevant Aspects of Hospital Course:  Admitted on 04/12/2023 for acute onset of fall and subsequent back pain in the setting of behavioral changes with her dementia.   Patient Report:  Patient assessed at bedside, greets this interviewer.  She is only oriented to place.  She has difficulty answering questions related to her  history, she wishes to be discharged from the hospital and has difficulty recalling why she was admitted.  She provides consent for [place] to contact her sister Rinaldo Cloud.  On assessment she denies suicidal ideation, homicidal ideation, and all hallucinations.  She denies paranoid ideations.   Psychiatric ROS Mood Symptoms Unable to formally assess given patient's limited cognition.  Denies suicidal ideation.  Manic Symptoms Unable to formally assess given patient's limited cognition.  There is no history of bipolar disorder identified on chart review.  Anxiety Symptoms Unable to formally assess given patient's limited cognition.  Has no history of anxiety disorders identified on chart review.  Trauma Symptoms Not assessed  Psychosis Symptoms Patient denies any perceptual disturbances including hallucinations, paranoia, or delusions.    Collateral information:  Plan to contact sister Rinaldo Cloud tomorrow for collateral.   Psychiatric History:   Prev Dx/Sx: Dementia with behavioral disturbances.  No other psychiatric history identified on chart review Current Psych Provider: Patient is seen by psychiatric provider at memory care unit Current Meds: Lexapro, alprazolam, BuSpar, Depakote Therapy: None identified on chart review  Prior ECT: None identified on chart review Prior Psych Hospitalization: None identified a chart review Prior Self Harm: None identified on chart review Prior Violence: None identified on chart review  Family Psych History: Patient unable to recall significant portions of her past history.  Unable to answer. Family Hx suicide: Patient unable to recall significant portions of her past history.  Unable to answer.  Social History:  Developmental Hx: None identified on chart review Educational Hx: None identified on chart  review Occupational Hx: None identified on chart review Legal Hx: None identified on chart review Living Situation: Patient is living [facility]  memory care unit Spiritual Hx: Not assessed Access to weapons: None identified on chart review    Tobacco use: None identified on chart review Alcohol use: None identified on chart review Drug use: None identified on chart review   Exam Findings   Psychiatric Specialty Exam:  Presentation  General Appearance: Casual; Neat  Eye Contact:Good  Speech:Clear and Coherent; Normal Rate  Speech Volume:Decreased  Handedness:Right   Mood and Affect  Mood:Euthymic  Affect:Full Range; Congruent   Thought Process  Thought Processes:Irrevelant  Descriptions of Associations:Loose  Orientation:Partial  Thought Content:Scattered  History of Schizophrenia/Schizoaffective disorder:None Duration of Psychotic Symptoms:None Hallucinations:Hallucinations: None  Ideas of Reference:None  Suicidal Thoughts:Suicidal Thoughts: No  Homicidal Thoughts:Homicidal Thoughts: No   Sensorium  Memory:Immediate Poor; Recent Poor; Remote Poor  Judgment:Impaired  Insight:Poor   Executive Functions  Concentration:Poor  Attention Span:Poor  Recall:Poor  Fund of Knowledge:Poor  Language:Poor   Psychomotor Activity  Psychomotor Activity:Psychomotor Activity: Tremor   Assets  Assets:Housing   Sleep  Sleep:Sleep: Poor    Physical Exam: Vital signs:  Temp:  [97.8 F (36.6 C)-98.6 F (37 C)] 97.8 F (36.6 C) (06/27 1656) Pulse Rate:  [55-77] 56 (06/27 1656) Resp:  [17-18] 18 (06/27 1656) BP: (150-164)/(53-93) 157/55 (06/27 1656) SpO2:  [96 %-97 %] 97 % (06/27 1656) Physical Exam Constitutional:      General: She is not in acute distress.    Appearance: She is not ill-appearing.  HENT:     Head: Normocephalic and atraumatic.  Pulmonary:     Effort: Pulmonary effort is normal. No respiratory distress.  Skin:    General: Skin is warm and dry.  Neurological:     Mental Status: She is alert. She is disoriented.     Blood pressure (!) 157/55, pulse (!) 56,  temperature 97.8 F (36.6 C), temperature source Oral, resp. rate 18, SpO2 97 %. There is no height or weight on file to calculate BMI.   Other History   These have been pulled in through the EMR, reviewed, and updated if appropriate.   Family History:  The patient's family history includes Cancer in her maternal grandfather and mother; Heart attack in her father; Hypertension in her brother and mother; Kidney disease in her mother; Memory loss in her maternal grandmother and mother; Stroke in her maternal grandmother.  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    Surgical History: Past Surgical History:  Procedure Laterality Date   CORONARY ANGIOPLASTY WITH STENT PLACEMENT  2010   3 times   CORONARY ARTERY BYPASS GRAFT  2009   CRANIOTOMY  04/27/2012   Procedure: CRANIOTOMY HEMATOMA EVACUATION SUBDURAL;  Surgeon: Tia Alert, MD;  Location: [facility];  Service: Neurosurgery;  Laterality: Right;  Right Frontal Burr hole for Subdural Hematoma   NASAL SINUS SURGERY     POLYPECTOMY      Medications:   Current Facility-Administered Medications:    acetaminophen (TYLENOL) tablet 650 mg, 650 mg, Oral, Q6H PRN, 650 mg at 04/13/23 0438 **OR** acetaminophen (TYLENOL) suppository 650 mg, 650 mg, Rectal, Q6H PRN, Mansy, Jan A, MD   acetaminophen (TYLENOL) tablet 500 mg, 500 mg, Oral, TID, Ramiro Harvest  V, MD, 500 mg at 04/15/23 1617   calcium-vitamin D (OSCAL WITH D) 500-5 MG-MCG per tablet 1 tablet, 1 tablet, Oral, Daily, Mansy, Jan A, MD, 1 tablet at 04/15/23 1032   divalproex (DEPAKOTE ER) 24 hr tablet 500 mg, 500 mg, Oral, QHS, Carrion-Carrero, Izaiha Lo, MD   donepezil (ARICEPT) tablet 10 mg, 10 mg, Oral, Daily, Mansy, Jan A, MD, 10 mg at 04/15/23 1032   escitalopram (LEXAPRO)  tablet 10 mg, 10 mg, Oral, Daily, Mansy, Jan A, MD, 10 mg at 04/15/23 1033   haloperidol (HALDOL) tablet 1 mg, 1 mg, Oral, Q6H PRN **OR** haloperidol lactate (HALDOL) injection 1 mg, 1 mg, Intramuscular, Q6H PRN, Carrion-Carrero, Latrica Clowers, MD   magnesium hydroxide (MILK OF MAGNESIA) suspension 30 mL, 30 mL, Oral, Daily PRN, Mansy, Jan A, MD   memantine [facility]) tablet 10 mg, 10 mg, Oral, Daily, Mansy, Jan A, MD, 10 mg at 04/15/23 1032   metoprolol succinate (TOPROL-XL) 24 hr tablet 25 mg, 25 mg, Oral, Daily, Mansy, Jan A, MD, 25 mg at 04/15/23 1032   nitroGLYCERIN (NITROSTAT) SL tablet 0.4 mg, 0.4 mg, Sublingual, Q5 min PRN, Mansy, Jan A, MD   ondansetron (ZOFRAN) tablet 4 mg, 4 mg, Oral, Q6H PRN **OR** ondansetron (ZOFRAN) injection 4 mg, 4 mg, Intravenous, Q6H PRN, Mansy, Jan A, MD   phosphorus (K PHOS NEUTRAL) tablet 500 mg, 500 mg, Oral, BID, Sheikh, Omair Latif, DO, 500 mg at 04/15/23 0840  Allergies: Allergies  Allergen Reactions   Alendronate Shortness Of Breath and Swelling   Atorvastatin Other (See Comments)    Muscle cramps   Bactrim [Sulfamethoxazole-Trimethoprim] Other (See Comments)    Blisters/skin peeling, nausea   Buprenorphine Hcl Nausea Only   Morphine Nausea Only    OTHER   Morphine And Codeine Nausea Only    Morphine only   Prednisone Other (See Comments)    Unknown   Sulfa Antibiotics Itching    Blisters/skin peeling   Sulfasalazine Itching    Blisters/skin peeling   Sulfonamide Derivatives Other (See Comments)    Blisters/skin peeling

## 2023-04-15 NOTE — Progress Notes (Signed)
PROGRESS NOTE    Nancy [name]  GNF:[id number] DOB: October 03, 1946 DOA: 04/12/2023 PCP: System, Provider Not In   Brief Narrative:  The patient is a 77 year old Caucasian female with past medical history significant for 1 to CAD, dyslipidemia hypertension, osteoporosis as well as dementia and behavioral disturbances presented emergency room with acute onset fall with subsequent back pain.  She has been having behavioral changes with her dementia and yesterday she was kicking spitting and fell and hit her head.  Reportedly over the last few weeks she been having worsening behavioral disturbances and her Xanax was increased up to 3 times a day and which has not helped and BuSpar was added on 04/06/2023.  She presented to the ED for a fall and had nausea and vomiting once in the ED but no fevers or chills.  Further workup was done and she had her head scanned which showed no acute intracranial abnormalities and no acute findings on her cervical spine CT but lumbar spine CT did show an acute transverse fracture of L1 vertebral body and indeterminate likely chronic nondisplaced cortical fracture of the ventral wall of the L2 vertebral body.  The EDP spoke with neurosurgery who recommended lumbosacral brace and follow-up in 2 weeks in outpatient basis with no surgical intervention necessary.  PT OT recommending SNF.  Her anxious memory care unit is requesting psychiatric evaluation psychiatry has been consulted.  Her pain appears well-controlled and she is awaiting disposition back to SNF or her memory care unit once cleared.  Assessment and Plan:  * Closed fracture of first lumbar vertebra [facility]) Mechanical Fall -Head and Cervical Spine CT done and showed "No acute intracranial pathology. No acute fracture or traumatic malalignment of the cervical spine." -CT Lumbar Spine done and showed " Acute transverse fracture through the L1 vertebral body. Indeterminate, likely chronic, nondisplaced cortical fracture of  the ventral wall of the L2 vertebral body. Chronic vertebral body height loss at the L3 level with associated inferior endplate Schmorl node. Diffusely decreased bone density. Other imaging findings of potential clinical significance: Colonic diverticulosis. Aortic Atherosclerosis."  -This is secondary to mechanical fall. -The patient will be admitted to the medical observation bed but was changed to Inpatient after discussion with UR Team -Pain management will be provided. -EDP obtained phone consultation with neurosurgery was obtained the recommendation was for lumbosacral brace and follow-up in 2 weeks on outpatient basis. -PT consult will be obtained to assist with ambulation. -Will follow neurochecks given her head injury. -UA Negative for UTI x-ray done and CXR showed "No evidence of acute airspace opacity, although patient leftward rotation somewhat limits evaluation."   Dementia with Behavioral Disturbance (HCC) -We will place on as needed IM Haldol and Psychiatry added a po Option -We will continue Donepezil 10 mg po Daily, Memantine 10 mg po Daily,  -Divalproex 250 mg po Daily was changed to ER 500 mg at bedtime by Psychiatry, Escitalopram 10 mg po Daily, and Buspirone 15 mg po BID -Alprazolam 0.25 mg po TID as needed for Anxiety was changed to po qHSprn given benzodiazepines can worsen delirium and have limited efficacy in geriatric patients. -Her PRN trazodone and melatonin were discontinued by Psychiatry (hasn't been taking this) -SNF/Memory Care facility requests Psychiatric Evaluation prior to D/C so Psychiatry consulted for further assistance and see above changes   Essential Hypertension -We will can continue Metoprolol Succinate 25 mg po Daily -Continue to Monitor BP per Protocol -Last BP reading was 164/93   Coronary artery disease involving native  coronary artery of native heart without angina pectoris -We will continue as needed Sublingual Nitroglycerin 0.4 mg SL  q5hminPRN, Toprol-XL as above and aspirin.   Dyslipidemia -She is on Repatha subcutaneous injections.  Hypophosphatemia -Phos Level Trend: Recent Labs  Lab 04/15/23 0427  PHOS 2.4*  -Replete with po K Phos Neutral 500 mg po BID  Renal Insufficiency, improved -BUN/Cr Trend: Recent Labs  Lab 04/12/23 1535 04/13/23 0231 04/14/23 0108 04/15/23 0427  BUN 15 14 7* 8  CREATININE 1.13* 0.97 0.89 0.82  -Patient was getting IVF with NS at 75 mL/hr but will now discontinue -Avoid Nephrotoxic Medications, Contrast Dyes, Hypotension and Dehydration to Ensure Adequate Renal Perfusion and will need to Renally Adjust Meds -Continue to Monitor and Trend Renal Function carefully and repeat CMP in the AM   Hyperglycemia -Glucose Trend: Recent Labs  Lab 04/12/23 1535 04/13/23 0231 04/14/23 0108 04/15/23 0427  GLUCOSE 105* 148* 101* 104*    Hypoalbuminemia -Patient's Albumin Trend: Recent Labs  Lab 04/12/23 1535 04/15/23 0427  ALBUMIN 3.7 2.8*  -Continue to Monitor and Trend and repeat CMP in the AM   DVT prophylaxis: SCDs Start: 04/13/23 0131    Code Status: DNR Family Communication: Discussed with Daughter at bedside  Disposition Plan:  Level of care: Med-Surg Status is: Inpatient Remains inpatient appropriate because: Appears medically stable to be discharged back to her facility but will need safe discharge disposition and her facility to be able will take her back   Consultants:  Psychiatry Neurosurgery  Procedures:  As delineated as above  Antimicrobials:  Anti-infectives (From admission, onward)    None       Subjective: Seen and examined at bedside and she is calm.  Daughter is asking about if therapy was coming to see her today.  Patient had no complaints.  No chest pain or shortness of breath.  No other concerns or complaints this time denies any back pain.  Objective: Vitals:   04/14/23 1538 04/14/23 1959 04/15/23 0516 04/15/23 0755  BP: (!) 154/62  (!) 150/53 (!) 157/68 (!) 164/93  Pulse: (!) 58 77 68 (!) 55  Resp: 17   17  Temp: 98.2 F (36.8 C) 98.6 F (37 C) 98 F (36.7 C) 98.1 F (36.7 C)  TempSrc: Oral   Oral  SpO2: 96% 96% 97% 96%    Intake/Output Summary (Last 24 hours) at 04/15/2023 1604 Last data filed at 04/14/2023 1944 Gross per 24 hour  Intake --  Output 750 ml  Net -750 ml   There were no vitals filed for this visit.  Examination: Physical Exam:  Constitutional: WN/WD Caucasian female in no acute distress appears calm Respiratory: Diminished to auscultation bilaterally, no wheezing, rales, rhonchi or crackles. Normal respiratory effort and patient is not tachypenic. No accessory muscle use.  Unlabored breathing Cardiovascular: RRR, no murmurs / rubs / gallops. S1 and S2 auscultated. No extremity edema.  Abdomen: Soft, non-tender, non-distended. Bowel sounds positive.  GU: Deferred. Musculoskeletal: No clubbing / cyanosis of digits/nails. No joint deformity upper and lower extremities.  Skin: No rashes, lesions, ulcers on a limited skin evaluation. No induration; Warm and dry.  Neurologic: CN 2-12 grossly intact with no focal deficits. Romberg sign and cerebellar reflexes not assessed.  Psychiatric: Impaired judgment and insight.  She is awake but not fully oriented  Data Reviewed: I have personally reviewed following labs and imaging studies  CBC: Recent Labs  Lab 04/12/23 1535 04/13/23 0231 04/14/23 0108 04/15/23 0427  WBC 6.3 9.0 8.1  8.3  NEUTROABS 4.5  --   --  6.3  HGB 13.3 13.3 14.5 14.4  HCT 40.3 39.5 43.0 42.2  MCV 96.4 92.9 94.3 93.4  PLT 216 196 199 193   Basic Metabolic Panel: Recent Labs  Lab 04/12/23 1535 04/13/23 0231 04/14/23 0108 04/15/23 0427  NA 139 140 136 139  K 4.1 3.9 3.6 3.5  CL 105 102 102 106  CO2 25 27 24 25   GLUCOSE 105* 148* 101* 104*  BUN 15 14 7* 8  CREATININE 1.13* 0.97 0.89 0.82  CALCIUM 9.1 9.0 8.3* 8.2*  MG  --   --   --  2.1  PHOS  --   --   --  2.4*    GFR: CrCl cannot be calculated (Unknown ideal weight.). Liver Function Tests: Recent Labs  Lab 04/12/23 1535 04/15/23 0427  AST 35 18  ALT 31 18  ALKPHOS 52 45  BILITOT 0.5 0.8  PROT 6.3* 5.8*  ALBUMIN 3.7 2.8*   No results for input(s): "LIPASE", "AMYLASE" in the last 168 hours. No results for input(s): "AMMONIA" in the last 168 hours. Coagulation Profile: No results for input(s): "INR", "PROTIME" in the last 168 hours. Cardiac Enzymes: No results for input(s): "CKTOTAL", "CKMB", "CKMBINDEX", "TROPONINI" in the last 168 hours. BNP (last 3 results) No results for input(s): "PROBNP" in the last 8760 hours. HbA1C: No results for input(s): "HGBA1C" in the last 72 hours. CBG: No results for input(s): "GLUCAP" in the last 168 hours. Lipid Profile: No results for input(s): "CHOL", "HDL", "LDLCALC", "TRIG", "CHOLHDL", "LDLDIRECT" in the last 72 hours. Thyroid Function Tests: No results for input(s): "TSH", "T4TOTAL", "FREET4", "T3FREE", "THYROIDAB" in the last 72 hours. Anemia Panel: No results for input(s): "VITAMINB12", "FOLATE", "FERRITIN", "TIBC", "IRON", "RETICCTPCT" in the last 72 hours. Sepsis Labs: No results for input(s): "PROCALCITON", "LATICACIDVEN" in the last 168 hours.  No results found for this or any previous visit (from the past 240 hour(s)).   Radiology Studies: No results found.  Scheduled Meds:  acetaminophen  500 mg Oral TID   calcium-vitamin D  1 tablet Oral Daily   divalproex  500 mg Oral QHS   donepezil  10 mg Oral Daily   escitalopram  10 mg Oral Daily   memantine  10 mg Oral Daily   metoprolol succinate  25 mg Oral Daily   phosphorus  500 mg Oral BID   potassium chloride  40 mEq Oral Once   Continuous Infusions:   LOS: 1 day   Marguerita Merles, DO [facility] Available via Epic secure chat 7am-7pm After these hours, please refer to coverage provider listed on [email or web address] 04/15/2023, 4:04 PM

## 2023-04-16 DIAGNOSIS — Z79899 Other long term (current) drug therapy: Secondary | ICD-10-CM

## 2023-04-16 DIAGNOSIS — F03918 Unspecified dementia, unspecified severity, with other behavioral disturbance: Secondary | ICD-10-CM | POA: Diagnosis not present

## 2023-04-16 DIAGNOSIS — F0394 Unspecified dementia, unspecified severity, with anxiety: Secondary | ICD-10-CM | POA: Diagnosis not present

## 2023-04-16 DIAGNOSIS — M545 Low back pain, unspecified: Secondary | ICD-10-CM | POA: Diagnosis not present

## 2023-04-16 DIAGNOSIS — S32019D Unspecified fracture of first lumbar vertebra, subsequent encounter for fracture with routine healing: Secondary | ICD-10-CM | POA: Diagnosis not present

## 2023-04-16 DIAGNOSIS — I10 Essential (primary) hypertension: Secondary | ICD-10-CM | POA: Diagnosis not present

## 2023-04-16 MED ORDER — DIVALPROEX SODIUM 125 MG PO CSDR
125.0000 mg | DELAYED_RELEASE_CAPSULE | Freq: Three times a day (TID) | ORAL | Status: DC
  Administered 2023-04-16 – 2023-04-17 (×4): 125 mg via ORAL
  Filled 2023-04-16 (×4): qty 1

## 2023-04-16 MED ORDER — MELATONIN 3 MG PO TABS: 3.0000 mg | ORAL_TABLET | Freq: Every evening | ORAL | Status: DC | PRN

## 2023-04-16 MED ORDER — TRAMADOL HCL 50 MG PO TABS
50.0000 mg | ORAL_TABLET | Freq: Four times a day (QID) | ORAL | Status: DC | PRN
  Administered 2023-04-16 – 2023-04-17 (×3): 50 mg via ORAL
  Filled 2023-04-16 (×3): qty 1

## 2023-04-16 MED ORDER — LIDOCAINE 5 % EX PTCH
1.0000 | MEDICATED_PATCH | CUTANEOUS | Status: DC
  Administered 2023-04-16 – 2023-04-17 (×2): 1 via TRANSDERMAL
  Filled 2023-04-16 (×2): qty 1

## 2023-04-16 NOTE — Care Management Important Message (Signed)
Important Message  Patient Details  Name: Nancy [name] MRN: [id number] Date of Birth: 02-Apr-1946   Medicare Important Message Given:  Yes     Sherilyn Banker 04/16/2023, 2:31 PM

## 2023-04-16 NOTE — Progress Notes (Signed)
PROGRESS NOTE    Nancy [name]  ZOX:[id number] DOB: 15-Jul-1946 DOA: 04/12/2023 PCP: System, Provider Not In   Brief Narrative:  The patient is a 77 year old Caucasian female with past medical history significant for 1 to CAD, dyslipidemia hypertension, osteoporosis as well as dementia and behavioral disturbances presented emergency room with acute onset fall with subsequent back pain.  She has been having behavioral changes with her dementia and yesterday she was kicking spitting and fell and hit her head.  Reportedly over the last few weeks she been having worsening behavioral disturbances and her Xanax was increased up to 3 times a day and which has not helped and BuSpar was added on 04/06/2023.  She presented to the ED for a fall and had nausea and vomiting once in the ED but no fevers or chills.  Further workup was done and she had her head scanned which showed no acute intracranial abnormalities and no acute findings on her cervical spine CT but lumbar spine CT did show an acute transverse fracture of L1 vertebral body and indeterminate likely chronic nondisplaced cortical fracture of the ventral wall of the L2 vertebral body.  The EDP spoke with neurosurgery who recommended lumbosacral brace and follow-up in 2 weeks in outpatient basis with no surgical intervention necessary.  PT OT recommending SNF.  Her anxious memory care unit is requesting psychiatric evaluation psychiatry has been consulted.  Her pain appears well-controlled resting and worsens with movement and she is awaiting disposition back to SNF or her memory care unit once cleared. Per TOC conversation the wants the daughter to hire private caregiver for the patient to return and if not an SNF would be an option given that she continues to have significant pain.  Assessment and Plan:  * Closed fracture of first lumbar vertebra [facility]) Mechanical Fall -Head and Cervical Spine CT done and showed "No acute intracranial pathology. No acute  fracture or traumatic malalignment of the cervical spine." -CT Lumbar Spine done and showed " Acute transverse fracture through the L1 vertebral body. Indeterminate, likely chronic, nondisplaced cortical fracture of the ventral wall of the L2 vertebral body. Chronic vertebral body height loss at the L3 level with associated inferior endplate Schmorl node. Diffusely decreased bone density. Other imaging findings of potential clinical significance: Colonic diverticulosis. Aortic Atherosclerosis."  -This is secondary to mechanical fall. -The patient will be admitted to the medical observation bed but was changed to Inpatient after discussion with UR Team -Pain management will be provided. -EDP obtained phone consultation with neurosurgery was obtained the recommendation was for lumbosacral brace and follow-up in 2 weeks on outpatient basis. -PT consult will be obtained to assist with ambulation and recommending SNF -Will follow neurochecks given her head injury. -UA Negative for UTI x-ray done and CXR showed "No evidence of acute airspace opacity, although patient leftward rotation somewhat limits evaluation." -Patient is Afebrile and has no Leukocytosis -She is continuing to have some pain so we will add p.o. Tramadol 50 mg q6hprn and a Lidocaine Patch   Dementia with Behavioral Disturbance (HCC) -We will place on as needed IM Haldol and Psychiatry added a po Option -We will continue Donepezil 10 mg po Daily, Memantine 10 mg po Daily,  -Divalproex 250 mg po Daily was changed to ER 500 mg at bedtime by Psychiatry, Escitalopram 10 mg po Daily,  -Buspirone 15 mg po BID now stopped by Psychiatry -Alprazolam 0.25 mg po TID as needed for Anxiety was changed to po qHSprn given benzodiazepines can worsen  delirium and have limited efficacy in geriatric patients and now completely stopped by Psychiatry -Her PRN trazodone and melatonin were discontinued by Psychiatry (hasn't been taking this) -SNF/Memory Care  facility requests Psychiatric Evaluation prior to D/C so Psychiatry consulted for further assistance and see above changes   Essential Hypertension -We will can continue Metoprolol Succinate 25 mg po Daily -Continue to Monitor BP per Protocol -Last BP reading was 147/69   Coronary artery disease involving native coronary artery of native heart without angina pectoris -We will continue as needed Sublingual Nitroglycerin 0.4 mg SL q5hminPRN, Toprol-XL as above and aspirin.   Dyslipidemia -She is on Repatha subcutaneous injections.  Hypophosphatemia -Phos Level Trend: Recent Labs  Lab 04/15/23 0427  PHOS 2.4*  -Replete with po K Phos Neutral 500 mg po BID yesterday  Renal Insufficiency, improved -BUN/Cr Trend: Recent Labs  Lab 04/12/23 1535 04/13/23 0231 04/14/23 0108 04/15/23 0427  BUN 15 14 7* 8  CREATININE 1.13* 0.97 0.89 0.82  -Patient was getting IVF with NS at 75 mL/hr but will now discontinue -Avoid Nephrotoxic Medications, Contrast Dyes, Hypotension and Dehydration to Ensure Adequate Renal Perfusion and will need to Renally Adjust Meds -Continue to Monitor and Trend Renal Function carefully and repeat CMP intermittently   Hyperglycemia -Glucose Trend: Recent Labs  Lab 04/12/23 1535 04/13/23 0231 04/14/23 0108 04/15/23 0427  GLUCOSE 105* 148* 101* 104*  -Continue to Monitor and Trend Intermittently   Hypoalbuminemia -Patient's Albumin Trend: Recent Labs  Lab 04/12/23 1535 04/15/23 0427  ALBUMIN 3.7 2.8*  -Continue to Monitor and Trend and repeat CMP intermittently    DVT prophylaxis: SCDs Start: 04/13/23 0131    Code Status: DNR Family Communication: Discussed with Daughter at bedside  Disposition Plan:  Level of care: Med-Surg Status is: Inpatient Remains inpatient appropriate because: Having a lot of Pain and needs Safe Discharge Disposition with SNF vs ALF with Caregiver    Consultants:  Psychiatry Neurosurgery  Procedures:  As delineated  as above   Antimicrobials:  Anti-infectives (From admission, onward)    None       Subjective: Seen and examined at bedside and she is calm and felt weak.  Whenever her daughter sets her up she continues have significant pain in her back.  Daughter thinks that the acetaminophen is not really helping very much so we have added tramadol and a lidocaine patch.  No chest pain or shortness of breath.  She denies any other concerns or complaints at this time.  Objective: Vitals:   04/15/23 1656 04/15/23 2055 04/16/23 0608 04/16/23 0728  BP: (!) 157/55 (!) 142/72 (!) 143/68 (!) 147/69  Pulse: (!) 56 65 68 63  Resp: 18 18 18 16   Temp: 97.8 F (36.6 C) 98.4 F (36.9 C) 98.2 F (36.8 C) (!) 97.4 F (36.3 C)  TempSrc: Oral Oral Oral Oral  SpO2: 97% 100% 98% 97%    Intake/Output Summary (Last 24 hours) at 04/16/2023 1449 Last data filed at 04/16/2023 0200 Gross per 24 hour  Intake 240 ml  Output 900 ml  Net -660 ml   There were no vitals filed for this visit.  Examination: Physical Exam:  Constitutional: WN/WD occasion female in no acute distress appears calm Respiratory: Diminished to auscultation bilaterally, no wheezing, rales, rhonchi or crackles. Normal respiratory effort and patient is not tachypenic. No accessory muscle use.  Unlabored breathing Cardiovascular: RRR, no murmurs / rubs / gallops. S1 and S2 auscultated. No extremity edema.  Abdomen: Soft, non-tender, non-distended. Bowel sounds positive.  GU: Deferred. Musculoskeletal: No clubbing / cyanosis of digits/nails. No joint deformity upper and lower extremities.   Skin: No rashes, lesions, ulcers on limited evaluation. No induration; Warm and dry.  Neurologic: CN 2-12 grossly intact with no focal deficits. Romberg sign and cerebellar reflexes not assessed.  Psychiatric: Impaired judgment and insight.  She is awake but not fully oriented  Data Reviewed: I have personally reviewed following labs and imaging  studies  CBC: Recent Labs  Lab 04/12/23 1535 04/13/23 0231 04/14/23 0108 04/15/23 0427  WBC 6.3 9.0 8.1 8.3  NEUTROABS 4.5  --   --  6.3  HGB 13.3 13.3 14.5 14.4  HCT 40.3 39.5 43.0 42.2  MCV 96.4 92.9 94.3 93.4  PLT 216 196 199 193   Basic Metabolic Panel: Recent Labs  Lab 04/12/23 1535 04/13/23 0231 04/14/23 0108 04/15/23 0427  NA 139 140 136 139  K 4.1 3.9 3.6 3.5  CL 105 102 102 106  CO2 25 27 24 25   GLUCOSE 105* 148* 101* 104*  BUN 15 14 7* 8  CREATININE 1.13* 0.97 0.89 0.82  CALCIUM 9.1 9.0 8.3* 8.2*  MG  --   --   --  2.1  PHOS  --   --   --  2.4*   GFR: CrCl cannot be calculated (Unknown ideal weight.). Liver Function Tests: Recent Labs  Lab 04/12/23 1535 04/15/23 0427  AST 35 18  ALT 31 18  ALKPHOS 52 45  BILITOT 0.5 0.8  PROT 6.3* 5.8*  ALBUMIN 3.7 2.8*   No results for input(s): "LIPASE", "AMYLASE" in the last 168 hours. No results for input(s): "AMMONIA" in the last 168 hours. Coagulation Profile: No results for input(s): "INR", "PROTIME" in the last 168 hours. Cardiac Enzymes: No results for input(s): "CKTOTAL", "CKMB", "CKMBINDEX", "TROPONINI" in the last 168 hours. BNP (last 3 results) No results for input(s): "PROBNP" in the last 8760 hours. HbA1C: No results for input(s): "HGBA1C" in the last 72 hours. CBG: No results for input(s): "GLUCAP" in the last 168 hours. Lipid Profile: No results for input(s): "CHOL", "HDL", "LDLCALC", "TRIG", "CHOLHDL", "LDLDIRECT" in the last 72 hours. Thyroid Function Tests: No results for input(s): "TSH", "T4TOTAL", "FREET4", "T3FREE", "THYROIDAB" in the last 72 hours. Anemia Panel: No results for input(s): "VITAMINB12", "FOLATE", "FERRITIN", "TIBC", "IRON", "RETICCTPCT" in the last 72 hours. Sepsis Labs: No results for input(s): "PROCALCITON", "LATICACIDVEN" in the last 168 hours.  No results found for this or any previous visit (from the past 240 hour(s)).   Radiology Studies: No results  found.  Scheduled Meds:  acetaminophen  500 mg Oral TID   calcium-vitamin D  1 tablet Oral Daily   divalproex  125 mg Oral TID WC   donepezil  10 mg Oral Daily   escitalopram  10 mg Oral Daily   lidocaine  1 patch Transdermal Q24H   memantine  10 mg Oral Daily   metoprolol succinate  25 mg Oral Daily   Continuous Infusions:   LOS: 2 days   Marguerita Merles, DO [facility] Available via Epic secure chat 7am-7pm After these hours, please refer to coverage provider listed on [email or web address] 04/16/2023, 2:49 PM

## 2023-04-16 NOTE — TOC Progression Note (Addendum)
Transition of Care [facility]) - Progression Note    Patient Details  Name: Nancy [name] MRN: [id number] Date of Birth: Mar 01, 1946  [facility] CM/SW Contact  Erin Sons, [place] Phone Number: 04/16/2023, 2:00 PM  Clinical Narrative:     CSW faxed psych note to memory care.   0930: CSW called Memory Care and spoke with nursing director Mariah. She inquired about pt's mobility and explained they cannot have pt return with level of assistance. They would require pt to at least be able to ambulate and transfer with no more than +1 assist. CSW explained PT would see pt today and evaluate.   1215: PT assessed pt and pt is +1/+2 min assist for transfering and +2 min assist for ambulating. PT informed CSW that daughter was in room and wanting SNF.   CSW called daughter who confirmed desire for SNF but once notified of current SNF bed offers she is interested in exploring returning to [facility]. Pt also still does not have a qualifying 3 midnight inpatient hospital stay so SNF may be an out of pocket expense.   CSW called Mariah at [facility] and updated on PT session. [facility] would require pt's family to provide temporary care giver at Memory care in order for pt to return.   CSW called and left voicemail with pt's daughter requesting return call to discuss disposition.   1500: Daughter called CSW back. She discussed with [facility] and plans to hire a care giver temporarily at [facility]. CSW informed her of DC tomorrow. Fl2, DC summary, and HH orders to be faxed to [phone]. She will need PTAR for transport   Expected Discharge Plan: Skilled Nursing Facility Barriers to Discharge: Continued Medical Work up, SNF Pending bed offer  Expected Discharge Plan and Services In-house Referral: Clinical Social Work     Living arrangements for the past 2 months: Assisted Living Facility                                       Social Determinants of Health (SDOH)  Interventions SDOH Screenings   Alcohol Screen: Low Risk  (08/02/2018)  Depression (PHQ2-9): Low Risk  (02/20/2020)  Tobacco Use: Low Risk  (01/18/2023)    Readmission Risk Interventions     No data to display
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
## 2023-04-16 NOTE — Progress Notes (Signed)
Physical Therapy Treatment Patient Details Name: Nancy [name] MRN: [id number] DOB: 16-Feb-1946 Today's Date: 04/16/2023   History of Present Illness Pt is a 77 year old female admitted on 04/12/23 after a fall at her ALF resulting in a fracture to L1. Past medical history significant for coronary artery disease, dyslipidemia, hypertension and osteoporosis as well as dementia. Over the last couple weeks she has been having worsening behavioral disturbances with her dementia.    PT Comments    Notable progress today. Pre-medicated with family present during session. Pt pleasant and cooperative with frequent guidance for direction and ample time to move slowly at her own pace for pain control. Performed log roll to get out of bed. Progressed to min assist +1 with transfer and gait using a rolling walker; initially +2 with hand held support from daughter. Back brace donned, comfortably tolerating being OOB. Chair alarm on, family present in room. Still appropriate for SNF level care unless family desire reduced rehab and memory facility can provide adequate level of assistance. Will continue to follow and progress during admission. Patient will benefit from continued inpatient follow up therapy, <3 hours/day   Recommendations for follow up therapy are one component of a multi-disciplinary discharge planning process, led by the attending physician.  Recommendations may be updated based on patient status, additional functional criteria and insurance authorization.  Follow Up Recommendations  Can patient physically be transported by private vehicle: Yes ((if consistent with today's function.))    Assistance Recommended at Discharge Frequent or constant Supervision/Assistance  Patient can return home with the following A lot of help with bathing/dressing/bathroom;Assistance with cooking/housework;Direct supervision/assist for medications management;Direct supervision/assist for financial  management;Assist for transportation;Help with stairs or ramp for entrance;A little help with walking and/or transfers   Equipment Recommendations  Other (comment) (TBD next venue of care; likely RW)    Recommendations for Other Services       Precautions / Restrictions Precautions Precautions: Fall Restrictions Weight Bearing Restrictions: No     Mobility  Bed Mobility Overal bed mobility: Needs Assistance Bed Mobility: Rolling, Sidelying to Sit Rolling: Min assist Sidelying to sit: Min assist       General bed mobility comments: min assist for log roll technique, use of rail with guidance for sequencing, LEs out of bed, and for trunk support to rise to EOB. Pt guarded during transitions. Gradually relaxed after sitting a couple of minutes and donning LSO.    Transfers Overall transfer level: Needs assistance Equipment used: Rolling walker (2 wheels), 2 person hand held assist Transfers: Sit to/from Stand Sit to Stand: Min assist, +2 physical assistance, +2 safety/equipment           General transfer comment: Min assist +2 with hand held support initial stand from bed. Progressed to min assist +1 for stand from bed with RW for support. Cues for technique, needs guidance to sit with verbal and tactile cues.    Ambulation/Gait Ambulation/Gait assistance: Min assist, +2 physical assistance, +2 safety/equipment Gait Distance (Feet): 20 Feet (+10) Assistive device: Rolling walker (2 wheels), 2 person hand held assist Gait Pattern/deviations: Step-through pattern, Decreased stride length, Drifts right/left, Narrow base of support, Shuffle, Leaning posteriorly Gait velocity: decr Gait velocity interpretation: <1.31 ft/sec, indicative of household ambulator   General Gait Details: Min assist +2 hand held support initial bout in room. VC for direction, showing some posterior instability with gait, no overt buckling however. Progressed to single min assist +1 with RW for  support 10 feet in room,  again for balance, and RW guidance, with no buckling. VC for direction and turning. Tactile cues and VC to back up for approach to sit.   Stairs             Wheelchair Mobility    Modified Rankin (Stroke Patients Only)       Balance Overall balance assessment: History of Falls, Needs assistance Sitting-balance support: Single extremity supported, Feet supported Sitting balance-Leahy Scale: Poor     Standing balance support: Single extremity supported, Reliant on assistive device for balance Standing balance-Leahy Scale: Poor Standing balance comment: Can stand with single UE, needs intermittent min assist for posterior LOB.                            Cognition Arousal/Alertness: Awake/alert Behavior During Therapy: WFL for tasks assessed/performed Overall Cognitive Status: History of cognitive impairments - at baseline                                 General Comments: Pleasant and coorperative with guidance today. Both daughters present.        Exercises      General Comments General comments (skin integrity, edema, etc.): Family present, very supportive.      Pertinent Vitals/Pain Pain Assessment Pain Assessment: PAINAD Breathing: normal Negative Vocalization: occasional moan/groan, low speech, negative/disapproving quality Facial Expression: sad, frightened, frown Body Language: relaxed Consolability: distracted or reassured by voice/touch PAINAD Score: 3 Pain Location: n/a Pain Descriptors / Indicators: Grimacing, Guarding Pain Intervention(s): Limited activity within patient's tolerance, Monitored during session, Premedicated before session, Repositioned, RN gave pain meds during session    Home Living                          Prior Function            PT Goals (current goals can now be found in the care plan section) Acute Rehab PT Goals Patient Stated Goal: none; family want SNF for  Rehab PT Goal Formulation: Patient unable to participate in goal setting Time For Goal Achievement: 04/27/23 Potential to Achieve Goals: Fair Progress towards PT goals: Progressing toward goals    Frequency    Min 2X/week      PT Plan Current plan remains appropriate    Co-evaluation              AM-PAC PT "6 Clicks" Mobility   Outcome Measure  Help needed turning from your back to your side while in a flat bed without using bedrails?: A Little Help needed moving from lying on your back to sitting on the side of a flat bed without using bedrails?: A Little Help needed moving to and from a bed to a chair (including a wheelchair)?: A Lot Help needed standing up from a chair using your arms (e.g., wheelchair or bedside chair)?: A Lot Help needed to walk in hospital room?: A Lot Help needed climbing 3-5 steps with a railing? : Total 6 Click Score: 13    End of Session Equipment Utilized During Treatment: Gait belt;Other (comment) (back brace) Activity Tolerance: Patient tolerated treatment well Patient left: with call bell/phone within reach;in chair;with chair alarm set;with family/visitor present Nurse Communication: Mobility status PT Visit Diagnosis: Other abnormalities of gait and mobility (R26.89);History of falling (Z91.81);Repeated falls (R29.6);Unsteadiness on feet (R26.81);Muscle weakness (generalized) (M62.81);Difficulty in walking, not elsewhere classified (R26.2);Other  symptoms and signs involving the nervous system (R29.898);Pain Pain - part of body:  (back)     Time: 1129-1150 PT Time Calculation (min) (ACUTE ONLY): 21 min  Charges:  $Gait Training: 8-22 mins                     Kathlyn Sacramento, PT, DPT [facility]  Rehabilitation Services Physical Therapist Office: [phone] Website: [email or web address]    Berton Mount 04/16/2023, 12:10 PM

## 2023-04-16 NOTE — Evaluation (Signed)
Occupational Therapy Evaluation Patient Details Name: Nancy [name] MRN: [id number] DOB: 1946-07-21 Today's Date: 04/16/2023   History of Present Illness Pt is a 77 year old female admitted on 04/12/23 after a fall at her ALF resulting in a fracture to L1. Past medical history significant for coronary artery disease, dyslipidemia, hypertension and osteoporosis as well as dementia. Over the last couple weeks she has been having worsening behavioral disturbances with her dementia.   Clinical Impression   Pt admitted for above dx with a hx of falls, per pt daughter she was ind in mobility and bADLs with setup assist. Pt currently limited by back pain and resistive to mobility. Per daughter report she was more functional, alert, and cognizant earlier but she did receive medications. Educated pt on log roll for comfort, attempted to continue back precaution education with other bADLs but pt declining and resistive. Pt needing Mod A for bed mobility, eval today very limited. Pt would benefit from continued acute skilled OT services to address deficits and teach pt compensatory strategies. DC recs are pending at this time to fully assess patient functioning, but based on PT notes and today's eval Pt may be able to DC home if procuring PCA, if not then DC recs will be set for SNF     Recommendations for follow up therapy are one component of a multi-disciplinary discharge planning process, led by the attending physician.  Recommendations may be updated based on patient status, additional functional criteria and insurance authorization.   Assistance Recommended at Discharge Frequent or constant Supervision/Assistance  Patient can return home with the following A lot of help with walking and/or transfers;A lot of help with bathing/dressing/bathroom;Assistance with cooking/housework;Direct supervision/assist for financial management;Direct supervision/assist for medications management;Assist for  transportation;Help with stairs or ramp for entrance    Functional Status Assessment  Patient has had a recent decline in their functional status and demonstrates the ability to make significant improvements in function in a reasonable and predictable amount of time.  Equipment Recommendations  None recommended by OT (TBD at next level of care)    Recommendations for Other Services       Precautions / Restrictions Precautions Precautions: Fall Required Braces or Orthoses: Spinal Brace Spinal Brace: Thoracolumbosacral orthotic Restrictions Weight Bearing Restrictions: No      Mobility Bed Mobility Overal bed mobility: Needs Assistance Bed Mobility: Rolling Rolling: Mod assist Sidelying to sit: Mod assist       General bed mobility comments: Edcuated pt on log roll technique with assist for sequencing    Transfers                   General transfer comment: defer      Balance Overall balance assessment: History of Falls, Needs assistance Sitting-balance support: Single extremity supported, Feet supported Sitting balance-Leahy Scale: Poor         Standing balance comment: defer                           ADL either performed or assessed with clinical judgement   ADL Overall ADL's : Needs assistance/impaired Eating/Feeding: Independent;Sitting   Grooming: Sitting;Supervision/safety;Set up   Upper Body Bathing: Sitting;Set up;Supervision/ safety   Lower Body Bathing: Sitting/lateral leans;Moderate assistance   Upper Body Dressing : Sitting;Supervision/safety;Set up   Lower Body Dressing: Sitting/lateral leans;Moderate assistance     Toilet Transfer Details (indicate cue type and reason): NT   Toileting - Clothing Manipulation Details (indicate cue  type and reason): NT   Tub/Shower Transfer Details (indicate cue type and reason): NT   General ADL Comments: Pt very limited by pain and cognition this session, resisting mobility      Vision         Perception     Praxis      Pertinent Vitals/Pain Pain Assessment Pain Assessment: Faces Faces Pain Scale: Hurts whole lot Pain Location: back and bilat hips Pain Descriptors / Indicators: Grimacing, Guarding, Aching Pain Intervention(s): Monitored during session, Limited activity within patient's tolerance, Repositioned     Hand Dominance Right   Extremity/Trunk Assessment Upper Extremity Assessment Upper Extremity Assessment: Difficult to assess due to impaired cognition   Lower Extremity Assessment Lower Extremity Assessment: Difficult to assess due to impaired cognition       Communication Communication Communication: No difficulties   Cognition Arousal/Alertness: Lethargic Behavior During Therapy: Anxious Overall Cognitive Status: History of cognitive impairments - at baseline                                 General Comments: Pt not commuincating much this session, may be due to lethargy and effects of medication     General Comments  Daughter present, reports pt was much more alert and engaged earlier    Exercises     Shoulder Instructions      Home Living Family/patient expects to be discharged to:: Assisted living                             Home Equipment: Shower seat - built in;Grab bars - tub/shower;Grab bars - toilet (walkin shower with handicap toilets)   Additional Comments: daughter present to give home setup. Daughter reports 2-3 falls in the last year, was having speech therapy PTA. Daughter reports procuring a PCA      Prior Functioning/Environment Prior Level of Function : Independent/Modified Independent             Mobility Comments: Pt states that she walked with her feet and did not use any AD ADLs Comments: ind in dressing/bathing with setup        OT Problem List: Impaired balance (sitting and/or standing);Pain      OT Treatment/Interventions: Self-care/ADL training;DME  and/or AE instruction;Therapeutic exercise;Patient/family education;Therapeutic activities;Balance training    OT Goals(Current goals can be found in the care plan section) Acute Rehab OT Goals Patient Stated Goal: none stated OT Goal Formulation: With patient Time For Goal Achievement: 04/30/23 Potential to Achieve Goals: Fair ADL Goals Pt Will Perform Lower Body Dressing: sitting/lateral leans;with set-up;with supervision Pt Will Transfer to Toilet: ambulating;with min guard assist Additional ADL Goal #1: Pt will demonstrate independent log roll to control back pain with bed mobility  OT Frequency: Min 2X/week    Co-evaluation              AM-PAC OT "6 Clicks" Daily Activity     Outcome Measure Help from another person eating meals?: None Help from another person taking care of personal grooming?: A Little Help from another person toileting, which includes using toliet, bedpan, or urinal?: A Little Help from another person bathing (including washing, rinsing, drying)?: A Lot Help from another person to put on and taking off regular upper body clothing?: A Little Help from another person to put on and taking off regular lower body clothing?: A Lot 6 Click Score: 17  End of Session Nurse Communication: Mobility status  Activity Tolerance: Patient limited by pain;Patient limited by lethargy Patient left: in bed;with call bell/phone within reach;with family/visitor present;with bed alarm set  OT Visit Diagnosis: History of falling (Z91.81);Pain                Time: 1308-6578 OT Time Calculation (min): 17 min Charges:  OT General Charges $OT Visit: 1 Visit OT Evaluation $OT Eval Moderate Complexity: 1 Mod  04/16/2023  AB, OTR/L  Acute Rehabilitation Services  Office: [phone]   Tristan Schroeder 04/16/2023, 5:45 PM

## 2023-04-17 DIAGNOSIS — I2510 Atherosclerotic heart disease of native coronary artery without angina pectoris: Secondary | ICD-10-CM | POA: Diagnosis not present

## 2023-04-17 DIAGNOSIS — S32019D Unspecified fracture of first lumbar vertebra, subsequent encounter for fracture with routine healing: Secondary | ICD-10-CM | POA: Diagnosis not present

## 2023-04-17 DIAGNOSIS — M545 Low back pain, unspecified: Secondary | ICD-10-CM | POA: Diagnosis not present

## 2023-04-17 DIAGNOSIS — F03918 Unspecified dementia, unspecified severity, with other behavioral disturbance: Secondary | ICD-10-CM | POA: Diagnosis not present

## 2023-04-17 MED ORDER — LIDOCAINE 5 % EX PTCH: 1.0000 | MEDICATED_PATCH | CUTANEOUS | 0 refills | Status: AC

## 2023-04-17 MED ORDER — HALOPERIDOL 1 MG PO TABS: 1.0000 mg | ORAL_TABLET | Freq: Four times a day (QID) | ORAL | 0 refills | Status: AC | PRN

## 2023-04-17 MED ORDER — ONDANSETRON HCL 4 MG PO TABS: 4.0000 mg | ORAL_TABLET | Freq: Four times a day (QID) | ORAL | 0 refills | Status: AC | PRN

## 2023-04-17 MED ORDER — DIVALPROEX SODIUM 125 MG PO CSDR: 125.0000 mg | DELAYED_RELEASE_CAPSULE | Freq: Three times a day (TID) | ORAL | 0 refills | Status: AC

## 2023-04-17 MED ORDER — TRAMADOL HCL 50 MG PO TABS: 50.0000 mg | ORAL_TABLET | Freq: Four times a day (QID) | ORAL | 0 refills | Status: AC | PRN

## 2023-04-17 NOTE — Progress Notes (Signed)
    Durable Medical Equipment  (From admission, onward)           Start     Ordered   04/17/23 1449  For home use only DME Walker rolling  Once       Question Answer Comment  Walker: With 5 Inch Wheels   Patient needs a walker to treat with the following condition Gait instability      04/17/23 1448

## 2023-04-17 NOTE — Progress Notes (Signed)
Occupational Therapy Treatment Patient Details Name: Nancy [name] MRN: [id number] DOB: 10-17-1946 Today's Date: 04/17/2023   History of present illness Pt is a 77 year old female admitted on 04/12/23 after a fall at her ALF resulting in a fracture to L1. Past medical history significant for coronary artery disease, dyslipidemia, hypertension and osteoporosis as well as dementia. Over the last couple weeks she has been having worsening behavioral disturbances with her dementia.   OT comments  Pt demonstrating improved cognition in today's OT session. Pt needing Mod A for transfers but Min guard for ambulation. Pt now going to DC back to ALF with PCA due to lack of satisfaction with SNF options. Pt continuing to need more support due to impaired cognition and back pain. OT to continue to progress pt while they stay in acute care, DC plans remain appropriate for SNF but pt would be able to safely return home with PCA and 24/7 supervision.   Recommendations for follow up therapy are one component of a multi-disciplinary discharge planning process, led by the attending physician.  Recommendations may be updated based on patient status, additional functional criteria and insurance authorization.    Assistance Recommended at Discharge Frequent or constant Supervision/Assistance  Patient can return home with the following  A lot of help with bathing/dressing/bathroom;Assistance with cooking/housework;Direct supervision/assist for financial management;Direct supervision/assist for medications management;Assist for transportation;Help with stairs or ramp for entrance;A little help with walking and/or transfers   Equipment Recommendations  Other (comment);BSC/3in1 (RW)    Recommendations for Other Services      Precautions / Restrictions Precautions Precautions: Fall Required Braces or Orthoses: Spinal Brace Spinal Brace: Thoracolumbosacral orthotic Restrictions Weight Bearing Restrictions: No        Mobility Bed Mobility               General bed mobility comments: Pt rec'd from [employer] and left sitting in recliner    Transfers Overall transfer level: Needs assistance   Transfers: Sit to/from Stand Sit to Stand: Mod assist                 Balance Overall balance assessment: History of Falls, Needs assistance     Sitting balance - Comments: in recliner, not able to fully assess   Standing balance support: Single extremity supported, Reliant on assistive device for balance, During functional activity Standing balance-Leahy Scale: Poor                             ADL either performed or assessed with clinical judgement   ADL                           Toilet Transfer: Min guard;Rolling walker (2 wheels);Ambulation           Functional mobility during ADLs: Min guard;Rolling walker (2 wheels)      Extremity/Trunk Assessment Upper Extremity Assessment Upper Extremity Assessment: Generalized weakness (ROM WFL)            Vision       Perception     Praxis      Cognition Arousal/Alertness: Awake/alert Behavior During Therapy: WFL for tasks assessed/performed Overall Cognitive Status: History of cognitive impairments - at baseline                                 General Comments: Pt more  alert and engaged in this session, being jocile in conversations as well.        Exercises      Shoulder Instructions       General Comments Daughter Nancy [name] present    Pertinent Vitals/ Pain       Pain Assessment Pain Assessment: Faces Faces Pain Scale: Hurts little more Pain Location: back Pain Descriptors / Indicators: Grimacing, Guarding, Aching Pain Intervention(s): Monitored during session, Repositioned  Home Living                                          Prior Functioning/Environment              Frequency  Min 2X/week        Progress Toward Goals  OT Goals(current  goals can now be found in the care plan section)  Progress towards OT goals: Progressing toward goals  Acute Rehab OT Goals Patient Stated Goal: none stated OT Goal Formulation: With patient Time For Goal Achievement: 04/30/23 Potential to Achieve Goals: Fair  Plan Frequency remains appropriate;Discharge plan remains appropriate    Co-evaluation                 AM-PAC OT "6 Clicks" Daily Activity     Outcome Measure   Help from another person eating meals?: None Help from another person taking care of personal grooming?: A Little Help from another person toileting, which includes using toliet, bedpan, or urinal?: A Little Help from another person bathing (including washing, rinsing, drying)?: A Lot Help from another person to put on and taking off regular upper body clothing?: A Little Help from another person to put on and taking off regular lower body clothing?: A Lot 6 Click Score: 17    End of Session Equipment Utilized During Treatment: Rolling walker (2 wheels)  OT Visit Diagnosis: History of falling (Z91.81);Pain   Activity Tolerance Patient tolerated treatment well   Patient Left in chair;with call bell/phone within reach;with chair alarm set;with family/visitor present   Nurse Communication Mobility status        Time: 1610-9604 OT Time Calculation (min): 22 min  Charges: OT Treatments $Therapeutic Activity: 8-22 mins  04/17/2023  AB, OTR/L  Acute Rehabilitation Services  Office: [phone]   Tristan Schroeder 04/17/2023, 12:40 PM

## 2023-04-17 NOTE — Discharge Summary (Signed)
Physician Discharge Summary   Patient: Nancy [name] MRN: [id number] DOB: [birth year]  Admit date:     04/12/2023  Discharge date: 04/17/23  Discharge Physician: Marguerita Merles, DO   PCP: System, Provider Not In   Recommendations at discharge:   Follow-up with PCP within 1 to 2 weeks and repeat CBC, CMP, mag, Phos within 1 week Follow-up with neurosurgery in outpatient setting and continue back brace while ambulating and continue physical therapy Follow-up with psychiatry in outpatient setting  Discharge Diagnoses: Principal Problem:   Closed fracture of first lumbar vertebra (HCC) Active Problems:   Dementia with behavioral disturbance (HCC)   Essential hypertension   Coronary artery disease involving native coronary artery of native heart without angina pectoris   Dyslipidemia   Back pain   Dementia with anxiety (HCC)   Polypharmacy  Resolved Problems:   * No resolved hospital problems. [facility] Course: The patient is a 77 year old Caucasian female with past medical history significant for 1 to CAD, dyslipidemia hypertension, osteoporosis as well as dementia and behavioral disturbances presented emergency room with acute onset fall with subsequent back pain.  She has been having behavioral changes with her dementia and yesterday she was kicking spitting and fell and hit her head.  Reportedly over the last few weeks she been having worsening behavioral disturbances and her Xanax was increased up to 3 times a day and which has not helped and BuSpar was added on 04/06/2023.  She presented to the ED for a fall and had nausea and vomiting once in the ED but no fevers or chills.  Further workup was done and she had her head scanned which showed no acute intracranial abnormalities and no acute findings on her cervical spine CT but lumbar spine CT did show an acute transverse fracture of L1 vertebral body and indeterminate likely chronic nondisplaced cortical fracture of the ventral wall of  the L2 vertebral body.  The EDP spoke with neurosurgery who recommended lumbosacral brace and follow-up in 2 weeks in outpatient basis with no surgical intervention necessary.    PT OT recommending SNF.  Her memory care unit is requesting psychiatric evaluation psychiatry has been consulted.  Her pain appears well-controlled resting and worsens with movement and she is awaiting disposition back to SNF or her memory care unit once cleared. Per TOC conversation the wants the daughter to hire private caregiver for the patient to return and if not an SNF would be an option given that she continues to have significant pain.  Pain is much improved today and she is stable for discharge and patient will be going back to her memory care unit with caregiver support and PT OT.  She is medically stable at this time to follow-up with PCP, neurosurgery and psychiatry in outpatient setting.  Assessment and Plan:  * Closed fracture of first lumbar vertebra [facility]) Mechanical Fall -Head and Cervical Spine CT done and showed "No acute intracranial pathology. No acute fracture or traumatic malalignment of the cervical spine." -CT Lumbar Spine done and showed " Acute transverse fracture through the L1 vertebral body. Indeterminate, likely chronic, nondisplaced cortical fracture of the ventral wall of the L2 vertebral body. Chronic vertebral body height loss at the L3 level with associated inferior endplate Schmorl node. Diffusely decreased bone density. Other imaging findings of potential clinical significance: Colonic diverticulosis. Aortic Atherosclerosis."  -This is secondary to mechanical fall. -The patient will be admitted to the medical observation bed but was changed to Inpatient after discussion with  UR Team -Pain management will be provided. -EDP obtained phone consultation with neurosurgery was obtained the recommendation was for lumbosacral brace and follow-up in 2 weeks on outpatient basis. -PT consult will be  obtained to assist with ambulation and recommending SNF -Will follow neurochecks given her head injury. -UA Negative for UTI x-ray done and CXR showed "No evidence of acute airspace opacity, although patient leftward rotation somewhat limits evaluation." -Patient is Afebrile and has no Leukocytosis -She is continuing to have some pain so we will add p.o. Tramadol 50 mg q6hprn and a Lidocaine Patch   Dementia with Behavioral Disturbance (HCC) -We will place on as needed IM Haldol and Psychiatry added a po Option -We will continue Donepezil 10 mg po Daily, Memantine 10 mg po Daily,  -Divalproex 250 mg po Daily was changed to ER 500 mg at bedtime by Psychiatry and now changed to Depakote 125 mg sprinkles 3 times daily; -Continue escitalopram 10 mg po Daily,  -Buspirone 15 mg po BID now stopped by Psychiatry -Benzodiazepines have been stopped -Her PRN trazodone and melatonin were discontinued by Psychiatry (hasn't been taking this) -Follow-up with psychiatry in outpatient setting   Essential Hypertension -We will can continue Metoprolol Succinate 25 mg po Daily -Continue to Monitor BP per Protocol -Last BP reading was 147/69   Coronary artery disease involving native coronary artery of native heart without angina pectoris -We will continue as needed Sublingual Nitroglycerin 0.4 mg SL q5hminPRN, Toprol-XL as above and aspirin.   Dyslipidemia -She is on Repatha subcutaneous injections.  Hypophosphatemia -Phos Level Trend: Recent Labs  Lab 04/15/23 0427  PHOS 2.4*  -Replete with po K Phos Neutral 500 mg po BID yesterday  Renal Insufficiency, improved -BUN/Cr Trend: Recent Labs  Lab 04/12/23 1535 04/13/23 0231 04/14/23 0108 04/15/23 0427  BUN 15 14 7* 8  CREATININE 1.13* 0.97 0.89 0.82  -Patient was getting IVF with NS at 75 mL/hr but will now discontinue -Avoid Nephrotoxic Medications, Contrast Dyes, Hypotension and Dehydration to Ensure Adequate Renal Perfusion and will need  to Renally Adjust Meds -Continue to Monitor and Trend Renal Function carefully and repeat CMP intermittently   Hyperglycemia -Glucose Trend: Recent Labs  Lab 04/12/23 1535 04/13/23 0231 04/14/23 0108 04/15/23 0427  GLUCOSE 105* 148* 101* 104*  -Continue to Monitor and Trend Intermittently   Hypoalbuminemia -Patient's Albumin Trend: Recent Labs  Lab 04/12/23 1535 04/15/23 0427  ALBUMIN 3.7 2.8*  -Continue to Monitor and Trend and repeat CMP intermittently   Consultants: The ED physician discussed with neurosurgery; Psychiatry Procedures performed: As delineated above Disposition:  Memory care unit Diet recommendation:  Cardiac diet DISCHARGE MEDICATION: Allergies as of 04/17/2023       Reactions   Alendronate Shortness Of Breath, Swelling   Atorvastatin Other (See Comments)   Muscle cramps   Bactrim [sulfamethoxazole-trimethoprim] Other (See Comments)   Blisters/skin peeling, nausea   Buprenorphine Hcl Nausea Only   Morphine Nausea Only   OTHER   Morphine And Codeine Nausea Only   Morphine only   Prednisone Other (See Comments)   Unknown   Sulfa Antibiotics Itching   Blisters/skin peeling   Sulfasalazine Itching   Blisters/skin peeling   Sulfonamide Derivatives Other (See Comments)   Blisters/skin peeling        Medication List     STOP taking these medications    ALPRAZolam 0.25 MG tablet Commonly known as: XANAX   busPIRone 15 MG tablet Commonly known as: BUSPAR   divalproex 125 MG DR tablet Commonly known  as: DEPAKOTE Replaced by: divalproex 125 MG capsule       TAKE these medications    acetaminophen 500 MG tablet Commonly known as: TYLENOL Take 500 mg by mouth every 8 (eight) hours as needed for mild pain or fever.   aspirin 81 MG tablet Take 81 mg by mouth daily.   Calcium 600+D3 Plus Minerals 600-800 MG-UNIT Tabs Take 1 tablet by mouth daily.   divalproex 125 MG capsule Commonly known as: DEPAKOTE SPRINKLE Take 1 capsule (125  mg total) by mouth 3 (three) times daily with meals. Replaces: divalproex 125 MG DR tablet   donepezil 10 MG tablet Commonly known as: ARICEPT Take 1 tablet (10 mg total) by mouth daily.   escitalopram 10 MG tablet Commonly known as: LEXAPRO Take 10 mg by mouth daily. Pt takes 1 tablet daily.   haloperidol 1 MG tablet Commonly known as: HALDOL Take 1 tablet (1 mg total) by mouth every 6 (six) hours as needed for agitation.   lidocaine 5 % Commonly known as: LIDODERM Place 1 patch onto the skin daily. Remove & Discard patch within 12 hours or as directed by MD Start taking on: April 18, 2023   melatonin 3 MG Tabs tablet Take 3 mg by mouth at bedtime. Pt takes 1 tablet at night.   memantine 10 MG tablet Commonly known as: NAMENDA TAKE 1 TABLET TWICE  A  DAY What changed:  how much to take how to take this when to take this additional instructions   metoprolol succinate 25 MG 24 hr tablet Commonly known as: TOPROL-XL Take 1 tablet by mouth once daily   nitroGLYCERIN 0.4 MG SL tablet Commonly known as: NITROSTAT Place 1 tablet (0.4 mg total) under the tongue every 5 (five) minutes as needed for chest pain. Please make overdue appt with Dr. Elease Hashimoto before anymore refills. Thank you 1st attempt   ondansetron 4 MG tablet Commonly known as: ZOFRAN Take 1 tablet (4 mg total) by mouth every 6 (six) hours as needed for nausea.   Repatha SureClick 140 MG/ML Soaj Generic drug: Evolocumab INJECT 1 PEN SUBCUTANEOUSLY EVERY TWO WEEKS (EVERY 14(FOURTEEN) DAYS) What changed: See the new instructions.   traMADol 50 MG tablet Commonly known as: ULTRAM Take 1 tablet (50 mg total) by mouth every 6 (six) hours as needed for moderate pain.        Follow-up Information     Your Primary Care Physician. Call in 1 day.          [facility] .   Specialty: Emergency Medicine Why: If symptoms worsen Contact information: [address] [id number] mc [place] [address] [phone]        [facility] Follow up.   Specialty: Assisted Living Facility Contact information: [address] [place] [address] [phone]               Discharge Exam: There were no vitals filed for this visit. Vitals:   04/17/23 0550 04/17/23 0747  BP: 139/74 (!) 159/67  Pulse: 65 63  Resp: 17 16  Temp: 98.9 F (37.2 C) 98.3 F (36.8 C)  SpO2: 97% 95%   Examination: Physical Exam:  Constitutional: WN/WD Caucasian female in no acute distress sitting in the chair at bedside Respiratory: Diminished to auscultation bilaterally, no wheezing, rales, rhonchi or crackles. Normal respiratory effort and patient is not tachypenic. No accessory muscle use.  Unlabored breathing Cardiovascular: RRR, no murmurs /  rubs / gallops. S1 and S2 auscultated. No extremity edema. Abdomen: Soft, non-tender, non-distended. Bowel sounds positive.  GU: Deferred. Musculoskeletal: No clubbing / cyanosis of digits/nails. No joint deformity upper and lower extremities.   Skin: No rashes, lesions, ulcers on limited skin evaluation. No induration; Warm and dry.  Neurologic: CN 2-12 grossly intact with no focal deficits. Romberg sign and cerebellar reflexes not assessed.  Psychiatric: She is more awake and alert today.  Condition at discharge: stable  The results of significant diagnostics from this hospitalization (including imaging, microbiology, ancillary and laboratory) are listed below for reference.   Imaging Studies: DG CHEST PORT 1 VIEW  Result Date: 04/13/2023 CLINICAL DATA:  Altered mental status EXAM: PORTABLE CHEST 1 VIEW COMPARISON:  Chest x-ray dated April 12, 2023 FINDINGS: Leftward patient rotation somewhat limits evaluation. Cardiac and mediastinal contours are unchanged post median sternotomy and CABG. Mild left basilar opacities which are unchanged when compared with the prior  exam, likely due to scarring. Lungs are otherwise clear. No evidence of pleural effusion or pneumothorax. IMPRESSION: No evidence of acute airspace opacity, although patient leftward rotation somewhat limits evaluation. Electronically Signed   By: Allegra Lai M.D.   On: 04/13/2023 16:27   CT Lumbar Spine Wo Contrast  Result Date: 04/12/2023 CLINICAL DATA:  Back trauma, no prior imaging (Age >= 16y) EXAM: CT LUMBAR SPINE WITHOUT CONTRAST TECHNIQUE: Multidetector CT imaging of the lumbar spine was performed without intravenous contrast administration. Multiplanar CT image reconstructions were also generated. RADIATION DOSE REDUCTION: This exam was performed according to the departmental dose-optimization program which includes automated exposure control, adjustment of the mA and/or kV according to patient size and/or use of iterative reconstruction technique. COMPARISON:  X-ray lumbar spine 04/12/2023, MRI lumbar spine 11/07/14, CT abdomen pelvis 10/31/2017 FINDINGS: Segmentation: 5 lumbar type vertebrae. Alignment: Grade 1 anterolisthesis of L3 on L4. Vertebrae: Diffusely decreased bone density. Left L5-S1 pseudoarthrosis. Chronic mild vertebral body height loss at the L3 level with associated inferior endplate Schmorl node. Acute transverse fracture through the L1 vertebral body. Indeterminate, likely chronic, nondisplaced cortical fracture of the ventral wall of the L2 vertebral body. Paraspinal and other soft tissues: Negative. Disc levels: Multilevel intervertebral disc space vacuum phenomenon. Other: Atherosclerotic plaque.  Colonic diverticulosis. IMPRESSION: 1. Acute transverse fracture through the L1 vertebral body. 2. Indeterminate, likely chronic, nondisplaced cortical fracture of the ventral wall of the L2 vertebral body. 3. Chronic vertebral body height loss at the L3 level with associated inferior endplate Schmorl node. 4. Diffusely decreased bone density. 5. Other imaging findings of potential  clinical significance: Colonic diverticulosis. Aortic Atherosclerosis (ICD10-I70.0). Electronically Signed   By: Tish Frederickson M.D.   On: 04/12/2023 19:27   DG Pelvis 1-2 Views  Result Date: 04/12/2023 CLINICAL DATA:  Trauma, fall EXAM: PELVIS - 1-2 VIEW COMPARISON:  None Available. FINDINGS: No displaced fracture is seen. Joint spaces in both hips appear symmetrical. SI joints are unremarkable. Degenerative changes are noted in the visualized lower lumbar spine. IMPRESSION: No recent fracture is seen in pelvis. Electronically Signed   By: Ernie Avena M.D.   On: 04/12/2023 16:40   DG Lumbar Spine Complete  Result Date: 04/12/2023 CLINICAL DATA:  Trauma, fall EXAM: LUMBAR SPINE - COMPLETE 4+ VIEW COMPARISON:  MRI lumbar spine done on 11/07/2014 FINDINGS: There is decrease in height of the body of the L3 vertebra which has not changed since 11/07/2014. These mild decrease in height of upper endplate of body the L2 vertebra which may suggest recent  or old mild compression. Degenerative changes are noted with disc space narrowing and bony spurs at multiple levels, more so at L4-L5 and L5-S1 levels. Osteopenia is seen in bony structures. Calcifications are noted in aorta and its major branches. IMPRESSION: There is mild 10-20% decrease in height of body of L2 vertebra which was not seen in the previous MRI done on 11/07/2014. This may suggest recent or old mild compression fracture. If there are focal symptoms, follow-up CT or MRI may be considered. Old compression fracture in the body of L3 vertebra has not changed since 11/07/2014. Lumbar spondylosis, more severe at L4-L5 and L5-S1 levels. Arteriosclerosis. Electronically Signed   By: Ernie Avena M.D.   On: 04/12/2023 16:39   DG Hand Complete Left  Result Date: 04/12/2023 CLINICAL DATA:  Trauma, fall EXAM: LEFT HAND - COMPLETE 3+ VIEW COMPARISON:  None Available. FINDINGS: No displaced fracture or dislocation is seen. There is a monitoring  device partly obscuring the middle and distal phalanges of the index finger. There is metallic ring partly obscuring the proximal phalanx of ring finger. Marked degenerative changes are noted in first metatarsophalangeal joint. Chondrocalcinosis in the left wrist may be due to degenerative arthritis. Bony spurs are seen in interphalangeal joints. IMPRESSION: No recent displaced fracture or dislocation is seen. Degenerative changes are noted in multiple joints, more severe in the first carpometacarpal joint. Other findings as described in the body of the report. Electronically Signed   By: Ernie Avena M.D.   On: 04/12/2023 16:33   DG Chest 1 View  Result Date: 04/12/2023 CLINICAL DATA:  Trauma, fall EXAM: CHEST  1 VIEW COMPARISON:  08/24/2022 FINDINGS: Cardiac size is within normal limits. There is previous coronary bypass surgery. There are no signs of pulmonary edema or focal pulmonary consolidation. Small linear densities in medial left lower lung field may suggest scarring. Low position of the diaphragms suggest possible COPD. IMPRESSION: No active cardiopulmonary disease. Electronically Signed   By: Ernie Avena M.D.   On: 04/12/2023 16:32   CT Head Wo Contrast  Result Date: 04/12/2023 CLINICAL DATA:  Fall EXAM: CT HEAD WITHOUT CONTRAST CT CERVICAL SPINE WITHOUT CONTRAST TECHNIQUE: Multidetector CT imaging of the head and cervical spine was performed following the standard protocol without intravenous contrast. Multiplanar CT image reconstructions of the cervical spine were also generated. RADIATION DOSE REDUCTION: This exam was performed according to the departmental dose-optimization program which includes automated exposure control, adjustment of the mA and/or kV according to patient size and/or use of iterative reconstruction technique. COMPARISON:  CT head 08/24/2022, cervical spine CT 01/02/2019 FINDINGS: CT HEAD FINDINGS Brain: There is no acute intracranial hemorrhage, extra-axial  fluid collection, or acute infarct. There is mild parenchymal volume loss with prominence of the ventricular system and extra-axial CSF spaces. The ventricles are stable in size since 2023 with unchanged dilation of the temporal horns. Gray-white differentiation is preserved. Hypodensity in the supratentorial white matter likely reflects sequela of underlying chronic small-vessel ischemic change The pituitary and suprasellar region are normal. There is no mass lesion. There is no mass effect or midline shift. Vascular: There is calcification of the bilateral carotid siphons and vertebral arteries. Skull: A prior right parietal burr hole is noted. There is no calvarial fracture. Sinuses/Orbits: The paranasal sinuses are clear. The globes and orbits are unremarkable. Other: None. CT CERVICAL SPINE FINDINGS Alignment: Grade 1 anterolisthesis of C3 on C4, C4 on C5, and C7 on T1 is unchanged, likely degenerative in nature. There is no jumped  or perched facet or other evidence of traumatic malalignment. Skull base and vertebrae: Skull base alignment is maintained. Vertebral body heights are preserved. There is no evidence of acute fracture. There is no suspicious osseous lesion. Soft tissues and spinal canal: No prevertebral fluid or swelling. No visible canal hematoma. Disc levels: Disc space narrowing degenerative endplate change C5-C6 and C6-C7 is stable. There is no high-grade spinal canal stenosis. Upper chest: The imaged lung apices are clear. Other: None. IMPRESSION: 1. No acute intracranial pathology. 2. No acute fracture or traumatic malalignment of the cervical spine. Electronically Signed   By: Lesia Hausen M.D.   On: 04/12/2023 16:21   CT Cervical Spine Wo Contrast  Result Date: 04/12/2023 CLINICAL DATA:  Fall EXAM: CT HEAD WITHOUT CONTRAST CT CERVICAL SPINE WITHOUT CONTRAST TECHNIQUE: Multidetector CT imaging of the head and cervical spine was performed following the standard protocol without intravenous  contrast. Multiplanar CT image reconstructions of the cervical spine were also generated. RADIATION DOSE REDUCTION: This exam was performed according to the departmental dose-optimization program which includes automated exposure control, adjustment of the mA and/or kV according to patient size and/or use of iterative reconstruction technique. COMPARISON:  CT head 08/24/2022, cervical spine CT 01/02/2019 FINDINGS: CT HEAD FINDINGS Brain: There is no acute intracranial hemorrhage, extra-axial fluid collection, or acute infarct. There is mild parenchymal volume loss with prominence of the ventricular system and extra-axial CSF spaces. The ventricles are stable in size since 2023 with unchanged dilation of the temporal horns. Gray-white differentiation is preserved. Hypodensity in the supratentorial white matter likely reflects sequela of underlying chronic small-vessel ischemic change The pituitary and suprasellar region are normal. There is no mass lesion. There is no mass effect or midline shift. Vascular: There is calcification of the bilateral carotid siphons and vertebral arteries. Skull: A prior right parietal burr hole is noted. There is no calvarial fracture. Sinuses/Orbits: The paranasal sinuses are clear. The globes and orbits are unremarkable. Other: None. CT CERVICAL SPINE FINDINGS Alignment: Grade 1 anterolisthesis of C3 on C4, C4 on C5, and C7 on T1 is unchanged, likely degenerative in nature. There is no jumped or perched facet or other evidence of traumatic malalignment. Skull base and vertebrae: Skull base alignment is maintained. Vertebral body heights are preserved. There is no evidence of acute fracture. There is no suspicious osseous lesion. Soft tissues and spinal canal: No prevertebral fluid or swelling. No visible canal hematoma. Disc levels: Disc space narrowing degenerative endplate change C5-C6 and C6-C7 is stable. There is no high-grade spinal canal stenosis. Upper chest: The imaged lung  apices are clear. Other: None. IMPRESSION: 1. No acute intracranial pathology. 2. No acute fracture or traumatic malalignment of the cervical spine. Electronically Signed   By: Lesia Hausen M.D.   On: 04/12/2023 16:21    Microbiology: Results for orders placed or performed during the hospital encounter of 08/24/22  Urine Culture     Status: Abnormal   Collection Time: 08/24/22 10:00 AM   Specimen: Urine, Clean Catch  Result Value Ref Range Status   Specimen Description URINE, CLEAN CATCH  Final   Special Requests   Final    NONE Performed at [facility], [address] [place], [place] [address]    Culture >=100,000 COLONIES/mL ESCHERICHIA COLI (A)  Final   Report Status 08/27/2022 FINAL  Final   Organism ID, Bacteria ESCHERICHIA COLI (A)  Final      Susceptibility   Escherichia coli - MIC*    AMPICILLIN  8 SENSITIVE Sensitive     CEFAZOLIN <=4 SENSITIVE Sensitive     CEFEPIME <=0.12 SENSITIVE Sensitive     CEFTRIAXONE <=0.25 SENSITIVE Sensitive     CIPROFLOXACIN <=0.25 SENSITIVE Sensitive     GENTAMICIN <=1 SENSITIVE Sensitive     IMIPENEM <=0.25 SENSITIVE Sensitive     NITROFURANTOIN <=16 SENSITIVE Sensitive     TRIMETH/SULFA <=20 SENSITIVE Sensitive     AMPICILLIN/SULBACTAM 4 SENSITIVE Sensitive     PIP/TAZO <=4 SENSITIVE Sensitive     * >=100,000 COLONIES/mL ESCHERICHIA COLI   Labs: CBC: Recent Labs  Lab 04/12/23 1535 04/13/23 0231 04/14/23 0108 04/15/23 0427  WBC 6.3 9.0 8.1 8.3  NEUTROABS 4.5  --   --  6.3  HGB 13.3 13.3 14.5 14.4  HCT 40.3 39.5 43.0 42.2  MCV 96.4 92.9 94.3 93.4  PLT 216 196 199 193   Basic Metabolic Panel: Recent Labs  Lab 04/12/23 1535 04/13/23 0231 04/14/23 0108 04/15/23 0427  NA 139 140 136 139  K 4.1 3.9 3.6 3.5  CL 105 102 102 106  CO2 25 27 24 25   GLUCOSE 105* 148* 101* 104*  BUN 15 14 7* 8  CREATININE 1.13* 0.97 0.89 0.82  CALCIUM 9.1 9.0 8.3* 8.2*  MG  --   --   --  2.1  PHOS  --   --   --  2.4*   Liver Function  Tests: Recent Labs  Lab 04/12/23 1535 04/15/23 0427  AST 35 18  ALT 31 18  ALKPHOS 52 45  BILITOT 0.5 0.8  PROT 6.3* 5.8*  ALBUMIN 3.7 2.8*   CBG: No results for input(s): "GLUCAP" in the last 168 hours.  Discharge time spent: greater than 30 minutes.  Signed: Marguerita Merles, DO [facility] 04/17/2023

## 2023-04-17 NOTE — [place] FL2 (Signed)
[place] MEDICAID FL2 LEVEL OF CARE FORM     IDENTIFICATION  Patient Name: Nancy [name] Birthdate: February 10, 1946 Sex: female Admission Date (Current Location): 04/12/2023  [facility] and [place] Number:  Producer, television/film/video and Address:  [facility], [address], [place], [place] [address]      Provider Number: [id number]  Attending Physician Name and Address:  Merlene Laughter, DO  Relative Name and Phone Number:  Alexis Goodell daughter, [phone]    Current Level of Care: Hospital Recommended Level of Care: Assisted Living Facility Prior Approval Number:    Date Approved/Denied:   PASRR Number: [id number]  Discharge Plan: Other (Comment) (ALF)    Current Diagnoses: Patient Active Problem List   Diagnosis Date Noted   Dementia with anxiety (HCC) 04/16/2023   Polypharmacy 04/16/2023   Back pain 04/14/2023   Dementia with behavioral disturbance (HCC) 04/13/2023   Essential hypertension 04/13/2023   Dyslipidemia 04/13/2023   Closed fracture of first lumbar vertebra (HCC) 04/13/2023   Sensorineural hearing loss (SNHL) of both ears 02/17/2022   Pain of right middle finger 09/18/2020   Pain of left middle finger 09/18/2020   Late onset Alzheimer's disease with behavioral disturbance (HCC) 07/02/2020   Mild neurocognitive disorder 07/19/2019   Osteoporosis    Gastroesophageal reflux disease without esophagitis 04/19/2017   Globus pharyngeus 04/19/2017   Hoarseness 04/19/2017   Chest pain, unspecified 08/27/2009   Hyperlipidemia 08/01/2009   Essential hypertension, benign 08/01/2009   Coronary artery disease involving native coronary artery of native heart without angina pectoris 08/01/2009    Orientation RESPIRATION BLADDER Height & Weight     Self  Normal Incontinent Weight:   Height:     BEHAVIORAL SYMPTOMS/MOOD NEUROLOGICAL BOWEL NUTRITION STATUS      Continent Diet (see DC summary)  AMBULATORY STATUS COMMUNICATION OF NEEDS  Skin   Extensive Assist Verbally Normal                       Personal Care Assistance Level of Assistance  Bathing, Feeding, Dressing Bathing Assistance: Maximum assistance Feeding assistance: Independent Dressing Assistance: Maximum assistance     Functional Limitations Info  Sight, Hearing, Speech Sight Info: Adequate Hearing Info: Impaired Speech Info: Adequate    SPECIAL CARE FACTORS FREQUENCY  PT (By licensed PT), OT (By licensed OT)     PT Frequency: 3x a week OT Frequency: 3x a week            Contractures Contractures Info: Not present    Additional Factors Info  Code Status, Allergies Code Status Info: DNR Allergies Info: Alendronate  Atorvastatin  Bactrim (Sulfamethoxazole-trimethoprim)  Buprenorphine Hcl  Morphine  Morphine And Codeine  Prednisone  Sulfa Antibiotics  Sulfasalazine  Sulfonamide Derivatives           Current Medications (04/17/2023):  This is the current hospital active medication list Current Facility-Administered Medications  Medication Dose Route Frequency Provider Last Rate Last Admin   acetaminophen (TYLENOL) tablet 650 mg  650 mg Oral Q6H PRN Mansy, Jan A, MD   650 mg at 04/13/23 8469   Or   acetaminophen (TYLENOL) suppository 650 mg  650 mg Rectal Q6H PRN Mansy, Jan A, MD       acetaminophen (TYLENOL) tablet 500 mg  500 mg Oral TID Rodolph Bong, MD   500 mg at 04/17/23 1039   calcium-vitamin D (OSCAL WITH D) 500-5 MG-MCG per tablet 1 tablet  1 tablet Oral Daily  Mansy, Vernetta Honey, MD   1 tablet at 04/17/23 1039   divalproex (DEPAKOTE SPRINKLE) capsule 125 mg  125 mg Oral TID WC Carrion-Carrero, Margely, MD   125 mg at 04/17/23 1039   donepezil (ARICEPT) tablet 10 mg  10 mg Oral Daily Mansy, Jan A, MD   10 mg at 04/17/23 1039   escitalopram (LEXAPRO) tablet 10 mg  10 mg Oral Daily Mansy, Jan A, MD   10 mg at 04/17/23 1039   haloperidol (HALDOL) tablet 1 mg  1 mg Oral Q6H PRN Carrion-Carrero, Margely, MD       Or   haloperidol  lactate (HALDOL) injection 1 mg  1 mg Intramuscular Q6H PRN Carrion-Carrero, Margely, MD       lidocaine (LIDODERM) 5 % 1 patch  1 patch Transdermal Q24H Sheikh, Kateri Mc Rockford, DO   1 patch at 04/17/23 1040   magnesium hydroxide (MILK OF MAGNESIA) suspension 30 mL  30 mL Oral Daily PRN Mansy, Jan A, MD       melatonin tablet 3 mg  3 mg Oral QHS PRN Carrion-Carrero, Karle Starch, MD       memantine [facility]) tablet 10 mg  10 mg Oral Daily Mansy, Jan A, MD   10 mg at 04/17/23 1039   metoprolol succinate (TOPROL-XL) 24 hr tablet 25 mg  25 mg Oral Daily Mansy, Jan A, MD   25 mg at 04/17/23 1039   nitroGLYCERIN (NITROSTAT) SL tablet 0.4 mg  0.4 mg Sublingual Q5 min PRN Mansy, Jan A, MD       ondansetron [facility]) tablet 4 mg  4 mg Oral Q6H PRN Mansy, Jan A, MD       Or   ondansetron [facility]) injection 4 mg  4 mg Intravenous Q6H PRN Mansy, Jan A, MD       traMADol Janean Sark) tablet 50 mg  50 mg Oral Q6H PRN Sheikh, Omair Latif, DO   50 mg at 04/17/23 1051     Discharge Medications: Please see discharge summary for a list of discharge medications.  Relevant Imaging Results:  Relevant Lab Results:   Additional Information SSN [id number], [place]

## 2023-04-17 NOTE — Progress Notes (Signed)
Attempted to call for report to RN at [facility] on [address] but no answer.

## 2023-04-17 NOTE — Progress Notes (Signed)
    Durable Medical Equipment  (From admission, onward)           Start     Ordered   04/17/23 1501  For home use only DME Bedside commode  Once       Comments: Pt's generalized weakness and decreased activity tolerance necessitate recommendation for bedside commode as she is not able to ambulate to the bathroom  Question:  Patient needs a bedside commode to treat with the following condition  Answer:  Weakness   04/17/23 1501   04/17/23 1449  For home use only DME Walker rolling  Once       Question Answer Comment  Walker: With 5 Inch Wheels   Patient needs a walker to treat with the following condition Gait instability      04/17/23 1448

## 2023-04-17 NOTE — TOC Transition Note (Signed)
Transition of Care [facility]) - CM/SW Discharge Note   Patient Details  Name: Nancy [name] MRN: [id number] Date of Birth: 02-19-1946  [facility] CM/SW Contact:  Jimmy Picket, LCSW Phone Number: 04/17/2023, 2:56 PM   Clinical Narrative:     Per MD patient ready for DC to [facility]. RN, patient, patient's daughter Rinaldo Cloud, and facility notified of DC. Discharge Summary and FL2 sent to facility. DC packet on chart. Ambulance transport requested for patient.    RN to call report to [phone], ask for [facility].   CSW will sign off for now as social work intervention is no longer needed. Please consult [place] again if new needs arise.   Final next level of care: Assisted Living Barriers to Discharge: Barriers Resolved   Patient Goals and CMS Choice      Discharge Placement                  Patient to be transferred to facility by: [facility] Name of family member notified: Daughter Rinaldo Cloud Patient and family notified of of transfer: 04/17/23  Discharge Plan and Services Additional resources added to the After Visit Summary for   In-house Referral: Clinical Social Work                                   Social Determinants of Health (SDOH) Interventions SDOH Screenings   Alcohol Screen: Low Risk  (08/02/2018)  Depression (PHQ2-9): Low Risk  (02/20/2020)  Tobacco Use: Low Risk  (01/18/2023)     Readmission Risk Interventions     No data to display

## 2023-04-27 ENCOUNTER — Telehealth: Payer: Self-pay | Admitting: Physician Assistant

## 2023-04-27 NOTE — Telephone Encounter (Signed)
Did you want to continue to see this patient.

## 2023-04-27 NOTE — Telephone Encounter (Signed)
Tried to call daughter regarding message lvm for patient call us back.

## 2023-04-27 NOTE — Telephone Encounter (Signed)
Pts daughter is calling in with a concern that the pt is declining more since her hospital visit on 04/13/2023 she would like to know if she should continue to see [facility].  Daughter would like to have a call back.

## 2023-04-28 NOTE — Telephone Encounter (Signed)
Called and spoke with Pam patient daughter  advised her of this.

## 2023-04-30 ENCOUNTER — Telehealth: Payer: Self-pay | Admitting: Cardiovascular Disease

## 2023-04-30 NOTE — Telephone Encounter (Signed)
Pt c/o medication issue:  1. Name of Medication:   Evolocumab (REPATHA SURECLICK) 140 MG/ML SOAJ   2. How are you currently taking this medication (dosage and times per day)?   As prescribed  3. Are you having a reaction (difficulty breathing--STAT)?   4. What is your medication issue?   Caller stated they are following-up on patient's prior authorization for this medication. Caller noted patient ran out of this medication since July 1.

## 2023-05-03 ENCOUNTER — Other Ambulatory Visit (HOSPITAL_COMMUNITY): Payer: Self-pay

## 2023-05-03 ENCOUNTER — Telehealth: Payer: Self-pay

## 2023-05-03 MED ORDER — REPATHA SURECLICK 140 MG/ML ~~LOC~~ SOAJ: 140.0000 mg | SUBCUTANEOUS | 3 refills | Status: DC

## 2023-05-03 NOTE — Telephone Encounter (Signed)
Left message for pt that PA now approved, rx refilled.

## 2023-05-03 NOTE — Telephone Encounter (Signed)
Pharmacy Patient Advocate Encounter   Received notification from CoverMyMeds that prior authorization for REPATHA is required/requested.   Insurance verification completed.   The patient is insured through [facility] .  PA submitted to [facility] via CoverMyMeds Key/confirmation #/EOC [id number] Status is pending

## 2023-05-03 NOTE — Telephone Encounter (Signed)
Pharmacy Patient Advocate Encounter  Received notification from [facility] that Prior Authorization for REPATHA has been APPROVED from 02/02/23 to 05/02/24.Marland Kitchen

## 2023-12-21 ENCOUNTER — Encounter: Payer: Self-pay | Admitting: Cardiovascular Disease

## 2023-12-21 ENCOUNTER — Telehealth: Payer: Self-pay | Admitting: Cardiovascular Disease

## 2023-12-21 NOTE — Telephone Encounter (Signed)
 Daughter Bradly Chris) stated patient now lives at [facility] and had lab work done by [facility] and will be sending lab results via My Chart.  Daughter wants to know if patient should be taken off Repatha and wants advice on next steps.

## 2023-12-23 NOTE — Telephone Encounter (Signed)
 Called and spoke with [name] (daughter) to inform her that Nahser is okay with stopping Repatha. Prescription deactivated at this time. Daughter states should something change in the future with her mom, she'll reach out and let us know. No further questions/concerns.

## 2024-05-19 DEATH — deceased
